# Patient Record
Sex: Female | Born: 1960 | Race: White | Hispanic: No | Marital: Married | Smoking: Former smoker
Health system: Southern US, Community
[De-identification: ages and names within clinical notes are randomized; demographics above are authoritative.]

## PROBLEM LIST (undated history)

## (undated) DIAGNOSIS — R5382 Chronic fatigue, unspecified: Secondary | ICD-10-CM

## (undated) DIAGNOSIS — N63 Unspecified lump in unspecified breast: Secondary | ICD-10-CM

## (undated) DIAGNOSIS — J40 Bronchitis, not specified as acute or chronic: Secondary | ICD-10-CM

## (undated) DIAGNOSIS — K635 Polyp of colon: Secondary | ICD-10-CM

## (undated) DIAGNOSIS — M1990 Unspecified osteoarthritis, unspecified site: Secondary | ICD-10-CM

## (undated) DIAGNOSIS — I341 Nonrheumatic mitral (valve) prolapse: Secondary | ICD-10-CM

## (undated) DIAGNOSIS — Z9989 Dependence on other enabling machines and devices: Secondary | ICD-10-CM

## (undated) DIAGNOSIS — G4733 Obstructive sleep apnea (adult) (pediatric): Secondary | ICD-10-CM

## (undated) DIAGNOSIS — Z923 Personal history of irradiation: Secondary | ICD-10-CM

## (undated) DIAGNOSIS — B269 Mumps without complication: Secondary | ICD-10-CM

## (undated) DIAGNOSIS — R011 Cardiac murmur, unspecified: Secondary | ICD-10-CM

## (undated) DIAGNOSIS — D649 Anemia, unspecified: Secondary | ICD-10-CM

## (undated) DIAGNOSIS — B059 Measles without complication: Secondary | ICD-10-CM

## (undated) DIAGNOSIS — I10 Essential (primary) hypertension: Secondary | ICD-10-CM

## (undated) DIAGNOSIS — IMO0001 Reserved for inherently not codable concepts without codable children: Secondary | ICD-10-CM

## (undated) DIAGNOSIS — K219 Gastro-esophageal reflux disease without esophagitis: Secondary | ICD-10-CM

## (undated) DIAGNOSIS — M797 Fibromyalgia: Secondary | ICD-10-CM

## (undated) DIAGNOSIS — G9332 Myalgic encephalomyelitis/chronic fatigue syndrome: Secondary | ICD-10-CM

## (undated) DIAGNOSIS — C50919 Malignant neoplasm of unspecified site of unspecified female breast: Secondary | ICD-10-CM

## (undated) DIAGNOSIS — C50419 Malignant neoplasm of upper-outer quadrant of unspecified female breast: Secondary | ICD-10-CM

## (undated) DIAGNOSIS — Z9221 Personal history of antineoplastic chemotherapy: Secondary | ICD-10-CM

## (undated) HISTORY — PX: UPPER GI ENDOSCOPY: SHX6162

## (undated) HISTORY — DX: Myalgic encephalomyelitis/chronic fatigue syndrome: G93.32

## (undated) HISTORY — DX: Essential (primary) hypertension: I10

## (undated) HISTORY — DX: Chronic fatigue, unspecified: R53.82

## (undated) HISTORY — DX: Malignant neoplasm of upper-outer quadrant of unspecified female breast: C50.419

## (undated) HISTORY — DX: Obstructive sleep apnea (adult) (pediatric): G47.33

## (undated) HISTORY — DX: Unspecified osteoarthritis, unspecified site: M19.90

## (undated) HISTORY — DX: Unspecified lump in unspecified breast: N63.0

## (undated) HISTORY — DX: Mumps without complication: B26.9

## (undated) HISTORY — DX: Cardiac murmur, unspecified: R01.1

## (undated) HISTORY — DX: Polyp of colon: K63.5

## (undated) HISTORY — DX: Fibromyalgia: M79.7

## (undated) HISTORY — PX: BACK SURGERY: SHX140

## (undated) HISTORY — DX: Dependence on other enabling machines and devices: Z99.89

## (undated) HISTORY — DX: Measles without complication: B05.9

---
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
## 1980-11-24 HISTORY — PX: TUBAL LIGATION: SHX77

## 1988-11-24 DIAGNOSIS — M1990 Unspecified osteoarthritis, unspecified site: Secondary | ICD-10-CM

## 1988-11-24 HISTORY — DX: Unspecified osteoarthritis, unspecified site: M19.90

## 1991-11-25 DIAGNOSIS — M797 Fibromyalgia: Secondary | ICD-10-CM

## 1991-11-25 HISTORY — DX: Fibromyalgia: M79.7

## 2007-04-30 ENCOUNTER — Ambulatory Visit: Payer: Self-pay | Admitting: Internal Medicine

## 2007-10-22 ENCOUNTER — Ambulatory Visit: Payer: Self-pay | Admitting: Internal Medicine

## 2007-11-25 HISTORY — PX: SHOULDER SURGERY: SHX246

## 2008-05-17 ENCOUNTER — Ambulatory Visit: Payer: Self-pay | Admitting: Internal Medicine

## 2008-06-12 ENCOUNTER — Ambulatory Visit: Payer: Self-pay | Admitting: Physician Assistant

## 2008-06-23 ENCOUNTER — Ambulatory Visit: Payer: Self-pay | Admitting: Unknown Physician Specialty

## 2008-12-05 ENCOUNTER — Ambulatory Visit: Payer: Self-pay | Admitting: Adult Health Nurse Practitioner

## 2010-05-23 ENCOUNTER — Ambulatory Visit: Payer: Self-pay | Admitting: Internal Medicine

## 2010-10-18 ENCOUNTER — Ambulatory Visit: Payer: Self-pay

## 2010-11-24 DIAGNOSIS — I10 Essential (primary) hypertension: Secondary | ICD-10-CM

## 2010-11-24 HISTORY — DX: Essential (primary) hypertension: I10

## 2011-10-02 ENCOUNTER — Ambulatory Visit: Payer: Self-pay | Admitting: Family Medicine

## 2011-10-15 ENCOUNTER — Ambulatory Visit: Payer: Self-pay | Admitting: Gastroenterology

## 2011-10-17 LAB — PATHOLOGY REPORT

## 2011-11-25 DIAGNOSIS — K635 Polyp of colon: Secondary | ICD-10-CM

## 2011-11-25 HISTORY — DX: Polyp of colon: K63.5

## 2011-11-25 HISTORY — PX: COLONOSCOPY: SHX174

## 2012-10-24 HISTORY — PX: BREAST BIOPSY: SHX20

## 2012-11-04 ENCOUNTER — Ambulatory Visit: Payer: Self-pay | Admitting: Family Medicine

## 2012-11-24 DIAGNOSIS — C50919 Malignant neoplasm of unspecified site of unspecified female breast: Secondary | ICD-10-CM

## 2012-11-24 HISTORY — PX: BREAST EXCISIONAL BIOPSY: SUR124

## 2012-11-24 HISTORY — DX: Malignant neoplasm of unspecified site of unspecified female breast: C50.919

## 2012-11-25 ENCOUNTER — Ambulatory Visit: Payer: Self-pay | Admitting: Oncology

## 2012-11-25 LAB — COMPREHENSIVE METABOLIC PANEL
Albumin: 3.8 g/dL (ref 3.4–5.0)
Alkaline Phosphatase: 144 U/L — ABNORMAL HIGH (ref 50–136)
BUN: 14 mg/dL (ref 7–18)
Bilirubin,Total: 0.3 mg/dL (ref 0.2–1.0)
Calcium, Total: 9.3 mg/dL (ref 8.5–10.1)
Chloride: 103 mmol/L (ref 98–107)
Co2: 27 mmol/L (ref 21–32)
Creatinine: 0.92 mg/dL (ref 0.60–1.30)
Potassium: 4.2 mmol/L (ref 3.5–5.1)
SGOT(AST): 25 U/L (ref 15–37)
SGPT (ALT): 30 U/L (ref 12–78)
Total Protein: 7.5 g/dL (ref 6.4–8.2)

## 2012-11-25 LAB — CBC CANCER CENTER
Eosinophil #: 0.2 x10 3/mm (ref 0.0–0.7)
HCT: 38.6 % (ref 35.0–47.0)
HGB: 13 g/dL (ref 12.0–16.0)
Lymphocyte #: 1.8 x10 3/mm (ref 1.0–3.6)
MCH: 29.8 pg (ref 26.0–34.0)
MCHC: 33.8 g/dL (ref 32.0–36.0)
Monocyte %: 7.2 %
Platelet: 221 x10 3/mm (ref 150–440)
RBC: 4.37 10*6/uL (ref 3.80–5.20)
RDW: 13.8 % (ref 11.5–14.5)
WBC: 6.5 x10 3/mm (ref 3.6–11.0)

## 2012-11-30 ENCOUNTER — Ambulatory Visit: Payer: Self-pay | Admitting: General Surgery

## 2012-12-06 ENCOUNTER — Ambulatory Visit: Payer: Self-pay | Admitting: General Surgery

## 2012-12-06 HISTORY — PX: BREAST LUMPECTOMY: SHX2

## 2012-12-16 LAB — CBC CANCER CENTER
Basophil #: 0 x10 3/mm (ref 0.0–0.1)
Basophil %: 0.6 %
Eosinophil #: 0.1 x10 3/mm (ref 0.0–0.7)
HCT: 36 % (ref 35.0–47.0)
MCHC: 34 g/dL (ref 32.0–36.0)
MCV: 88 fL (ref 80–100)
Monocyte #: 0.4 x10 3/mm (ref 0.2–0.9)
Neutrophil #: 4.1 x10 3/mm (ref 1.4–6.5)
Neutrophil %: 69.7 %
Platelet: 211 x10 3/mm (ref 150–440)
RDW: 13.7 % (ref 11.5–14.5)
WBC: 5.9 x10 3/mm (ref 3.6–11.0)

## 2012-12-16 LAB — COMPREHENSIVE METABOLIC PANEL
Anion Gap: 6 — ABNORMAL LOW (ref 7–16)
BUN: 16 mg/dL (ref 7–18)
Bilirubin,Total: 0.3 mg/dL (ref 0.2–1.0)
Calcium, Total: 8.6 mg/dL (ref 8.5–10.1)
Chloride: 105 mmol/L (ref 98–107)
Co2: 29 mmol/L (ref 21–32)
Creatinine: 0.93 mg/dL (ref 0.60–1.30)
EGFR (African American): >60
EGFR (Non-African Amer.): >60
Glucose: 117 mg/dL — ABNORMAL HIGH (ref 65–99)
Osmolality: 282 (ref 275–301)
Potassium: 4.2 mmol/L (ref 3.5–5.1)
SGOT(AST): 22 U/L (ref 15–37)
SGPT (ALT): 28 U/L (ref 12–78)
Sodium: 140 mmol/L (ref 136–145)
Total Protein: 7 g/dL (ref 6.4–8.2)

## 2012-12-23 LAB — CBC CANCER CENTER
Eosinophil #: 0.1 x10 3/mm (ref 0.0–0.7)
Lymphocyte #: 0.4 x10 3/mm — ABNORMAL LOW (ref 1.0–3.6)
Lymphocyte %: 68.9 %
MCV: 87 fL (ref 80–100)
Monocyte #: 0.1 x10 3/mm — ABNORMAL LOW (ref 0.2–0.9)
Monocyte %: 15.3 %
Neutrophil #: 0 x10 3/mm — ABNORMAL LOW (ref 1.4–6.5)
RBC: 3.95 10*6/uL (ref 3.80–5.20)

## 2012-12-25 ENCOUNTER — Ambulatory Visit: Payer: Self-pay | Admitting: Oncology

## 2012-12-30 LAB — CBC CANCER CENTER
Basophil #: 0 x10 3/mm (ref 0.0–0.1)
Eosinophil #: 0 x10 3/mm (ref 0.0–0.7)
HCT: 34.5 % — ABNORMAL LOW (ref 35.0–47.0)
HGB: 11.5 g/dL — ABNORMAL LOW (ref 12.0–16.0)
Lymphocyte #: 1.6 x10 3/mm (ref 1.0–3.6)
Lymphocyte %: 18.9 %
MCH: 29.2 pg (ref 26.0–34.0)
MCHC: 33.4 g/dL (ref 32.0–36.0)
MCV: 87 fL (ref 80–100)
Neutrophil #: 6.2 x10 3/mm (ref 1.4–6.5)
Neutrophil %: 71.8 %
RBC: 3.95 10*6/uL (ref 3.80–5.20)

## 2013-01-07 LAB — BASIC METABOLIC PANEL
Anion Gap: 11 (ref 7–16)
Calcium, Total: 8.6 mg/dL (ref 8.5–10.1)
Chloride: 101 mmol/L (ref 98–107)
Co2: 27 mmol/L (ref 21–32)
Creatinine: 0.97 mg/dL (ref 0.60–1.30)
EGFR (African American): >60
EGFR (Non-African Amer.): >60
Glucose: 130 mg/dL — ABNORMAL HIGH (ref 65–99)
Osmolality: 280 (ref 275–301)
Potassium: 3.9 mmol/L (ref 3.5–5.1)
Sodium: 139 mmol/L (ref 136–145)

## 2013-01-07 LAB — CBC CANCER CENTER
Basophil #: 0 x10 3/mm (ref 0.0–0.1)
Basophil %: 0.7 %
Lymphocyte #: 1 x10 3/mm (ref 1.0–3.6)
MCH: 29.8 pg (ref 26.0–34.0)
MCHC: 34 g/dL (ref 32.0–36.0)
Monocyte #: 0.7 x10 3/mm (ref 0.2–0.9)
Neutrophil %: 75.6 %
Platelet: 331 x10 3/mm (ref 150–440)
RBC: 3.75 10*6/uL — ABNORMAL LOW (ref 3.80–5.20)
RDW: 14.4 % (ref 11.5–14.5)
WBC: 7.3 x10 3/mm (ref 3.6–11.0)

## 2013-01-17 LAB — CBC CANCER CENTER
Basophil #: 0 x10 3/mm (ref 0.0–0.1)
Basophil %: 1.5 %
Eosinophil #: 0 x10 3/mm (ref 0.0–0.7)
HGB: 10.5 g/dL — ABNORMAL LOW (ref 12.0–16.0)
Lymphocyte %: 74.1 %
Monocyte #: 0 x10 3/mm — ABNORMAL LOW (ref 0.2–0.9)
Neutrophil #: 0 x10 3/mm — ABNORMAL LOW (ref 1.4–6.5)
Neutrophil %: 1.4 %
Platelet: 127 x10 3/mm — ABNORMAL LOW (ref 150–440)
RDW: 14.2 % (ref 11.5–14.5)
WBC: 0.4 x10 3/mm — CL (ref 3.6–11.0)

## 2013-01-21 LAB — CBC CANCER CENTER
Basophil #: 0 x10 3/mm (ref 0.0–0.1)
Eosinophil #: 0 x10 3/mm (ref 0.0–0.7)
HCT: 30.2 % — ABNORMAL LOW (ref 35.0–47.0)
HGB: 10.3 g/dL — ABNORMAL LOW (ref 12.0–16.0)
Lymphocyte #: 1.4 x10 3/mm (ref 1.0–3.6)
Lymphocyte %: 15.8 %
MCH: 29.6 pg (ref 26.0–34.0)
MCHC: 34.1 g/dL (ref 32.0–36.0)
MCV: 87 fL (ref 80–100)
Monocyte #: 0.8 x10 3/mm (ref 0.2–0.9)
Monocyte %: 9.2 %
Neutrophil %: 74.4 %
RBC: 3.47 10*6/uL — ABNORMAL LOW (ref 3.80–5.20)
RDW: 14 % (ref 11.5–14.5)

## 2013-01-22 ENCOUNTER — Ambulatory Visit: Payer: Self-pay | Admitting: Oncology

## 2013-01-27 LAB — CBC CANCER CENTER
Basophil #: 0 x10 3/mm (ref 0.0–0.1)
Basophil %: 0.7 %
Eosinophil %: 0.1 %
HGB: 10.5 g/dL — ABNORMAL LOW (ref 12.0–16.0)
Lymphocyte #: 1 x10 3/mm (ref 1.0–3.6)
Lymphocyte %: 20.7 %
MCH: 29.3 pg (ref 26.0–34.0)
MCV: 88 fL (ref 80–100)
Monocyte %: 12.5 %
Neutrophil #: 3.2 x10 3/mm (ref 1.4–6.5)
Neutrophil %: 66 %
Platelet: 326 x10 3/mm (ref 150–440)
RBC: 3.57 10*6/uL — ABNORMAL LOW (ref 3.80–5.20)
WBC: 4.8 x10 3/mm (ref 3.6–11.0)

## 2013-01-27 LAB — BASIC METABOLIC PANEL
Calcium, Total: 8.7 mg/dL (ref 8.5–10.1)
Chloride: 105 mmol/L (ref 98–107)
Co2: 25 mmol/L (ref 21–32)
Creatinine: 0.81 mg/dL (ref 0.60–1.30)
EGFR (Non-African Amer.): >60
Glucose: 125 mg/dL — ABNORMAL HIGH (ref 65–99)
Osmolality: 278 (ref 275–301)
Sodium: 139 mmol/L (ref 136–145)

## 2013-02-03 LAB — CBC CANCER CENTER
Basophil #: 0 x10 3/mm (ref 0.0–0.1)
Basophil %: 1.3 %
Eosinophil #: 0 x10 3/mm (ref 0.0–0.7)
Eosinophil %: 2.6 %
HCT: 28.2 % — ABNORMAL LOW (ref 35.0–47.0)
HGB: 9.8 g/dL — ABNORMAL LOW (ref 12.0–16.0)
Lymphocyte #: 0.3 x10 3/mm — ABNORMAL LOW (ref 1.0–3.6)
Lymphocyte %: 76.4 %
MCH: 30.2 pg (ref 26.0–34.0)
MCHC: 34.7 g/dL (ref 32.0–36.0)
MCV: 87 fL (ref 80–100)
Monocyte #: 0 x10 3/mm — ABNORMAL LOW (ref 0.2–0.9)
Monocyte %: 14.2 %
Neutrophil #: 0 x10 3/mm — ABNORMAL LOW (ref 1.4–6.5)
Neutrophil %: 5.5 %
Platelet: 105 x10 3/mm — ABNORMAL LOW (ref 150–440)
RBC: 3.24 10*6/uL — ABNORMAL LOW (ref 3.80–5.20)
RDW: 16 % — ABNORMAL HIGH (ref 11.5–14.5)
WBC: 0.3 x10 3/mm — CL (ref 3.6–11.0)

## 2013-02-10 LAB — CBC CANCER CENTER
Basophil #: 0 x10 3/mm (ref 0.0–0.1)
Basophil %: 0.4 %
Eosinophil #: 0 x10 3/mm (ref 0.0–0.7)
HGB: 9.7 g/dL — ABNORMAL LOW (ref 12.0–16.0)
Lymphocyte #: 1.3 x10 3/mm (ref 1.0–3.6)
Lymphocyte %: 11.7 %
MCHC: 34.3 g/dL (ref 32.0–36.0)
Monocyte #: 1.1 x10 3/mm — ABNORMAL HIGH (ref 0.2–0.9)
Neutrophil #: 8.3 x10 3/mm — ABNORMAL HIGH (ref 1.4–6.5)
Neutrophil %: 77.7 %
Platelet: 148 x10 3/mm — ABNORMAL LOW (ref 150–440)
RBC: 3.21 10*6/uL — ABNORMAL LOW (ref 3.80–5.20)
WBC: 10.7 x10 3/mm (ref 3.6–11.0)

## 2013-02-17 LAB — CBC CANCER CENTER
Basophil %: 1 %
Eosinophil %: 0.4 %
HCT: 28.8 % — ABNORMAL LOW (ref 35.0–47.0)
HGB: 10 g/dL — ABNORMAL LOW (ref 12.0–16.0)
Lymphocyte #: 0.7 x10 3/mm — ABNORMAL LOW (ref 1.0–3.6)
MCH: 30.8 pg (ref 26.0–34.0)
MCHC: 34.6 g/dL (ref 32.0–36.0)
MCV: 89 fL (ref 80–100)
Monocyte #: 0.6 x10 3/mm (ref 0.2–0.9)
Neutrophil %: 67.2 %
RBC: 3.23 10*6/uL — ABNORMAL LOW (ref 3.80–5.20)
WBC: 4.2 x10 3/mm (ref 3.6–11.0)

## 2013-02-17 LAB — BASIC METABOLIC PANEL
Anion Gap: 6 — ABNORMAL LOW (ref 7–16)
Co2: 29 mmol/L (ref 21–32)
Creatinine: 0.96 mg/dL (ref 0.60–1.30)
EGFR (African American): >60
Glucose: 96 mg/dL (ref 65–99)
Osmolality: 278 (ref 275–301)
Potassium: 4.2 mmol/L (ref 3.5–5.1)

## 2013-02-22 ENCOUNTER — Ambulatory Visit: Payer: Self-pay | Admitting: Oncology

## 2013-02-24 LAB — CBC CANCER CENTER
Eosinophil #: 0 x10 3/mm (ref 0.0–0.7)
Eosinophil %: 4.4 %
HCT: 25.6 % — ABNORMAL LOW (ref 35.0–47.0)
Lymphocyte %: 66 %
Monocyte #: 0 x10 3/mm — ABNORMAL LOW (ref 0.2–0.9)
Neutrophil #: 0 x10 3/mm — ABNORMAL LOW (ref 1.4–6.5)
Neutrophil %: 6.6 %
Platelet: 83 x10 3/mm — ABNORMAL LOW (ref 150–440)
RBC: 2.84 10*6/uL — ABNORMAL LOW (ref 3.80–5.20)
RDW: 18.6 % — ABNORMAL HIGH (ref 11.5–14.5)
WBC: 0.2 x10 3/mm — CL (ref 3.6–11.0)

## 2013-03-03 LAB — CBC CANCER CENTER
Basophil #: 0 x10 3/mm (ref 0.0–0.1)
Eosinophil %: 0.1 %
HCT: 24.2 % — ABNORMAL LOW (ref 35.0–47.0)
HGB: 8.1 g/dL — ABNORMAL LOW (ref 12.0–16.0)
Lymphocyte %: 9.3 %
MCH: 30.9 pg (ref 26.0–34.0)
MCHC: 33.6 g/dL (ref 32.0–36.0)
MCV: 92 fL (ref 80–100)
Monocyte #: 0.8 x10 3/mm (ref 0.2–0.9)
Neutrophil #: 7 x10 3/mm — ABNORMAL HIGH (ref 1.4–6.5)
Neutrophil %: 80.8 %
Platelet: 147 x10 3/mm — ABNORMAL LOW (ref 150–440)
WBC: 8.6 x10 3/mm (ref 3.6–11.0)

## 2013-03-10 LAB — CBC CANCER CENTER
Basophil #: 0 x10 3/mm (ref 0.0–0.1)
Eosinophil #: 0 x10 3/mm (ref 0.0–0.7)
Eosinophil %: 0.1 %
HGB: 8.8 g/dL — ABNORMAL LOW (ref 12.0–16.0)
Lymphocyte #: 0.5 x10 3/mm — ABNORMAL LOW (ref 1.0–3.6)
Lymphocyte %: 11.2 %
MCH: 31.2 pg (ref 26.0–34.0)
MCHC: 33.3 g/dL (ref 32.0–36.0)
Monocyte #: 0.6 x10 3/mm (ref 0.2–0.9)
Monocyte %: 13.3 %
Neutrophil #: 3.3 x10 3/mm (ref 1.4–6.5)
Neutrophil %: 74.7 %
Platelet: 279 x10 3/mm (ref 150–440)
RBC: 2.81 10*6/uL — ABNORMAL LOW (ref 3.80–5.20)
RDW: 21.4 % — ABNORMAL HIGH (ref 11.5–14.5)

## 2013-03-10 LAB — COMPREHENSIVE METABOLIC PANEL
Albumin: 3.3 g/dL — ABNORMAL LOW (ref 3.4–5.0)
Anion Gap: 9 (ref 7–16)
BUN: 8 mg/dL (ref 7–18)
Bilirubin,Total: 0.3 mg/dL (ref 0.2–1.0)
Chloride: 104 mmol/L (ref 98–107)
Co2: 28 mmol/L (ref 21–32)
Creatinine: 1.09 mg/dL (ref 0.60–1.30)
EGFR (African American): >60
Glucose: 163 mg/dL — ABNORMAL HIGH (ref 65–99)
Osmolality: 283 (ref 275–301)
Potassium: 3.6 mmol/L (ref 3.5–5.1)
Sodium: 141 mmol/L (ref 136–145)
Total Protein: 6.5 g/dL (ref 6.4–8.2)

## 2013-03-17 LAB — IRON AND TIBC
Iron Bind.Cap.(Total): 264 ug/dL (ref 250–450)
Iron Saturation: 25 %
Iron: 67 ug/dL (ref 50–170)
Unbound Iron-Bind.Cap.: 197 ug/dL

## 2013-03-17 LAB — COMPREHENSIVE METABOLIC PANEL
Albumin: 3.2 g/dL — ABNORMAL LOW (ref 3.4–5.0)
Anion Gap: 4 — ABNORMAL LOW (ref 7–16)
BUN: 13 mg/dL (ref 7–18)
Bilirubin,Total: 0.4 mg/dL (ref 0.2–1.0)
Chloride: 104 mmol/L (ref 98–107)
Co2: 28 mmol/L (ref 21–32)
Creatinine: 0.8 mg/dL (ref 0.60–1.30)
EGFR (Non-African Amer.): >60
Glucose: 90 mg/dL (ref 65–99)
Osmolality: 272 (ref 275–301)
Potassium: 3.9 mmol/L (ref 3.5–5.1)
SGPT (ALT): 57 U/L (ref 12–78)
Sodium: 136 mmol/L (ref 136–145)

## 2013-03-17 LAB — CBC CANCER CENTER
Basophil #: 0 x10 3/mm (ref 0.0–0.1)
Basophil %: 0.4 %
Eosinophil #: 0.1 x10 3/mm (ref 0.0–0.7)
Lymphocyte %: 13.1 %
MCHC: 34.4 g/dL (ref 32.0–36.0)
Monocyte #: 0.5 x10 3/mm (ref 0.2–0.9)
Monocyte %: 11 %
Neutrophil #: 3.3 x10 3/mm (ref 1.4–6.5)
Platelet: 235 x10 3/mm (ref 150–440)
WBC: 4.6 x10 3/mm (ref 3.6–11.0)

## 2013-03-17 LAB — FERRITIN: Ferritin (ARMC): 228 ng/mL (ref 8–388)

## 2013-03-24 ENCOUNTER — Ambulatory Visit: Payer: Self-pay | Admitting: Oncology

## 2013-03-24 LAB — CBC CANCER CENTER
Basophil #: 0 x10 3/mm (ref 0.0–0.1)
Eosinophil #: 0.3 x10 3/mm (ref 0.0–0.7)
MCH: 32.2 pg (ref 26.0–34.0)
MCHC: 33.6 g/dL (ref 32.0–36.0)
Monocyte #: 0.4 x10 3/mm (ref 0.2–0.9)
Monocyte %: 8.2 %
Neutrophil #: 3.1 x10 3/mm (ref 1.4–6.5)
Platelet: 212 x10 3/mm (ref 150–440)
RBC: 2.85 10*6/uL — ABNORMAL LOW (ref 3.80–5.20)
RDW: 19.5 % — ABNORMAL HIGH (ref 11.5–14.5)
WBC: 4.3 x10 3/mm (ref 3.6–11.0)

## 2013-03-31 LAB — CBC CANCER CENTER
Basophil %: 0.7 %
Eosinophil #: 0.2 x10 3/mm (ref 0.0–0.7)
Eosinophil %: 5.2 %
HGB: 9.3 g/dL — ABNORMAL LOW (ref 12.0–16.0)
Lymphocyte %: 14.1 %
Monocyte #: 0.4 x10 3/mm (ref 0.2–0.9)
Monocyte %: 9.1 %
Neutrophil #: 2.8 x10 3/mm (ref 1.4–6.5)
Platelet: 220 x10 3/mm (ref 150–440)
RBC: 2.83 10*6/uL — ABNORMAL LOW (ref 3.80–5.20)
RDW: 18.4 % — ABNORMAL HIGH (ref 11.5–14.5)

## 2013-04-07 LAB — CBC CANCER CENTER
Basophil %: 0.8 %
Eosinophil #: 0.1 x10 3/mm (ref 0.0–0.7)
Eosinophil %: 4.1 %
HGB: 9.1 g/dL — ABNORMAL LOW (ref 12.0–16.0)
Lymphocyte #: 0.6 x10 3/mm — ABNORMAL LOW (ref 1.0–3.6)
Lymphocyte %: 16.1 %
MCV: 96 fL (ref 80–100)
Monocyte #: 0.4 x10 3/mm (ref 0.2–0.9)
Monocyte %: 10.1 %
Neutrophil #: 2.4 x10 3/mm (ref 1.4–6.5)
RBC: 2.82 10*6/uL — ABNORMAL LOW (ref 3.80–5.20)
RDW: 17.4 % — ABNORMAL HIGH (ref 11.5–14.5)

## 2013-04-14 LAB — CBC CANCER CENTER
Basophil #: 0 x10 3/mm (ref 0.0–0.1)
Basophil %: 0.7 %
Eosinophil #: 0.1 x10 3/mm (ref 0.0–0.7)
HCT: 29.1 % — ABNORMAL LOW (ref 35.0–47.0)
HGB: 9.8 g/dL — ABNORMAL LOW (ref 12.0–16.0)
MCH: 32.2 pg (ref 26.0–34.0)
MCHC: 33.5 g/dL (ref 32.0–36.0)
Monocyte #: 0.3 x10 3/mm (ref 0.2–0.9)
Neutrophil #: 2.3 x10 3/mm (ref 1.4–6.5)
WBC: 3.3 x10 3/mm — ABNORMAL LOW (ref 3.6–11.0)

## 2013-04-21 LAB — CBC CANCER CENTER
Basophil #: 0 x10 3/mm (ref 0.0–0.1)
Basophil %: 0.5 %
Eosinophil #: 0.1 x10 3/mm (ref 0.0–0.7)
HCT: 29.7 % — ABNORMAL LOW (ref 35.0–47.0)
HGB: 10.1 g/dL — ABNORMAL LOW (ref 12.0–16.0)
Lymphocyte #: 0.6 x10 3/mm — ABNORMAL LOW (ref 1.0–3.6)
Lymphocyte %: 17.3 %
MCH: 32.7 pg (ref 26.0–34.0)
MCV: 96 fL (ref 80–100)
Monocyte #: 0.3 x10 3/mm (ref 0.2–0.9)
Platelet: 242 x10 3/mm (ref 150–440)
RBC: 3.1 10*6/uL — ABNORMAL LOW (ref 3.80–5.20)
WBC: 3.4 x10 3/mm — ABNORMAL LOW (ref 3.6–11.0)

## 2013-04-24 ENCOUNTER — Ambulatory Visit: Payer: Self-pay | Admitting: Oncology

## 2013-04-28 LAB — CBC CANCER CENTER
Basophil %: 0.6 %
Eosinophil #: 0.1 x10 3/mm (ref 0.0–0.7)
HCT: 28.7 % — ABNORMAL LOW (ref 35.0–47.0)
HGB: 9.9 g/dL — ABNORMAL LOW (ref 12.0–16.0)
Lymphocyte #: 0.6 x10 3/mm — ABNORMAL LOW (ref 1.0–3.6)
MCV: 95 fL (ref 80–100)
Monocyte #: 0.3 x10 3/mm (ref 0.2–0.9)
Monocyte %: 9.4 %
Neutrophil #: 2.3 x10 3/mm (ref 1.4–6.5)
Neutrophil %: 69.2 %
Platelet: 228 x10 3/mm (ref 150–440)
RBC: 3.01 10*6/uL — ABNORMAL LOW (ref 3.80–5.20)
RDW: 16.2 % — ABNORMAL HIGH (ref 11.5–14.5)
WBC: 3.4 x10 3/mm — ABNORMAL LOW (ref 3.6–11.0)

## 2013-04-28 LAB — COMPREHENSIVE METABOLIC PANEL
Albumin: 3.4 g/dL (ref 3.4–5.0)
BUN: 10 mg/dL (ref 7–18)
Bilirubin,Total: 0.3 mg/dL (ref 0.2–1.0)
Co2: 26 mmol/L (ref 21–32)
EGFR (African American): >60
EGFR (Non-African Amer.): >60
Glucose: 98 mg/dL (ref 65–99)
Potassium: 3.9 mmol/L (ref 3.5–5.1)
SGPT (ALT): 41 U/L (ref 12–78)
Sodium: 140 mmol/L (ref 136–145)

## 2013-05-05 LAB — CBC CANCER CENTER
HCT: 29.6 % — ABNORMAL LOW (ref 35.0–47.0)
HGB: 10.3 g/dL — ABNORMAL LOW (ref 12.0–16.0)
Lymphocyte #: 0.6 x10 3/mm — ABNORMAL LOW (ref 1.0–3.6)
MCH: 32.9 pg (ref 26.0–34.0)
MCV: 95 fL (ref 80–100)
Monocyte #: 0.4 x10 3/mm (ref 0.2–0.9)
Monocyte %: 9.7 %
Platelet: 240 x10 3/mm (ref 150–440)
RBC: 3.13 10*6/uL — ABNORMAL LOW (ref 3.80–5.20)

## 2013-05-12 LAB — COMPREHENSIVE METABOLIC PANEL
Albumin: 3.5 g/dL (ref 3.4–5.0)
Alkaline Phosphatase: 125 U/L (ref 50–136)
Anion Gap: 5 — ABNORMAL LOW (ref 7–16)
BUN: 11 mg/dL (ref 7–18)
Bilirubin,Total: 0.3 mg/dL (ref 0.2–1.0)
Calcium, Total: 8.9 mg/dL (ref 8.5–10.1)
Creatinine: 0.96 mg/dL (ref 0.60–1.30)
EGFR (African American): >60
Osmolality: 283 (ref 275–301)
Potassium: 3.6 mmol/L (ref 3.5–5.1)
SGOT(AST): 26 U/L (ref 15–37)
SGPT (ALT): 34 U/L (ref 12–78)
Sodium: 141 mmol/L (ref 136–145)
Total Protein: 6.7 g/dL (ref 6.4–8.2)

## 2013-05-12 LAB — CBC CANCER CENTER
Eosinophil #: 0.1 x10 3/mm (ref 0.0–0.7)
Eosinophil %: 2.6 %
HGB: 10.3 g/dL — ABNORMAL LOW (ref 12.0–16.0)
Lymphocyte %: 13.2 %
MCH: 32 pg (ref 26.0–34.0)
MCHC: 34.2 g/dL (ref 32.0–36.0)
Monocyte %: 7.4 %
Neutrophil %: 76.4 %
Platelet: 253 x10 3/mm (ref 150–440)
RBC: 3.23 10*6/uL — ABNORMAL LOW (ref 3.80–5.20)
WBC: 4.5 x10 3/mm (ref 3.6–11.0)

## 2013-05-19 LAB — CBC CANCER CENTER
Basophil #: 0 x10 3/mm (ref 0.0–0.1)
Eosinophil #: 0.1 x10 3/mm (ref 0.0–0.7)
HGB: 10.4 g/dL — ABNORMAL LOW (ref 12.0–16.0)
Lymphocyte #: 0.7 x10 3/mm — ABNORMAL LOW (ref 1.0–3.6)
MCH: 32.6 pg (ref 26.0–34.0)
MCV: 93 fL (ref 80–100)
Monocyte %: 9.2 %
Neutrophil %: 74.6 %
Platelet: 274 x10 3/mm (ref 150–440)
RDW: 16 % — ABNORMAL HIGH (ref 11.5–14.5)
WBC: 5 x10 3/mm (ref 3.6–11.0)

## 2013-05-24 ENCOUNTER — Ambulatory Visit: Payer: Self-pay | Admitting: Oncology

## 2013-05-24 ENCOUNTER — Ambulatory Visit: Payer: Self-pay | Admitting: General Surgery

## 2013-05-26 ENCOUNTER — Encounter: Payer: Self-pay | Admitting: General Surgery

## 2013-06-02 LAB — CBC CANCER CENTER
Basophil #: 0 x10 3/mm (ref 0.0–0.1)
Basophil %: 0.7 %
HCT: 31.2 % — ABNORMAL LOW (ref 35.0–47.0)
Lymphocyte %: 13.9 %
MCHC: 35.1 g/dL (ref 32.0–36.0)
Monocyte #: 0.6 x10 3/mm (ref 0.2–0.9)
Neutrophil %: 73.9 %
Platelet: 276 x10 3/mm (ref 150–440)
RDW: 15.7 % — ABNORMAL HIGH (ref 11.5–14.5)

## 2013-06-02 LAB — COMPREHENSIVE METABOLIC PANEL
Albumin: 3.3 g/dL — ABNORMAL LOW (ref 3.4–5.0)
Alkaline Phosphatase: 147 U/L — ABNORMAL HIGH (ref 50–136)
BUN: 13 mg/dL (ref 7–18)
Bilirubin,Total: 0.3 mg/dL (ref 0.2–1.0)
Calcium, Total: 8.8 mg/dL (ref 8.5–10.1)
Chloride: 104 mmol/L (ref 98–107)
Co2: 28 mmol/L (ref 21–32)
EGFR (Non-African Amer.): >60
Glucose: 109 mg/dL — ABNORMAL HIGH (ref 65–99)
Osmolality: 282 (ref 275–301)
Sodium: 141 mmol/L (ref 136–145)

## 2013-06-03 ENCOUNTER — Encounter: Payer: Self-pay | Admitting: *Deleted

## 2013-06-03 LAB — CANCER ANTIGEN 27.29: CA 27.29: 39.9 U/mL — ABNORMAL HIGH (ref 0.0–38.6)

## 2013-06-13 ENCOUNTER — Ambulatory Visit (INDEPENDENT_AMBULATORY_CARE_PROVIDER_SITE_OTHER): Payer: BC Managed Care – PPO | Admitting: General Surgery

## 2013-06-13 ENCOUNTER — Encounter: Payer: Self-pay | Admitting: General Surgery

## 2013-06-13 VITALS — BP 122/82 | HR 74 | Resp 16 | Ht 61.0 in | Wt 160.0 lb

## 2013-06-13 DIAGNOSIS — C50412 Malignant neoplasm of upper-outer quadrant of left female breast: Secondary | ICD-10-CM

## 2013-06-13 DIAGNOSIS — C50419 Malignant neoplasm of upper-outer quadrant of unspecified female breast: Secondary | ICD-10-CM

## 2013-06-13 NOTE — Progress Notes (Signed)
Patient ID: Deanna Baker, female   DOB: 03-21-1961, 52 y.o.   MRN: [id number]  Chief Complaint  Patient presents with  . Other    mammogram    HPI Deanna Baker is a 52 y.o. female who presents for a breast evaluation. The most recent mammogram was done on 05/26/13 cat 3. Patient does perform regular self breast checks and gets regular mammograms done.  No new problems with the breasts at this time. She has just completed chemo and is due to start radiation of the left breast.   HPI  Past Medical History  Diagnosis Date  . Fibromyalgia 1993  . Arthritis 1990  . Hypertension 2012  . Heart murmur   . Measles 1960  . Lump or mass in breast   . Mumps 1970  . Chronic fatigue syndrome   . Colon polyp 2013  . 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 Upper gi endoscopy      Dr. Tami Lin  . Shoulder surgery Left 2009  . Breast lumpectomy Left 12/06/12     lumpectomy with SN biopsy and power port placement  . Colonoscopy  2013    Dr. Tami Lin   . Tubal ligation  1982  . Tonsillectomy  1965    Family History  Problem Relation Age of Onset  . Breast cancer Mother     Social History History  Substance Use Topics  . Smoking status: Former Smoker -- 1.00 packs/day for 20 years    Types: Cigarettes  . Smokeless tobacco: Never Used  . Alcohol Use: Yes    No Known Allergies  Current Outpatient Prescriptions  Medication Sig Dispense Refill  . ALPRAZolam (XANAX) 0.25 MG tablet Take 0.25 mg by mouth at bedtime as needed for sleep.      Marland Kitchen amLODipine (NORVASC) 10 MG tablet Take 10 mg by mouth daily.      Marland Kitchen amphetamine-dextroamphetamine (ADDERALL) 30 MG tablet Take 30 mg by mouth daily.      . ARIPiprazole (ABILIFY) 2 MG tablet Take 2 mg by mouth daily.      Tery Sanfilippo Calcium (STOOL SOFTENER PO) Take 1 tablet by mouth as needed.      Marland Kitchen escitalopram (LEXAPRO) 10  MG tablet Take 10 mg by mouth daily.      Marland Kitchen esomeprazole (NEXIUM) 40 MG capsule Take 40 mg by mouth daily before breakfast.      . gabapentin (NEURONTIN) 400 MG capsule Take 400 mg by mouth 3 (three) times daily.      . Iron-Vitamin C (IRON 100/C PO) Take 1 tablet by mouth daily.      . meloxicam (MOBIC) 15 MG tablet Take 15 mg by mouth daily.      . traMADol (ULTRAM) 50 MG tablet Take 50 mg by mouth every 6 (six) hours as needed for pain.       No current facility-administered medications for this visit.    Review of Systems Review of Systems  Constitutional: Negative.   Respiratory: Negative.   Cardiovascular: Negative.     Blood pressure 122/82, pulse 74, resp. rate 16, height 5\' 1"  (1.549 m), weight 160 lb (72.576 kg).  Physical Exam Physical Exam  Constitutional: She is oriented to person, place, and time. She appears well-developed and well-nourished.  Eyes: Conjunctivae are normal. No scleral icterus.  Neck: Trachea normal. No thyromegaly present.  Cardiovascular: Normal rate,  regular rhythm, normal heart sounds and normal pulses.   No murmur heard. Pulmonary/Chest: Effort normal and breath sounds normal. Right breast exhibits no inverted nipple, no mass, no nipple discharge, no skin change and no tenderness. Left breast exhibits no inverted nipple, no mass, no nipple discharge, no skin change and no tenderness.  Abdominal: Soft. Normal appearance and bowel sounds are normal. There is no hepatosplenomegaly. There is no tenderness. No hernia.  Lymphadenopathy:    She has no cervical adenopathy.    She has no axillary adenopathy.  Neurological: She is alert and oriented to person, place, and time.  Skin: Skin is warm and dry.    Data Reviewed Mammogram reviewed post surgical changes.  Assessment   stable exam. Left breast triple neg cancer completing treatment    Plan    6 month follow up. Bilateral diagnostic mammogram.        SANKAR,SEEPLAPUTHUR G 06/14/2013,  7:39 PM

## 2013-06-13 NOTE — Patient Instructions (Addendum)
Patient to return in 6 months with bilateral diagnostic mammogram @ [facility].

## 2013-06-14 ENCOUNTER — Encounter: Payer: Self-pay | Admitting: General Surgery

## 2013-06-24 ENCOUNTER — Ambulatory Visit: Payer: Self-pay | Admitting: Oncology

## 2013-07-13 LAB — CBC CANCER CENTER
Basophil #: 0 x10 3/mm (ref 0.0–0.1)
Basophil %: 0.5 %
HCT: 37 % (ref 35.0–47.0)
Lymphocyte #: 1.2 x10 3/mm (ref 1.0–3.6)
Lymphocyte %: 19.8 %
MCH: 30.8 pg (ref 26.0–34.0)
MCHC: 34.1 g/dL (ref 32.0–36.0)
MCV: 90 fL (ref 80–100)
Neutrophil %: 70 %
Platelet: 256 x10 3/mm (ref 150–440)
RDW: 14.2 % (ref 11.5–14.5)
WBC: 6.3 x10 3/mm (ref 3.6–11.0)

## 2013-07-20 LAB — CBC CANCER CENTER
Basophil %: 0.5 %
Eosinophil #: 0.1 x10 3/mm (ref 0.0–0.7)
Eosinophil %: 1.9 %
HCT: 34.3 % — ABNORMAL LOW (ref 35.0–47.0)
HGB: 12 g/dL (ref 12.0–16.0)
MCH: 31.6 pg (ref 26.0–34.0)
Monocyte #: 0.3 x10 3/mm (ref 0.2–0.9)
Neutrophil #: 3.6 x10 3/mm (ref 1.4–6.5)
Neutrophil %: 74.2 %
Platelet: 231 x10 3/mm (ref 150–440)

## 2013-07-25 ENCOUNTER — Ambulatory Visit: Payer: Self-pay | Admitting: Oncology

## 2013-07-27 LAB — CBC CANCER CENTER
Basophil #: 0 x10 3/mm (ref 0.0–0.1)
Eosinophil #: 0.1 x10 3/mm (ref 0.0–0.7)
Eosinophil %: 1.9 %
HCT: 34.9 % — ABNORMAL LOW (ref 35.0–47.0)
Lymphocyte %: 16 %
MCH: 30.6 pg (ref 26.0–34.0)
MCHC: 33.9 g/dL (ref 32.0–36.0)
MCV: 91 fL (ref 80–100)
Monocyte #: 0.4 x10 3/mm (ref 0.2–0.9)
Monocyte %: 8.1 %
Neutrophil #: 4 x10 3/mm (ref 1.4–6.5)
Neutrophil %: 73.5 %
WBC: 5.5 x10 3/mm (ref 3.6–11.0)

## 2013-08-03 LAB — CBC CANCER CENTER
Basophil #: 0 x10 3/mm (ref 0.0–0.1)
Eosinophil #: 0.1 x10 3/mm (ref 0.0–0.7)
HCT: 33.5 % — ABNORMAL LOW (ref 35.0–47.0)
HGB: 11.6 g/dL — ABNORMAL LOW (ref 12.0–16.0)
Lymphocyte #: 0.7 x10 3/mm — ABNORMAL LOW (ref 1.0–3.6)
MCH: 31 pg (ref 26.0–34.0)
MCHC: 34.7 g/dL (ref 32.0–36.0)
MCV: 89 fL (ref 80–100)
Monocyte #: 0.4 x10 3/mm (ref 0.2–0.9)
Monocyte %: 8.9 %
Neutrophil #: 3.7 x10 3/mm (ref 1.4–6.5)
Neutrophil %: 73.9 %
Platelet: 214 x10 3/mm (ref 150–440)
WBC: 5 x10 3/mm (ref 3.6–11.0)

## 2013-08-04 LAB — CBC CANCER CENTER
Basophil #: 0 x10 3/mm (ref 0.0–0.1)
HCT: 35.2 % (ref 35.0–47.0)
HGB: 11.9 g/dL — ABNORMAL LOW (ref 12.0–16.0)
Lymphocyte %: 15.3 %
MCH: 30.5 pg (ref 26.0–34.0)
MCV: 90 fL (ref 80–100)
Monocyte #: 0.3 x10 3/mm (ref 0.2–0.9)
Neutrophil #: 2.8 x10 3/mm (ref 1.4–6.5)
RBC: 3.9 10*6/uL (ref 3.80–5.20)
RDW: 13.9 % (ref 11.5–14.5)

## 2013-08-04 LAB — COMPREHENSIVE METABOLIC PANEL
Albumin: 3.4 g/dL (ref 3.4–5.0)
BUN: 10 mg/dL (ref 7–18)
Bilirubin,Total: 0.4 mg/dL (ref 0.2–1.0)
Calcium, Total: 9.1 mg/dL (ref 8.5–10.1)
Creatinine: 0.98 mg/dL (ref 0.60–1.30)
EGFR (Non-African Amer.): >60
SGOT(AST): 24 U/L (ref 15–37)
Sodium: 142 mmol/L (ref 136–145)

## 2013-08-05 LAB — CANCER ANTIGEN 27.29: CA 27.29: 25.4 U/mL (ref 0.0–38.6)

## 2013-08-10 LAB — CBC CANCER CENTER
Basophil #: 0 x10 3/mm (ref 0.0–0.1)
Basophil %: 0.3 %
Eosinophil #: 0.1 x10 3/mm (ref 0.0–0.7)
Eosinophil %: 2.5 %
HCT: 37 % (ref 35.0–47.0)
HGB: 12.4 g/dL (ref 12.0–16.0)
Lymphocyte #: 0.8 x10 3/mm — ABNORMAL LOW (ref 1.0–3.6)
Lymphocyte %: 19.1 %
MCH: 30.3 pg (ref 26.0–34.0)
MCHC: 33.4 g/dL (ref 32.0–36.0)
Monocyte #: 0.4 x10 3/mm (ref 0.2–0.9)
Monocyte %: 8.8 %
Neutrophil #: 3.1 x10 3/mm (ref 1.4–6.5)
Platelet: 222 x10 3/mm (ref 150–440)
RBC: 4.09 10*6/uL (ref 3.80–5.20)
RDW: 13.6 % (ref 11.5–14.5)
WBC: 4.5 x10 3/mm (ref 3.6–11.0)

## 2013-08-17 LAB — CBC CANCER CENTER
Eosinophil #: 0.1 x10 3/mm (ref 0.0–0.7)
Eosinophil %: 1.6 %
HCT: 37.5 % (ref 35.0–47.0)
HGB: 12.7 g/dL (ref 12.0–16.0)
Lymphocyte #: 0.7 x10 3/mm — ABNORMAL LOW (ref 1.0–3.6)
Monocyte #: 0.5 x10 3/mm (ref 0.2–0.9)
Monocyte %: 8.3 %
Neutrophil #: 4.6 x10 3/mm (ref 1.4–6.5)
Neutrophil %: 78 %
Platelet: 228 x10 3/mm (ref 150–440)
RBC: 4.15 10*6/uL (ref 3.80–5.20)
WBC: 5.9 x10 3/mm (ref 3.6–11.0)

## 2013-08-24 ENCOUNTER — Ambulatory Visit: Payer: Self-pay | Admitting: Oncology

## 2013-08-25 LAB — CBC CANCER CENTER
Basophil #: 0 x10 3/mm (ref 0.0–0.1)
Basophil %: 0.4 %
Eosinophil #: 0.1 x10 3/mm (ref 0.0–0.7)
Eosinophil %: 2.6 %
HCT: 33.1 % — ABNORMAL LOW (ref 35.0–47.0)
Lymphocyte #: 0.7 x10 3/mm — ABNORMAL LOW (ref 1.0–3.6)
MCV: 90 fL (ref 80–100)
Monocyte #: 0.4 x10 3/mm (ref 0.2–0.9)
Monocyte %: 9.4 %
Neutrophil #: 3 x10 3/mm (ref 1.4–6.5)
Neutrophil %: 71.8 %
Platelet: 196 x10 3/mm (ref 150–440)
RDW: 14.1 % (ref 11.5–14.5)
WBC: 4.2 x10 3/mm (ref 3.6–11.0)

## 2013-09-24 ENCOUNTER — Ambulatory Visit: Payer: Self-pay | Admitting: Oncology

## 2013-09-27 ENCOUNTER — Ambulatory Visit: Payer: Self-pay | Admitting: Gastroenterology

## 2013-10-24 ENCOUNTER — Ambulatory Visit: Payer: Self-pay | Admitting: Oncology

## 2013-11-24 ENCOUNTER — Ambulatory Visit: Payer: Self-pay | Admitting: Oncology

## 2013-12-08 ENCOUNTER — Ambulatory Visit: Payer: Self-pay | Admitting: General Surgery

## 2013-12-09 ENCOUNTER — Encounter: Payer: Self-pay | Admitting: General Surgery

## 2013-12-22 ENCOUNTER — Ambulatory Visit (INDEPENDENT_AMBULATORY_CARE_PROVIDER_SITE_OTHER): Payer: BC Managed Care – PPO | Admitting: General Surgery

## 2013-12-22 VITALS — BP 124/68 | HR 76 | Resp 14 | Ht 61.0 in | Wt 165.0 lb

## 2013-12-22 DIAGNOSIS — C50919 Malignant neoplasm of unspecified site of unspecified female breast: Secondary | ICD-10-CM

## 2013-12-22 NOTE — Progress Notes (Signed)
Patient ID: Deanna Baker, female   DOB: 1960/12/31, 52 y.o.   MRN: [id number]  Chief Complaint  Patient presents with  . Follow-up    mammogram    HPI Deanna Baker is a 53 y.o. female who presents for a breast evaluation. The most recent mammogram was done on 12/08/13. Patient does perform regular self breast checks and gets regular mammograms done.    HPI  Past Medical History  Diagnosis Date  . Fibromyalgia 1993  . Arthritis 1990  . Hypertension 2012  . Heart murmur   . Measles 1960  . Lump or mass in breast   . Mumps 1970  . Chronic fatigue syndrome   . Colon polyp 2013  . 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 Upper gi endoscopy      Dr. Suzette Battiest  . Shoulder surgery Left 2009  . Breast lumpectomy Left 12/06/12     lumpectomy with SN biopsy and power port placement  . Colonoscopy  2013    Dr. Suzette Battiest   . Tubal ligation  1982  . Tonsillectomy  1965    Family History  Problem Relation Age of Onset  . Breast cancer Mother     Social History History  Substance Use Topics  . Smoking status: Former Smoker -- 1.00 packs/day for 20 years    Types: Cigarettes  . Smokeless tobacco: Never Used  . Alcohol Use: Yes    No Known Allergies  Current Outpatient Prescriptions  Medication Sig Dispense Refill  . ALPRAZolam (XANAX) 0.25 MG tablet Take 0.25 mg by mouth at bedtime as needed for sleep.      Marland Kitchen amLODipine (NORVASC) 10 MG tablet Take 10 mg by mouth daily.      Marland Kitchen amLODipine-benazepril (LOTREL) 5-20 MG per capsule Take 1 capsule by mouth daily.      Marland Kitchen amphetamine-dextroamphetamine (ADDERALL) 30 MG tablet Take 30 mg by mouth daily.      . ARIPiprazole (ABILIFY) 2 MG tablet Take 2 mg by mouth daily.      Mariane Baumgarten Calcium (STOOL SOFTENER PO) Take 1 tablet by mouth as needed.      Marland Kitchen escitalopram (LEXAPRO) 10 MG tablet Take 10 mg by mouth  daily.      Marland Kitchen esomeprazole (NEXIUM) 40 MG capsule Take 40 mg by mouth daily before breakfast.      . gabapentin (NEURONTIN) 400 MG capsule Take 400 mg by mouth 3 (three) times daily.      . Iron-Vitamin C (IRON 100/C PO) Take 1 tablet by mouth daily.      . meloxicam (MOBIC) 15 MG tablet Take 15 mg by mouth daily.      . traMADol (ULTRAM) 50 MG tablet Take 50 mg by mouth every 6 (six) hours as needed for pain.       No current facility-administered medications for this visit.    Review of Systems Review of Systems  Constitutional: Negative.   Respiratory: Negative.   Cardiovascular: Negative.     Blood pressure 124/68, pulse 76, resp. rate 14, height 5\' 1"  (1.549 m), weight 165 lb (74.844 kg).  Physical Exam Physical Exam  Constitutional: She is oriented to person, place, and time. She appears well-nourished.  Eyes: Conjunctivae are normal.  Neck: Neck supple. No mass and no thyromegaly present.  Cardiovascular: Normal rate, regular rhythm, normal heart sounds, intact distal pulses and normal  pulses.   Pulses:      Dorsalis pedis pulses are 2+ on the right side, and 2+ on the left side.       Posterior tibial pulses are 2+ on the right side, and 2+ on the left side.  No edema   Pulmonary/Chest: Breath sounds normal. Right breast exhibits no inverted nipple, no mass, no nipple discharge, no skin change and no tenderness. Left breast exhibits no inverted nipple, no mass, no nipple discharge, no skin change and no tenderness.  Abdominal: Soft. Normal appearance and bowel sounds are normal. There is no hepatosplenomegaly. There is no tenderness. No hernia.  Lymphadenopathy:    She has no cervical adenopathy.    She has no axillary adenopathy.  Neurological: She is alert and oriented to person, place, and time.  Skin: Skin is warm and dry.    Data Reviewed Mammogram reviewed   Assessment    Stable exam. Triple neg invasive breast CA. S/P lumpectomy, radiation and chemo. Plan     Patient to return in six months left breast  Diagnotic mammogram       SANKAR,SEEPLAPUTHUR G 12/26/2013, 8:24 AM

## 2013-12-22 NOTE — Patient Instructions (Signed)
Patient to return in six months left breast  Diagnotic mammogram

## 2013-12-25 ENCOUNTER — Ambulatory Visit: Payer: Self-pay | Admitting: Oncology

## 2013-12-26 ENCOUNTER — Encounter: Payer: Self-pay | Admitting: General Surgery

## 2014-01-09 ENCOUNTER — Encounter: Payer: Self-pay | Admitting: General Surgery

## 2014-02-01 ENCOUNTER — Ambulatory Visit: Payer: Self-pay | Admitting: Oncology

## 2014-02-14 LAB — CBC CANCER CENTER
BASOS ABS: 0 x10 3/mm (ref 0.0–0.1)
Basophil %: 0.5 %
Eosinophil #: 0.2 x10 3/mm (ref 0.0–0.7)
Eosinophil %: 4.3 %
HCT: 36.9 % (ref 35.0–47.0)
HGB: 12.1 g/dL (ref 12.0–16.0)
Lymphocyte #: 0.8 x10 3/mm — ABNORMAL LOW (ref 1.0–3.6)
Lymphocyte %: 19 %
MCH: 29.7 pg (ref 26.0–34.0)
MCHC: 32.7 g/dL (ref 32.0–36.0)
MCV: 91 fL (ref 80–100)
MONOS PCT: 9.9 %
Monocyte #: 0.4 x10 3/mm (ref 0.2–0.9)
NEUTROS PCT: 66.3 %
Neutrophil #: 2.9 x10 3/mm (ref 1.4–6.5)
Platelet: 185 x10 3/mm (ref 150–440)
RBC: 4.07 10*6/uL (ref 3.80–5.20)
RDW: 14.1 % (ref 11.5–14.5)
WBC: 4.4 x10 3/mm (ref 3.6–11.0)

## 2014-02-14 LAB — COMPREHENSIVE METABOLIC PANEL
Albumin: 3.5 g/dL (ref 3.4–5.0)
Alkaline Phosphatase: 168 U/L — ABNORMAL HIGH
Anion Gap: 7 (ref 7–16)
BUN: 12 mg/dL (ref 7–18)
Bilirubin,Total: 0.6 mg/dL (ref 0.2–1.0)
CHLORIDE: 105 mmol/L (ref 98–107)
Calcium, Total: 9.1 mg/dL (ref 8.5–10.1)
Co2: 30 mmol/L (ref 21–32)
Creatinine: 0.94 mg/dL (ref 0.60–1.30)
EGFR (African American): >60
GLUCOSE: 117 mg/dL — AB (ref 65–99)
Osmolality: 284 (ref 275–301)
Potassium: 3.8 mmol/L (ref 3.5–5.1)
SGOT(AST): 24 U/L (ref 15–37)
SGPT (ALT): 27 U/L (ref 12–78)
SODIUM: 142 mmol/L (ref 136–145)
Total Protein: 7.3 g/dL (ref 6.4–8.2)

## 2014-02-15 LAB — CANCER ANTIGEN 27.29: CA 27.29: 26.1 U/mL (ref 0.0–38.6)

## 2014-02-22 ENCOUNTER — Ambulatory Visit: Payer: Self-pay | Admitting: Oncology

## 2014-02-28 ENCOUNTER — Ambulatory Visit (INDEPENDENT_AMBULATORY_CARE_PROVIDER_SITE_OTHER): Payer: BC Managed Care – PPO | Admitting: General Surgery

## 2014-02-28 ENCOUNTER — Encounter: Payer: Self-pay | Admitting: General Surgery

## 2014-02-28 VITALS — BP 110/74 | HR 88 | Resp 12 | Ht 61.0 in | Wt 166.0 lb

## 2014-02-28 DIAGNOSIS — C50419 Malignant neoplasm of upper-outer quadrant of unspecified female breast: Secondary | ICD-10-CM

## 2014-02-28 NOTE — Progress Notes (Signed)
Here today for port removal.  Procedure completed today. Pt with triple neg left breast CA, completed surgery,chemo and radiation   Port location : right upper chest.  Anesthesia: 1% xylocaine mixed with 0.5% marcaine.  Prep: Chloro Prep  Prior incision opened, capsue around port opened. Port freed, anchoring stitches removed. Port removed in it's entirety.  Closed with 3-0 vicryl subcutaneous, and 4-0 vicryl subcuticular. Steri strips, telfa and tegaderm placed.  Procedure well tolerated.

## 2014-02-28 NOTE — Patient Instructions (Signed)
Keep area clean and dry. You may take the dressing off in 2 days. Leave the steri strips in place they will fall off on their own in 5 days. Call with any questions.

## 2014-03-24 ENCOUNTER — Ambulatory Visit: Payer: Self-pay | Admitting: Oncology

## 2014-06-22 ENCOUNTER — Encounter: Payer: Self-pay | Admitting: General Surgery

## 2014-07-03 ENCOUNTER — Encounter: Payer: Self-pay | Admitting: General Surgery

## 2014-07-03 ENCOUNTER — Ambulatory Visit (INDEPENDENT_AMBULATORY_CARE_PROVIDER_SITE_OTHER): Payer: BC Managed Care – PPO | Admitting: General Surgery

## 2014-07-03 VITALS — BP 130/70 | HR 76 | Resp 12 | Ht 66.0 in | Wt 160.0 lb

## 2014-07-03 DIAGNOSIS — C50412 Malignant neoplasm of upper-outer quadrant of left female breast: Secondary | ICD-10-CM

## 2014-07-03 DIAGNOSIS — C50419 Malignant neoplasm of upper-outer quadrant of unspecified female breast: Secondary | ICD-10-CM

## 2014-07-03 NOTE — Patient Instructions (Addendum)
Patient will be asked to return to the office in six months with  a bilateral diagnoticmammogram. Continue self breast exams. Call office for any new breast issues or concerns.

## 2014-07-03 NOTE — Progress Notes (Signed)
Patient ID: Deanna Baker, female   DOB: 1961-02-25, 53 y.o.   MRN: [id number]  Chief Complaint  Patient presents with  . Follow-up    mammogram    HPI Deanna Baker is a 53 y.o. female who presents for  Breast cancer follow up . The most recent mammogram was done on 06/22/14 at [facility].Patient does perform regular self breast checks and gets regular mammograms done.  Only complains of some soreness in left breast. Otherwise doing well.  HPI  Past Medical History  Diagnosis Date  . Fibromyalgia 1993  . Arthritis 1990  . Hypertension 2012  . Heart murmur   . Measles 1960  . Lump or mass in breast   . Mumps 1970  . Chronic fatigue syndrome   . Colon polyp 2013  . 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 Upper gi endoscopy      Dr. Suzette Battiest  . Shoulder surgery Left 2009  . Colonoscopy  2013    Dr. Suzette Battiest   . Tubal ligation  1982  . Tonsillectomy  1965  . Breast lumpectomy Left 12/06/12     lumpectomy with SN biopsy and power port placement...    Family History  Problem Relation Age of Onset  . Breast cancer Mother     Social History History  Substance Use Topics  . Smoking status: Former Smoker -- 1.00 packs/day for 20 years    Types: Cigarettes  . Smokeless tobacco: Never Used  . Alcohol Use: Yes    No Known Allergies  Current Outpatient Prescriptions  Medication Sig Dispense Refill  . ALPRAZolam (XANAX) 0.25 MG tablet Take 0.25 mg by mouth at bedtime as needed for sleep.      Marland Kitchen amLODipine (NORVASC) 10 MG tablet Take 10 mg by mouth daily.      Marland Kitchen amLODipine-benazepril (LOTREL) 5-20 MG per capsule Take 1 capsule by mouth daily.      Marland Kitchen amphetamine-dextroamphetamine (ADDERALL) 30 MG tablet Take 30 mg by mouth daily.      . ARIPiprazole (ABILIFY) 2 MG tablet Take 2 mg by mouth daily.      Marland Kitchen atorvastatin (LIPITOR) 20 MG tablet Take 20 mg by  mouth daily at 6 PM.       . Docusate Calcium (STOOL SOFTENER PO) Take 1 tablet by mouth as needed.      . gabapentin (NEURONTIN) 400 MG capsule Take 400 mg by mouth 3 (three) times daily.      . Iron-Vitamin C (IRON 100/C PO) Take 1 tablet by mouth daily.      . meloxicam (MOBIC) 15 MG tablet Take 15 mg by mouth daily.      Marland Kitchen omeprazole (PRILOSEC) 40 MG capsule       . PARoxetine (PAXIL) 40 MG tablet Take 40 mg by mouth every morning.       . traMADol (ULTRAM) 50 MG tablet Take 50 mg by mouth every 6 (six) hours as needed for pain.       No current facility-administered medications for this visit.    Review of Systems Review of Systems  Constitutional: Negative.   Respiratory: Negative.   Cardiovascular: Negative.     Blood pressure 130/70, pulse 76, resp. rate 12, height $RemoveBe'5\' 6"'YkaLWUTBZ$  (1.676 m), weight 160 lb (72.576 kg).  Physical Exam Physical Exam  Constitutional: She is oriented to person, place, and time.  She appears well-developed and well-nourished.  Eyes: Conjunctivae are normal. No scleral icterus.  Neck: No mass and no thyromegaly present.  Cardiovascular: Normal rate, regular rhythm and normal heart sounds.   Pulmonary/Chest: Effort normal and breath sounds normal. Right breast exhibits no inverted nipple, no mass, no nipple discharge, no skin change and no tenderness. Left breast exhibits no inverted nipple, no mass, no nipple discharge, no skin change and no tenderness.  Lymphadenopathy:    She has no cervical adenopathy.    She has no axillary adenopathy.  Neurological: She is alert and oriented to person, place, and time.  Skin: Skin is warm and dry.    Data Reviewed Mammogram left reviewed- stable pst limpecromy changes. Assessment    Stable exam . Patient is 18 months left breast lumpectomy/SN, chemo and radiation for triple neg cancer.   Genetic test was neg for BRCA.   Port was removed 4 mos ago. Plan    Patient will be asked to return to the office in six  months with  a bilateral diagnoticmammogram.     PCP: Terance Hart 07/03/2014, 3:59 PM

## 2014-08-21 ENCOUNTER — Ambulatory Visit: Payer: Self-pay | Admitting: Oncology

## 2014-08-22 LAB — COMPREHENSIVE METABOLIC PANEL
ALBUMIN: 3.6 g/dL (ref 3.4–5.0)
ANION GAP: 8 (ref 7–16)
Alkaline Phosphatase: 147 U/L — ABNORMAL HIGH
BUN: 12 mg/dL (ref 7–18)
Bilirubin,Total: 0.3 mg/dL (ref 0.2–1.0)
CALCIUM: 9 mg/dL (ref 8.5–10.1)
CHLORIDE: 104 mmol/L (ref 98–107)
CREATININE: 1.13 mg/dL (ref 0.60–1.30)
Co2: 27 mmol/L (ref 21–32)
EGFR (African American): >60
EGFR (Non-African Amer.): 54 — ABNORMAL LOW
Glucose: 94 mg/dL (ref 65–99)
Osmolality: 277 (ref 275–301)
POTASSIUM: 4 mmol/L (ref 3.5–5.1)
SGOT(AST): 18 U/L (ref 15–37)
SGPT (ALT): 29 U/L
Sodium: 139 mmol/L (ref 136–145)
TOTAL PROTEIN: 6.6 g/dL (ref 6.4–8.2)

## 2014-08-22 LAB — CBC CANCER CENTER
BASOS PCT: 0.5 %
Basophil #: 0 x10 3/mm (ref 0.0–0.1)
Eosinophil #: 0.1 x10 3/mm (ref 0.0–0.7)
Eosinophil %: 1.6 %
HCT: 36.9 % (ref 35.0–47.0)
HGB: 11.9 g/dL — AB (ref 12.0–16.0)
LYMPHS ABS: 1.3 x10 3/mm (ref 1.0–3.6)
LYMPHS PCT: 23 %
MCH: 28.8 pg (ref 26.0–34.0)
MCHC: 32.4 g/dL (ref 32.0–36.0)
MCV: 89 fL (ref 80–100)
MONO ABS: 0.4 x10 3/mm (ref 0.2–0.9)
Monocyte %: 7.1 %
NEUTROS PCT: 67.8 %
Neutrophil #: 3.7 x10 3/mm (ref 1.4–6.5)
Platelet: 220 x10 3/mm (ref 150–440)
RBC: 4.15 10*6/uL (ref 3.80–5.20)
RDW: 14.6 % — AB (ref 11.5–14.5)
WBC: 5.5 x10 3/mm (ref 3.6–11.0)

## 2014-08-25 LAB — CANCER ANTIGEN 27.29: CA 27.29: 23.8 U/mL (ref 0.0–38.6)

## 2014-09-06 ENCOUNTER — Ambulatory Visit: Payer: Self-pay | Admitting: Oncology

## 2014-09-24 ENCOUNTER — Ambulatory Visit: Payer: Self-pay | Admitting: Oncology

## 2014-09-25 ENCOUNTER — Encounter: Payer: Self-pay | Admitting: General Surgery

## 2014-10-17 ENCOUNTER — Ambulatory Visit: Payer: Self-pay | Admitting: Family Medicine

## 2014-12-14 ENCOUNTER — Ambulatory Visit (INDEPENDENT_AMBULATORY_CARE_PROVIDER_SITE_OTHER): Payer: BLUE CROSS/BLUE SHIELD | Admitting: General Surgery

## 2014-12-14 ENCOUNTER — Encounter: Payer: Self-pay | Admitting: General Surgery

## 2014-12-14 VITALS — BP 132/74 | HR 76 | Resp 14 | Ht 61.0 in | Wt 156.0 lb

## 2014-12-14 DIAGNOSIS — C50412 Malignant neoplasm of upper-outer quadrant of left female breast: Secondary | ICD-10-CM

## 2014-12-14 NOTE — Progress Notes (Signed)
Patient ID: Deanna Baker, female   DOB: 26-Aug-1961, 54 y.o.   MRN: [id number]  Chief Complaint  Patient presents with  . Follow-up    mammogram    HPI Deanna Baker is a 54 y.o. female who presents for a breast cancer follow up. The most recent mammogram was done on 12/08/14. Patient does perform regular self breast checks and gets regular mammograms done. No complaints at this time.      HPI  Past Medical History  Diagnosis Date  . Fibromyalgia 1993  . Arthritis 1990  . Hypertension 2012  . Heart murmur   . Measles 1960  . Lump or mass in breast   . Mumps 1970  . Chronic fatigue syndrome   . Colon polyp 2013  . 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 Upper gi endoscopy      Dr. Suzette Battiest  . Shoulder surgery Left 2009  . Colonoscopy  2013    Dr. Suzette Battiest   . Tubal ligation  1982  . Tonsillectomy  1965  . Breast lumpectomy Left 12/06/12     lumpectomy with SN biopsy and power port placement...    Family History  Problem Relation Age of Onset  . Breast cancer Mother     Social History History  Substance Use Topics  . Smoking status: Former Smoker -- 1.00 packs/day for 20 years    Types: Cigarettes  . Smokeless tobacco: Never Used  . Alcohol Use: 0.0 oz/week    0 Not specified per week    No Known Allergies  Current Outpatient Prescriptions  Medication Sig Dispense Refill  . ALPRAZolam (XANAX) 0.25 MG tablet Take 0.25 mg by mouth at bedtime as needed for sleep.    Marland Kitchen amLODipine-benazepril (LOTREL) 5-20 MG per capsule Take 1 capsule by mouth daily.    Marland Kitchen amphetamine-dextroamphetamine (ADDERALL) 30 MG tablet Take 30 mg by mouth daily.    Marland Kitchen buPROPion (WELLBUTRIN XL) 150 MG 24 hr tablet Take 1 tablet by mouth daily.  1  . gabapentin (NEURONTIN) 400 MG capsule Take 400 mg by mouth 3 (three) times daily.    . meloxicam (MOBIC) 15 MG tablet  Take 15 mg by mouth as needed.     Marland Kitchen omeprazole (PRILOSEC) 40 MG capsule Take 40 mg by mouth daily.     Marland Kitchen PARoxetine (PAXIL) 40 MG tablet Take 40 mg by mouth every morning.     . traMADol (ULTRAM) 50 MG tablet Take 50 mg by mouth every 6 (six) hours as needed for pain.     No current facility-administered medications for this visit.    Review of Systems Review of Systems  Constitutional: Negative.   Respiratory: Negative.   Cardiovascular: Negative.     Blood pressure 132/74, pulse 76, resp. rate 14, height 5\' 1"  (1.549 m), weight 156 lb (70.761 kg).  Physical Exam Physical Exam  Constitutional: She is oriented to person, place, and time. She appears well-developed and well-nourished.  Eyes: Conjunctivae are normal. No scleral icterus.  Neck: Neck supple. No thyromegaly present.  Cardiovascular: Normal rate, regular rhythm and normal heart sounds.   No murmur heard. Pulmonary/Chest: Effort normal and breath sounds normal. Right breast exhibits no inverted nipple, no mass, no nipple discharge, no skin change and no tenderness. Left breast exhibits no inverted nipple, no mass, no nipple discharge, no skin change and no tenderness.  Lymphadenopathy:    She has no cervical adenopathy.    She has no axillary adenopathy.  Neurological: She is alert and oriented to person, place, and time.  Skin: Skin is warm and dry.    Data Reviewed Mammogram reviewed and stable.   Assessment    Stable exam. 82yrs post left breast lumpectomy, radiation and chemotherapy.     Plan    Follow up in 1 yr with bilateral diagnostic mammogram.        Junie Panning G 12/15/2014, 3:34 PM

## 2014-12-14 NOTE — Patient Instructions (Signed)
Patient to return in 1 year for follow up.Continue self breast exams. Call office for any new breast issues or concerns.  

## 2014-12-15 ENCOUNTER — Encounter: Payer: Self-pay | Admitting: General Surgery

## 2015-02-12 ENCOUNTER — Encounter: Payer: Self-pay | Admitting: General Surgery

## 2015-02-12 ENCOUNTER — Ambulatory Visit: Payer: Self-pay | Admitting: Family Medicine

## 2015-02-20 ENCOUNTER — Ambulatory Visit
Admit: 2015-02-20 | Disposition: A | Discharge status: Home / Self Care | Payer: Self-pay | Attending: Oncology | Admitting: Oncology

## 2015-02-20 LAB — COMPREHENSIVE METABOLIC PANEL
ALK PHOS: 119 U/L
ALT: 14 U/L
Albumin: 4 g/dL
Anion Gap: 3 — ABNORMAL LOW (ref 7–16)
BUN: 14 mg/dL
Bilirubin,Total: 0.5 mg/dL
CHLORIDE: 107 mmol/L
CO2: 26 mmol/L
Calcium, Total: 8.7 mg/dL — ABNORMAL LOW
Creatinine: 1.05 mg/dL — ABNORMAL HIGH
EGFR (African American): >60
EGFR (Non-African Amer.): >60
Glucose: 113 mg/dL — ABNORMAL HIGH
Potassium: 3.9 mmol/L
SGOT(AST): 18 U/L
Sodium: 136 mmol/L
TOTAL PROTEIN: 7 g/dL

## 2015-02-20 LAB — CBC CANCER CENTER
Basophil #: 0 x10 3/mm (ref 0.0–0.1)
Basophil %: 0.5 %
EOS ABS: 0.1 x10 3/mm (ref 0.0–0.7)
EOS PCT: 2.3 %
HCT: 34.3 % — ABNORMAL LOW (ref 35.0–47.0)
HGB: 11.3 g/dL — ABNORMAL LOW (ref 12.0–16.0)
LYMPHS ABS: 1.1 x10 3/mm (ref 1.0–3.6)
Lymphocyte %: 22.9 %
MCH: 28.9 pg (ref 26.0–34.0)
MCHC: 33.1 g/dL (ref 32.0–36.0)
MCV: 88 fL (ref 80–100)
MONO ABS: 0.3 x10 3/mm (ref 0.2–0.9)
Monocyte %: 6.3 %
NEUTROS PCT: 68 %
Neutrophil #: 3.2 x10 3/mm (ref 1.4–6.5)
PLATELETS: 209 x10 3/mm (ref 150–440)
RBC: 3.93 10*6/uL (ref 3.80–5.20)
RDW: 15.1 % — ABNORMAL HIGH (ref 11.5–14.5)
WBC: 4.8 x10 3/mm (ref 3.6–11.0)

## 2015-02-23 ENCOUNTER — Ambulatory Visit
Admit: 2015-02-23 | Disposition: A | Discharge status: Home / Self Care | Payer: Self-pay | Attending: Oncology | Admitting: Oncology

## 2015-03-16 NOTE — Op Note (Signed)
PATIENT NAME:  Deanna Baker, Deanna Baker MR#:  [id number] DATE OF BIRTH:  Aug 06, 1961  DATE OF PROCEDURE:  12/06/2012  PREOPERATIVE DIAGNOSIS: Carcinoma of the left breast.   POSTOPERATIVE DIAGNOSIS: Carcinoma of the left breast.   PROCEDURE PERFORMED:  1. Insertion of venous access port in right subclavian vein. 2. Left breast lumpectomy. 3. Sentinel node biopsy from left axilla.  4. Ultrasound and fluoroscopic guidance during the procedures.   SURGEON: Mckinley Jewel, M.D.   ANESTHESIA: General.   COMPLICATIONS: None.   ESTIMATED BLOOD LOSS: Approximately 50 mL.   DRAINS: None.   DESCRIPTION OF PROCEDURE: The patient was put to sleep in the supine position, on the operating room table. The venous port was placed first. The right upper chest and neck area were prepped and draped out as a sterile field with the patient in slight Trendelenburg position. An ultrasound probe with sterile cover was brought up to the field. The subclavian vein was identified beneath the lateral end of the clavicle and after instillation of a small amount of 0.5% Marcaine in the skin a skin incision was made to allow needle to be introduced into the subclavian vein under guidance. Free withdrawal of blood was noted. Following this a guidewire followed by introducer and dilator were placed. With fluoroscopic guidance, a subsequent catheter was positioned going into the superior vena cava. The skin marking was at 20 cm. A subcutaneous port was created over the second costal cartilage. A skin incision was made after instillation of more of the 0.5% Marcaine. A total of 10 mL was utilized in this part of the procedure. A subcutaneous pocket was then made with the cautery. The catheter was then tunneled through to the port site, cut to approximate length and then affixed to the prefilled port. Three sutures of 2-0 Prolene were used to anchor the port to the underlying fascia. This was placed in the pocket. The catheter was smoothed  out. Fluoroscopy again confirmed good positioning of the catheter tip in the distal superior vena cava. The port was flushed through with heparinized saline. Subcutaneous tissue was closed with 3-0 Vicryl and the skin with subcuticular 4-0 Vicryl covered with Dermabond.   Following this gloves and gowns were changed. The patient had the left breast and axilla evaluated. The patient had very poor signal activity from the sentinel node gamma finder. In view of this, 4 mL of 50% methylene blue was instilled in the subareolar space. The breast and axilla were then prepped and draped out as a sterile field.  Very minimal signal activity was located in the mid axilla medially and overlying this a skin incision was made, after instillation of some Marcaine. Incision was through the layers down to the axillary fat pad and bleeding was controlled with cautery. Although there was no blue lymph node identified, there were 2 palpable nodes less than a centimeter each, mostly fat replaced. The one that was somewhat inferior in location appeared to contain a small amount of signal activity, the other one did not, and there was absolutely no signal activity anywhere else in the axilla. These two nodes were then removed and sent off as sentinel node 1 and 2 with subsequent frozen section showing no evidence of macrometastases.   The lumpectomy was performed. Ultrasound was used to localize the mass which was located in the upper outer quadrant This was barely palpable, but more easily visible on ultrasound. The skin was marked overlying this mass to allow for an elliptical portion of  skin to be excised out, in a curvilinear fashion, and the incision was mapped out. Local anesthetic of 0.5% Marcaine was instilled. A skin incision was made overlying the mass. The skin also included a small skin tag attached to it. The subcutaneous tissue was entered into and the skin and subcutaneous tissue was elevated on both the medial and  lateral aspect and the breast tissue from the skin down to the pectoralis fascia overlying the outer edge of the pectoral muscle was excised out in full with cautery for control of bleeding. A firm palpable mass was noted within this. It appeared most of the margins were more than adequate, except the fascia was in the posterior margin of this lesion. In view of this, some additional tissue in the caudal aspect was excised out and sent off as new deep margin. The excised specimen was tagged for margins and sent to pathology for inking. Both wounds were irrigated and then closed. The subcutaneous tissue and the deeper tissues were closed with 2-0 Vicryl stitches and the skin approximated with subcuticular 4-0 Vicryl, covered with Dermabond. The procedure was well tolerated. There were no immediate problems       encountered. The patient was subsequently returned to the recovery room in stable condition. Chest x-ray was obtained in PAC-U which showed good positioning of the catheter, no evidence of pneumothorax.  ____________________________ S.Robinette Haines, MD sgs:sb D: 12/06/2012 12:14:31 ET T: 12/06/2012 12:24:06 ET JOB#: [id number]  cc: S.G. Jamal Collin, MD, <Dictator> [facility] Robinette Haines MD ELECTRONICALLY SIGNED 12/16/2012 7:33

## 2015-03-16 NOTE — Consult Note (Signed)
Reason for Visit: This 54 year old Female patient presents to the clinic for initial evaluation of  breast cancer .   Referred by Dr. Oliva Bustard.  Diagnosis:  Chief Complaint/Diagnosis   54 year old female with pathologic stage I (T1 C. 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 She underwent initial biopsy positive for invasive mammary carcinoma. Went on to have a wide local excision and sentinel node biopsy showing a 1.7 cm invasive mammary carcinoma overall grade 3. Tumor was triple negative. Initial margins were close at less than 1 mm she had reexcision with clear margins. 2 sentinel lymph nodes contained no evidence of metastatic disease. She was seen by medical oncology and has gone on to have Cytoxan Adriamycin plus Taxol. She tolerated her chemotherapy and surgery well. She does have some fiber [place] and has some significant fatigue. No real significant peripheral neuropathy is noted. She is now referred to radiation oncology for consideration of treatment. She is having no breast tenderness at this point in time.  Past Hx:    Breast Cancer:   Past, Family and Social History:  Past Medical History positive   Cardiovascular hypertension   Neurological/Psychiatric anxiety; depression   Past Medical History Comments fiber [place]   Family History positive   Family History Comments strong family history of breast cancer mother with breast cancer age 29, father with brain cancer and history of adult onset  diabetes   Social History noncontributory   Additional Past Medical and Surgical History seen by herself today   Allergies:   NKDA: None  Home Meds:  Home Medications: Medication Instructions Status  ferrous fumarate 325 mg oral tablet 1 tab(s) orally once a day Active  Zofran 4 mg oral tablet 1 tab(s) orally every 4 hours Active  Nexium 40 mg oral delayed release capsule 1 cap(s) orally once a day Active  magic mouth wash    2 times a day Active  gabapentin 400mg  1 tab(s) orally 2 times a day Active  Adderall 30 mg oral tablet 1 tab(s) orally 2 times a day (breakfast and lunch) Active  Abilify 5 mg oral tablet 1 tab(s) orally once a day at hs Active  alprazolam 0.5 mg oral tablet 1 tab oral daily as needed Active  amlodipine-benazepril 5 mg-20 mg oral capsule 1 cap(s) orally once a day at hs Active  meloxicam 15 mg oral tablet 1 tab(s) orally once a day, As Needed Active  tramadol 50 mg oral tablet 1 tab(s) orally every 6 hours as needed for pain Active  lexapro 10mg  0.5 tab(s) orally once a day Active   Review of Systems:  General negative   Performance Status (ECOG) 0   Skin negative   Breast see HPI   Ophthalmologic negative   ENMT negative   Respiratory and Thorax negative   Cardiovascular negative   Gastrointestinal negative   Genitourinary negative   Musculoskeletal negative   Neurological negative   Psychiatric negative   Hematology/Lymphatics negative   Endocrine negative   Allergic/Immunologic negative   Nursing Notes:  Nursing Vital  Signs and Chemo Nursing Nursing Notes: *CC Vital Signs Flowsheet:   24-Jul-14 08:19  Temp Temperature 97.8  Pulse Pulse 91  Respirations Respirations 20  SBP SBP 124  DBP DBP 79  Pain Scale (0-10)  0  Current Weight (kg) (kg) 72.8  Height (cm) centimeters 155  BSA (m2) 1.7   Physical Exam:  General/Skin/HEENT:  General normal   Skin normal   Eyes normal   ENMT normal   Head and Neck normal    Additional PE well-developed well-nourished female in NAD. Lungs are clear to A&P cardiac examination shows regular rate and rhythm. She status post wide local excision of the left breast which is well-healed. No dominant mass or nodularity is noted in either breast into position examined. No axillary or supraclavicular adenopathy is appreciated. Abdomen is benign.   Breasts/Resp/CV/GI/GU:  Respiratory and Thorax normal   Cardiovascular normal   Gastrointestinal normal   Genitourinary normal   MS/Neuro/Psych/Lymph:  Musculoskeletal normal   Neurological normal   Lymphatics normal   Relevent Results:   Relevant Scans and Labs mammograms are reviewed   Assessment and Plan: Impression:   pathologic stage I triple-negative invasive mammary carcinoma of the left breast status post wide local excision and sentinel node biopsy plus adjuvant chemotherapy Plan:   at this time I have recommended whole breast radiation therapy. Would plan on delivering 5000 cGy over 5 weeks plus a 2000 cGy scar boost based on initial close margin. Risks and benefits of treatment including skin reaction, fatigue, inclusive some superficial lung, all were explained in detail to the patient. She seems to comprehend my treatment plan well. I have set her up for CT simulation next week. She will not be a candidate for aromatase inhibitor based on her triple negative nature of her disease.  I would like to take this opportunity to thank you for allowing me to continue to participate in this patient's care.  CC Referral:  cc: Dr. Ancil Boozer   Electronic Signatures: Baruch Gouty, Roda Shutters (MD)  (Signed 24-Jul-14 11:46)  Authored: HPI, Diagnosis, Past Hx, PFSH, Allergies, Home Meds, ROS, Nursing Notes, Physical Exam, Relevent Results, Encounter Assessment and Plan, CC Referring Physician   Last Updated: 24-Jul-14 11:46 by Armstead Peaks (MD)

## 2015-05-01 ENCOUNTER — Encounter: Payer: Self-pay | Admitting: Family Medicine

## 2015-05-01 ENCOUNTER — Ambulatory Visit (INDEPENDENT_AMBULATORY_CARE_PROVIDER_SITE_OTHER): Payer: BLUE CROSS/BLUE SHIELD | Admitting: Family Medicine

## 2015-05-01 VITALS — BP 126/62 | HR 97 | Temp 98.4°F | Resp 16 | Ht 61.0 in | Wt 165.7 lb

## 2015-05-01 DIAGNOSIS — L602 Onychogryphosis: Secondary | ICD-10-CM

## 2015-05-01 DIAGNOSIS — C50919 Malignant neoplasm of unspecified site of unspecified female breast: Secondary | ICD-10-CM | POA: Insufficient documentation

## 2015-05-01 DIAGNOSIS — I10 Essential (primary) hypertension: Secondary | ICD-10-CM

## 2015-05-01 DIAGNOSIS — F418 Other specified anxiety disorders: Secondary | ICD-10-CM

## 2015-05-01 DIAGNOSIS — G43009 Migraine without aura, not intractable, without status migrainosus: Secondary | ICD-10-CM | POA: Insufficient documentation

## 2015-05-01 DIAGNOSIS — R739 Hyperglycemia, unspecified: Secondary | ICD-10-CM | POA: Diagnosis not present

## 2015-05-01 DIAGNOSIS — G4731 Primary central sleep apnea: Secondary | ICD-10-CM

## 2015-05-01 DIAGNOSIS — Z9889 Other specified postprocedural states: Secondary | ICD-10-CM

## 2015-05-01 DIAGNOSIS — E8881 Metabolic syndrome: Secondary | ICD-10-CM | POA: Insufficient documentation

## 2015-05-01 DIAGNOSIS — M797 Fibromyalgia: Secondary | ICD-10-CM | POA: Diagnosis not present

## 2015-05-01 DIAGNOSIS — L304 Erythema intertrigo: Secondary | ICD-10-CM | POA: Insufficient documentation

## 2015-05-01 DIAGNOSIS — Z23 Encounter for immunization: Secondary | ICD-10-CM | POA: Diagnosis not present

## 2015-05-01 DIAGNOSIS — D8989 Other specified disorders involving the immune mechanism, not elsewhere classified: Secondary | ICD-10-CM | POA: Insufficient documentation

## 2015-05-01 DIAGNOSIS — K219 Gastro-esophageal reflux disease without esophagitis: Secondary | ICD-10-CM | POA: Diagnosis not present

## 2015-05-01 DIAGNOSIS — F419 Anxiety disorder, unspecified: Secondary | ICD-10-CM

## 2015-05-01 DIAGNOSIS — G9332 Myalgic encephalomyelitis/chronic fatigue syndrome: Secondary | ICD-10-CM | POA: Insufficient documentation

## 2015-05-01 DIAGNOSIS — R5382 Chronic fatigue, unspecified: Secondary | ICD-10-CM

## 2015-05-01 DIAGNOSIS — R351 Nocturia: Secondary | ICD-10-CM | POA: Insufficient documentation

## 2015-05-01 DIAGNOSIS — Z860100 Personal history of colon polyps, unspecified: Secondary | ICD-10-CM | POA: Insufficient documentation

## 2015-05-01 DIAGNOSIS — D509 Iron deficiency anemia, unspecified: Secondary | ICD-10-CM | POA: Diagnosis not present

## 2015-05-01 DIAGNOSIS — D500 Iron deficiency anemia secondary to blood loss (chronic): Secondary | ICD-10-CM | POA: Insufficient documentation

## 2015-05-01 DIAGNOSIS — F329 Major depressive disorder, single episode, unspecified: Secondary | ICD-10-CM

## 2015-05-01 DIAGNOSIS — M5441 Lumbago with sciatica, right side: Secondary | ICD-10-CM

## 2015-05-01 DIAGNOSIS — F32A Depression, unspecified: Secondary | ICD-10-CM

## 2015-05-01 DIAGNOSIS — E785 Hyperlipidemia, unspecified: Secondary | ICD-10-CM | POA: Insufficient documentation

## 2015-05-01 DIAGNOSIS — Q845 Enlarged and hypertrophic nails: Secondary | ICD-10-CM

## 2015-05-01 DIAGNOSIS — F339 Major depressive disorder, recurrent, unspecified: Secondary | ICD-10-CM | POA: Insufficient documentation

## 2015-05-01 MED ORDER — GABAPENTIN 400 MG PO CAPS: 400.0000 mg | ORAL_CAPSULE | Freq: Three times a day (TID) | ORAL | Status: DC

## 2015-05-01 MED ORDER — OMEPRAZOLE 40 MG PO CPDR: 40.0000 mg | DELAYED_RELEASE_CAPSULE | Freq: Every day | ORAL | Status: DC

## 2015-05-01 MED ORDER — RANITIDINE HCL 150 MG PO TABS: 150.0000 mg | ORAL_TABLET | Freq: Every day | ORAL | Status: DC

## 2015-05-01 MED ORDER — ONDANSETRON HCL 4 MG PO TABS
4.0000 mg | ORAL_TABLET | Freq: Three times a day (TID) | ORAL | Status: DC | PRN
Start: 2015-05-01 — End: 2015-09-07

## 2015-05-01 NOTE — Progress Notes (Signed)
Name: Deanna Baker   MRN: [id number]    DOB: Feb 02, 1961   Date:05/01/2015       Progress Note  Subjective  Chief Complaint  Chief Complaint  Patient presents with  . Depression  . Gastrophageal Reflux  . Sleep Apnea  . Fatigue    Gastrophageal Reflux    GERD: taking Omeprazole and Ranitidine, as prescribed and it is controlled symptoms. She also raised the head of the bed, and it has helped. She denies side effects  Fibromyalgia: she has daily symptoms, but worse over the past couple of days.  She felt great on Sunday and went to church, after that grocery shopping, followed by cooking , and had to clean her kitchen up, after that she has been having a lot of pain, and had to take some hydrocodone that she takes very seldom. She needs Zofran to control the nausea caused by hydrocodone. Symptoms are usually stable with gabapentin and tramadol, prn meloxicam.  Pain is worse on her neck and back, but currently does not have any symptoms of radiculitis.   HTN: she is taking medication and denies side effects, BP has been at goal. No chest pain at this time  or SOB  Hyperlipidemia: she is not taking medication, we will recheck labs and calculate her ASCVD   Chronic fatigue Syndrome: she feels tired with any extra work, with rebound fatigue, also has lack of motivation   Depression/anxiety: She is taking Wellbutrin and paroxetine, she is off Adderal - because it was not helping with her energy level.  She is still grieving the loss of her mother also gets down because of her multiple medical problems  Patient Active Problem List   Diagnosis Date Noted  . Benign essential HTN 05/01/2015  . CFIDS (chronic fatigue and immune dysfunction syndrome) 05/01/2015  . Anxiety and depression 05/01/2015  . Dyslipidemia 05/01/2015  . Fibromyalgia syndrome 05/01/2015  . Gastro-esophageal reflux disease without esophagitis 05/01/2015  . Blood glucose elevated 05/01/2015  . Eczema intertrigo  05/01/2015  . Iron deficiency anemia 05/01/2015  . Chronic migraine without aura without status migrainosus, not intractable 05/01/2015  . Excessive urination at night 05/01/2015  . Dysmetabolic syndrome 12/15/2409  . History of colonoscopy with polypectomy 05/01/2015  . Central sleep apnea 05/01/2015  . History of back surgery 05/01/2015  . Malignant neoplasm of upper-outer quadrant of female breast, triple negative 06/13/2013    Past Surgical History  Procedure Laterality Date  . Upper gi endoscopy      Dr. Suzette Battiest  . Shoulder surgery Left 2009  . Colonoscopy  2013    Dr. Suzette Battiest   . Tubal ligation  1982  . Tonsillectomy  1965  . Breast lumpectomy Left 12/06/12     lumpectomy with SN biopsy and power port placement...    Family History  Problem Relation Age of Onset  . Breast cancer Mother     History   Social History  . Marital Status: Married    Spouse Name: N/A  . Number of Children: N/A  . Years of Education: N/A   Occupational History  . Not on file.   Social History Main Topics  . Smoking status: Former Smoker -- 1.00 packs/day for 20 years    Types: Cigarettes    Quit date: 11/24/1980  . Smokeless tobacco: Never Used  . Alcohol Use: 0.0 oz/week    0 Standard drinks or equivalent per week     Comment: occasional  . Drug Use: No  .  Sexual Activity: Yes   Other Topics Concern  . Not on file   Social History Narrative     Current outpatient prescriptions:  .  ALPRAZolam (XANAX) 0.5 MG tablet, Take 1 tablet by mouth as needed. For sleep, Disp: , Rfl: 0 .  amLODipine-benazepril (LOTREL) 5-20 MG per capsule, Take 1 capsule by mouth daily., Disp: , Rfl:  .  buPROPion (WELLBUTRIN XL) 150 MG 24 hr tablet, Take 1 tablet by mouth daily., Disp: , Rfl: 1 .  gabapentin (NEURONTIN) 400 MG capsule, Take 1 capsule (400 mg total) by mouth 3 (three) times daily., Disp: 90 capsule, Rfl: 5 .  HYDROcodone-acetaminophen (NORCO) 10-325 MG per tablet, Take 1 tablet by  mouth 3 (three) times daily as needed., Disp: , Rfl:  .  meloxicam (MOBIC) 15 MG tablet, Take 15 mg by mouth as needed. , Disp: , Rfl:  .  omeprazole (PRILOSEC) 40 MG capsule, Take 1 capsule (40 mg total) by mouth daily., Disp: 30 capsule, Rfl: 5 .  ondansetron (ZOFRAN) 4 MG tablet, Take 1 tablet (4 mg total) by mouth every 8 (eight) hours as needed for nausea or vomiting., Disp: 20 tablet, Rfl: 0 .  PARoxetine (PAXIL) 40 MG tablet, Take 40 mg by mouth every morning. , Disp: , Rfl:  .  ranitidine (ZANTAC) 150 MG tablet, Take 1 tablet (150 mg total) by mouth daily., Disp: 30 tablet, Rfl: 5 .  traMADol (ULTRAM) 50 MG tablet, Take 50 mg by mouth every 6 (six) hours as needed for pain., Disp: , Rfl:   Allergies  Allergen Reactions  . Pregabalin     weight gain and blurred vision  . Venlafaxine     foggy minded     ROS  Constitutional: Negative for fever or weight change.  Respiratory: Negative for cough and shortness of breath.   Cardiovascular: Negative for chest pain or palpitations.  Gastrointestinal: Negative for abdominal pain, no bowel changes.  Musculoskeletal: Negative for gait problem or joint swelling. Back pain, joint aches and muscle aches  Skin: Negative for rash.  Neurological: Negative for dizziness or headache intermittently .  No other specific complaints in a complete review of systems (except as listed in HPI above).  Objective  Filed Vitals:   05/01/15 1355  BP: 126/62  Pulse: 97  Temp: 98.4 F (36.9 C)  TempSrc: Oral  Resp: 16  Height: _0  (1.549 m)  Weight: 165 lb 11.2 oz (75.161 kg)  SpO2: 97%    Physical Exam   Constitutional: Patient appears well-developed and well-nourished. No distress.  HENT: Head: Normocephalic and atraumatic. Ears: B TMs ok, no erythema or effusion; Nose: Nose normal. Mouth/Throat: Oropharynx is clear and moist. No oropharyngeal exudate.  Eyes: Conjunctivae and EOM are normal. Pupils are equal, round, and reactive to light.  No scleral icterus.  Neck: Normal range of motion. Neck supple. No JVD present. No thyromegaly present.  Cardiovascular: Normal rate, regular rhythm and normal heart sounds.  No murmur heard. No BLE edema. Pulmonary/Chest: Effort normal and breath sounds normal. No respiratory distress. Abdominal: Soft. Bowel sounds are normal, no distension. There is no tenderness. no masses Musculoskeletal: Normal range of motion, no joint effusion, positive trigger points - 18 of them.  Neurological: he is alert and oriented to person, place, and time. No cranial nerve deficit. Coordination, balance, strength, speech and gait are normal.  Skin: Skin is warm and dry. No rash noted. No erythema. She has thick toe nails. Also has 3 scalps lesions -looks  like a scab, advised to stop touching the area Psychiatric: Patient has a normal mood and affect. behavior is normal. Judgment and thought content normal.  Recent Results (from the past 2160 hour(s))  [facility]     Status: Abnormal   Collection Time: 02/20/15  2:18 PM  Result Value Ref Range   WBC 4.8 3.6-11.0 x10 3/mm    RBC 3.93 3.80-5.20 x10 6/mm    HGB 11.3 (L) 12.0-16.0 g/dL   HCT 34.3 (L) 35.0-47.0 %   MCV 88 80-100 fL   MCH 28.9 26.0-34.0 pg   MCHC 33.1 32.0-36.0 g/dL   RDW 15.1 (H) 11.5-14.5 %   Platelet 209 150-440 x10 3/mm    Neutrophil % 68.0 %   Lymphocyte % 22.9 %   Monocyte % 6.3 %   Eosinophil % 2.3 %   Basophil % 0.5 %   Neutrophil # 3.2 1.4-6.5 x10 3/mm    Lymphocyte # 1.1 1.0-3.6 x10 3/mm    Monocyte # 0.3 0.2-0.9 x10 3/mm    Eosinophil # 0.1 0.0-0.7 x10 3/mm    Basophil # 0.0 0.0-0.1 x10 3/mm   Comprehensive metabolic panel     Status: Abnormal   Collection Time: 02/20/15  2:18 PM  Result Value Ref Range   Glucose, CSF DNP mg/dL    Comment: 65-99 NOTE: New Reference Range  01/30/15    BUN 14 mg/dL    Comment: 6-20 NOTE: New Reference Range  01/30/15    Creatinine 1.05 (H) mg/dL    Comment: 0.44-1.00 NOTE: New  Reference Range  01/30/15    Sodium, Urine Random DNP mmol/L    Comment: 135-145 NOTE: New Reference Range  01/30/15    Potassium, Urine Random DNP mmol/L    Comment: 3.5-5.1 NOTE: New Reference Range  01/30/15    Chloride, Urine Random DNP mmol/L    Comment: 101-111 NOTE: New Reference Range  01/30/15    Co2 26 mmol/L    Comment: 22-32 NOTE: New Reference Range  01/30/15    Calcium, Total 8.7 (L) mg/dL    Comment: 8.9-10.3 NOTE: New Reference Range  01/30/15    SGOT(AST) 18 U/L    Comment: 15-41 NOTE: New Reference Range  01/30/15    SGPT (ALT) 14 U/L    Comment: 14-54 NOTE: New Reference Range  01/30/15    Alkaline Phosphatase 119 U/L    Comment: 38-126 NOTE: New Reference Range  01/30/15    Albumin 4.0 g/dL    Comment: 3.5-5.0 NOTE: New reference range  01/30/15    Total Protein 7.0 g/dL    Comment: 6.5-8.1 NOTE: New Reference Range  01/30/15    Bilirubin,Total 0.5 mg/dL    Comment: 0.3-1.2 NOTE: New Reference Range  01/30/15    Anion Gap 3 (L) 7-16   EGFR (African American) >60    EGFR (Non-African Amer.) >60     Comment: eGFR values <71m/min/1.73 m2 may be an indication of chronic kidney disease (CKD). Calculated eGFR is useful in patients with stable renal function. The eGFR calculation will not be reliable in acutely ill patients when serum creatinine is changing rapidly. It is not useful in patients on dialysis. The eGFR calculation may not be applicable to patients at the low and high extremes of body sizes, pregnant women, and vegetarians.    Glucose 113 (H) mg/dL    Comment: 65-99 NOTE: New Reference Range  01/30/15    Sodium 136 mmol/L    Comment: 135-145 NOTE: New Reference Range  01/30/15  Potassium 3.9 mmol/L    Comment: 3.5-5.1 NOTE: New Reference Range  01/30/15    Chloride 107 mmol/L    Comment: 101-111 NOTE: New Reference Range  01/30/15    Depression screen PHQ 2/9 05/01/2015  Decreased Interest 1  Down,  Depressed, Hopeless 1  PHQ - 2 Score 2  Altered sleeping 0  Tired, decreased energy 2  Change in appetite 1  Feeling bad or failure about yourself  1  Trouble concentrating 3  Moving slowly or fidgety/restless 2  Suicidal thoughts 1  PHQ-9 Score 12  Difficult doing work/chores Very difficult   Fall Risk: Fall Risk  05/01/2015  Falls in the past year? No    Assessment & Plan  1. Benign essential HTN At goal, recheck lipid panel, off Atorvastatin, we will re-calculate her ASCVD - Lipid Profile - COMPLETE METABOLIC PANEL WITH GFR  2. Bilateral low back pain with right-sided sciatica  - ondansetron (ZOFRAN) 4 MG tablet; Take 1 tablet (4 mg total) by mouth every 8 (eight) hours as needed for nausea or vomiting.  Dispense: 20 tablet; Refill: 0  3. Gastro-esophageal reflux disease without esophagitis Doing well at this time - omeprazole (PRILOSEC) 40 MG capsule; Take 1 capsule (40 mg total) by mouth daily.  Dispense: 30 capsule; Refill: 5 - ranitidine (ZANTAC) 150 MG tablet; Take 1 tablet (150 mg total) by mouth daily.  Dispense: 30 tablet; Refill: 5  4. CFIDS (chronic fatigue and immune dysfunction syndrome) Off Adderal because it was rebound fatigue, discussed life style changes  5. Anxiety and depression Continue medication, she does not want to change medications at this time, even though she is not in remission  6. Iron deficiency anemia  - Hematocrit, iron, TIBC and ferritin level, not take iron supplementation at this time, colonoscopy 3 years ago, had polyps, if still low  refer back to GI  7. Blood glucose elevated Elevated fasting glucose check - HgB A1c  8. Fibromyalgia syndrome Needs to resume taking medication three times daily  - gabapentin (NEURONTIN) 400 MG capsule; Take 1 capsule (400 mg total) by mouth 3 (three) times daily.  Dispense: 90 capsule; Refill: 5  9. Central sleep apnea She was not able to afford titration study and can't drive all the way to  [facility] to have study done  10. History of back surgery Having back pain, but explained to her she does not need more studies at this time, needs to continue medication given for FMS  11. Need for pneumococcal vaccination  - Pneumococcal polysaccharide vaccine 23-valent greater than or equal to 2yo subcutaneous/IM

## 2015-05-01 NOTE — Patient Instructions (Signed)
Chronic Fatigue Syndrome Chronic fatigue syndrome (CFS) is a condition in which there is lasting, extreme tiredness (fatigue) that does not improve with rest. CFS affects women up to four times more often than men. If you have CFS, fatigue and other symptoms can make it hard for you to get through your day. There is no treatment or cure. You will need to work closely with your health care provider to come up with a treatment plan that works for you. CAUSES  No one knows what causes CFS. It may be triggered by a flu-like illness or by mono. Other triggers may include:  An abnormal immune system.  Low blood pressure.  Poor diet.  Physical or emotional stress. SIGNS AND SYMPTOMS The main symptom is fatigue that lasts all day, especially after physical or mental stress. Other common symptoms include:  An extreme loss of energy with no obvious cause.  Muscle or joint soreness.  Severe weakness.  Frequent headaches.  Fever.  Sore throat.  Swollen lymph glands.  Sleep is not refreshing.  Loss of concentration or memory. Less common symptoms may include:  Chills.  Night sweats.  Tingling or numbness.  Blurred vision.  Dizziness.  Sensitivity to noise or odors.  Mood swings.  Anxiety, panic attacks, and depression. Your symptoms may come and go, or you may have them all the time. DIAGNOSIS  There are no tests that can help health care providers diagnose CFS. It may take a long time for you to get a correct diagnosis. Your health care provider may need to do a number of tests to rule out other conditions that could be causing your symptoms. You may be diagnosed with CFS if:  You have fatigue that has lasted for at least six months.  Your fatigue is not relieved by rest.  Your fatigue is not caused by another condition.  Your fatigue is severe enough to interfere with work and daily activities.  You have at least four common symptoms of CFS. TREATMENT  There is no  cure for CFS at this time. The condition affects everyone differently. You will need to work with your health care provider to find the best treatment for your symptoms. Treatment may include:  Improving sleep with a regular bedtime routine.  Avoiding caffeine, alcohol, and tobacco.  Doing light exercise and stretching during the day.  Taking medicine to help you sleep.  Taking over-the-counter medicines to relieve joint or muscle pain.  Learning and practicing relaxation techniques.  Using memory aids or doing brain teasers to improve memory and concentration.  Seeing a mental health professional to evaluate and treat depression, if necessary.  Trying massage therapy, acupuncture, and movement exercises, like yoga or tai chi. HOME CARE INSTRUCTIONS Work closely with your health care provider to follow your treatment plan at home. You may need to make major lifestyle changes. If treatment does not seem to help, get a second opinion. You may get help from many health care providers, including doctors, mental health specialists, physical therapists, and rehabilitation therapists. Having the support of friends and loved ones is also important. SEEK MEDICAL CARE IF:  Your symptoms are not responding to treatment.  You are having strong feelings of anger, guilt, anxiety, or depression. Document Released: 12/18/2004 Document Revised: 03/27/2014 Document Reviewed: 09/30/2013 Exit[employer] Patient Information 2015 [employer]. This information is not intended to replace advice given to you by your health care provider. Make sure you discuss any questions you have with your health care provider.  

## 2015-05-03 LAB — LIPID PANEL
CHOL/HDL RATIO: 4.6 ratio — AB (ref 0.0–4.4)
Cholesterol, Total: 252 mg/dL — ABNORMAL HIGH (ref 100–199)
HDL: 55 mg/dL (ref >39)
LDL Calculated: 155 mg/dL — ABNORMAL HIGH (ref 0–99)
TRIGLYCERIDES: 209 mg/dL — AB (ref 0–149)
VLDL CHOLESTEROL CAL: 42 mg/dL — AB (ref 5–40)

## 2015-05-03 LAB — IRON AND TIBC
IRON SATURATION: 16 % (ref 15–55)
IRON: 49 ug/dL (ref 27–159)
Total Iron Binding Capacity: 314 ug/dL (ref 250–450)
UIBC: 265 ug/dL (ref 131–425)

## 2015-05-03 LAB — HEMOGLOBIN A1C
ESTIMATED AVERAGE GLUCOSE: 123 mg/dL
Hgb A1c MFr Bld: 5.9 % — ABNORMAL HIGH (ref 4.8–5.6)

## 2015-05-03 LAB — FERRITIN: Ferritin: 14 ng/mL — ABNORMAL LOW (ref 15–150)

## 2015-05-03 LAB — HEMATOCRIT: Hematocrit: 37 % (ref 34.0–46.6)

## 2015-05-07 ENCOUNTER — Encounter: Payer: Self-pay | Admitting: Family Medicine

## 2015-05-07 ENCOUNTER — Telehealth: Payer: Self-pay | Admitting: Family Medicine

## 2015-05-07 DIAGNOSIS — R072 Precordial pain: Secondary | ICD-10-CM

## 2015-05-07 NOTE — Telephone Encounter (Signed)
Yes, referral has been made along with appointment. Called patient and left a message for her to return my call regarding the appointment details.

## 2015-05-07 NOTE — Telephone Encounter (Signed)
Has this referral been done?

## 2015-05-07 NOTE — Telephone Encounter (Signed)
Please order a referral if you are wanting her to go to a Cardiologist.

## 2015-05-07 NOTE — Telephone Encounter (Signed)
CMP lab added

## 2015-05-07 NOTE — Telephone Encounter (Signed)
-----   Message from Steele Sizer, MD sent at 05/07/2015  8:01 AM EDT ----- All labs are back except for CMP, please call [facility] and see if they can add on.  ----- Message -----    From: SYSTEM    Sent: 05/06/2015  12:04 AM      To: Steele Sizer, MD

## 2015-05-08 LAB — CMP14+EGFR
ALBUMIN: 4.3 g/dL (ref 3.5–5.5)
ALT: 13 IU/L (ref 0–32)
AST: 19 IU/L (ref 0–40)
Albumin/Globulin Ratio: 1.9 (ref 1.1–2.5)
Alkaline Phosphatase: 131 IU/L — ABNORMAL HIGH (ref 39–117)
BUN/Creatinine Ratio: 13 (ref 9–23)
BUN: 12 mg/dL (ref 6–24)
Bilirubin Total: <0.2 mg/dL (ref 0.0–1.2)
CO2: 26 mmol/L (ref 18–29)
Calcium: 9.1 mg/dL (ref 8.7–10.2)
Chloride: 101 mmol/L (ref 97–108)
Creatinine, Ser: 0.9 mg/dL (ref 0.57–1.00)
GFR, EST AFRICAN AMERICAN: 84 mL/min/{1.73_m2} (ref >59)
GFR, EST NON AFRICAN AMERICAN: 73 mL/min/{1.73_m2} (ref >59)
Globulin, Total: 2.3 g/dL (ref 1.5–4.5)
Glucose: 116 mg/dL — ABNORMAL HIGH (ref 65–99)
POTASSIUM: 4.4 mmol/L (ref 3.5–5.2)
Sodium: 144 mmol/L (ref 134–144)
Total Protein: 6.6 g/dL (ref 6.0–8.5)

## 2015-05-08 LAB — SPECIMEN STATUS REPORT

## 2015-05-23 ENCOUNTER — Other Ambulatory Visit: Payer: Self-pay | Admitting: Family Medicine

## 2015-05-23 NOTE — Telephone Encounter (Signed)
Patient requesting refill. 

## 2015-05-29 ENCOUNTER — Ambulatory Visit
Admission: RE | Admit: 2015-05-29 | Discharge: 2015-05-29 | Disposition: A | Discharge status: Home / Self Care | Payer: BLUE CROSS/BLUE SHIELD | Source: Ambulatory Visit | Attending: Family Medicine | Admitting: Family Medicine

## 2015-05-29 ENCOUNTER — Telehealth: Payer: Self-pay | Admitting: Family Medicine

## 2015-05-29 ENCOUNTER — Encounter: Payer: Self-pay | Admitting: Family Medicine

## 2015-05-29 ENCOUNTER — Other Ambulatory Visit: Payer: Self-pay | Admitting: Family Medicine

## 2015-05-29 DIAGNOSIS — R0789 Other chest pain: Secondary | ICD-10-CM

## 2015-05-29 DIAGNOSIS — Z853 Personal history of malignant neoplasm of breast: Secondary | ICD-10-CM | POA: Insufficient documentation

## 2015-05-29 DIAGNOSIS — R079 Chest pain, unspecified: Secondary | ICD-10-CM | POA: Insufficient documentation

## 2015-05-29 NOTE — Telephone Encounter (Signed)
Sent order, she will need follow up this week sometimes, after X-ray

## 2015-05-29 NOTE — Telephone Encounter (Signed)
PT IS ASKING FOR A XRAY TO HER LEFT SIDE OF HER RIBS. IS HAVING DISCOMFORT IN THE RIB SIDE AND SHAYS THAT SHE HAS HAD NOT INJURY AND THE PAIN MEDICATION IS NOT WORKING. SAYS THAT THE LAST TIME THIS HAPPENED SHE HAD SNEEZED TO HARD. YOU DO NOT HAVE ANY OPENINGS THE REST OF THE WEEK THAT WE SEE.

## 2015-05-29 NOTE — Telephone Encounter (Signed)
Patient notified that her Chest X-ray was ordered and can go to [place] to have done Monday-Friday 8 a.m. To 5 p.m. And will call with results.

## 2015-05-30 ENCOUNTER — Telehealth: Payer: Self-pay | Admitting: Family Medicine

## 2015-05-30 NOTE — Telephone Encounter (Signed)
PT IS NEEDING XRAY RESULTS SHE HAS YESTERDAY.

## 2015-05-30 NOTE — Telephone Encounter (Signed)
Pt notified about chest x-ray which was all normal.  She is still having a lot of pain she will schedule an appointment

## 2015-05-31 ENCOUNTER — Encounter: Payer: Self-pay | Admitting: Family Medicine

## 2015-06-01 ENCOUNTER — Ambulatory Visit (INDEPENDENT_AMBULATORY_CARE_PROVIDER_SITE_OTHER): Payer: BLUE CROSS/BLUE SHIELD | Admitting: Family Medicine

## 2015-06-01 ENCOUNTER — Encounter: Payer: Self-pay | Admitting: Family Medicine

## 2015-06-01 VITALS — HR 85 | Temp 98.3°F | Resp 16 | Ht 61.0 in | Wt 165.0 lb

## 2015-06-01 DIAGNOSIS — R0789 Other chest pain: Secondary | ICD-10-CM

## 2015-06-01 DIAGNOSIS — R071 Chest pain on breathing: Secondary | ICD-10-CM | POA: Diagnosis not present

## 2015-06-01 MED ORDER — LIDOCAINE-EPINEPHRINE (PF) 1 %-1:200000 IJ SOLN
10.0000 mL | Freq: Once | INTRAMUSCULAR | Status: AC
  Administered 2015-06-01: 10 mL via INTRADERMAL

## 2015-06-01 MED ORDER — TRIAMCINOLONE ACETONIDE 40 MG/ML IJ SUSP
40.0000 mg | Freq: Once | INTRAMUSCULAR | Status: AC
  Administered 2015-06-01: 40 mg via INTRAMUSCULAR

## 2015-06-01 NOTE — Progress Notes (Signed)
Name: Deanna Baker   MRN: [id number]    DOB: 1961-08-17   Date:06/01/2015       Progress Note  Subjective  Chief Complaint  Chief Complaint  Patient presents with  . Flank Pain    left upper side onset-couple of weeks, worsening, constant, no known trauma.     HPI  Chest wall pain: she has a history of rib fractures on right side two years ago. Two weeks ago she noticed a pain on left anterior chest pain with deep inspiration and chest wall movement and palpation.  Pain is getting progressively worse. She called this week and x-ray was done and there was no new fractures seen and lungs looked normal. She has been taking pain medication with temporary relief.  She also has seen cardiologist and evaluation was neg for cardiac causes of chest pain.   Patient Active Problem List   Diagnosis Date Noted  . Benign essential HTN 05/01/2015  . CFIDS (chronic fatigue and immune dysfunction syndrome) 05/01/2015  . Anxiety and depression 05/01/2015  . Dyslipidemia 05/01/2015  . Fibromyalgia syndrome 05/01/2015  . Gastro-esophageal reflux disease without esophagitis 05/01/2015  . Blood glucose elevated 05/01/2015  . Eczema intertrigo 05/01/2015  . Iron deficiency anemia 05/01/2015  . Chronic migraine without aura without status migrainosus, not intractable 05/01/2015  . Excessive urination at night 05/01/2015  . Dysmetabolic syndrome 79/12/4095  . History of colonoscopy with polypectomy 05/01/2015  . Central sleep apnea 05/01/2015  . History of back surgery 05/01/2015  . Thickened nails 05/01/2015  . Malignant neoplasm of upper-outer quadrant of female breast, triple negative 06/13/2013    History  Substance Use Topics  . Smoking status: Former Smoker -- 1.00 packs/day for 20 years    Types: Cigarettes    Quit date: 11/24/1980  . Smokeless tobacco: Never Used  . Alcohol Use: 0.0 oz/week    0 Standard drinks or equivalent per week     Comment: rarely     Current outpatient  prescriptions:  .  ALPRAZolam (XANAX) 0.5 MG tablet, TAKE 1 TABLET BY MOUTH AS NEEDED FOR ANXIETY OR SLEEP, Disp: 30 tablet, Rfl: 1 .  amLODipine-benazepril (LOTREL) 5-20 MG per capsule, Take 1 capsule by mouth daily., Disp: , Rfl:  .  buPROPion (WELLBUTRIN XL) 150 MG 24 hr tablet, Take 1 tablet by mouth daily., Disp: , Rfl: 1 .  gabapentin (NEURONTIN) 400 MG capsule, TAKE ONE CAPSULE BY MOUTH 3 TIMES A DAY, Disp: 90 capsule, Rfl: 0 .  HYDROcodone-acetaminophen (NORCO) 10-325 MG per tablet, Take 1 tablet by mouth 3 (three) times daily as needed., Disp: , Rfl:  .  meloxicam (MOBIC) 15 MG tablet, Take 15 mg by mouth as needed. , Disp: , Rfl:  .  nitroGLYCERIN (NITROSTAT) 0.4 MG SL tablet, Place 1 tablet under the tongue as needed., Disp: , Rfl:  .  omeprazole (PRILOSEC) 40 MG capsule, Take 1 capsule (40 mg total) by mouth daily., Disp: 30 capsule, Rfl: 5 .  ondansetron (ZOFRAN) 4 MG tablet, Take 1 tablet (4 mg total) by mouth every 8 (eight) hours as needed for nausea or vomiting., Disp: 20 tablet, Rfl: 0 .  PARoxetine (PAXIL) 40 MG tablet, Take 40 mg by mouth every morning. , Disp: , Rfl:  .  ranitidine (ZANTAC) 150 MG tablet, Take 1 tablet (150 mg total) by mouth daily., Disp: 30 tablet, Rfl: 5 .  traMADol (ULTRAM) 50 MG tablet, Take 50 mg by mouth every 6 (six) hours as needed for pain.,  Disp: , Rfl:   Allergies  Allergen Reactions  . Pregabalin     weight gain and blurred vision  . Venlafaxine     foggy minded    ROS  Constitutional: Negative for fever or weight change.  Respiratory: Negative for cough and shortness of breath.   Cardiovascular: positive  for chest pain or palpitations.  Gastrointestinal: Negative for abdominal pain, no bowel changes.  Musculoskeletal: Negative for gait problem or joint swelling. She has FMS and chronic muscle pain  Skin: Negative for rash.  Neurological: Negative for dizziness or headache.  No other specific complaints in a complete review of systems  (except as listed in HPI above).  Objective  Filed Vitals:   06/01/15 1207  Pulse: 85  Temp: 98.3 F (36.8 C)  TempSrc: Oral  Resp: 16  Height: $Remove'5\' 1"'IatqMtk$  (1.549 m)  Weight: 165 lb (74.844 kg)  SpO2: 97%    Body mass index is 31.19 kg/(m^2).    Physical Exam  Constitutional: Patient appears well-developed and well-nourished. No distress.  Eyes:  No scleral icterus. PERL Neck: Normal range of motion. Neck supple. Cardiovascular: Normal rate, regular rhythm and normal heart sounds.  No murmur heard. No BLE edema. Pulmonary/Chest: Effort normal and breath sounds normal. No respiratory distress. Abdominal: Soft.  There is no tenderness. Psychiatric: Patient has a normal mood and affect. behavior is normal. Judgment and thought content normal. Muscular Skeletal: trigger points positive, pain during palpation of right anterior chest wall, anterior axillary line, over ribs  Recent Results (from the past 2160 hour(s))  HgB A1c     Status: Abnormal   Collection Time: 05/02/15  9:37 AM  Result Value Ref Range   Hgb A1c MFr Bld 5.9 (H) 4.8 - 5.6 %    Comment:          Pre-diabetes: 5.7 - 6.4          Diabetes: >6.4          Glycemic control for adults with diabetes: <7.0    Est. average glucose Bld gHb Est-mCnc 123 mg/dL  Hematocrit     Status: None   Collection Time: 05/02/15  9:37 AM  Result Value Ref Range   Hematocrit 37.0 34.0 - 46.6 %  Lipid Profile     Status: Abnormal   Collection Time: 05/02/15  9:37 AM  Result Value Ref Range   Cholesterol, Total 252 (H) 100 - 199 mg/dL   Triglycerides 209 (H) 0 - 149 mg/dL   HDL 55 >39 mg/dL    Comment: According to ATP-III Guidelines, HDL-C >59 mg/dL is considered a negative risk factor for CHD.    VLDL Cholesterol Cal 42 (H) 5 - 40 mg/dL   LDL Calculated 155 (H) 0 - 99 mg/dL   Chol/HDL Ratio 4.6 (H) 0.0 - 4.4 ratio units    Comment:                                   T. Chol/HDL Ratio                                              Men  Women  1/2 Avg.Risk  3.4    3.3                                   Avg.Risk  5.0    4.4                                2X Avg.Risk  9.6    7.1                                3X Avg.Risk 23.4   11.0   Iron and TIBC     Status: None   Collection Time: 05/02/15  9:37 AM  Result Value Ref Range   Total Iron Binding Capacity 314 250 - 450 ug/dL   UIBC 265 131 - 425 ug/dL   Iron 49 27 - 159 ug/dL   Iron Saturation 16 15 - 55 %  Ferritin     Status: Abnormal   Collection Time: 05/02/15  9:37 AM  Result Value Ref Range   Ferritin 14 (L) 15 - 150 ng/mL  CMP14+EGFR     Status: Abnormal   Collection Time: 05/02/15  9:37 AM  Result Value Ref Range   Glucose 116 (H) 65 - 99 mg/dL   BUN 12 6 - 24 mg/dL   Creatinine, Ser 0.90 0.57 - 1.00 mg/dL   GFR calc non Af Amer 73 >59 mL/min/1.73   GFR calc Af Amer 84 >59 mL/min/1.73   BUN/Creatinine Ratio 13 9 - 23   Sodium 144 134 - 144 mmol/L   Potassium 4.4 3.5 - 5.2 mmol/L   Chloride 101 97 - 108 mmol/L   CO2 26 18 - 29 mmol/L   Calcium 9.1 8.7 - 10.2 mg/dL   Total Protein 6.6 6.0 - 8.5 g/dL   Albumin 4.3 3.5 - 5.5 g/dL   Globulin, Total 2.3 1.5 - 4.5 g/dL   Albumin/Globulin Ratio 1.9 1.1 - 2.5   Bilirubin Total <0.2 0.0 - 1.2 mg/dL   Alkaline Phosphatase 131 (H) 39 - 117 IU/L   AST 19 0 - 40 IU/L   ALT 13 0 - 32 IU/L  Specimen status report     Status: None   Collection Time: 05/02/15  9:37 AM  Result Value Ref Range   specimen status report Comment     Comment: Tracking Missed Test Written Authorization Written Authorization Written Authorization Received. Authorization received from original order 05-07-2015 Logged by [place]  1. Anterior chest wall pain  - Inject trigger point, 1 or 2  Consent form signed Localized muscle group Injection with lidocaine 1% and Kenalog $RemoveBef'40mg'DQstnOeQMh$ /1 ml on muscle  Patient tolerated procedure well No side effects

## 2015-06-11 ENCOUNTER — Other Ambulatory Visit: Payer: Self-pay | Admitting: Family Medicine

## 2015-06-11 ENCOUNTER — Encounter: Payer: Self-pay | Admitting: Family Medicine

## 2015-06-11 DIAGNOSIS — R0789 Other chest pain: Secondary | ICD-10-CM

## 2015-06-16 ENCOUNTER — Other Ambulatory Visit: Payer: Self-pay | Admitting: Family Medicine

## 2015-06-19 ENCOUNTER — Other Ambulatory Visit: Payer: Self-pay | Admitting: Family Medicine

## 2015-06-19 NOTE — Telephone Encounter (Signed)
Patient requesting refill. 

## 2015-06-22 ENCOUNTER — Other Ambulatory Visit: Payer: Self-pay

## 2015-06-22 DIAGNOSIS — R0789 Other chest pain: Secondary | ICD-10-CM

## 2015-06-28 ENCOUNTER — Ambulatory Visit (INDEPENDENT_AMBULATORY_CARE_PROVIDER_SITE_OTHER): Payer: BLUE CROSS/BLUE SHIELD | Admitting: General Surgery

## 2015-06-28 ENCOUNTER — Encounter: Payer: Self-pay | Admitting: General Surgery

## 2015-06-28 VITALS — BP 110/80 | HR 88 | Resp 16 | Ht 61.0 in | Wt 162.0 lb

## 2015-06-28 DIAGNOSIS — R0789 Other chest pain: Secondary | ICD-10-CM

## 2015-06-28 DIAGNOSIS — C50412 Malignant neoplasm of upper-outer quadrant of left female breast: Secondary | ICD-10-CM

## 2015-06-28 NOTE — Progress Notes (Signed)
Patient ID: Deanna Baker, female   DOB: [birth year], 54 y.o.   MRN: [id number]  Chief Complaint  Patient presents with  . Follow-up    HPI Deanna Baker is a 54 y.o. female.  She is here today for evaluation of constant left rib/chest wall pain. She has had the pain since June. Denies any falls. States the pain was a sudden onset. Described as a "burning pain" worse with deep breath. She did have a chest xray done. She does get choked on foods and has had a cough. She states Dr. Ancil Boozer did an injection which didn't last long.   HPI  Past Medical History  Diagnosis Date  . Fibromyalgia 1993  . Arthritis 1990  . Hypertension 2012  . Heart murmur   . Measles 1960  . Lump or mass in breast   . Mumps 1970  . Chronic fatigue syndrome   . Colon polyp 2013  . 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 Upper gi endoscopy      Dr. Suzette Battiest  . Shoulder surgery Left 2009  . Colonoscopy  2013    Dr. Suzette Battiest   . Tubal ligation  1982  . Tonsillectomy  1965  . Breast lumpectomy Left 12/06/12     lumpectomy with SN biopsy and power port placement...    Family History  Problem Relation Age of Onset  . Breast cancer Mother     Social History History  Substance Use Topics  . Smoking status: Former Smoker -- 1.00 packs/day for 20 years    Types: Cigarettes    Quit date: 11/24/1980  . Smokeless tobacco: Never Used  . Alcohol Use: 0.0 oz/week    0 Standard drinks or equivalent per week     Comment: rarely    Allergies  Allergen Reactions  . Pregabalin     weight gain and blurred vision  . Venlafaxine     foggy minded    Current Outpatient Prescriptions  Medication Sig Dispense Refill  . ALPRAZolam (XANAX) 0.5 MG tablet TAKE 1 TABLET BY MOUTH AS NEEDED FOR ANXIETY OR SLEEP 30 tablet 1  . amLODipine-benazepril (LOTREL) 5-20 MG per capsule Take 1  capsule by mouth daily.    Marland Kitchen buPROPion (WELLBUTRIN XL) 150 MG 24 hr tablet Take 1 tablet by mouth daily.  1  . gabapentin (NEURONTIN) 400 MG capsule TAKE ONE CAPSULE BY MOUTH 3 TIMES A DAY 90 capsule 0  . HYDROcodone-acetaminophen (NORCO) 10-325 MG per tablet Take 1 tablet by mouth 3 (three) times daily as needed.    . meloxicam (MOBIC) 15 MG tablet Take 15 mg by mouth as needed.     . nitroGLYCERIN (NITROSTAT) 0.4 MG SL tablet Place 1 tablet under the tongue as needed.    Marland Kitchen omeprazole (PRILOSEC) 40 MG capsule Take 1 capsule (40 mg total) by mouth daily. 30 capsule 5  . ondansetron (ZOFRAN) 4 MG tablet Take 1 tablet (4 mg total) by mouth every 8 (eight) hours as needed for nausea or vomiting. 20 tablet 0  . PARoxetine (PAXIL) 40 MG tablet Take 40 mg by mouth every morning.     . ranitidine (ZANTAC) 150 MG tablet Take 1 tablet (150 mg total) by mouth daily. 30 tablet 5  . traMADol (ULTRAM) 50 MG tablet Take 50 mg by mouth every 6 (six) hours as needed for pain.  No current facility-administered medications for this visit.    Review of Systems Review of Systems  Respiratory: Positive for cough.     Blood pressure 110/80, pulse 88, resp. rate 16, height _0  (1.549 m), weight 162 lb (73.483 kg).  Physical Exam Physical Exam  Constitutional: She is oriented to person, place, and time. She appears well-developed and well-nourished.  HENT:  Mouth/Throat: Oropharynx is clear and moist.  Eyes: Conjunctivae are normal. No scleral icterus.  Neck: Neck supple.  Pulmonary/Chest: Effort normal and breath sounds normal. She exhibits tenderness.    Abdominal: Soft. Normal appearance and bowel sounds are normal. There is no hepatomegaly.  Lymphadenopathy:    She has no cervical adenopathy.  Neurological: She is alert and oriented to person, place, and time.  Skin: Skin is warm and dry.  Psychiatric: Her behavior is normal.    Data Reviewed CXR and labs reviewed. No findings of concern.       Assessment    Acute left chest wall pain, not relieved with current medications or by recent local injection by Dr. Ancil Boozer Two year post treatment for left triple negative breast CA    Plan    Schedule CT scan with contrast of abdomen and chest     Patient has been scheduled for a CT chest/abdomen with contrast at [facility] for Wednesday, 07-04-15 at 9 am (arrive 8:45 am). Prep: no solids 4 hours prior but patient may have clear liquids up until exam time, pick up prep kit, and take medication list. Patient verbalizes understanding.    PCP:  Terance Hart 06/28/2015, 10:55 AM

## 2015-06-28 NOTE — Patient Instructions (Addendum)
The patient is aware to call back for any questions or concerns.        Patient has been scheduled for a CT chest/abdomen with contrast at [facility] for Wednesday, 07-04-15 at 9 am (arrive 8:45 am). Prep: no solids 4 hours prior but patient may have clear liquids up until exam time, pick up prep kit, and take medication list. Patient verbalizes understanding.

## 2015-07-04 ENCOUNTER — Encounter: Payer: Self-pay | Admitting: Family Medicine

## 2015-07-04 ENCOUNTER — Ambulatory Visit
Admission: RE | Admit: 2015-07-04 | Discharge: 2015-07-04 | Disposition: A | Discharge status: Home / Self Care | Payer: BLUE CROSS/BLUE SHIELD | Source: Ambulatory Visit | Attending: General Surgery | Admitting: General Surgery

## 2015-07-04 DIAGNOSIS — T17308A Unspecified foreign body in larynx causing other injury, initial encounter: Secondary | ICD-10-CM | POA: Insufficient documentation

## 2015-07-04 DIAGNOSIS — R0789 Other chest pain: Secondary | ICD-10-CM | POA: Insufficient documentation

## 2015-07-04 MED ORDER — IOHEXOL 300 MG/ML  SOLN
100.0000 mL | Freq: Once | INTRAMUSCULAR | Status: AC | PRN
  Administered 2015-07-04: 100 mL via INTRAVENOUS

## 2015-07-04 NOTE — Progress Notes (Signed)
Patient has an appointment for Monday, 07-09-15 to discuss CT results.

## 2015-07-09 ENCOUNTER — Encounter: Payer: Self-pay | Admitting: General Surgery

## 2015-07-09 ENCOUNTER — Ambulatory Visit (INDEPENDENT_AMBULATORY_CARE_PROVIDER_SITE_OTHER): Payer: BLUE CROSS/BLUE SHIELD | Admitting: General Surgery

## 2015-07-09 VITALS — BP 124/70 | HR 82 | Resp 16 | Ht 61.0 in | Wt 167.0 lb

## 2015-07-09 DIAGNOSIS — R0789 Other chest pain: Secondary | ICD-10-CM

## 2015-07-09 DIAGNOSIS — C50412 Malignant neoplasm of upper-outer quadrant of left female breast: Secondary | ICD-10-CM

## 2015-07-09 NOTE — Progress Notes (Signed)
This is a 54 year old female here today to discuss her chest ct done on 07/04/15. CT scan of chest and abdomen reviewed and showed no chest wall abnormality. Incidental finding of some mild thickening of distal esophagus.  Patient reports having endoscopy a few years ago and it showed esophagitis.  At present there is no apparent cause for her pain located in left anterolateral chest wall below the breast. Hesitant to try antiinflammatories in view of significant history of esophagitis. Have talked with Dr. Choksi(oncology0 and he will be seeing her soon. Follow up here as previously scheduled.

## 2015-07-09 NOTE — Patient Instructions (Signed)
Follow up appointment to be announced.  

## 2015-07-11 ENCOUNTER — Inpatient Hospital Stay: Payer: BLUE CROSS/BLUE SHIELD | Attending: Oncology | Admitting: Oncology

## 2015-07-11 VITALS — BP 153/82 | HR 81 | Temp 99.9°F | Wt 165.4 lb

## 2015-07-11 DIAGNOSIS — Z87891 Personal history of nicotine dependence: Secondary | ICD-10-CM | POA: Insufficient documentation

## 2015-07-11 DIAGNOSIS — I10 Essential (primary) hypertension: Secondary | ICD-10-CM

## 2015-07-11 DIAGNOSIS — Z9221 Personal history of antineoplastic chemotherapy: Secondary | ICD-10-CM | POA: Insufficient documentation

## 2015-07-11 DIAGNOSIS — Z171 Estrogen receptor negative status [ER-]: Secondary | ICD-10-CM | POA: Insufficient documentation

## 2015-07-11 DIAGNOSIS — C50412 Malignant neoplasm of upper-outer quadrant of left female breast: Secondary | ICD-10-CM

## 2015-07-11 DIAGNOSIS — G589 Mononeuropathy, unspecified: Secondary | ICD-10-CM

## 2015-07-11 DIAGNOSIS — R0781 Pleurodynia: Secondary | ICD-10-CM | POA: Insufficient documentation

## 2015-07-11 DIAGNOSIS — M1990 Unspecified osteoarthritis, unspecified site: Secondary | ICD-10-CM | POA: Diagnosis not present

## 2015-07-11 DIAGNOSIS — Z803 Family history of malignant neoplasm of breast: Secondary | ICD-10-CM | POA: Diagnosis not present

## 2015-07-11 DIAGNOSIS — M797 Fibromyalgia: Secondary | ICD-10-CM | POA: Diagnosis not present

## 2015-07-11 DIAGNOSIS — R5382 Chronic fatigue, unspecified: Secondary | ICD-10-CM | POA: Diagnosis not present

## 2015-07-11 DIAGNOSIS — Z853 Personal history of malignant neoplasm of breast: Secondary | ICD-10-CM | POA: Diagnosis not present

## 2015-07-11 DIAGNOSIS — Z79899 Other long term (current) drug therapy: Secondary | ICD-10-CM

## 2015-07-11 DIAGNOSIS — Z923 Personal history of irradiation: Secondary | ICD-10-CM | POA: Insufficient documentation

## 2015-07-11 MED ORDER — LIDOCAINE 5 % EX PTCH: MEDICATED_PATCH | CUTANEOUS | Status: DC

## 2015-07-11 NOTE — Progress Notes (Signed)
Patient does have living will. Former smoker.  Patient here today for pain in left rib cage area.  Worse when lying on her back, taking a deep breath,   moving or turning.  Has seen Dr. Ancil Boozer and Dr. Jamal Collin for problem.  Dr. Jamal Collin ordered CT.  Referred back to Dr. Oliva Bustard.  Patient requesting refill for Zofran and Norco.

## 2015-07-13 ENCOUNTER — Telehealth: Payer: Self-pay | Admitting: *Deleted

## 2015-07-13 DIAGNOSIS — C50919 Malignant neoplasm of unspecified site of unspecified female breast: Secondary | ICD-10-CM

## 2015-07-13 MED ORDER — CYCLOBENZAPRINE HCL 5 MG PO TABS: 5.0000 mg | ORAL_TABLET | Freq: Three times a day (TID) | ORAL | Status: DC | PRN

## 2015-07-13 NOTE — Telephone Encounter (Signed)
Insurance company denied coverage for lidoderm patches. New Rx for flexeril escribed to pharmacy per Magda Paganini, NP. Left voicemail with patient regarding change in medication.

## 2015-07-18 ENCOUNTER — Ambulatory Visit (HOSPITAL_COMMUNITY): Payer: BLUE CROSS/BLUE SHIELD

## 2015-07-18 ENCOUNTER — Other Ambulatory Visit: Payer: Self-pay | Admitting: Family Medicine

## 2015-07-18 ENCOUNTER — Encounter (HOSPITAL_COMMUNITY): Payer: BLUE CROSS/BLUE SHIELD

## 2015-07-18 ENCOUNTER — Ambulatory Visit
Admission: RE | Admit: 2015-07-18 | Discharge: 2015-07-18 | Disposition: A | Discharge status: Home / Self Care | Payer: BLUE CROSS/BLUE SHIELD | Source: Ambulatory Visit | Attending: Oncology | Admitting: Oncology

## 2015-07-18 ENCOUNTER — Ambulatory Visit: Payer: BLUE CROSS/BLUE SHIELD

## 2015-07-18 DIAGNOSIS — C50412 Malignant neoplasm of upper-outer quadrant of left female breast: Secondary | ICD-10-CM | POA: Diagnosis not present

## 2015-07-18 DIAGNOSIS — G589 Mononeuropathy, unspecified: Secondary | ICD-10-CM | POA: Diagnosis not present

## 2015-07-18 MED ORDER — TECHNETIUM TC 99M MEDRONATE IV KIT
25.0000 | PACK | Freq: Once | INTRAVENOUS | Status: AC | PRN
  Administered 2015-07-18: 23.82 via INTRAVENOUS

## 2015-07-19 ENCOUNTER — Telehealth: Payer: Self-pay | Admitting: Family Medicine

## 2015-07-19 NOTE — Telephone Encounter (Signed)
Pt would like a call back about one of her medications and the pains that she is having.

## 2015-07-22 ENCOUNTER — Encounter: Payer: Self-pay | Admitting: Oncology

## 2015-07-22 NOTE — Progress Notes (Signed)
Deanna Baker @ [facility] Telephone:([phone]) [phone]  Fax:([phone]) Welcome: 05-28-1961  MR#: [id number]  ZRA#:[id number]  Patient Care Team: Steele Sizer, MD as PCP - General (Family Medicine) Seeplaputhur Robinette Haines, MD (General Surgery) Steele Sizer, MD as Attending Physician (Family Medicine)  CHIEF COMPLAINT:  Chief Complaint  Patient presents with  . Follow-up   Oncology history   1. Carcinoma of breast (left breast biopsy) November 15, 2012. upper and outer quadrant 2. Patient underwent lumpectomyand sentinel lymph node examination.  pT1C  pN0 sen M0 stage IC, triple negative  disease.   3. Started on Cytoxan and Adriamycin from December 17, 2011.   received 4 cycles of Cytoxan and Adriamycin, February 17, 2013. 4. Patient started weekly Taxol chemotherapy from March 10, 2013. 5. finished with Taxol on June 02, 2013 6local radiation therapy 7.BRCA mutation has been reported   it be negative.  (Was done in January of 2014)    Malignant neoplasm of upper-outer quadrant of female breast, triple negative   06/13/2013 Initial Diagnosis Malignant neoplasm of upper-outer quadrant of female breast, triple negative    No flowsheet data found.  INTERVAL HISTORY:  54 year old lady with a history of left rib cage pain.  Patient has a stage 1C node negative, triple negative disease.  Patient started having left rib cage pain several weeks ago.  Cannot lie down any comfortable position.  Patient had been evaluated by primary care physician and Dr. Dorena Cookey Ward order CT scan.  No evidence of abnormality was found.  Pain is constant dull aching.  The patient is quite concerned about metastatic disease. REVIEW OF SYSTEMS:   GENERAL:  Feels good.  Active.  No fevers, sweats or weight loss. PERFORMANCE STATUS (ECOG):  01 HEENT:  No visual changes, runny nose, sore throat, mouth sores or tenderness. Lungs: No shortness of breath or cough.  No hemoptysis. Cardiac:  No chest pain,  palpitations, orthopnea, or PND. GI:  No nausea, vomiting, diarrhea, constipation, melena or hematochezia. GU:  No urgency, frequency, dysuria, or hematuria. Musculoskeletal: Musculoskeletal pain in the left rib cage area as described above Extremities:  No pain or swelling. Skin:  No rashes or skin changes. Neuro:  No headache, numbness or weakness, balance or coordination issues. Endocrine:  No diabetes, thyroid issues, hot flashes or night sweats. Psych:  No mood changes, depression or anxiety. Pain:  No focal pain. Review of systems:  All other systems reviewed and found to be negative. As per HPI. Otherwise, a complete review of systems is negatve.  PAST MEDICAL HISTORY: Past Medical History  Diagnosis Date  . Fibromyalgia 1993  . Arthritis 1990  . Hypertension 2012  . Heart murmur   . Measles 1960  . Lump or mass in breast   . Mumps 1970  . Chronic fatigue syndrome   . Colon polyp 2013  . Malignant neoplasm of upper-outer quadrant of female breast     Invasive mammary carcinoma, triple negative    PAST SURGICAL HISTORY: Past Surgical History  Procedure Laterality Date  . Upper gi endoscopy      Dr. Suzette Battiest  . Shoulder surgery Left 2009  . Colonoscopy  2013    Dr. Suzette Battiest   . Tubal ligation  1982  . Tonsillectomy  1965  . Breast lumpectomy Left 12/06/12     lumpectomy with SN biopsy and power port placement...    FAMILY HISTORY Family History  Problem Relation Age of Onset  . Breast  cancer Mother     ADVANCED DIRECTIVES:  No flowsheet data found.  HEALTH MAINTENANCE: Social History  Substance Use Topics  . Smoking status: Former Smoker -- 1.00 packs/day for 20 years    Types: Cigarettes    Quit date: 11/24/1980  . Smokeless tobacco: Never Used  . Alcohol Use: 0.0 oz/week    0 Standard drinks or equivalent per week     Comment: rarely      Allergies  Allergen Reactions  . Pregabalin     weight gain and blurred vision  . Venlafaxine      foggy minded    Current Outpatient Prescriptions  Medication Sig Dispense Refill  . ALPRAZolam (XANAX) 0.5 MG tablet TAKE 1 TABLET BY MOUTH AS NEEDED FOR ANXIETY OR SLEEP 30 tablet 1  . amLODipine-benazepril (LOTREL) 5-20 MG per capsule Take 1 capsule by mouth daily.    Marland Kitchen buPROPion (WELLBUTRIN XL) 150 MG 24 hr tablet Take 1 tablet by mouth daily.  1  . HYDROcodone-acetaminophen (NORCO) 10-325 MG per tablet Take 1 tablet by mouth 3 (three) times daily as needed.    . meloxicam (MOBIC) 15 MG tablet Take 15 mg by mouth as needed.     . nitroGLYCERIN (NITROSTAT) 0.4 MG SL tablet Place 1 tablet under the tongue as needed.    Marland Kitchen omeprazole (PRILOSEC) 40 MG capsule Take 1 capsule (40 mg total) by mouth daily. 30 capsule 5  . ondansetron (ZOFRAN) 4 MG tablet Take 1 tablet (4 mg total) by mouth every 8 (eight) hours as needed for nausea or vomiting. 20 tablet 0  . PARoxetine (PAXIL) 40 MG tablet Take 40 mg by mouth every morning.     . ranitidine (ZANTAC) 150 MG tablet Take 1 tablet (150 mg total) by mouth daily. 30 tablet 5  . traMADol (ULTRAM) 50 MG tablet Take 50 mg by mouth every 6 (six) hours as needed for pain.    . cyclobenzaprine (FLEXERIL) 5 MG tablet Take 1 tablet (5 mg total) by mouth 3 (three) times daily as needed for muscle spasms. 30 tablet 0  . gabapentin (NEURONTIN) 400 MG capsule TAKE ONE CAPSULE BY MOUTH 3 TIMES A DAY 90 capsule 1  . lidocaine (LIDODERM) 5 % Apply patch for 12 hours then take off for 12 hours 30 patch 0   No current facility-administered medications for this visit.    OBJECTIVE:  Filed Vitals:   07/11/15 1434  BP: 153/82  Pulse: 81  Temp: 99.9 F (37.7 C)     Body mass index is 31.26 kg/(m^2).    ECOG FS:1 - Symptomatic but completely ambulatory  PHYSICAL EXAM: Goal status: Performance status is good.  Patient has not lost significant weight HEENT: No evidence of stomatitis. Sclera and conjunctivae :: No jaundice.   pale looking. Lungs: Air  entry equal  on both sides.  No rhonchi.  No rales.  Cardiac: Heart sounds are normal.  No pericardial rub.  No murmur. Lymphatic system: Cervical, axillary, inguinal, lymph nodes not palpable GI: Abdomen is soft.  No ascites.  Liver spleen not palpable.  No tenderness.  Bowel sounds are within normal limit Lower extremity: No edema Neurological system: Higher functions, cranial nerves intact no evidence of peripheral neuropathy. Skin: No rash.  No ecchymosis.. Examination of both breast within normal limit Examination of musculoskeletal system no abnormality Tenderness in the left rib cage area   LAB RESULTS:  CBC Latest Ref Rng 05/02/2015 02/20/2015  WBC 3.6-11.0 x10 3/mm  - 4.8  Hemoglobin 12.0-16.0 g/dL - 11.3(L)  Hematocrit 34.0 - 46.6 % 37.0 34.3(L)  Platelets 150-440 x10 3/mm  - 209    No visits with results within 5 Day(s) from this visit. Latest known visit with results is:  Office Visit on 05/01/2015  Component Date Value Ref Range Status  . Hgb A1c MFr Bld 05/02/2015 5.9* 4.8 - 5.6 % Final   Comment:          Pre-diabetes: 5.7 - 6.4          Diabetes: >6.4          Glycemic control for adults with diabetes: <7.0   . Est. average glucose Bld gHb Est-m* 05/02/2015 123   Final  . Hematocrit 05/02/2015 37.0  34.0 - 46.6 % Final  . Cholesterol, Total 05/02/2015 252* 100 - 199 mg/dL Final  . Triglycerides 05/02/2015 209* 0 - 149 mg/dL Final  . HDL 05/02/2015 55  >39 mg/dL Final   Comment: According to ATP-III Guidelines, HDL-C >59 mg/dL is considered a negative risk factor for CHD.   Marland Kitchen VLDL Cholesterol Cal 05/02/2015 42* 5 - 40 mg/dL Final  . LDL Calculated 05/02/2015 155* 0 - 99 mg/dL Final  . Chol/HDL Ratio 05/02/2015 4.6* 0.0 - 4.4 ratio units Final   Comment:                                   T. Chol/HDL Ratio                                             Men  Women                               1/2 Avg.Risk  3.4    3.3                                   Avg.Risk  5.0    4.4                                 2X Avg.Risk  9.6    7.1                                3X Avg.Risk 23.4   11.0   . Total Iron Binding Capacity 05/02/2015 314  250 - 450 ug/dL Final  . UIBC 05/02/2015 265  131 - 425 ug/dL Final  . Iron 05/02/2015 49  27 - 159 ug/dL Final  . Iron Saturation 05/02/2015 16  15 - 55 % Final  . Ferritin 05/02/2015 14* 15 - 150 ng/mL Final  . Glucose 05/02/2015 116* 65 - 99 mg/dL Final  . BUN 05/02/2015 12  6 - 24 mg/dL Final  . Creatinine, Ser 05/02/2015 0.90  0.57 - 1.00 mg/dL Final  . GFR calc non Af Amer 05/02/2015 73  >59 mL/min/1.73 Final  . GFR calc Af Amer 05/02/2015 84  >59 mL/min/1.73 Final  . BUN/Creatinine Ratio 05/02/2015 13  9 - 23 Final  . Sodium 05/02/2015 144  134 - 144 mmol/L Final  .  Potassium 05/02/2015 4.4  3.5 - 5.2 mmol/L Final  . Chloride 05/02/2015 101  97 - 108 mmol/L Final  . CO2 05/02/2015 26  18 - 29 mmol/L Final  . Calcium 05/02/2015 9.1  8.7 - 10.2 mg/dL Final  . Total Protein 05/02/2015 6.6  6.0 - 8.5 g/dL Final  . Albumin 05/02/2015 4.3  3.5 - 5.5 g/dL Final  . Globulin, Total 05/02/2015 2.3  1.5 - 4.5 g/dL Final  . Albumin/Globulin Ratio 05/02/2015 1.9  1.1 - 2.5 Final  . Bilirubin Total 05/02/2015 <0.2  0.0 - 1.2 mg/dL Final  . Alkaline Phosphatase 05/02/2015 131* 39 - 117 IU/L Final  . AST 05/02/2015 19  0 - 40 IU/L Final  . ALT 05/02/2015 13  0 - 32 IU/L Final  . specimen status report 05/02/2015 Comment   Final   Comment: Tracking Missed Test Written Authorization Written Authorization Written Authorization Received. Authorization received from original order 05-07-2015 Logged by Brain Hilts        STUDIES: Ct Chest W Contrast  07/04/2015   CLINICAL DATA:  Initial encounter for left lateral chest and left abdominal flank pain for 1 month. History of left-sided breast cancer.  EXAM: CT CHEST, ABDOMEN, AND PELVIS WITH CONTRAST  TECHNIQUE: Multidetector CT imaging of the chest, abdomen and pelvis was performed  following the standard protocol during bolus administration of intravenous contrast.  CONTRAST:  165m OMNIPAQUE IOHEXOL 300 MG/ML  SOLN  COMPARISON:  None.  FINDINGS: CT CHEST FINDINGS  Mediastinum/Nodes: There is no axillary lymphadenopathy. No mediastinal lymphadenopathy. There is no axillary lymphadenopathy. Coronary artery calcification is noted. The heart size is normal. No pericardial effusion.  Mild circumferential wall thickening is noted in the distal esophagus.  Lungs/Pleura: No focal airspace consolidation. No pulmonary edema or right pleural effusion. Tiny left pleural effusion is evident. No evidence for pulmonary nodule or mass lesion.  Musculoskeletal: Old right-sided rib fractures noted. Bone windows reveal no worrisome lytic or sclerotic osseous lesions.  CT ABDOMEN AND PELVIS FINDINGS  Hepatobiliary: No focal abnormality within the liver parenchyma. There is no evidence for gallstones, gallbladder wall thickening, or pericholecystic fluid. No intrahepatic or extrahepatic biliary dilation.  Pancreas: No focal mass lesion. No dilatation of the main duct. No intraparenchymal cyst. No peripancreatic edema.  Spleen: No splenomegaly. No focal mass lesion.  Adrenals/Urinary Tract: No adrenal nodule or mass. Kidneys are unremarkable.  Stomach/Bowel: Stomach is nondistended. There is some posterior gastric wall thickening which may be related to under distention. No evidence of outlet obstruction. Duodenum is normally positioned as is the ligament of Treitz. No evidence for small bowel obstruction. Prominent stool volume noted in the visualized portions of the colon. The terminal ileum is normal.  Vascular/Lymphatic: There is abdominal aortic atherosclerosis without aneurysm. No abdominal lymphadenopathy  Other: No intraperitoneal free fluid.  Musculoskeletal: Bone windows reveal no worrisome lytic or sclerotic osseous lesions.  IMPRESSION: 1. Mild circumferential wall thickening in the distal esophagus  with potential wall thickening in the posterior stomach. Changes in the distal esophagus may be infectious or inflammatory, but consider upper GI series or upper endoscopy to exclude neoplasm. Wall thickening in the proximal stomach is likely related to underdistention and could also be evaluated on followup assessment.   Electronically Signed   By: EMisty StanleyM.D.   On: 07/04/2015 09:57   Ct Abdomen W Contrast  07/04/2015   CLINICAL DATA:  Initial encounter for left lateral chest and left abdominal flank pain for 1 month. History of  left-sided breast cancer.  EXAM: CT CHEST, ABDOMEN, AND PELVIS WITH CONTRAST  TECHNIQUE: Multidetector CT imaging of the chest, abdomen and pelvis was performed following the standard protocol during bolus administration of intravenous contrast.  CONTRAST:  162m OMNIPAQUE IOHEXOL 300 MG/ML  SOLN  COMPARISON:  None.  FINDINGS: CT CHEST FINDINGS  Mediastinum/Nodes: There is no axillary lymphadenopathy. No mediastinal lymphadenopathy. There is no axillary lymphadenopathy. Coronary artery calcification is noted. The heart size is normal. No pericardial effusion.  Mild circumferential wall thickening is noted in the distal esophagus.  Lungs/Pleura: No focal airspace consolidation. No pulmonary edema or right pleural effusion. Tiny left pleural effusion is evident. No evidence for pulmonary nodule or mass lesion.  Musculoskeletal: Old right-sided rib fractures noted. Bone windows reveal no worrisome lytic or sclerotic osseous lesions.  CT ABDOMEN AND PELVIS FINDINGS  Hepatobiliary: No focal abnormality within the liver parenchyma. There is no evidence for gallstones, gallbladder wall thickening, or pericholecystic fluid. No intrahepatic or extrahepatic biliary dilation.  Pancreas: No focal mass lesion. No dilatation of the main duct. No intraparenchymal cyst. No peripancreatic edema.  Spleen: No splenomegaly. No focal mass lesion.  Adrenals/Urinary Tract: No adrenal nodule or mass.  Kidneys are unremarkable.  Stomach/Bowel: Stomach is nondistended. There is some posterior gastric wall thickening which may be related to under distention. No evidence of outlet obstruction. Duodenum is normally positioned as is the ligament of Treitz. No evidence for small bowel obstruction. Prominent stool volume noted in the visualized portions of the colon. The terminal ileum is normal.  Vascular/Lymphatic: There is abdominal aortic atherosclerosis without aneurysm. No abdominal lymphadenopathy  Other: No intraperitoneal free fluid.  Musculoskeletal: Bone windows reveal no worrisome lytic or sclerotic osseous lesions.  IMPRESSION: 1. Mild circumferential wall thickening in the distal esophagus with potential wall thickening in the posterior stomach. Changes in the distal esophagus may be infectious or inflammatory, but consider upper GI series or upper endoscopy to exclude neoplasm. Wall thickening in the proximal stomach is likely related to underdistention and could also be evaluated on followup assessment.   Electronically Signed   By: EMisty StanleyM.D.   On: 07/04/2015 09:57   Nm Bone Scan Whole Body  07/18/2015   CLINICAL DATA:  Breast cancer.  EXAM: NUCLEAR MEDICINE WHOLE BODY BONE SCAN  TECHNIQUE: Whole body anterior and posterior images were obtained approximately 3 hours after intravenous injection of radiopharmaceutical.  RADIOPHARMACEUTICALS:  23.8 mCi Technetium-982mDP IV  COMPARISON:  CT 07/04/2015 .  FINDINGS: Bilateral renal function is present. Multiple areas of increased activity noted over lower left anterior ribs and the right anterior third rib. These changes most likely related to prior fractures. Bilateral rib series suggested for further evaluation . No other focal abnormality identified.  IMPRESSION: 1. Multiple left mid and lower anterior rib and right anterior third rib foci of increased activity, most likely related to prior fractures. Bilateral rib series suggest confirmation.  2. No other significant abnormality identified.   Electronically Signed   By: ThMarcello MooresRegister   On: 07/18/2015 12:51    ASSESSMENT: Lower left rib cage pain.  Etiology is not clear.  Bone scan has been ordered.  CT scan of the chest has been reviewed independently no abnormality was found  MEDICAL DECISION MAKING:  T scan has been reviewed independently  A bone scan has been ordered.   Depending on the bone scan result possibility of PET scan versus just management of pain and reevaluate patient in 3 months would be considered  Clinical examination there is no evidence of recurrent or progressive disease  Patient expressed understanding and was in agreement with this plan. She also understands that She can call clinic at any time with any questions, concerns, or complaints.    No matching staging information was found for the patient.  Forest Gleason, MD   07/22/2015 9:07 PM

## 2015-07-25 ENCOUNTER — Inpatient Hospital Stay (HOSPITAL_BASED_OUTPATIENT_CLINIC_OR_DEPARTMENT_OTHER): Payer: BLUE CROSS/BLUE SHIELD | Admitting: Oncology

## 2015-07-25 VITALS — BP 100/81 | HR 80 | Wt 165.5 lb

## 2015-07-25 DIAGNOSIS — I10 Essential (primary) hypertension: Secondary | ICD-10-CM

## 2015-07-25 DIAGNOSIS — Z923 Personal history of irradiation: Secondary | ICD-10-CM

## 2015-07-25 DIAGNOSIS — Z79899 Other long term (current) drug therapy: Secondary | ICD-10-CM

## 2015-07-25 DIAGNOSIS — R5382 Chronic fatigue, unspecified: Secondary | ICD-10-CM

## 2015-07-25 DIAGNOSIS — Z9221 Personal history of antineoplastic chemotherapy: Secondary | ICD-10-CM

## 2015-07-25 DIAGNOSIS — M1990 Unspecified osteoarthritis, unspecified site: Secondary | ICD-10-CM

## 2015-07-25 DIAGNOSIS — Z853 Personal history of malignant neoplasm of breast: Secondary | ICD-10-CM

## 2015-07-25 DIAGNOSIS — R0781 Pleurodynia: Secondary | ICD-10-CM

## 2015-07-25 DIAGNOSIS — Z803 Family history of malignant neoplasm of breast: Secondary | ICD-10-CM

## 2015-07-25 DIAGNOSIS — Z171 Estrogen receptor negative status [ER-]: Secondary | ICD-10-CM

## 2015-07-25 DIAGNOSIS — Z87891 Personal history of nicotine dependence: Secondary | ICD-10-CM

## 2015-07-25 DIAGNOSIS — M797 Fibromyalgia: Secondary | ICD-10-CM

## 2015-07-25 DIAGNOSIS — C50412 Malignant neoplasm of upper-outer quadrant of left female breast: Secondary | ICD-10-CM

## 2015-07-25 NOTE — Progress Notes (Signed)
Patient does have living will.  Former smoker. Patient complaining of left rib cage pain that is increased with taking a deep breath, sneezing or coughing.  Patient has had X-ay, CT and bone scan.  Hx of breast cancer on same side.

## 2015-07-29 ENCOUNTER — Encounter: Payer: Self-pay | Admitting: Oncology

## 2015-07-29 NOTE — Progress Notes (Signed)
[place] @ [facility] Telephone:([phone]) [phone]  Fax:(336) [place]: 02-Jan-1961  MR#: [id number]  NOB#:[id number]  Patient Care Team: Steele Sizer, MD as PCP - General (Family Medicine) Seeplaputhur Robinette Haines, MD (General Surgery) Steele Sizer, MD as Attending Physician (Family Medicine)  CHIEF COMPLAINT:  Chief Complaint  Patient presents with  . Follow-up   Oncology history   1. Carcinoma of breast (left breast biopsy) November 15, 2012. upper and outer quadrant 2. Patient underwent lumpectomyand sentinel lymph node examination.  pT1C  pN0 sen M0 stage IC, triple negative  disease.   3. Started on Cytoxan and Adriamycin from December 17, 2011.   received 4 cycles of Cytoxan and Adriamycin, February 17, 2013. 4. Patient started weekly Taxol chemotherapy from March 10, 2013. 5. finished with Taxol on June 02, 2013 6local radiation therapy 7.BRCA mutation has been reported   it be negative.  (Was done in January of 2014)    Malignant neoplasm of upper-outer quadrant of female breast, triple negative   06/13/2013 Initial Diagnosis Malignant neoplasm of upper-outer quadrant of female breast, triple negative    No flowsheet data found.  INTERVAL HISTORY:  54 year old lady with a history of left rib cage pain.  Patient has a stage 1C node negative, triple negative disease.  Patient started having left rib cage pain several weeks ago.  Cannot lie down any comfortable position.  Patient had been evaluated by primary care physician and Dr. Dorena Cookey Ward order CT scan.  No evidence of abnormality was found.  Pain is constant dull aching.  The patient is quite concerned about metastatic disease. July 25, 2015 Patient is here for ongoing evaluation and treatment consideration.  Patient's pain is gradually getting better now pain is mainly related to movement.  Patient had a bone scan and here to the reviewed the results and further planning of treatment REVIEW OF  SYSTEMS:   GENERAL:  Feels good.  Active.  No fevers, sweats or weight loss. PERFORMANCE STATUS (ECOG):  01 HEENT:  No visual changes, runny nose, sore throat, mouth sores or tenderness. Lungs: No shortness of breath or cough.  No hemoptysis. Cardiac:  No chest pain, palpitations, orthopnea, or PND. GI:  No nausea, vomiting, diarrhea, constipation, melena or hematochezia. GU:  No urgency, frequency, dysuria, or hematuria. Musculoskeletal: Musculoskeletal pain in the left rib cage area as described above Extremities:  No pain or swelling. Skin:  No rashes or skin changes. Neuro:  No headache, numbness or weakness, balance or coordination issues. Endocrine:  No diabetes, thyroid issues, hot flashes or night sweats. Psych:  No mood changes, depression or anxiety. Pain:  No focal pain. Review of systems:  All other systems reviewed and found to be negative. As per HPI. Otherwise, a complete review of systems is negatve.  PAST MEDICAL HISTORY: Past Medical History  Diagnosis Date  . Fibromyalgia 1993  . Arthritis 1990  . Hypertension 2012  . Heart murmur   . Measles 1960  . Lump or mass in breast   . Mumps 1970  . Chronic fatigue syndrome   . Colon polyp 2013  . Malignant neoplasm of upper-outer quadrant of female breast     Invasive mammary carcinoma, triple negative    PAST SURGICAL HISTORY: Past Surgical History  Procedure Laterality Date  . Upper gi endoscopy      Dr. Suzette Battiest  . Shoulder surgery Left 2009  . Colonoscopy  2013    Dr. Suzette Battiest   .  Tubal ligation  1982  . Tonsillectomy  1965  . Breast lumpectomy Left 12/06/12     lumpectomy with SN biopsy and power port placement...    FAMILY HISTORY Family History  Problem Relation Age of Onset  . Breast cancer Mother     ADVANCED DIRECTIVES:  No flowsheet data found.  HEALTH MAINTENANCE: Social History  Substance Use Topics  . Smoking status: Former Smoker -- 1.00 packs/day for 20 years    Types:  Cigarettes    Quit date: 11/24/1980  . Smokeless tobacco: Never Used  . Alcohol Use: 0.0 oz/week    0 Standard drinks or equivalent per week     Comment: rarely      Allergies  Allergen Reactions  . Pregabalin     weight gain and blurred vision  . Venlafaxine     foggy minded    Current Outpatient Prescriptions  Medication Sig Dispense Refill  . ALPRAZolam (XANAX) 0.5 MG tablet TAKE 1 TABLET BY MOUTH AS NEEDED FOR ANXIETY OR SLEEP 30 tablet 1  . amLODipine-benazepril (LOTREL) 5-20 MG per capsule Take 1 capsule by mouth daily.    Marland Kitchen buPROPion (WELLBUTRIN XL) 150 MG 24 hr tablet Take 1 tablet by mouth daily.  1  . gabapentin (NEURONTIN) 400 MG capsule TAKE ONE CAPSULE BY MOUTH 3 TIMES A DAY 90 capsule 1  . HYDROcodone-acetaminophen (NORCO) 10-325 MG per tablet Take 1 tablet by mouth 3 (three) times daily as needed.    . meloxicam (MOBIC) 15 MG tablet Take 15 mg by mouth as needed.     . nitroGLYCERIN (NITROSTAT) 0.4 MG SL tablet Place 1 tablet under the tongue as needed.    Marland Kitchen omeprazole (PRILOSEC) 40 MG capsule Take 1 capsule (40 mg total) by mouth daily. 30 capsule 5  . PARoxetine (PAXIL) 40 MG tablet Take 40 mg by mouth every morning.     . ranitidine (ZANTAC) 150 MG tablet Take 1 tablet (150 mg total) by mouth daily. 30 tablet 5  . traMADol (ULTRAM) 50 MG tablet Take 50 mg by mouth every 6 (six) hours as needed for pain.    . cyclobenzaprine (FLEXERIL) 5 MG tablet Take 1 tablet (5 mg total) by mouth 3 (three) times daily as needed for muscle spasms. (Patient not taking: Reported on 07/25/2015) 30 tablet 0  . lidocaine (LIDODERM) 5 % Apply patch for 12 hours then take off for 12 hours (Patient not taking: Reported on 07/25/2015) 30 patch 0  . ondansetron (ZOFRAN) 4 MG tablet Take 1 tablet (4 mg total) by mouth every 8 (eight) hours as needed for nausea or vomiting. (Patient not taking: Reported on 07/25/2015) 20 tablet 0   No current facility-administered medications for this visit.     OBJECTIVE:  Filed Vitals:   07/25/15 1544  BP: 100/81  Pulse: 80     Body mass index is 31.29 kg/(m^2).    ECOG FS:1 - Symptomatic but completely ambulatory  PHYSICAL EXAM: Goal status: Performance status is good.  Patient has not lost significant weight HEENT: No evidence of stomatitis. Sclera and conjunctivae :: No jaundice.   pale looking. Lungs: Air  entry equal on both sides.  No rhonchi.  No rales.  Cardiac: Heart sounds are normal.  No pericardial rub.  No murmur. Lymphatic system: Cervical, axillary, inguinal, lymph nodes not palpable GI: Abdomen is soft.  No ascites.  Liver spleen not palpable.  No tenderness.  Bowel sounds are within normal limit Lower extremity: No edema Neurological system:  Higher functions, cranial nerves intact no evidence of peripheral neuropathy. Skin: No rash.  No ecchymosis.. Examination of both breast within normal limit Examination of musculoskeletal system no abnormality Tenderness in the left rib cage area   LAB RESULTS:  CBC Latest Ref Rng 05/02/2015 02/20/2015  WBC 3.6-11.0 x10 3/mm  - 4.8  Hemoglobin 12.0-16.0 g/dL - 11.3(L)  Hematocrit 34.0 - 46.6 % 37.0 34.3(L)  Platelets 150-440 x10 3/mm  - 209    No visits with results within 5 Day(s) from this visit. Latest known visit with results is:  Office Visit on 05/01/2015  Component Date Value Ref Range Status  . Hgb A1c MFr Bld 05/02/2015 5.9* 4.8 - 5.6 % Final   Comment:          Pre-diabetes: 5.7 - 6.4          Diabetes: >6.4          Glycemic control for adults with diabetes: <7.0   . Est. average glucose Bld gHb Est-m* 05/02/2015 123   Final  . Hematocrit 05/02/2015 37.0  34.0 - 46.6 % Final  . Cholesterol, Total 05/02/2015 252* 100 - 199 mg/dL Final  . Triglycerides 05/02/2015 209* 0 - 149 mg/dL Final  . HDL 05/02/2015 55  >39 mg/dL Final   Comment: According to ATP-III Guidelines, HDL-C >59 mg/dL is considered a negative risk factor for CHD.   Marland Kitchen VLDL Cholesterol Cal  05/02/2015 42* 5 - 40 mg/dL Final  . LDL Calculated 05/02/2015 155* 0 - 99 mg/dL Final  . Chol/HDL Ratio 05/02/2015 4.6* 0.0 - 4.4 ratio units Final   Comment:                                   T. Chol/HDL Ratio                                             Men  Women                               1/2 Avg.Risk  3.4    3.3                                   Avg.Risk  5.0    4.4                                2X Avg.Risk  9.6    7.1                                3X Avg.Risk 23.4   11.0   . Total Iron Binding Capacity 05/02/2015 314  250 - 450 ug/dL Final  . UIBC 05/02/2015 265  131 - 425 ug/dL Final  . Iron 05/02/2015 49  27 - 159 ug/dL Final  . Iron Saturation 05/02/2015 16  15 - 55 % Final  . Ferritin 05/02/2015 14* 15 - 150 ng/mL Final  . Glucose 05/02/2015 116* 65 - 99 mg/dL Final  . BUN 05/02/2015 12  6 - 24 mg/dL Final  . Creatinine, Ser  05/02/2015 0.90  0.57 - 1.00 mg/dL Final  . GFR calc non Af Amer 05/02/2015 73  >59 mL/min/1.73 Final  . GFR calc Af Amer 05/02/2015 84  >59 mL/min/1.73 Final  . BUN/Creatinine Ratio 05/02/2015 13  9 - 23 Final  . Sodium 05/02/2015 144  134 - 144 mmol/L Final  . Potassium 05/02/2015 4.4  3.5 - 5.2 mmol/L Final  . Chloride 05/02/2015 101  97 - 108 mmol/L Final  . CO2 05/02/2015 26  18 - 29 mmol/L Final  . Calcium 05/02/2015 9.1  8.7 - 10.2 mg/dL Final  . Total Protein 05/02/2015 6.6  6.0 - 8.5 g/dL Final  . Albumin 05/02/2015 4.3  3.5 - 5.5 g/dL Final  . Globulin, Total 05/02/2015 2.3  1.5 - 4.5 g/dL Final  . Albumin/Globulin Ratio 05/02/2015 1.9  1.1 - 2.5 Final  . Bilirubin Total 05/02/2015 <0.2  0.0 - 1.2 mg/dL Final  . Alkaline Phosphatase 05/02/2015 131* 39 - 117 IU/L Final  . AST 05/02/2015 19  0 - 40 IU/L Final  . ALT 05/02/2015 13  0 - 32 IU/L Final  . specimen status report 05/02/2015 Comment   Final   Comment: Tracking Missed Test Written Authorization Written Authorization Written Authorization Received. Authorization received from  original order 05-07-2015 Logged by Brain Hilts        STUDIES: Ct Chest W Contrast  07/04/2015   CLINICAL DATA:  Initial encounter for left lateral chest and left abdominal flank pain for 1 month. History of left-sided breast cancer.  EXAM: CT CHEST, ABDOMEN, AND PELVIS WITH CONTRAST  TECHNIQUE: Multidetector CT imaging of the chest, abdomen and pelvis was performed following the standard protocol during bolus administration of intravenous contrast.  CONTRAST:  157mL OMNIPAQUE IOHEXOL 300 MG/ML  SOLN  COMPARISON:  None.  FINDINGS: CT CHEST FINDINGS  Mediastinum/Nodes: There is no axillary lymphadenopathy. No mediastinal lymphadenopathy. There is no axillary lymphadenopathy. Coronary artery calcification is noted. The heart size is normal. No pericardial effusion.  Mild circumferential wall thickening is noted in the distal esophagus.  Lungs/Pleura: No focal airspace consolidation. No pulmonary edema or right pleural effusion. Tiny left pleural effusion is evident. No evidence for pulmonary nodule or mass lesion.  Musculoskeletal: Old right-sided rib fractures noted. Bone windows reveal no worrisome lytic or sclerotic osseous lesions.  CT ABDOMEN AND PELVIS FINDINGS  Hepatobiliary: No focal abnormality within the liver parenchyma. There is no evidence for gallstones, gallbladder wall thickening, or pericholecystic fluid. No intrahepatic or extrahepatic biliary dilation.  Pancreas: No focal mass lesion. No dilatation of the main duct. No intraparenchymal cyst. No peripancreatic edema.  Spleen: No splenomegaly. No focal mass lesion.  Adrenals/Urinary Tract: No adrenal nodule or mass. Kidneys are unremarkable.  Stomach/Bowel: Stomach is nondistended. There is some posterior gastric wall thickening which may be related to under distention. No evidence of outlet obstruction. Duodenum is normally positioned as is the ligament of Treitz. No evidence for small bowel obstruction. Prominent stool volume noted in  the visualized portions of the colon. The terminal ileum is normal.  Vascular/Lymphatic: There is abdominal aortic atherosclerosis without aneurysm. No abdominal lymphadenopathy  Other: No intraperitoneal free fluid.  Musculoskeletal: Bone windows reveal no worrisome lytic or sclerotic osseous lesions.  IMPRESSION: 1. Mild circumferential wall thickening in the distal esophagus with potential wall thickening in the posterior stomach. Changes in the distal esophagus may be infectious or inflammatory, but consider upper GI series or upper endoscopy to exclude neoplasm. Wall thickening in the proximal stomach is  likely related to underdistention and could also be evaluated on followup assessment.   Electronically Signed   By: Kennith Center M.D.   On: 07/04/2015 09:57   Ct Abdomen W Contrast  07/04/2015   CLINICAL DATA:  Initial encounter for left lateral chest and left abdominal flank pain for 1 month. History of left-sided breast cancer.  EXAM: CT CHEST, ABDOMEN, AND PELVIS WITH CONTRAST  TECHNIQUE: Multidetector CT imaging of the chest, abdomen and pelvis was performed following the standard protocol during bolus administration of intravenous contrast.  CONTRAST:  OMNIPAQUE IOHEXOL 300 MG/ML  SOLN  COMPARISON:  None.  FINDINGS: CT CHEST FINDINGS  Mediastinum/Nodes: There is no axillary lymphadenopathy. No mediastinal lymphadenopathy. There is no axillary lymphadenopathy. Coronary artery calcification is noted. The heart size is normal. No pericardial effusion.  Mild circumferential wall thickening is noted in the distal esophagus.  Lungs/Pleura: No focal airspace consolidation. No pulmonary edema or right pleural effusion. Tiny left pleural effusion is evident. No evidence for pulmonary nodule or mass lesion.  Musculoskeletal: Old right-sided rib fractures noted. Bone windows reveal no worrisome lytic or sclerotic osseous lesions.  CT ABDOMEN AND PELVIS FINDINGS  Hepatobiliary: No focal abnormality within  the liver parenchyma. There is no evidence for gallstones, gallbladder wall thickening, or pericholecystic fluid. No intrahepatic or extrahepatic biliary dilation.  Pancreas: No focal mass lesion. No dilatation of the main duct. No intraparenchymal cyst. No peripancreatic edema.  Spleen: No splenomegaly. No focal mass lesion.  Adrenals/Urinary Tract: No adrenal nodule or mass. Kidneys are unremarkable.  Stomach/Bowel: Stomach is nondistended. There is some posterior gastric wall thickening which may be related to under distention. No evidence of outlet obstruction. Duodenum is normally positioned as is the ligament of Treitz. No evidence for small bowel obstruction. Prominent stool volume noted in the visualized portions of the colon. The terminal ileum is normal.  Vascular/Lymphatic: There is abdominal aortic atherosclerosis without aneurysm. No abdominal lymphadenopathy  Other: No intraperitoneal free fluid.  Musculoskeletal: Bone windows reveal no worrisome lytic or sclerotic osseous lesions.  IMPRESSION: 1. Mild circumferential wall thickening in the distal esophagus with potential wall thickening in the posterior stomach. Changes in the distal esophagus may be infectious or inflammatory, but consider upper GI series or upper endoscopy to exclude neoplasm. Wall thickening in the proximal stomach is likely related to underdistention and could also be evaluated on followup assessment.   Electronically Signed   By: Kennith Center M.D.   On: 07/04/2015 09:57   Nm Bone Scan Whole Body  07/18/2015   CLINICAL DATA:  Breast cancer.  EXAM: NUCLEAR MEDICINE WHOLE BODY BONE SCAN  TECHNIQUE: Whole body anterior and posterior images were obtained approximately 3 hours after intravenous injection of radiopharmaceutical.  RADIOPHARMACEUTICALS:  23.8 mCi Technetium-35m MDP IV  COMPARISON:  CT 07/04/2015 .  FINDINGS: Bilateral renal function is present. Multiple areas of increased activity noted over lower left anterior ribs  and the right anterior third rib. These changes most likely related to prior fractures. Bilateral rib series suggested for further evaluation . No other focal abnormality identified.  IMPRESSION: 1. Multiple left mid and lower anterior rib and right anterior third rib foci of increased activity, most likely related to prior fractures. Bilateral rib series suggest confirmation. 2. No other significant abnormality identified.   Electronically Signed   By: Maisie Fus  Register   On: 07/18/2015 12:51    ASSESSMENT: Lower left rib cage pain.  Etiology is not clear.  Bone scan has been ordered.  CT scan of the chest has been reviewed independently no abnormality was found Bone scan has been reviewed shows multiple symmetrical area of increased uptake in the left ribs and right rib suggesting fracture  MEDICAL DECISION MAKING:  T scan has been reviewed independently  Had prolonged discussion with patient that the pain does not get better then we have to get a PET scan done.  Bone scan the pieces suggestive of traumatic fracture however considering patient's history of triple negative disease metastases and needs to be considered.  Pain is getting better.  Reevaluation in 4 weeks. Clinical examination there is no evidence of recurrent or progressive disease  Patient expressed understanding and was in agreement with this plan. She also understands that She can call clinic at any time with any questions, concerns, or complaints.    No matching staging information was found for the patient.  Forest Gleason, MD   07/29/2015 8:19 AM

## 2015-08-01 ENCOUNTER — Ambulatory Visit: Payer: BLUE CROSS/BLUE SHIELD | Admitting: Family Medicine

## 2015-08-03 ENCOUNTER — Other Ambulatory Visit: Payer: Self-pay | Admitting: Family Medicine

## 2015-08-03 ENCOUNTER — Ambulatory Visit (INDEPENDENT_AMBULATORY_CARE_PROVIDER_SITE_OTHER): Payer: BLUE CROSS/BLUE SHIELD | Admitting: Family Medicine

## 2015-08-03 ENCOUNTER — Encounter: Payer: Self-pay | Admitting: Family Medicine

## 2015-08-03 VITALS — BP 122/78 | HR 83 | Temp 97.6°F | Resp 14 | Ht 61.0 in | Wt 166.0 lb

## 2015-08-03 DIAGNOSIS — F32A Depression, unspecified: Secondary | ICD-10-CM

## 2015-08-03 DIAGNOSIS — I10 Essential (primary) hypertension: Secondary | ICD-10-CM

## 2015-08-03 DIAGNOSIS — R5382 Chronic fatigue, unspecified: Secondary | ICD-10-CM | POA: Diagnosis not present

## 2015-08-03 DIAGNOSIS — Z23 Encounter for immunization: Secondary | ICD-10-CM

## 2015-08-03 DIAGNOSIS — M797 Fibromyalgia: Secondary | ICD-10-CM

## 2015-08-03 DIAGNOSIS — G9332 Myalgic encephalomyelitis/chronic fatigue syndrome: Secondary | ICD-10-CM

## 2015-08-03 DIAGNOSIS — F418 Other specified anxiety disorders: Secondary | ICD-10-CM | POA: Diagnosis not present

## 2015-08-03 DIAGNOSIS — K2289 Other specified disease of esophagus: Secondary | ICD-10-CM

## 2015-08-03 DIAGNOSIS — S2239XS Fracture of one rib, unspecified side, sequela: Secondary | ICD-10-CM

## 2015-08-03 DIAGNOSIS — G8929 Other chronic pain: Secondary | ICD-10-CM

## 2015-08-03 DIAGNOSIS — K228 Other specified diseases of esophagus: Secondary | ICD-10-CM | POA: Diagnosis not present

## 2015-08-03 DIAGNOSIS — R0789 Other chest pain: Secondary | ICD-10-CM | POA: Diagnosis not present

## 2015-08-03 DIAGNOSIS — F419 Anxiety disorder, unspecified: Secondary | ICD-10-CM

## 2015-08-03 DIAGNOSIS — F329 Major depressive disorder, single episode, unspecified: Secondary | ICD-10-CM

## 2015-08-03 DIAGNOSIS — D8989 Other specified disorders involving the immune mechanism, not elsewhere classified: Secondary | ICD-10-CM

## 2015-08-03 DIAGNOSIS — S2239XA Fracture of one rib, unspecified side, initial encounter for closed fracture: Secondary | ICD-10-CM | POA: Insufficient documentation

## 2015-08-03 MED ORDER — BUPROPION HCL ER (XL) 150 MG PO TB24: 150.0000 mg | ORAL_TABLET | Freq: Every day | ORAL | Status: DC

## 2015-08-03 MED ORDER — TRAMADOL HCL 50 MG PO TABS: 50.0000 mg | ORAL_TABLET | Freq: Four times a day (QID) | ORAL | Status: DC | PRN

## 2015-08-03 MED ORDER — PAROXETINE HCL 40 MG PO TABS: 40.0000 mg | ORAL_TABLET | ORAL | Status: DC

## 2015-08-03 MED ORDER — ALPRAZOLAM 0.5 MG PO TABS: 0.5000 mg | ORAL_TABLET | Freq: Two times a day (BID) | ORAL | Status: DC | PRN

## 2015-08-03 MED ORDER — GABAPENTIN 400 MG PO CAPS: 400.0000 mg | ORAL_CAPSULE | Freq: Three times a day (TID) | ORAL | Status: DC

## 2015-08-03 NOTE — Addendum Note (Signed)
Addended by: Docia Furl on: 08/03/2015 02:32 PM   Modules accepted: Orders

## 2015-08-03 NOTE — Progress Notes (Signed)
Name: Deanna Baker   MRN: [id number]    DOB: 01/04/1961   Date:08/03/2015       Progress Note  Subjective  Chief Complaint  Chief Complaint  Patient presents with  . Medication Refill    follow-up  . Hypertension  . Depression  . Anxiety  . Gastrophageal Reflux  . Flank Pain    normal xray, we sent to Dr. Jamal Collin he did CT which was normal and sent her to [facility] and he did bone scan which showed small fractures in ribs and stated if she was still hurting  at appointment in [place]. he would do MRI    HPI  HTN: taking medication and denies side effects, bp is at goal  FMS: she continues to have daily pain, taking pain medications : duloxetine, gabapentin, and antidepressant. She has daily mental fogginess, trigger point pains. She is compliant with medication  CFIDS: still feels tired all the time, takes 3 naps daily and is able to sleep all night. Post-activity fatigue is severe. Going on for year  GERD, choking and also esophageal thickenes on CT chest/abdomen. Seen by Dr. Durwin Reges in the past and we will send her back to see if he will do another EGD or upper GI series.  Chronic chest wall pain, history of fracture of ribs: seen by Dr. Jamal Collin, and Dr. Jeb Levering, had CT scan and bone scan.  She had a bone density in the past that was normal. Explained that she may still be osteoporotic, but explained that she will need to see Rheumatologist to see if she is a candidate for therapy - too many risk factors. She states she can't afford seeing any other doctors at this time.  Depression/Anxiety: still feels tired and no motivation, taking medication as prescribed. Occasionally has down days, but medication is working.   Patient Active Problem List   Diagnosis Date Noted  . Thickening of esophagus 08/03/2015  . Chronic chest wall pain 08/03/2015  . Closed rib fracture 08/03/2015  . Choking 07/04/2015  . Benign essential HTN 05/01/2015  . CFIDS (chronic fatigue and immune dysfunction  syndrome) 05/01/2015  . Anxiety and depression 05/01/2015  . Dyslipidemia 05/01/2015  . Fibromyalgia syndrome 05/01/2015  . Gastro-esophageal reflux disease without esophagitis 05/01/2015  . Blood glucose elevated 05/01/2015  . Eczema intertrigo 05/01/2015  . Chronic migraine without aura without status migrainosus, not intractable 05/01/2015  . Excessive urination at night 05/01/2015  . Dysmetabolic syndrome 79/12/4095  . History of colonoscopy with polypectomy 05/01/2015  . Central sleep apnea 05/01/2015  . History of back surgery 05/01/2015  . Thickened nails 05/01/2015  . Malignant neoplasm of upper-outer quadrant of female breast, triple negative 06/13/2013    Past Surgical History  Procedure Laterality Date  . Upper gi endoscopy      Dr. Suzette Battiest  . Shoulder surgery Left 2009  . Colonoscopy  2013    Dr. Suzette Battiest   . Tubal ligation  1982  . Tonsillectomy  1965  . Breast lumpectomy Left 12/06/12     lumpectomy with SN biopsy and power port placement...    Family History  Problem Relation Age of Onset  . Breast cancer Mother     Social History   Social History  . Marital Status: Married    Spouse Name: N/A  . Number of Children: N/A  . Years of Education: N/A   Occupational History  . Not on file.   Social History Main Topics  . Smoking status: Former Smoker --  1.00 packs/day for 20 years    Types: Cigarettes    Quit date: 11/24/1980  . Smokeless tobacco: Never Used  . Alcohol Use: 0.0 oz/week    0 Standard drinks or equivalent per week     Comment: rarely  . Drug Use: No  . Sexual Activity:    Partners: Male   Other Topics Concern  . Not on file   Social History Narrative     Current outpatient prescriptions:  .  ALPRAZolam (XANAX) 0.5 MG tablet, TAKE 1 TABLET BY MOUTH AS NEEDED FOR ANXIETY OR SLEEP, Disp: 30 tablet, Rfl: 1 .  amLODipine-benazepril (LOTREL) 5-20 MG per capsule, Take 1 capsule by mouth daily., Disp: , Rfl:  .  buPROPion  (WELLBUTRIN XL) 150 MG 24 hr tablet, Take 1 tablet (150 mg total) by mouth daily., Disp: 30 tablet, Rfl: 5 .  cyclobenzaprine (FLEXERIL) 5 MG tablet, Take 1 tablet (5 mg total) by mouth 3 (three) times daily as needed for muscle spasms., Disp: 30 tablet, Rfl: 0 .  gabapentin (NEURONTIN) 400 MG capsule, Take 1 capsule (400 mg total) by mouth 3 (three) times daily., Disp: 90 capsule, Rfl: 5 .  HYDROcodone-acetaminophen (NORCO) 10-325 MG per tablet, Take 1 tablet by mouth 3 (three) times daily as needed., Disp: , Rfl:  .  meloxicam (MOBIC) 15 MG tablet, Take 15 mg by mouth as needed. , Disp: , Rfl:  .  nitroGLYCERIN (NITROSTAT) 0.4 MG SL tablet, Place 1 tablet under the tongue as needed., Disp: , Rfl:  .  omeprazole (PRILOSEC) 40 MG capsule, Take 1 capsule (40 mg total) by mouth daily., Disp: 30 capsule, Rfl: 5 .  ondansetron (ZOFRAN) 4 MG tablet, Take 1 tablet (4 mg total) by mouth every 8 (eight) hours as needed for nausea or vomiting., Disp: 20 tablet, Rfl: 0 .  PARoxetine (PAXIL) 40 MG tablet, Take 1 tablet (40 mg total) by mouth every morning., Disp: 30 tablet, Rfl: 5 .  ranitidine (ZANTAC) 150 MG tablet, Take 1 tablet (150 mg total) by mouth daily., Disp: 30 tablet, Rfl: 5 .  traMADol (ULTRAM) 50 MG tablet, Take 1 tablet (50 mg total) by mouth every 6 (six) hours as needed., Disp: 60 tablet, Rfl: 2  Allergies  Allergen Reactions  . Pregabalin     weight gain and blurred vision  . Venlafaxine     foggy minded     ROS  Constitutional: Negative for fever or weight change.  Respiratory: Negative for cough and shortness of breath.   Cardiovascular: Negative for chest pain or palpitations.  Gastrointestinal: Negative for abdominal pain, no bowel changes.  Musculoskeletal: Negative for gait problem or joint swelling.  Skin: Negative for rash.  Neurological: Negative for dizziness or headache.  No other specific complaints in a complete review of systems (except as listed in HPI  above).  Objective  Filed Vitals:   08/03/15 1351  BP: 122/78  Pulse: 83  Temp: 97.6 F (36.4 C)  TempSrc: Oral  Resp: 14  Height: 5\' 1"  (1.549 m)  Weight: 166 lb (75.297 kg)  SpO2: 97%    Body mass index is 31.38 kg/(m^2).  Physical Exam  Constitutional: Patient appears well-developed and well-nourished. ObeseNo distress.  HEENT: head atraumatic, normocephalic, pupils equal and reactive to light,  neck supple, throat within normal limits Cardiovascular: Normal rate, regular rhythm and normal heart sounds.  No murmur heard. No BLE edema. Chest wall tenderness on both sides/over rib cages Pulmonary/Chest: Effort normal and breath sounds normal. No  respiratory distress. Abdominal: Soft.  There is no tenderness. Psychiatric: Patient has a normal mood and affect. behavior is normal. Judgment and thought content normal. Muscular Skeletal: trigger point positives   PHQ2/9: Depression screen PHQ 2/9 05/01/2015  Decreased Interest 1  Down, Depressed, Hopeless 1  PHQ - 2 Score 2  Altered sleeping 0  Tired, decreased energy 2  Change in appetite 1  Feeling bad or failure about yourself  1  Trouble concentrating 3  Moving slowly or fidgety/restless 2  Suicidal thoughts 1  PHQ-9 Score 12  Difficult doing work/chores Very difficult   She does not want to change medication or see a psychiatrist at this time  Fall Risk: Fall Risk  05/01/2015  Falls in the past year? No     Assessment & Plan  1. Fibromyalgia syndrome Continue medication  - Ambulatory referral to Gastroenterology - traMADol (ULTRAM) 50 MG tablet; Take 1 tablet (50 mg total) by mouth every 6 (six) hours as needed.  Dispense: 60 tablet; Refill: 2 - gabapentin (NEURONTIN) 400 MG capsule; Take 1 capsule (400 mg total) by mouth 3 (three) times daily.  Dispense: 90 capsule; Refill: 5  2. Thickening of esophagus Refer to Dr. Durwin Reges   3. Chronic chest wall pain Continue follow up with Dr. Jeb Levering  4. Closed rib  fracture, unspecified laterality, sequela Discussed possible osteoporosis but will need to see Rheumatologist before starting therapy because of her risk factors  5. CFIDS (chronic fatigue and immune dysfunction syndrome) stable  6. Anxiety and depression Does not want to see psychiatrist or change medication  - PARoxetine (PAXIL) 40 MG tablet; Take 1 tablet (40 mg total) by mouth every morning.  Dispense: 30 tablet; Refill: 5 - buPROPion (WELLBUTRIN XL) 150 MG 24 hr tablet; Take 1 tablet (150 mg total) by mouth daily.  Dispense: 30 tablet; Refill: 5  7. Benign essential HTN Well controlled

## 2015-08-14 ENCOUNTER — Telehealth: Payer: Self-pay | Admitting: *Deleted

## 2015-08-14 DIAGNOSIS — R948 Abnormal results of function studies of other organs and systems: Secondary | ICD-10-CM

## 2015-08-14 DIAGNOSIS — C50919 Malignant neoplasm of unspecified site of unspecified female breast: Secondary | ICD-10-CM

## 2015-08-14 DIAGNOSIS — R0781 Pleurodynia: Secondary | ICD-10-CM

## 2015-08-14 NOTE — Telephone Encounter (Signed)
Order for PET entered and message sent to schedulers. Patient informed that se will be called with appt date and time.

## 2015-08-14 NOTE — Telephone Encounter (Signed)
Reports that pain has gotten worse since last being seen on 8/31. Asking if she has to wait until the 29 th to be reevaluated. Asking if the PET can go ahead and be ordered now instead of waiting

## 2015-08-15 ENCOUNTER — Other Ambulatory Visit: Payer: Self-pay | Admitting: Family Medicine

## 2015-08-15 NOTE — Telephone Encounter (Signed)
Patient requesting refill. 

## 2015-08-16 ENCOUNTER — Other Ambulatory Visit: Payer: Self-pay | Admitting: Family Medicine

## 2015-08-16 ENCOUNTER — Ambulatory Visit
Admission: RE | Admit: 2015-08-16 | Discharge: 2015-08-16 | Disposition: A | Discharge status: Home / Self Care | Payer: BLUE CROSS/BLUE SHIELD | Source: Ambulatory Visit | Attending: Oncology | Admitting: Oncology

## 2015-08-16 DIAGNOSIS — R0781 Pleurodynia: Secondary | ICD-10-CM | POA: Insufficient documentation

## 2015-08-16 DIAGNOSIS — C50919 Malignant neoplasm of unspecified site of unspecified female breast: Secondary | ICD-10-CM | POA: Diagnosis not present

## 2015-08-16 DIAGNOSIS — R948 Abnormal results of function studies of other organs and systems: Secondary | ICD-10-CM | POA: Insufficient documentation

## 2015-08-16 DIAGNOSIS — F329 Major depressive disorder, single episode, unspecified: Secondary | ICD-10-CM

## 2015-08-16 DIAGNOSIS — F419 Anxiety disorder, unspecified: Principal | ICD-10-CM

## 2015-08-16 DIAGNOSIS — F32A Depression, unspecified: Secondary | ICD-10-CM

## 2015-08-16 LAB — GLUCOSE, CAPILLARY: Glucose-Capillary: 124 mg/dL — ABNORMAL HIGH (ref 65–99)

## 2015-08-16 MED ORDER — FLUDEOXYGLUCOSE F - 18 (FDG) INJECTION
12.0000 | Freq: Once | INTRAVENOUS | Status: DC | PRN
  Administered 2015-08-16: 12.49 via INTRAVENOUS
  Filled 2015-08-16: qty 12

## 2015-08-16 NOTE — Telephone Encounter (Signed)
Patient is telling pharmacy she is taking the 300 mg since March 2016 and would like to stay on this dose.

## 2015-08-16 NOTE — Telephone Encounter (Signed)
Patient requesting refill. 

## 2015-08-23 ENCOUNTER — Inpatient Hospital Stay: Payer: BLUE CROSS/BLUE SHIELD | Admitting: Oncology

## 2015-08-23 ENCOUNTER — Inpatient Hospital Stay: Payer: BLUE CROSS/BLUE SHIELD

## 2015-09-05 ENCOUNTER — Encounter: Payer: Self-pay | Admitting: Gastroenterology

## 2015-09-06 ENCOUNTER — Encounter: Payer: Self-pay | Admitting: Radiology

## 2015-09-06 ENCOUNTER — Inpatient Hospital Stay
Admission: EM | Admit: 2015-09-06 | Discharge: 2015-09-07 | DRG: 918 | Disposition: A | Discharge status: Home / Self Care | Payer: BLUE CROSS/BLUE SHIELD | Attending: Internal Medicine | Admitting: Internal Medicine

## 2015-09-06 ENCOUNTER — Emergency Department: Payer: BLUE CROSS/BLUE SHIELD

## 2015-09-06 ENCOUNTER — Telehealth: Payer: Self-pay | Admitting: Family Medicine

## 2015-09-06 DIAGNOSIS — K529 Noninfective gastroenteritis and colitis, unspecified: Secondary | ICD-10-CM | POA: Diagnosis present

## 2015-09-06 DIAGNOSIS — G8929 Other chronic pain: Secondary | ICD-10-CM | POA: Diagnosis present

## 2015-09-06 DIAGNOSIS — K219 Gastro-esophageal reflux disease without esophagitis: Secondary | ICD-10-CM | POA: Diagnosis present

## 2015-09-06 DIAGNOSIS — K922 Gastrointestinal hemorrhage, unspecified: Secondary | ICD-10-CM

## 2015-09-06 DIAGNOSIS — F329 Major depressive disorder, single episode, unspecified: Secondary | ICD-10-CM | POA: Diagnosis present

## 2015-09-06 DIAGNOSIS — G4731 Primary central sleep apnea: Secondary | ICD-10-CM | POA: Diagnosis present

## 2015-09-06 DIAGNOSIS — R5382 Chronic fatigue, unspecified: Secondary | ICD-10-CM | POA: Diagnosis present

## 2015-09-06 DIAGNOSIS — M797 Fibromyalgia: Secondary | ICD-10-CM | POA: Diagnosis present

## 2015-09-06 DIAGNOSIS — M5441 Lumbago with sciatica, right side: Secondary | ICD-10-CM

## 2015-09-06 DIAGNOSIS — F419 Anxiety disorder, unspecified: Secondary | ICD-10-CM | POA: Diagnosis present

## 2015-09-06 DIAGNOSIS — E785 Hyperlipidemia, unspecified: Secondary | ICD-10-CM | POA: Diagnosis present

## 2015-09-06 DIAGNOSIS — I10 Essential (primary) hypertension: Secondary | ICD-10-CM | POA: Diagnosis present

## 2015-09-06 DIAGNOSIS — T6191XA Toxic effect of unspecified seafood, accidental (unintentional), initial encounter: Principal | ICD-10-CM | POA: Diagnosis present

## 2015-09-06 DIAGNOSIS — Y929 Unspecified place or not applicable: Secondary | ICD-10-CM | POA: Diagnosis not present

## 2015-09-06 DIAGNOSIS — C50419 Malignant neoplasm of upper-outer quadrant of unspecified female breast: Secondary | ICD-10-CM | POA: Diagnosis present

## 2015-09-06 DIAGNOSIS — F17210 Nicotine dependence, cigarettes, uncomplicated: Secondary | ICD-10-CM | POA: Diagnosis present

## 2015-09-06 DIAGNOSIS — Z86010 Personal history of colonic polyps: Secondary | ICD-10-CM | POA: Diagnosis not present

## 2015-09-06 DIAGNOSIS — M1990 Unspecified osteoarthritis, unspecified site: Secondary | ICD-10-CM | POA: Diagnosis present

## 2015-09-06 LAB — COMPREHENSIVE METABOLIC PANEL
ALK PHOS: 124 U/L (ref 38–126)
ALT: 18 U/L (ref 14–54)
AST: 24 U/L (ref 15–41)
Albumin: 3.5 g/dL (ref 3.5–5.0)
Anion gap: 6 (ref 5–15)
BILIRUBIN TOTAL: 0.7 mg/dL (ref 0.3–1.2)
BUN: 15 mg/dL (ref 6–20)
CALCIUM: 8.9 mg/dL (ref 8.9–10.3)
CO2: 28 mmol/L (ref 22–32)
CREATININE: 0.96 mg/dL (ref 0.44–1.00)
Chloride: 103 mmol/L (ref 101–111)
Glucose, Bld: 117 mg/dL — ABNORMAL HIGH (ref 65–99)
Potassium: 3.7 mmol/L (ref 3.5–5.1)
Sodium: 137 mmol/L (ref 135–145)
Total Protein: 6.3 g/dL — ABNORMAL LOW (ref 6.5–8.1)

## 2015-09-06 LAB — CBC
HCT: 35.1 % (ref 35.0–47.0)
Hemoglobin: 11.5 g/dL — ABNORMAL LOW (ref 12.0–16.0)
MCH: 28.9 pg (ref 26.0–34.0)
MCHC: 32.8 g/dL (ref 32.0–36.0)
MCV: 88.1 fL (ref 80.0–100.0)
PLATELETS: 212 10*3/uL (ref 150–440)
RBC: 3.99 MIL/uL (ref 3.80–5.20)
RDW: 15.2 % — AB (ref 11.5–14.5)
WBC: 10.4 10*3/uL (ref 3.6–11.0)

## 2015-09-06 LAB — TYPE AND SCREEN
ABO/RH(D): A POS
Antibody Screen: NEGATIVE

## 2015-09-06 LAB — ABO/RH: ABO/RH(D): A POS

## 2015-09-06 MED ORDER — CIPROFLOXACIN IN D5W 400 MG/200ML IV SOLN
400.0000 mg | Freq: Two times a day (BID) | INTRAVENOUS | Status: DC
  Filled 2015-09-06: qty 200

## 2015-09-06 MED ORDER — AMLODIPINE BESY-BENAZEPRIL HCL 5-20 MG PO CAPS: 1.0000 | ORAL_CAPSULE | Freq: Every day | ORAL | Status: DC

## 2015-09-06 MED ORDER — METRONIDAZOLE IN NACL 5-0.79 MG/ML-% IV SOLN
500.0000 mg | Freq: Three times a day (TID) | INTRAVENOUS | Status: DC
  Administered 2015-09-07: 500 mg via INTRAVENOUS
  Filled 2015-09-06 (×2): qty 100

## 2015-09-06 MED ORDER — ONDANSETRON HCL 4 MG/2ML IJ SOLN
4.0000 mg | Freq: Four times a day (QID) | INTRAMUSCULAR | Status: DC | PRN
  Administered 2015-09-06: 4 mg via INTRAVENOUS
  Filled 2015-09-06: qty 2

## 2015-09-06 MED ORDER — IOHEXOL 240 MG/ML SOLN
25.0000 mL | Freq: Once | INTRAMUSCULAR | Status: AC | PRN
  Administered 2015-09-06: 25 mL via ORAL
  Filled 2015-09-06: qty 25

## 2015-09-06 MED ORDER — CYCLOBENZAPRINE HCL 10 MG PO TABS: 5.0000 mg | ORAL_TABLET | Freq: Three times a day (TID) | ORAL | Status: DC | PRN

## 2015-09-06 MED ORDER — GABAPENTIN 400 MG PO CAPS
400.0000 mg | ORAL_CAPSULE | Freq: Three times a day (TID) | ORAL | Status: DC
  Administered 2015-09-06 – 2015-09-07 (×2): 400 mg via ORAL
  Filled 2015-09-06 (×2): qty 1

## 2015-09-06 MED ORDER — AMLODIPINE BESYLATE 5 MG PO TABS
5.0000 mg | ORAL_TABLET | Freq: Every day | ORAL | Status: DC
  Administered 2015-09-06 – 2015-09-07 (×2): 5 mg via ORAL
  Filled 2015-09-06 (×2): qty 1

## 2015-09-06 MED ORDER — ACETAMINOPHEN 650 MG RE SUPP: 650.0000 mg | Freq: Four times a day (QID) | RECTAL | Status: DC | PRN

## 2015-09-06 MED ORDER — ALPRAZOLAM 0.5 MG PO TABS
0.5000 mg | ORAL_TABLET | Freq: Two times a day (BID) | ORAL | Status: DC | PRN
  Administered 2015-09-06: 0.5 mg via ORAL
  Filled 2015-09-06: qty 1

## 2015-09-06 MED ORDER — HYDROMORPHONE HCL 1 MG/ML IJ SOLN
1.0000 mg | Freq: Once | INTRAMUSCULAR | Status: AC
  Administered 2015-09-06: 1 mg via INTRAVENOUS
  Filled 2015-09-06: qty 1

## 2015-09-06 MED ORDER — IOHEXOL 300 MG/ML  SOLN
100.0000 mL | Freq: Once | INTRAMUSCULAR | Status: AC | PRN
  Administered 2015-09-06: 100 mL via INTRAVENOUS

## 2015-09-06 MED ORDER — ONDANSETRON HCL 4 MG PO TABS: 4.0000 mg | ORAL_TABLET | Freq: Four times a day (QID) | ORAL | Status: DC | PRN

## 2015-09-06 MED ORDER — CIPROFLOXACIN IN D5W 400 MG/200ML IV SOLN
400.0000 mg | Freq: Once | INTRAVENOUS | Status: DC
  Filled 2015-09-06 (×2): qty 200

## 2015-09-06 MED ORDER — ALUM & MAG HYDROXIDE-SIMETH 200-200-20 MG/5ML PO SUSP: 30.0000 mL | Freq: Four times a day (QID) | ORAL | Status: DC | PRN

## 2015-09-06 MED ORDER — ONDANSETRON HCL 4 MG PO TABS: 4.0000 mg | ORAL_TABLET | Freq: Three times a day (TID) | ORAL | Status: DC | PRN

## 2015-09-06 MED ORDER — HYDROCODONE-ACETAMINOPHEN 5-325 MG PO TABS
1.0000 | ORAL_TABLET | ORAL | Status: DC | PRN
  Administered 2015-09-06 – 2015-09-07 (×2): 2 via ORAL
  Filled 2015-09-06 (×2): qty 2

## 2015-09-06 MED ORDER — PANTOPRAZOLE SODIUM 40 MG PO TBEC
40.0000 mg | DELAYED_RELEASE_TABLET | Freq: Every day | ORAL | Status: DC
  Administered 2015-09-07: 40 mg via ORAL
  Filled 2015-09-06: qty 1

## 2015-09-06 MED ORDER — METRONIDAZOLE IN NACL 5-0.79 MG/ML-% IV SOLN
500.0000 mg | Freq: Once | INTRAVENOUS | Status: AC
  Administered 2015-09-06: 500 mg via INTRAVENOUS
  Filled 2015-09-06 (×2): qty 100

## 2015-09-06 MED ORDER — SODIUM CHLORIDE 0.9 % IV SOLN
INTRAVENOUS | Status: DC
  Administered 2015-09-07: 02:00:00 via INTRAVENOUS

## 2015-09-06 MED ORDER — BUPROPION HCL ER (XL) 150 MG PO TB24: 300.0000 mg | ORAL_TABLET | Freq: Every day | ORAL | Status: DC

## 2015-09-06 MED ORDER — PAROXETINE HCL 10 MG PO TABS
40.0000 mg | ORAL_TABLET | ORAL | Status: DC
  Administered 2015-09-07: 40 mg via ORAL
  Filled 2015-09-06: qty 4

## 2015-09-06 MED ORDER — NICOTINE 21 MG/24HR TD PT24
21.0000 mg | MEDICATED_PATCH | Freq: Every day | TRANSDERMAL | Status: DC
  Filled 2015-09-06: qty 1

## 2015-09-06 MED ORDER — ONDANSETRON HCL 4 MG/2ML IJ SOLN
4.0000 mg | Freq: Once | INTRAMUSCULAR | Status: AC
  Administered 2015-09-06: 4 mg via INTRAVENOUS

## 2015-09-06 MED ORDER — ACETAMINOPHEN 325 MG PO TABS
650.0000 mg | ORAL_TABLET | Freq: Four times a day (QID) | ORAL | Status: DC | PRN
  Administered 2015-09-07: 650 mg via ORAL
  Filled 2015-09-06: qty 2

## 2015-09-06 MED ORDER — SODIUM CHLORIDE 0.9 % IV BOLUS (SEPSIS)
1000.0000 mL | Freq: Once | INTRAVENOUS | Status: AC
  Administered 2015-09-06: 1000 mL via INTRAVENOUS

## 2015-09-06 MED ORDER — ONDANSETRON HCL 4 MG/2ML IJ SOLN
INTRAMUSCULAR | Status: AC
  Administered 2015-09-06: 4 mg via INTRAVENOUS
  Filled 2015-09-06: qty 2

## 2015-09-06 MED ORDER — BENAZEPRIL HCL 20 MG PO TABS
20.0000 mg | ORAL_TABLET | Freq: Every day | ORAL | Status: DC
  Administered 2015-09-06 – 2015-09-07 (×2): 20 mg via ORAL
  Filled 2015-09-06 (×2): qty 1

## 2015-09-06 MED ORDER — SENNOSIDES-DOCUSATE SODIUM 8.6-50 MG PO TABS: 1.0000 | ORAL_TABLET | Freq: Every evening | ORAL | Status: DC | PRN

## 2015-09-06 MED ORDER — BUPROPION HCL ER (XL) 150 MG PO TB24
300.0000 mg | ORAL_TABLET | Freq: Every day | ORAL | Status: DC
  Administered 2015-09-06 – 2015-09-07 (×2): 300 mg via ORAL
  Filled 2015-09-06 (×2): qty 2

## 2015-09-06 NOTE — Telephone Encounter (Signed)
Since I will not be here this pm, she should go to Urgent Care for further evaluation.

## 2015-09-06 NOTE — Telephone Encounter (Signed)
PT SAID THAT LAST NIGHT SHE HAD FLOUNDER AND SHRIMP . ABOUT AN HR LATER HER STOMACH STARTING HURTING AND STARTING THROWING UP. SHE IS HAVING COLD SWEATS AND NOW SHE IS HAVING DIARRHEA AND WENT TO THE BATHROOM AND IT WAS BLOOD ONLY. HER LIPS ALSO SWOLLEN LAST NIGHT A COUPLE HRS AFTER SHE ATE.

## 2015-09-06 NOTE — Telephone Encounter (Signed)
Pt informed

## 2015-09-06 NOTE — ED Notes (Signed)
PT here for rectal bleeding. Reports bright red blood during bowel movement. Has had 6 episodes today.

## 2015-09-06 NOTE — ED Provider Notes (Signed)
[facility] Emergency Department Provider Note  ____________________________________________  Time seen: Approximately 4:33 PM  I have reviewed the triage vital signs and the nursing notes.   HISTORY  Chief Complaint Rectal Bleeding    HPI Deanna Baker is a 54 y.o. female , not anticoagulated, presenting with rectal bleeding, lower abdominal pain. Patient reports that yesterday she had multiple episodes of nonbloody vomiting and loose, watery stools. This morning she developed lower abdominal cramping and has had multiple episodes of bright red blood per rectum with several clots. She denies any fever, known hemorrhoids, chills, or other sick contacts.   Past Medical History  Diagnosis Date  . Fibromyalgia 1993  . Arthritis 1990  . Hypertension 2012  . Heart murmur   . Measles 1960  . Lump or mass in breast   . Mumps 1970  . Chronic fatigue syndrome   . Colon polyp 2013  . Malignant neoplasm of upper-outer quadrant of female breast ([place])     Invasive mammary carcinoma, triple negative    Patient Active Problem List   Diagnosis Date Noted  . Thickening of esophagus 08/03/2015  . Chronic chest wall pain 08/03/2015  . Closed rib fracture 08/03/2015  . Choking 07/04/2015  . Benign essential HTN 05/01/2015  . CFIDS (chronic fatigue and immune dysfunction syndrome) 05/01/2015  . Anxiety and depression 05/01/2015  . Dyslipidemia 05/01/2015  . Fibromyalgia syndrome 05/01/2015  . Gastro-esophageal reflux disease without esophagitis 05/01/2015  . Blood glucose elevated 05/01/2015  . Eczema intertrigo 05/01/2015  . Chronic migraine without aura without status migrainosus, not intractable 05/01/2015  . Excessive urination at night 05/01/2015  . Dysmetabolic syndrome 09/98/3382  . History of colonoscopy with polypectomy 05/01/2015  . Central sleep apnea 05/01/2015  . History of back surgery 05/01/2015  . Thickened nails 05/01/2015  . Malignant  neoplasm of upper-outer quadrant of female breast, triple negative 06/13/2013    Past Surgical History  Procedure Laterality Date  . Upper gi endoscopy      Dr. Suzette Battiest  . Shoulder surgery Left 2009  . Colonoscopy  2013    Dr. Suzette Battiest   . Tubal ligation  1982  . Tonsillectomy  1965  . Breast lumpectomy Left 12/06/12     lumpectomy with SN biopsy and power port placement...    Current Outpatient Rx  Name  Route  Sig  Dispense  Refill  . ALPRAZolam (XANAX) 0.5 MG tablet   Oral   Take 1 tablet (0.5 mg total) by mouth 2 (two) times daily as needed for anxiety.   30 tablet   1     Not to exceed 4 additional fills before 07/28/2015   . amLODipine-benazepril (LOTREL) 5-20 MG per capsule   Oral   Take 1 capsule by mouth daily.         Marland Kitchen buPROPion (WELLBUTRIN XL) 300 MG 24 hr tablet      TAKE 1 TABLET EVERY DAY   30 tablet   6     RX WAS SENT IN FOR 150MG . PT NEEDS RX FOR 300MG  .   Marland Kitchen buPROPion (WELLBUTRIN XL) 300 MG 24 hr tablet   Oral   Take 300 mg by mouth daily.         . cyclobenzaprine (FLEXERIL) 5 MG tablet   Oral   Take 1 tablet (5 mg total) by mouth 3 (three) times daily as needed for muscle spasms.   30 tablet   0   . gabapentin (NEURONTIN) 400 MG capsule  Oral   Take 1 capsule (400 mg total) by mouth 3 (three) times daily.   90 capsule   5   . HYDROcodone-acetaminophen (NORCO) 10-325 MG per tablet   Oral   Take 1 tablet by mouth 3 (three) times daily as needed.         . meloxicam (MOBIC) 15 MG tablet   Oral   Take 15 mg by mouth as needed.          . nitroGLYCERIN (NITROSTAT) 0.4 MG SL tablet   Sublingual   Place 1 tablet under the tongue as needed.         Marland Kitchen omeprazole (PRILOSEC) 40 MG capsule   Oral   Take 1 capsule (40 mg total) by mouth daily.   30 capsule   5   . ondansetron (ZOFRAN) 4 MG tablet   Oral   Take 1 tablet (4 mg total) by mouth every 8 (eight) hours as needed for nausea or vomiting.   20 tablet   0   .  PARoxetine (PAXIL) 40 MG tablet   Oral   Take 1 tablet (40 mg total) by mouth every morning.   30 tablet   5   . ranitidine (ZANTAC) 150 MG tablet   Oral   Take 1 tablet (150 mg total) by mouth daily.   30 tablet   5   . traMADol (ULTRAM) 50 MG tablet   Oral   Take 1 tablet (50 mg total) by mouth every 6 (six) hours as needed.   60 tablet   2     Allergies Pregabalin; Shellfish allergy; and Venlafaxine  Family History  Problem Relation Age of Onset  . Breast cancer Mother     Social History Social History  Substance Use Topics  . Smoking status: Former Smoker -- 1.00 packs/day for 20 years    Types: Cigarettes    Quit date: 11/24/1980  . Smokeless tobacco: Never Used  . Alcohol Use: 0.0 oz/week    0 Standard drinks or equivalent per week     Comment: rarely    Review of Systems Constitutional: No fever/chills. No lightheadedness or syncope. Eyes: No visual changes. ENT: No sore throat. Cardiovascular: Denies chest pain, palpitations. Respiratory: Denies shortness of breath.  No cough. Gastrointestinal: Positive abdominal pain.  No nausea, no vomiting.  Positive diarrhea and bright red blood per rectum..  No constipation. Genitourinary: Negative for dysuria. Musculoskeletal: Negative for back pain. Skin: Negative for rash. Neurological: Negative for headaches, focal weakness or numbness.  10-point ROS otherwise negative.  ____________________________________________   PHYSICAL EXAM:  VITAL SIGNS: ED Triage Vitals  Enc Vitals Group     BP 09/06/15 1429 137/62 mmHg     Pulse Rate 09/06/15 1429 98     Resp 09/06/15 1429 17     Temp 09/06/15 1429 98.7 F (37.1 C)     Temp Source 09/06/15 1429 Oral     SpO2 09/06/15 1429 100 %     Weight 09/06/15 1429 167 lb (75.751 kg)     Height 09/06/15 1429 5\' 1"  (1.549 m)     Head Cir --      Peak Flow --      Pain Score --      Pain Loc --      Pain Edu? --      Excl. in [place]? --     Constitutional: Alert  and oriented. Well appearing and in no acute distress. Answers questions appropriately. Eyes: Conjunctivae are  normal.  EOMI. Head: Atraumatic. Nose: No congestion/rhinnorhea. Mouth/Throat: Mucous membranes are moist.  Neck: No stridor.  Supple.   Cardiovascular: Normal rate, regular rhythm. No murmurs, rubs or gallops.  Respiratory: Normal respiratory effort.  No retractions. Lungs CTAB.  No wheezes, rales or ronchi. Gastrointestinal: Soft and nondistended. Tender to palpation in the lower abdomen without focality. No rebound or guarding.. No peritoneal signs. Genitourinary: Patient has nonthrombosed nonbleeding hemorrhoid at 6:00. No palpable internal hemorrhoids bright red blood with mucus on rectal exam that is guaiac positive. Musculoskeletal: No LE edema.  Neurologic:  Normal speech and language. No gross focal neurologic deficits are appreciated.  Skin:  Skin is warm, dry and intact. No rash noted. Psychiatric: Mood and affect are normal. Speech and behavior are normal.  Normal judgement.  ____________________________________________   LABS (all labs ordered are listed, but only abnormal results are displayed)  Labs Reviewed  COMPREHENSIVE METABOLIC PANEL - Abnormal; Notable for the following:    Glucose, Bld 117 (*)    Total Protein 6.3 (*)    All other components within normal limits  CBC - Abnormal; Notable for the following:    Hemoglobin 11.5 (*)    RDW 15.2 (*)    All other components within normal limits  POC OCCULT BLOOD, ED  TYPE AND SCREEN  ABO/RH   ____________________________________________  EKG  Not indicated ____________________________________________  RADIOLOGY  Ct Abdomen Pelvis W Contrast  09/06/2015  CLINICAL DATA:  54 year old female with abdominal and pelvic pain, nausea vomiting and rectal bleeding for 1 day. History of breast cancer. EXAM: CT ABDOMEN AND PELVIS WITH CONTRAST TECHNIQUE: Multidetector CT imaging of the abdomen and pelvis was  performed using the standard protocol following bolus administration of intravenous contrast. CONTRAST:  118mL OMNIPAQUE IOHEXOL 300 MG/ML  SOLN COMPARISON:  08/16/2015 PET-CT and prior exams FINDINGS: Lower chest:  Minimal left basilar scarring identified. Hepatobiliary: The liver and gallbladder are unremarkable. There is no evidence biliary dilatation. Pancreas: Unremarkable Spleen: Unremarkable Adrenals/Urinary Tract: The kidneys, adrenal glands and bladder are unremarkable. Stomach/Bowel: Diffuse circumferential wall thickening of the descending and sigmoid colon noted with adjacent inflammation compatible with colitis. There is no evidence of bowel obstruction. The appendix is normal. Vascular/Lymphatic: No enlarged lymph nodes. The vascular structures are unremarkable without evidence of aneurysm. The visualized celiac artery, SMA, IMA and SMV are patent. Reproductive: The uterus and adnexal regions are within normal limits. Other: A trace amount of pelvic fluid is noted. There is no evidence of abscess or pneumoperitoneum. Musculoskeletal: No acute or suspicious abnormalities identified. Remote left-sided rib fractures are identified. IMPRESSION: Descending and sigmoid colitis -likely inflammatory or infectious. No evidence of pneumoperitoneum, abscess or bowel obstruction. Electronically Signed   By: Margarette Canada M.D.   On: 09/06/2015 17:40    ____________________________________________   PROCEDURES  Procedure(s) performed: None  Critical Care performed: No ____________________________________________   INITIAL IMPRESSION / ASSESSMENT AND PLAN / ED COURSE  Pertinent labs & imaging results that were available during my care of the patient were reviewed by me and considered in my medical decision making (see chart for details).  54 y.o. female with 24 hours of nausea vomiting and diarrhea, now with bright red blood per rectum. It is possible that the patient has a viral or bacterial GI  illness that is causing bleeding. She may also have diverticulitis. Malignancy is also possible. At this time, the patient has stable vital signs, reassuring nontoxic exam, and stable hematocrit. I will plan a CT scan of  the abdomen and anticipated admission for GI bleed.  ----------------------------------------- 6:13 PM on 09/06/2015 -----------------------------------------  The patient continues to remain stable at this time.  Her CT scan does show some colitis which is likely the cause of her GI bleeding. We will continue to monitor her bleeding, and treat her with antibiotics for the colitis. Plan admission.  ____________________________________________  FINAL CLINICAL IMPRESSION(S) / ED DIAGNOSES  Final diagnoses:  Colitis, acute  Gastrointestinal hemorrhage, unspecified gastritis, unspecified gastrointestinal hemorrhage type      NEW MEDICATIONS STARTED DURING THIS VISIT:  New Prescriptions   No medications on file     Eula Listen, MD 09/06/15 1813

## 2015-09-06 NOTE — H&P (Signed)
[facility] [place] NAME: Deanna Baker    MR#:  [id number]  DATE OF BIRTH:  [birth year]  DATE OF ADMISSION:  09/06/2015  PRIMARY CARE PHYSICIAN: Loistine Chance, MD   REQUESTING/REFERRING PHYSICIAN: Dr. Mariea Clonts  CHIEF COMPLAINT:  Rectal bleeding HISTORY OF PRESENT ILLNESS:  Deanna Baker  is a 54 y.o. female with a known history of fibromyalgia and essential hypertension who presents with F lower abdominal pain along with rectal bleeding. Patient reports that yesterday she went to eat fish and shrimp at a [employer]. 90 minutes after completing her meal she developed profuse vomiting. She is also complaining of loose and watery stools. This morning she developed lower abdominal cramping and had multiple episodes of bright red blood per rectum with several clots. Patient denies fever or chills. Patient reports she has had a colonoscopy in the past week did not show evidence of inflammatory bowel disease. She has had polyps removed which she reports were not cancerous. She has a follow-up appointment with Dr. Allen Norris for an endoscopy in 2 weeks. She is started on Cipro and Flagyl in the emergency department. CT scan showed sigmoid and descending colitis  PAST MEDICAL HISTORY:   Past Medical History  Diagnosis Date  . Fibromyalgia 1993  . Arthritis 1990  . Hypertension 2012  . Heart murmur   . Measles 1960  . Lump or mass in breast   . Mumps 1970  . Chronic fatigue syndrome   . Colon polyp 2013  . Malignant neoplasm of upper-outer quadrant of female breast ([place])     Invasive mammary carcinoma, triple negative    PAST SURGICAL HISTORY:   Past Surgical History  Procedure Laterality Date  . Upper gi endoscopy      Dr. Suzette Battiest  . Shoulder surgery Left 2009  . Colonoscopy  2013    Dr. Suzette Battiest   . Tubal ligation  1982  . Tonsillectomy  1965  . Breast lumpectomy Left 12/06/12     lumpectomy with SN biopsy and power port  placement...    SOCIAL HISTORY:   Social History  Substance Use Topics  . Smoking status: Former Smoker -- 1.00 packs/day for 20 years    Types: Cigarettes    Quit date: 11/24/1980  . Smokeless tobacco: Never Used  . Alcohol Use: 0.0 oz/week    0 Standard drinks or equivalent per week     Comment: rarely    FAMILY HISTORY:   Family History  Problem Relation Age of Onset  . Breast cancer Mother     DRUG ALLERGIES:   Allergies  Allergen Reactions  . Pregabalin     weight gain and blurred vision  . Shellfish Allergy Other (See Comments)    Reports possible episode of lip swelling after having shrimp.  . Venlafaxine     foggy minded     REVIEW OF SYSTEMS:  CONSTITUTIONAL: No fever, fatigue or weakness.  EYES: No blurred or double vision.  EARS, NOSE, AND THROAT: No tinnitus or ear pain.  RESPIRATORY: No cough, shortness of breath, wheezing or hemoptysis.  CARDIOVASCULAR: No chest pain, orthopnea, edema.  GASTROINTESTINAL: Positive left lower quadrant abdominal pain with bloody stool and nausea and vomiting.Marland Kitchen  GENITOURINARY: No dysuria, hematuria.  ENDOCRINE: No polyuria, nocturia,  HEMATOLOGY: No anemia, easy bruising or bleeding SKIN: No rash or lesion. MUSCULOSKELETAL: Positive fibromyalgia NEUROLOGIC: No tingling, numbness, weakness.  PSYCHIATRY: Positive for anxiety and depression   MEDICATIONS AT HOME:  Prior to Admission medications   Medication Sig Start Date End Date Taking? Authorizing Provider  ALPRAZolam Duanne Moron) 0.5 MG tablet Take 1 tablet (0.5 mg total) by mouth 2 (two) times daily as needed for anxiety. 08/03/15   Steele Sizer, MD  amLODipine-benazepril (LOTREL) 5-20 MG per capsule Take 1 capsule by mouth daily. 12/18/13   Historical Provider, MD  buPROPion (WELLBUTRIN XL) 300 MG 24 hr tablet TAKE 1 TABLET EVERY DAY 08/16/15   Steele Sizer, MD  buPROPion (WELLBUTRIN XL) 300 MG 24 hr tablet Take 300 mg by mouth daily.    Historical Provider, MD   cyclobenzaprine (FLEXERIL) 5 MG tablet Take 1 tablet (5 mg total) by mouth 3 (three) times daily as needed for muscle spasms. 07/13/15   Evlyn Kanner, NP  gabapentin (NEURONTIN) 400 MG capsule Take 1 capsule (400 mg total) by mouth 3 (three) times daily. 08/03/15   Steele Sizer, MD  HYDROcodone-acetaminophen (NORCO) 10-325 MG per tablet Take 1 tablet by mouth 3 (three) times daily as needed.    Historical Provider, MD  meloxicam (MOBIC) 15 MG tablet Take 15 mg by mouth as needed.     Historical Provider, MD  nitroGLYCERIN (NITROSTAT) 0.4 MG SL tablet Place 1 tablet under the tongue as needed. 05/11/15 05/10/16  Historical Provider, MD  omeprazole (PRILOSEC) 40 MG capsule Take 1 capsule (40 mg total) by mouth daily. 05/01/15   Steele Sizer, MD  ondansetron (ZOFRAN) 4 MG tablet Take 1 tablet (4 mg total) by mouth every 8 (eight) hours as needed for nausea or vomiting. 05/01/15   Steele Sizer, MD  PARoxetine (PAXIL) 40 MG tablet Take 1 tablet (40 mg total) by mouth every morning. 08/03/15   Steele Sizer, MD  ranitidine (ZANTAC) 150 MG tablet Take 1 tablet (150 mg total) by mouth daily. 05/01/15   Steele Sizer, MD  traMADol (ULTRAM) 50 MG tablet Take 1 tablet (50 mg total) by mouth every 6 (six) hours as needed. 08/03/15   Steele Sizer, MD      VITAL SIGNS:  Blood pressure 132/68, pulse 78, temperature 98.7 F (37.1 C), temperature source Oral, resp. rate 20, height 5\' 1"  (1.549 m), weight 75.751 kg (167 lb), SpO2 98 %.  PHYSICAL EXAMINATION:  GENERAL:  54 y.o.-year-old patient lying in the bed with no acute distress.  EYES: Pupils equal, round, reactive to light and accommodation. No scleral icterus. Extraocular muscles intact.  HEENT: Head atraumatic, normocephalic. Oropharynx and nasopharynx clear.  NECK:  Supple, no jugular venous distention. No thyroid enlargement, no tenderness.  LUNGS: Normal breath sounds bilaterally, no wheezing, rales,rhonchi or crepitation. No use of accessory muscles  of respiration.  CARDIOVASCULAR: S1, S2 normal. No murmurs, rubs, or gallops.  ABDOMEN: Soft, nontender, nondistended. Bowel sounds present. No organomegaly or mass.  EXTREMITIES: No pedal edema, cyanosis, or clubbing.  NEUROLOGIC: Cranial nerves II through XII are grossly intact. No focal deficits. PSYCHIATRIC: The patient is alert and oriented x 3.  SKIN: No obvious rash, lesion, or ulcer.   LABORATORY PANEL:   CBC  Recent Labs Lab 09/06/15 1435  WBC 10.4  HGB 11.5*  HCT 35.1  PLT 212   ------------------------------------------------------------------------------------------------------------------  Chemistries   Recent Labs Lab 09/06/15 1435  NA 137  K 3.7  CL 103  CO2 28  GLUCOSE 117*  BUN 15  CREATININE 0.96  CALCIUM 8.9  AST 24  ALT 18  ALKPHOS 124  BILITOT 0.7   ------------------------------------------------------------------------------------------------------------------  Cardiac Enzymes No results for input(s): TROPONINI in the  last 168 hours. ------------------------------------------------------------------------------------------------------------------  RADIOLOGY:  Ct Abdomen Pelvis W Contrast  09/06/2015  CLINICAL DATA:  54 year old female with abdominal and pelvic pain, nausea vomiting and rectal bleeding for 1 day. History of breast cancer. EXAM: CT ABDOMEN AND PELVIS WITH CONTRAST TECHNIQUE: Multidetector CT imaging of the abdomen and pelvis was performed using the standard protocol following bolus administration of intravenous contrast. CONTRAST:  133mL OMNIPAQUE IOHEXOL 300 MG/ML  SOLN COMPARISON:  08/16/2015 PET-CT and prior exams FINDINGS: Lower chest:  Minimal left basilar scarring identified. Hepatobiliary: The liver and gallbladder are unremarkable. There is no evidence biliary dilatation. Pancreas: Unremarkable Spleen: Unremarkable Adrenals/Urinary Tract: The kidneys, adrenal glands and bladder are unremarkable. Stomach/Bowel: Diffuse  circumferential wall thickening of the descending and sigmoid colon noted with adjacent inflammation compatible with colitis. There is no evidence of bowel obstruction. The appendix is normal. Vascular/Lymphatic: No enlarged lymph nodes. The vascular structures are unremarkable without evidence of aneurysm. The visualized celiac artery, SMA, IMA and SMV are patent. Reproductive: The uterus and adnexal regions are within normal limits. Other: A trace amount of pelvic fluid is noted. There is no evidence of abscess or pneumoperitoneum. Musculoskeletal: No acute or suspicious abnormalities identified. Remote left-sided rib fractures are identified. IMPRESSION: Descending and sigmoid colitis -likely inflammatory or infectious. No evidence of pneumoperitoneum, abscess or bowel obstruction. Electronically Signed   By: Margarette Canada M.D.   On: 09/06/2015 17:40    EKG:     IMPRESSION AND PLAN:  54 year old female with a history of essential hypertension and fibromyalgia who presents with rectal bleeding and found to have descending and sigmoid colitis on CT scan.  1. Descending and sigmoid colitis: This is likely infectious in nature. I am presuming this could be secondary to food poisoning. Her symptoms started approximately 90 minutes after she ate shrimp and fish. I will continue ciprofloxacin and Flagyl. I have ordered stool culture. At this time patient does not need a GI consultation, however if she has profuse rectal bleeding or pain then GI consultation should be placed. She does have a follow-up appointment with Dr.Wohl in 2 weeks.  2. Rectal bleeding: This is likely secondary to colitis. She has had colonoscopies in the past which did not show evidence of inflammatory bowel disease as per the patient. I will continue to follow hemoglobin. 3. Fibromyalgia: Continue outpatient medications including gabapentin.  4. Essential hypertension: Continue Lotrel  5. Depression/anxiety: Continue Wellbutrin,  Paxil and Xanax  6.  tobacco dependence: Patient is encouraged to stop smoking. Patient was counseled for 3 minutes. Nicotine patch has been ordered.  All the records are reviewed and case discussed with ED provider. Management plans discussed with the patient and she is in agreement.  CODE STATUS: Full  TOTAL TIME TAKING CARE OF THIS PATIENT: 45 minutes.    Abigail Teall M.D on 09/06/2015 at 6:47 PM  Between 7am to 6pm - Pager - [phone] After 6pm go to [email or web address] - password [email or web address] [facility]  Office  [phone]  CC: Primary care physician; Loistine Chance, MD

## 2015-09-06 NOTE — ED Notes (Signed)
MD at bedside. 

## 2015-09-07 ENCOUNTER — Telehealth: Payer: Self-pay | Admitting: Gastroenterology

## 2015-09-07 LAB — CBC
HCT: 33.3 % — ABNORMAL LOW (ref 35.0–47.0)
Hemoglobin: 11.1 g/dL — ABNORMAL LOW (ref 12.0–16.0)
MCH: 29.9 pg (ref 26.0–34.0)
MCHC: 33.4 g/dL (ref 32.0–36.0)
MCV: 89.2 fL (ref 80.0–100.0)
PLATELETS: 168 10*3/uL (ref 150–440)
RBC: 3.73 MIL/uL — ABNORMAL LOW (ref 3.80–5.20)
RDW: 15.1 % — AB (ref 11.5–14.5)
WBC: 10.1 10*3/uL (ref 3.6–11.0)

## 2015-09-07 LAB — BASIC METABOLIC PANEL
Anion gap: 5 (ref 5–15)
BUN: 8 mg/dL (ref 6–20)
CALCIUM: 8.6 mg/dL — AB (ref 8.9–10.3)
CO2: 27 mmol/L (ref 22–32)
CREATININE: 0.68 mg/dL (ref 0.44–1.00)
Chloride: 109 mmol/L (ref 101–111)
GFR calc non Af Amer: >60 mL/min (ref >60)
Glucose, Bld: 125 mg/dL — ABNORMAL HIGH (ref 65–99)
Potassium: 3.5 mmol/L (ref 3.5–5.1)
SODIUM: 141 mmol/L (ref 135–145)

## 2015-09-07 LAB — HEMOGLOBIN: HEMOGLOBIN: 10.2 g/dL — AB (ref 12.0–16.0)

## 2015-09-07 MED ORDER — METRONIDAZOLE 500 MG PO TABS: 500.0000 mg | ORAL_TABLET | Freq: Three times a day (TID) | ORAL | Status: DC

## 2015-09-07 MED ORDER — CIPROFLOXACIN HCL 500 MG PO TABS
500.0000 mg | ORAL_TABLET | Freq: Two times a day (BID) | ORAL | Status: DC
  Administered 2015-09-07: 500 mg via ORAL
  Filled 2015-09-07: qty 1

## 2015-09-07 MED ORDER — METRONIDAZOLE 500 MG PO TABS
500.0000 mg | ORAL_TABLET | Freq: Three times a day (TID) | ORAL | Status: DC
  Administered 2015-09-07 (×2): 500 mg via ORAL
  Filled 2015-09-07 (×2): qty 1

## 2015-09-07 MED ORDER — CIPROFLOXACIN HCL 500 MG PO TABS: 500.0000 mg | ORAL_TABLET | Freq: Two times a day (BID) | ORAL | Status: DC

## 2015-09-07 MED ORDER — ONDANSETRON HCL 4 MG PO TABS: 4.0000 mg | ORAL_TABLET | Freq: Three times a day (TID) | ORAL | Status: DC | PRN

## 2015-09-07 NOTE — Progress Notes (Signed)
Pt d/c home; d/c instructions reviewed w/ pt; pt understanding was verbalized; IV removed catheter in tact, gauze dressing applied; all pt questions answered; pt left unit via wheelchair accompanied by staff 

## 2015-09-07 NOTE — Discharge Summary (Signed)
[facility] [place] NAME: Deanna Baker    MR#:  [id number]  DATE OF BIRTH:  03-26-1961  DATE OF ADMISSION:  09/06/2015 ADMITTING PHYSICIAN: Bettey Costa, MD  DATE OF DISCHARGE: 09/07/2015  PRIMARY CARE PHYSICIAN: Loistine Chance, MD    ADMISSION DIAGNOSIS:  Colitis, acute [K52.9] Gastrointestinal hemorrhage, unspecified gastritis, unspecified gastrointestinal hemorrhage type [K92.2]  DISCHARGE DIAGNOSIS:  Active Problems:   Colitis   SECONDARY DIAGNOSIS:   Past Medical History  Diagnosis Date  . Fibromyalgia 1993  . Arthritis 1990  . Hypertension 2012  . Heart murmur   . Measles 1960  . Lump or mass in breast   . Mumps 1970  . Chronic fatigue syndrome   . Colon polyp 2013  . Malignant neoplasm of upper-outer quadrant of female breast ([place])     Invasive mammary carcinoma, triple negative    HOSPITAL COURSE:   54y/o F with PMH of fibromyalgia, HTN, arthritis chronic fatigue syndrome presents to the hospital after an episode of food poisoning, with nausea vomiting diarrhea. CT of the abdomen showing colitis  #1 acute colitis-triggered by seafood ingestion. Likely food poisoning too. -Symptomatically improving. No fever or white count. -Received IV fluids. Nausea and diarrhea improved. -Advance diet, change antibiotics to oral. On Cipro and Flagyl. -If able to tolerate lunch today, We'll discharge her today   #2 fibromyalgia-continue home medications  #3 tobacco use disorder-nicotine patch  #4 rectal bleeding-likely associated with her colitis. Recent colonoscopy with no significant bowel disease. Hemoglobin is stable no further active bleeding. -Continue outpatient follow-up.  #5 depression and anxiety-Paxil, Xanax, Wellbutrin-continue all those  Possible discharge today  DISCHARGE CONDITIONS:   Stable  CONSULTS OBTAINED:    None  DRUG ALLERGIES:   Allergies  Allergen Reactions  . Pregabalin      weight gain and blurred vision  . Shellfish Allergy Other (See Comments)    Reports possible episode of lip swelling after having shrimp.  . Venlafaxine     foggy minded    DISCHARGE MEDICATIONS:   Current Discharge Medication List    START taking these medications   Details  ciprofloxacin (CIPRO) 500 MG tablet Take 1 tablet (500 mg total) by mouth 2 (two) times daily. Qty: 14 tablet, Refills: 0    metroNIDAZOLE (FLAGYL) 500 MG tablet Take 1 tablet (500 mg total) by mouth every 8 (eight) hours. Qty: 21 tablet, Refills: 0      CONTINUE these medications which have CHANGED   Details  ondansetron (ZOFRAN) 4 MG tablet Take 1 tablet (4 mg total) by mouth every 8 (eight) hours as needed for nausea or vomiting. Qty: 20 tablet, Refills: 0   Associated Diagnoses: Bilateral low back pain with right-sided sciatica      CONTINUE these medications which have NOT CHANGED   Details  ALPRAZolam (XANAX) 0.5 MG tablet Take 1 tablet (0.5 mg total) by mouth 2 (two) times daily as needed for anxiety. Qty: 30 tablet, Refills: 1    amLODipine-benazepril (LOTREL) 5-20 MG per capsule Take 1 capsule by mouth daily.    buPROPion (WELLBUTRIN XL) 300 MG 24 hr tablet TAKE 1 TABLET EVERY DAY Qty: 30 tablet, Refills: 6    gabapentin (NEURONTIN) 400 MG capsule Take 1 capsule (400 mg total) by mouth 3 (three) times daily. Qty: 90 capsule, Refills: 5   Associated Diagnoses: Fibromyalgia syndrome    HYDROcodone-acetaminophen (NORCO) 10-325 MG per tablet Take 1 tablet by mouth 3 (three) times daily  as needed.    meloxicam (MOBIC) 15 MG tablet Take 15 mg by mouth as needed.     nitroGLYCERIN (NITROSTAT) 0.4 MG SL tablet Place 1 tablet under the tongue as needed.    omeprazole (PRILOSEC) 40 MG capsule Take 1 capsule (40 mg total) by mouth daily. Qty: 30 capsule, Refills: 5   Associated Diagnoses: Gastro-esophageal reflux disease without esophagitis    PARoxetine (PAXIL) 40 MG tablet Take 1 tablet (40 mg  total) by mouth every morning. Qty: 30 tablet, Refills: 5   Associated Diagnoses: Anxiety and depression    ranitidine (ZANTAC) 150 MG tablet Take 1 tablet (150 mg total) by mouth daily. Qty: 30 tablet, Refills: 5   Associated Diagnoses: Gastro-esophageal reflux disease without esophagitis    traMADol (ULTRAM) 50 MG tablet Take 1 tablet (50 mg total) by mouth every 6 (six) hours as needed. Qty: 60 tablet, Refills: 2   Associated Diagnoses: Fibromyalgia syndrome      STOP taking these medications     cyclobenzaprine (FLEXERIL) 5 MG tablet          DISCHARGE INSTRUCTIONS:   1. PCP f/u in 2 weeks 2. GI f/u in 3 weeks  If you experience worsening of your admission symptoms, develop shortness of breath, life threatening emergency, suicidal or homicidal thoughts you must seek medical attention immediately by calling 911 or calling your MD immediately  if symptoms less severe.  You Must read complete instructions/literature along with all the possible adverse reactions/side effects for all the Medicines you take and that have been prescribed to you. Take any new Medicines after you have completely understood and accept all the possible adverse reactions/side effects.   Please note  You were cared for by a hospitalist during your hospital stay. If you have any questions about your discharge medications or the care you received while you were in the hospital after you are discharged, you can call the unit and asked to speak with the hospitalist on call if the hospitalist that took care of you is not available. Once you are discharged, your primary care physician will handle any further medical issues. Please note that NO REFILLS for any discharge medications will be authorized once you are discharged, as it is imperative that you return to your primary care physician (or establish a relationship with a primary care physician if you do not have one) for your aftercare needs so that they can  reassess your need for medications and monitor your lab values.    Today   CHIEF COMPLAINT:   Chief Complaint  Patient presents with  . Rectal Bleeding    VITAL SIGNS:  Blood pressure 129/62, pulse 90, temperature 97.5 F (36.4 C), temperature source Oral, resp. rate 16, height 5\' 1"  (1.549 m), weight 75.433 kg (166 lb 4.8 oz), SpO2 97 %.  I/O:   Intake/Output Summary (Last 24 hours) at 09/07/15 0954 Last data filed at 09/07/15 0900  Gross per 24 hour  Intake    822 ml  Output   1850 ml  Net  -1028 ml    PHYSICAL EXAMINATION:   Physical Exam  GENERAL:  54 y.o.-year-old patient lying in the bed with no acute distress.  EYES: Pupils equal, round, reactive to light and accommodation. No scleral icterus. Extraocular muscles intact.  HEENT: Head atraumatic, normocephalic. Oropharynx and nasopharynx clear.  NECK:  Supple, no jugular venous distention. No thyroid enlargement, no tenderness.  LUNGS: Normal breath sounds bilaterally, no wheezing, rales,rhonchi or crepitation. No use  of accessory muscles of respiration.  CARDIOVASCULAR: S1, S2 normal. No murmurs, rubs, or gallops.  ABDOMEN: Soft, non-distended, minimal tenderness in left lower quadrant. No guarding or rigidity. Bowel sounds present. No organomegaly or mass.  EXTREMITIES: No pedal edema, cyanosis, or clubbing.  NEUROLOGIC: Cranial nerves II through XII are intact. Muscle strength 5/5 in all extremities. Sensation intact. Gait not checked.  PSYCHIATRIC: The patient is sleepy, but when aroused she is alert and oriented x 3.  SKIN: No obvious rash, lesion, or ulcer.   DATA REVIEW:   CBC  Recent Labs Lab 09/07/15 0500  WBC 10.1  HGB 11.1*  HCT 33.3*  PLT 168    Chemistries   Recent Labs Lab 09/06/15 1435 09/07/15 0500  NA 137 141  K 3.7 3.5  CL 103 109  CO2 28 27  GLUCOSE 117* 125*  BUN 15 8  CREATININE 0.96 0.68  CALCIUM 8.9 8.6*  AST 24  --   ALT 18  --   ALKPHOS 124  --   BILITOT 0.7  --      Cardiac Enzymes No results for input(s): TROPONINI in the last 168 hours.  Microbiology Results  No results found for this or any previous visit.  RADIOLOGY:  Ct Abdomen Pelvis W Contrast  09/06/2015  CLINICAL DATA:  54 year old female with abdominal and pelvic pain, nausea vomiting and rectal bleeding for 1 day. History of breast cancer. EXAM: CT ABDOMEN AND PELVIS WITH CONTRAST TECHNIQUE: Multidetector CT imaging of the abdomen and pelvis was performed using the standard protocol following bolus administration of intravenous contrast. CONTRAST:  146mL OMNIPAQUE IOHEXOL 300 MG/ML  SOLN COMPARISON:  08/16/2015 PET-CT and prior exams FINDINGS: Lower chest:  Minimal left basilar scarring identified. Hepatobiliary: The liver and gallbladder are unremarkable. There is no evidence biliary dilatation. Pancreas: Unremarkable Spleen: Unremarkable Adrenals/Urinary Tract: The kidneys, adrenal glands and bladder are unremarkable. Stomach/Bowel: Diffuse circumferential wall thickening of the descending and sigmoid colon noted with adjacent inflammation compatible with colitis. There is no evidence of bowel obstruction. The appendix is normal. Vascular/Lymphatic: No enlarged lymph nodes. The vascular structures are unremarkable without evidence of aneurysm. The visualized celiac artery, SMA, IMA and SMV are patent. Reproductive: The uterus and adnexal regions are within normal limits. Other: A trace amount of pelvic fluid is noted. There is no evidence of abscess or pneumoperitoneum. Musculoskeletal: No acute or suspicious abnormalities identified. Remote left-sided rib fractures are identified. IMPRESSION: Descending and sigmoid colitis -likely inflammatory or infectious. No evidence of pneumoperitoneum, abscess or bowel obstruction. Electronically Signed   By: Margarette Canada M.D.   On: 09/06/2015 17:40    EKG:   Orders placed or performed in visit on 11/30/12  . EKG 12-Lead      Management plans  discussed with the patient, family and they are in agreement.  CODE STATUS:     Code Status Orders        Start     Ordered   09/06/15 2032  Full code   Continuous     09/06/15 2032    Advance Directive Documentation        Most Recent Value   Type of Advance Directive  Healthcare Power of Attorney   Pre-existing out of facility DNR order (yellow form or pink MOST form)     "MOST" Form in Place?        TOTAL TIME TAKING CARE OF THIS PATIENT: 37 minutes.    Gladstone Lighter M.D on 09/07/2015 at 9:54 AM  Between  7am to 6pm - Pager - [phone]  After 6pm go to [email or web address] - password [email or web address] [facility]  Office  [phone]  CC: Primary care physician; Loistine Chance, MD

## 2015-09-07 NOTE — Telephone Encounter (Signed)
Call [facility] at [place] [phone] regarding patient Deanna Baker. She needs to be scheduled for a 3 week follow up with Dr. Allen Norris for colitis. Thank you!

## 2015-09-07 NOTE — Discharge Instructions (Signed)
Soft diet for the next couple of days Avoid sea food for the next week  Follow all MD discharge instructions. Take all medications as prescribed. Keep all follow up appointments. If your symptoms return, call your doctor. If you experience any new symptoms that are of concern to you or that are bothersome to you, call your doctor. For all questions and/or concerns, call your doctor.   If you have a medical emergency, call 911   Colitis Colitis is inflammation of the colon. Colitis may last a short time (acute) or it may last a long time (chronic). CAUSES This condition may be caused by:  Viruses.  Bacteria.  Reactions to medicine.  Certain autoimmune diseases, such as Crohn disease or ulcerative colitis. SYMPTOMS Symptoms of this condition include:  Diarrhea.  Passing bloody or tarry stool.  Pain.  Fever.  Vomiting.  Tiredness (fatigue).  Weight loss.  Bloating.  Sudden increase in abdominal pain.  Having fewer bowel movements than usual. DIAGNOSIS This condition is diagnosed with a stool test or a blood test. You may also have other tests, including X-rays, a CT scan, or a colonoscopy. TREATMENT Treatment may include:  Resting the bowel. This involves not eating or drinking for a period of time.  Fluids that are given through an IV tube.  Medicine for pain and diarrhea.  Antibiotic medicines.  Cortisone medicines.  Surgery. HOME CARE INSTRUCTIONS Eating and Drinking  Follow instructions from your health care provider about eating or drinking restrictions.  Drink enough fluid to keep your urine clear or pale yellow.  Work with a dietitian to determine which foods cause your condition to flare up.  Avoid foods that cause flare-ups.  Eat a well-balanced diet. Medicines  Take over-the-counter and prescription medicines only as told by your health care provider.  If you were prescribed an antibiotic medicine, take it as told by your health care  provider. Do not stop taking the antibiotic even if you start to feel better. General Instructions  Keep all follow-up visits as told by your health care provider. This is important. SEEK MEDICAL CARE IF:  Your symptoms do not go away.  You develop new symptoms. SEEK IMMEDIATE MEDICAL CARE IF:  You have a fever that does not go away with treatment.  You develop chills.  You have extreme weakness, fainting, or dehydration.  You have repeated vomiting.  You develop severe pain in your abdomen.  You pass bloody or tarry stool.   This information is not intended to replace advice given to you by your health care provider. Make sure you discuss any questions you have with your health care provider.   Document Released: 12/18/2004 Document Revised: 08/01/2015 Document Reviewed: 03/05/2015 Elsevier Interactive Patient Education [employer].

## 2015-09-10 ENCOUNTER — Inpatient Hospital Stay: Payer: BLUE CROSS/BLUE SHIELD

## 2015-09-10 ENCOUNTER — Inpatient Hospital Stay: Payer: BLUE CROSS/BLUE SHIELD | Admitting: Oncology

## 2015-09-10 NOTE — Telephone Encounter (Signed)
appt has been made 09/25/2015

## 2015-09-12 ENCOUNTER — Other Ambulatory Visit: Payer: Self-pay | Admitting: Family Medicine

## 2015-09-14 ENCOUNTER — Ambulatory Visit: Payer: BLUE CROSS/BLUE SHIELD | Admitting: Radiation Oncology

## 2015-09-17 ENCOUNTER — Encounter: Payer: Self-pay | Admitting: Family Medicine

## 2015-09-18 ENCOUNTER — Encounter: Payer: Self-pay | Admitting: Oncology

## 2015-09-18 ENCOUNTER — Inpatient Hospital Stay (HOSPITAL_BASED_OUTPATIENT_CLINIC_OR_DEPARTMENT_OTHER): Payer: BLUE CROSS/BLUE SHIELD | Admitting: Oncology

## 2015-09-18 ENCOUNTER — Inpatient Hospital Stay: Payer: BLUE CROSS/BLUE SHIELD | Attending: Oncology

## 2015-09-18 ENCOUNTER — Ambulatory Visit (INDEPENDENT_AMBULATORY_CARE_PROVIDER_SITE_OTHER): Payer: BLUE CROSS/BLUE SHIELD | Admitting: Family Medicine

## 2015-09-18 VITALS — BP 117/81 | HR 80 | Temp 95.9°F | Wt 167.5 lb

## 2015-09-18 DIAGNOSIS — Z923 Personal history of irradiation: Secondary | ICD-10-CM

## 2015-09-18 DIAGNOSIS — Z86010 Personal history of colonic polyps: Secondary | ICD-10-CM

## 2015-09-18 DIAGNOSIS — Z853 Personal history of malignant neoplasm of breast: Secondary | ICD-10-CM

## 2015-09-18 DIAGNOSIS — Z171 Estrogen receptor negative status [ER-]: Secondary | ICD-10-CM | POA: Insufficient documentation

## 2015-09-18 DIAGNOSIS — Z87891 Personal history of nicotine dependence: Secondary | ICD-10-CM

## 2015-09-18 DIAGNOSIS — R102 Pelvic and perineal pain: Secondary | ICD-10-CM | POA: Diagnosis not present

## 2015-09-18 DIAGNOSIS — Z9221 Personal history of antineoplastic chemotherapy: Secondary | ICD-10-CM

## 2015-09-18 DIAGNOSIS — R0781 Pleurodynia: Secondary | ICD-10-CM

## 2015-09-18 DIAGNOSIS — I10 Essential (primary) hypertension: Secondary | ICD-10-CM

## 2015-09-18 DIAGNOSIS — Z79899 Other long term (current) drug therapy: Secondary | ICD-10-CM | POA: Diagnosis not present

## 2015-09-18 DIAGNOSIS — R109 Unspecified abdominal pain: Secondary | ICD-10-CM

## 2015-09-18 DIAGNOSIS — C50412 Malignant neoplasm of upper-outer quadrant of left female breast: Secondary | ICD-10-CM

## 2015-09-18 DIAGNOSIS — M797 Fibromyalgia: Secondary | ICD-10-CM | POA: Diagnosis not present

## 2015-09-18 LAB — COMPREHENSIVE METABOLIC PANEL
ALBUMIN: 3.8 g/dL (ref 3.5–5.0)
ALK PHOS: 102 U/L (ref 38–126)
ALT: 19 U/L (ref 14–54)
AST: 34 U/L (ref 15–41)
Anion gap: 5 (ref 5–15)
BUN: 13 mg/dL (ref 6–20)
CALCIUM: 8.5 mg/dL — AB (ref 8.9–10.3)
CHLORIDE: 104 mmol/L (ref 101–111)
CO2: 25 mmol/L (ref 22–32)
CREATININE: 0.93 mg/dL (ref 0.44–1.00)
GFR calc non Af Amer: >60 mL/min (ref >60)
GLUCOSE: 115 mg/dL — AB (ref 65–99)
Potassium: 3.9 mmol/L (ref 3.5–5.1)
SODIUM: 134 mmol/L — AB (ref 135–145)
Total Bilirubin: 0.4 mg/dL (ref 0.3–1.2)
Total Protein: 6.8 g/dL (ref 6.5–8.1)

## 2015-09-18 LAB — CBC WITH DIFFERENTIAL/PLATELET
BASOS ABS: 0 10*3/uL (ref 0–0.1)
BASOS PCT: 0 %
EOS ABS: 0.1 10*3/uL (ref 0–0.7)
EOS PCT: 2 %
HCT: 36.1 % (ref 35.0–47.0)
HEMOGLOBIN: 11.7 g/dL — AB (ref 12.0–16.0)
LYMPHS ABS: 0.9 10*3/uL — AB (ref 1.0–3.6)
Lymphocytes Relative: 13 %
MCH: 28.1 pg (ref 26.0–34.0)
MCHC: 32.5 g/dL (ref 32.0–36.0)
MCV: 86.6 fL (ref 80.0–100.0)
Monocytes Absolute: 0.3 10*3/uL (ref 0.2–0.9)
Monocytes Relative: 5 %
NEUTROS PCT: 80 %
Neutro Abs: 5.3 10*3/uL (ref 1.4–6.5)
PLATELETS: 319 10*3/uL (ref 150–440)
RBC: 4.16 MIL/uL (ref 3.80–5.20)
RDW: 14.9 % — ABNORMAL HIGH (ref 11.5–14.5)
WBC: 6.7 10*3/uL (ref 3.6–11.0)

## 2015-09-18 NOTE — Progress Notes (Signed)
Patient recently in hospital for bacterial infection from shrimp she ate out.

## 2015-09-20 ENCOUNTER — Ambulatory Visit (INDEPENDENT_AMBULATORY_CARE_PROVIDER_SITE_OTHER): Payer: BLUE CROSS/BLUE SHIELD | Admitting: Family Medicine

## 2015-09-20 ENCOUNTER — Encounter: Payer: Self-pay | Admitting: Family Medicine

## 2015-09-20 VITALS — BP 114/72 | HR 95 | Temp 98.7°F | Resp 16 | Ht 61.0 in | Wt 166.3 lb

## 2015-09-20 DIAGNOSIS — Z09 Encounter for follow-up examination after completed treatment for conditions other than malignant neoplasm: Secondary | ICD-10-CM | POA: Diagnosis not present

## 2015-09-20 DIAGNOSIS — K529 Noninfective gastroenteritis and colitis, unspecified: Secondary | ICD-10-CM | POA: Diagnosis not present

## 2015-09-20 DIAGNOSIS — D508 Other iron deficiency anemias: Secondary | ICD-10-CM | POA: Diagnosis not present

## 2015-09-20 NOTE — Progress Notes (Signed)
Name: Deanna Baker   MRN: [id number]    DOB: [birth year]   Date:09/20/2015       Progress Note  Subjective  Chief Complaint  Chief Complaint  Patient presents with  . Hospitalization Follow-up    2 week F/U  . Acute Colitis    Patient ate some bad seafood-shrimp (that was infected with bacteria) and was having abdominal pain and bloody diarrhea, Improved since finished the antibiotic    HPI  Acute Colitis: she went out to eat sea-food at [facility] ( shrimp ) two weeks ago and developed acute onset ( within 90 minutes )  of nausea/vomiting and bloody diarrhea. She stayed at home overnight, but woke up with abdominal cramping and still had bloody stools, and went to [facility] for further evaluation. Diagnosed with acute colitis / food poisoning and discharge with Cipro and Flagyl . She had anemia and hypocalcemia while in the hospital. She has been feeling well over the past week.    Patient Active Problem List   Diagnosis Date Noted  . Thickening of esophagus 08/03/2015  . Closed rib fracture 08/03/2015  . Choking 07/04/2015  . Benign essential HTN 05/01/2015  . CFIDS (chronic fatigue and immune dysfunction syndrome) 05/01/2015  . Anxiety and depression 05/01/2015  . Dyslipidemia 05/01/2015  . Fibromyalgia syndrome 05/01/2015  . Gastro-esophageal reflux disease without esophagitis 05/01/2015  . Blood glucose elevated 05/01/2015  . Eczema intertrigo 05/01/2015  . Chronic migraine without aura without status migrainosus, not intractable 05/01/2015  . Excessive urination at night 05/01/2015  . Dysmetabolic syndrome 38/18/2993  . History of colonoscopy with polypectomy 05/01/2015  . Central sleep apnea 05/01/2015  . History of back surgery 05/01/2015  . Thickened nails 05/01/2015  . Malignant neoplasm of upper-outer quadrant of female breast, triple negative 06/13/2013    Past Surgical History  Procedure Laterality Date  . Upper gi endoscopy      Dr. Suzette Battiest  . Shoulder surgery  Left 2009  . Colonoscopy  2013    Dr. Suzette Battiest   . Tubal ligation  1982  . Tonsillectomy  1965  . Breast lumpectomy Left 12/06/12     lumpectomy with SN biopsy and power port placement...    Family History  Problem Relation Age of Onset  . Breast cancer Mother     Social History   Social History  . Marital Status: Married    Spouse Name: N/A  . Number of Children: N/A  . Years of Education: N/A   Occupational History  . Not on file.   Social History Main Topics  . Smoking status: Former Smoker -- 1.00 packs/day for 20 years    Types: Cigarettes    Quit date: 11/24/1980  . Smokeless tobacco: Never Used  . Alcohol Use: 0.0 oz/week    0 Standard drinks or equivalent per week     Comment: rarely  . Drug Use: No  . Sexual Activity:    Partners: Male   Other Topics Concern  . Not on file   Social History Narrative     Current outpatient prescriptions:  .  ALPRAZolam (XANAX) 0.5 MG tablet, Take 1 tablet (0.5 mg total) by mouth 2 (two) times daily as needed for anxiety., Disp: 30 tablet, Rfl: 1 .  amLODipine-benazepril (LOTREL) 5-20 MG capsule, TAKE ONE CAPSULE BY MOUTH EVERY EVENING FOR BLOOD PRESSURE, Disp: 30 capsule, Rfl: 6 .  buPROPion (WELLBUTRIN XL) 300 MG 24 hr tablet, TAKE 1 TABLET EVERY DAY, Disp: 30 tablet, Rfl: 6 .  gabapentin (NEURONTIN) 400 MG capsule, Take 1 capsule (400 mg total) by mouth 3 (three) times daily., Disp: 90 capsule, Rfl: 5 .  HYDROcodone-acetaminophen (NORCO) 10-325 MG per tablet, Take 1 tablet by mouth 3 (three) times daily as needed., Disp: , Rfl:  .  meloxicam (MOBIC) 15 MG tablet, Take 15 mg by mouth as needed. , Disp: , Rfl:  .  nitroGLYCERIN (NITROSTAT) 0.4 MG SL tablet, Place 1 tablet under the tongue as needed., Disp: , Rfl:  .  omeprazole (PRILOSEC) 40 MG capsule, Take 1 capsule (40 mg total) by mouth daily., Disp: 30 capsule, Rfl: 5 .  ondansetron (ZOFRAN) 4 MG tablet, Take 1 tablet (4 mg total) by mouth every 8 (eight) hours as  needed for nausea or vomiting., Disp: 20 tablet, Rfl: 0 .  PARoxetine (PAXIL) 40 MG tablet, Take 1 tablet (40 mg total) by mouth every morning., Disp: 30 tablet, Rfl: 5 .  ranitidine (ZANTAC) 150 MG tablet, Take 1 tablet (150 mg total) by mouth daily., Disp: 30 tablet, Rfl: 5 .  traMADol (ULTRAM) 50 MG tablet, Take 1 tablet (50 mg total) by mouth every 6 (six) hours as needed., Disp: 60 tablet, Rfl: 2  Allergies  Allergen Reactions  . Pregabalin     weight gain and blurred vision  . Shellfish Allergy Other (See Comments)    Reports possible episode of lip swelling after having shrimp.  . Venlafaxine     foggy minded     ROS  Ten systems reviewed and is negative except as mentioned in HPI   Objective  Filed Vitals:   09/20/15 1532  BP: 114/72  Pulse: 95  Temp: 98.7 F (37.1 C)  TempSrc: Oral  Resp: 16  Height: 5' 1" (1.549 m)  Weight: 166 lb 4.8 oz (75.433 kg)  SpO2: 96%    Body mass index is 31.44 kg/(m^2).  Physical Exam  Constitutional: Patient appears well-developed and well-nourished. Obese No distress.  HEENT: head atraumatic, normocephalic, pupils equal and reactive to light, neck supple, throat within normal limits Cardiovascular: Normal rate, regular rhythm and normal heart sounds.  No murmur heard. No BLE edema. Pulmonary/Chest: Effort normal and breath sounds normal. No respiratory distress. Abdominal: Soft.  There is no tenderness. Psychiatric: Patient has a normal mood and affect. behavior is normal. Judgment and thought content normal. Looks sleepy   Recent Results (from the past 2160 hour(s))  Glucose, capillary     Status: Abnormal   Collection Time: 08/16/15  7:25 AM  Result Value Ref Range   Glucose-Capillary 124 (H) 65 - 99 mg/dL  Comprehensive metabolic panel     Status: Abnormal   Collection Time: 09/06/15  2:35 PM  Result Value Ref Range   Sodium 137 135 - 145 mmol/L   Potassium 3.7 3.5 - 5.1 mmol/L   Chloride 103 101 - 111 mmol/L   CO2 28  22 - 32 mmol/L   Glucose, Bld 117 (H) 65 - 99 mg/dL   BUN 15 6 - 20 mg/dL   Creatinine, Ser 0.96 0.44 - 1.00 mg/dL   Calcium 8.9 8.9 - 10.3 mg/dL   Total Protein 6.3 (L) 6.5 - 8.1 g/dL   Albumin 3.5 3.5 - 5.0 g/dL   AST 24 15 - 41 U/L   ALT 18 14 - 54 U/L   Alkaline Phosphatase 124 38 - 126 U/L   Total Bilirubin 0.7 0.3 - 1.2 mg/dL   GFR calc non Af Amer >60 >60 mL/min   GFR calc Af  Amer >60 >60 mL/min    Comment: (NOTE) The eGFR has been calculated using the CKD EPI equation. This calculation has not been validated in all clinical situations. eGFR's persistently <60 mL/min signify possible Chronic Kidney Disease.    Anion gap 6 5 - 15  CBC     Status: Abnormal   Collection Time: 09/06/15  2:35 PM  Result Value Ref Range   WBC 10.4 3.6 - 11.0 K/uL   RBC 3.99 3.80 - 5.20 MIL/uL   Hemoglobin 11.5 (L) 12.0 - 16.0 g/dL   HCT 35.1 35.0 - 47.0 %   MCV 88.1 80.0 - 100.0 fL   MCH 28.9 26.0 - 34.0 pg   MCHC 32.8 32.0 - 36.0 g/dL   RDW 15.2 (H) 11.5 - 14.5 %   Platelets 212 150 - 440 K/uL  Type and screen [place]     Status: None   Collection Time: 09/06/15  2:35 PM  Result Value Ref Range   ABO/RH(D) A POS    Antibody Screen NEG    Sample Expiration 09/09/2015   ABO/Rh     Status: None   Collection Time: 09/06/15  2:36 PM  Result Value Ref Range   ABO/RH(D) A POS   Hemoglobin     Status: Abnormal   Collection Time: 09/07/15 12:02 AM  Result Value Ref Range   Hemoglobin 10.2 (L) 12.0 - 16.0 g/dL  CBC     Status: Abnormal   Collection Time: 09/07/15  5:00 AM  Result Value Ref Range   WBC 10.1 3.6 - 11.0 K/uL   RBC 3.73 (L) 3.80 - 5.20 MIL/uL   Hemoglobin 11.1 (L) 12.0 - 16.0 g/dL   HCT 33.3 (L) 35.0 - 47.0 %   MCV 89.2 80.0 - 100.0 fL   MCH 29.9 26.0 - 34.0 pg   MCHC 33.4 32.0 - 36.0 g/dL   RDW 15.1 (H) 11.5 - 14.5 %   Platelets 168 150 - 440 K/uL  Basic metabolic panel     Status: Abnormal   Collection Time: 09/07/15  5:00 AM  Result Value  Ref Range   Sodium 141 135 - 145 mmol/L   Potassium 3.5 3.5 - 5.1 mmol/L   Chloride 109 101 - 111 mmol/L   CO2 27 22 - 32 mmol/L   Glucose, Bld 125 (H) 65 - 99 mg/dL   BUN 8 6 - 20 mg/dL   Creatinine, Ser 0.68 0.44 - 1.00 mg/dL   Calcium 8.6 (L) 8.9 - 10.3 mg/dL   GFR calc non Af Amer >60 >60 mL/min   GFR calc Af Amer >60 >60 mL/min    Comment: (NOTE) The eGFR has been calculated using the CKD EPI equation. This calculation has not been validated in all clinical situations. eGFR's persistently <60 mL/min signify possible Chronic Kidney Disease.    Anion gap 5 5 - 15  CBC with Differential     Status: Abnormal   Collection Time: 09/18/15 11:33 AM  Result Value Ref Range   WBC 6.7 3.6 - 11.0 K/uL   RBC 4.16 3.80 - 5.20 MIL/uL   Hemoglobin 11.7 (L) 12.0 - 16.0 g/dL   HCT 36.1 35.0 - 47.0 %   MCV 86.6 80.0 - 100.0 fL   MCH 28.1 26.0 - 34.0 pg   MCHC 32.5 32.0 - 36.0 g/dL   RDW 14.9 (H) 11.5 - 14.5 %   Platelets 319 150 - 440 K/uL   Neutrophils Relative % 80 %   Neutro  Abs 5.3 1.4 - 6.5 K/uL   Lymphocytes Relative 13 %   Lymphs Abs 0.9 (L) 1.0 - 3.6 K/uL   Monocytes Relative 5 %   Monocytes Absolute 0.3 0.2 - 0.9 K/uL   Eosinophils Relative 2 %   Eosinophils Absolute 0.1 0 - 0.7 K/uL   Basophils Relative 0 %   Basophils Absolute 0.0 0 - 0.1 K/uL  Comprehensive metabolic panel     Status: Abnormal   Collection Time: 09/18/15 11:33 AM  Result Value Ref Range   Sodium 134 (L) 135 - 145 mmol/L   Potassium 3.9 3.5 - 5.1 mmol/L   Chloride 104 101 - 111 mmol/L   CO2 25 22 - 32 mmol/L   Glucose, Bld 115 (H) 65 - 99 mg/dL   BUN 13 6 - 20 mg/dL   Creatinine, Ser 0.93 0.44 - 1.00 mg/dL   Calcium 8.5 (L) 8.9 - 10.3 mg/dL   Total Protein 6.8 6.5 - 8.1 g/dL   Albumin 3.8 3.5 - 5.0 g/dL   AST 34 15 - 41 U/L   ALT 19 14 - 54 U/L   Alkaline Phosphatase 102 38 - 126 U/L   Total Bilirubin 0.4 0.3 - 1.2 mg/dL   GFR calc non Af Amer >60 >60 mL/min   GFR calc Af Amer >60 >60 mL/min     Comment: (NOTE) The eGFR has been calculated using the CKD EPI equation. This calculation has not been validated in all clinical situations. eGFR's persistently <60 mL/min signify possible Chronic Kidney Disease.    Anion gap 5 5 - 15     PHQ2/9: Depression screen [facility] 2/9 09/20/2015 05/01/2015  Decreased Interest 0 1  Down, Depressed, Hopeless 0 1  PHQ - 2 Score 0 2  Altered sleeping - 0  Tired, decreased energy - 2  Change in appetite - 1  Feeling bad or failure about yourself  - 1  Trouble concentrating - 3  Moving slowly or fidgety/restless - 2  Suicidal thoughts - 1  PHQ-9 Score - 12  Difficult doing work/chores - Very difficult   Fall Risk: Fall Risk  09/20/2015 05/01/2015  Falls in the past year? Yes No  Number falls in past yr: 1 -  Injury with Fall? No -      Assessment & Plan  1. Colitis  Doing well now, finished antibiotics, has follow up with Dr. Durwin Reges in one week  2. Hospital discharge follow-up  Feeling fatigued, had labs done by Dr. Jeb Levering this week, and hgb has improved, she went there for regular follow up ( breast cancer )  3. Other iron deficiency anemias  We will recheck next visit  4. Hypocalcemia  We will check ionized calcium, since calcium has been low and patient is very tired - Calcium

## 2015-09-21 LAB — CALCIUM, IONIZED: Calcium, Ion: 5.1 mg/dL (ref 4.5–5.6)

## 2015-09-22 ENCOUNTER — Encounter: Payer: Self-pay | Admitting: Oncology

## 2015-09-22 NOTE — Progress Notes (Signed)
Deanna Baker @ [facility] [phone]  Fax:([phone]) [place]: 1961-04-22  MR#: [id number]  YIR#:[id number]  Patient Care Team: Steele Sizer, MD as PCP - General (Family Medicine) Seeplaputhur Robinette Haines, MD (General Surgery) Steele Sizer, MD as Attending Physician (Family Medicine)  CHIEF COMPLAINT:  Chief Complaint  Patient presents with  . OTHER   Oncology history   1. Carcinoma of breast (left breast biopsy) November 15, 2012. upper and outer quadrant 2. Patient underwent lumpectomyand sentinel lymph node examination.  pT1C  pN0 sen M0 stage IC, triple negative  disease.   3. Started on Cytoxan and Adriamycin from December 17, 2011.   received 4 cycles of Cytoxan and Adriamycin, February 17, 2013. 4. Patient started weekly Taxol chemotherapy from March 10, 2013. 5. finished with Taxol on June 02, 2013 6local radiation therapy 7.BRCA mutation has been reported   it be negative.  (Was done in January of 2014)    Malignant neoplasm of upper-outer quadrant of female breast, triple negative   06/13/2013 Initial Diagnosis Malignant neoplasm of upper-outer quadrant of female breast, triple negative    Oncology Flowsheet 09/06/2015 09/06/2015  ALPRAZolam (XANAX) PO 0.5 mg    ondansetron (ZOFRAN) IV 4 mg 4 mg    INTERVAL HISTORY:  54 year old lady with a history of left rib cage pain.  Patient has a stage 1C node negative, triple negative disease.  Patient started having left rib cage pain several weeks ago.  Cannot lie down any comfortable position.  Patient had been evaluated by primary care physician and Dr. Jamal Collin has  ordeed  CT scan.  No evidence of abnormality was found.  Pain is constant dull aching.  The patient is quite concerned about metastatic disease.  Patient had a significant bone pain which has been evaluated with CT scan followed by bone scan and a PET scan. Over.  Of 3 months bony pain has improved Bone scan and a PET scan is now being  reviewed independently.  Patient is here for further follow-up and to discuss the results of the PET scan REVIEW OF SYSTEMS:   GENERAL:  Feels good.  Active.  No fevers, sweats or weight loss. PERFORMANCE STATUS (ECOG): 0 HEENT:  No visual changes, runny nose, sore throat, mouth sores or tenderness. Lungs: No shortness of breath or cough.  No hemoptysis. Cardiac:  No chest pain, palpitations, orthopnea, or PND. GI:  No nausea, vomiting, diarrhea, constipation, melena or hematochezia. GU:  No urgency, frequency, dysuria, or hematuria. Musculoskeletal: Significant improvement in bony pains Extremities:  No pain or swelling. Skin:  No rashes or skin changes. Neuro:  No headache, numbness or weakness, balance or coordination issues. Endocrine:  No diabetes, thyroid issues, hot flashes or night sweats. Psych:  No mood changes, depression or anxiety. Pain:  No focal pain. Review of systems:  All other systems reviewed and found to be negative. PAST MEDICAL HISTORY: Past Medical History  Diagnosis Date  . Fibromyalgia 1993  . Arthritis 1990  . Hypertension 2012  . Heart murmur   . Measles 1960  . Lump or mass in breast   . Mumps 1970  . Chronic fatigue syndrome   . Colon polyp 2013  . Malignant neoplasm of upper-outer quadrant of female breast ([place])     Invasive mammary carcinoma, triple negative    PAST SURGICAL HISTORY: Past Surgical History  Procedure Laterality Date  . Upper gi endoscopy      Dr. Suzette Battiest  . Shoulder  surgery Left 2009  . Colonoscopy  2013    Dr. Suzette Battiest   . Tubal ligation  1982  . Tonsillectomy  1965  . Breast lumpectomy Left 12/06/12     lumpectomy with SN biopsy and power port placement...    FAMILY HISTORY Family History  Problem Relation Age of Onset  . Breast cancer Mother     ADVANCED DIRECTIVES:  No flowsheet data found.  HEALTH MAINTENANCE: Social History  Substance Use Topics  . Smoking status: Former Smoker -- 1.00 packs/day for 20  years    Types: Cigarettes    Quit date: 11/24/1980  . Smokeless tobacco: Never Used  . Alcohol Use: 0.0 oz/week    0 Standard drinks or equivalent per week     Comment: rarely      Allergies  Allergen Reactions  . Pregabalin     weight gain and blurred vision  . Shellfish Allergy Other (See Comments)    Reports possible episode of lip swelling after having shrimp.  . Venlafaxine     foggy minded    Current Outpatient Prescriptions  Medication Sig Dispense Refill  . ALPRAZolam (XANAX) 0.5 MG tablet Take 1 tablet (0.5 mg total) by mouth 2 (two) times daily as needed for anxiety. 30 tablet 1  . amLODipine-benazepril (LOTREL) 5-20 MG capsule TAKE ONE CAPSULE BY MOUTH EVERY EVENING FOR BLOOD PRESSURE 30 capsule 6  . buPROPion (WELLBUTRIN XL) 300 MG 24 hr tablet TAKE 1 TABLET EVERY DAY 30 tablet 6  . gabapentin (NEURONTIN) 400 MG capsule Take 1 capsule (400 mg total) by mouth 3 (three) times daily. 90 capsule 5  . HYDROcodone-acetaminophen (NORCO) 10-325 MG per tablet Take 1 tablet by mouth 3 (three) times daily as needed.    . meloxicam (MOBIC) 15 MG tablet Take 15 mg by mouth as needed.     . nitroGLYCERIN (NITROSTAT) 0.4 MG SL tablet Place 1 tablet under the tongue as needed.    Marland Kitchen omeprazole (PRILOSEC) 40 MG capsule Take 1 capsule (40 mg total) by mouth daily. 30 capsule 5  . ondansetron (ZOFRAN) 4 MG tablet Take 1 tablet (4 mg total) by mouth every 8 (eight) hours as needed for nausea or vomiting. 20 tablet 0  . PARoxetine (PAXIL) 40 MG tablet Take 1 tablet (40 mg total) by mouth every morning. 30 tablet 5  . ranitidine (ZANTAC) 150 MG tablet Take 1 tablet (150 mg total) by mouth daily. 30 tablet 5  . traMADol (ULTRAM) 50 MG tablet Take 1 tablet (50 mg total) by mouth every 6 (six) hours as needed. 60 tablet 2   No current facility-administered medications for this visit.    OBJECTIVE:  Filed Vitals:   09/18/15 1248  BP: 117/81  Pulse: 80  Temp: 95.9 F (35.5 C)     Body  mass index is 31.67 kg/(m^2).    ECOG FS:1 - Symptomatic but completely ambulatory  PHYSICAL EXAM: Goal status: Performance status is good.  Patient has not lost significant weight HEENT: No evidence of stomatitis. Sclera and conjunctivae :: No jaundice.   pale looking. Lungs: Air  entry equal on both sides.  No rhonchi.  No rales.  Cardiac: Heart sounds are normal.  No pericardial rub.  No murmur. Lymphatic system: Cervical, axillary, inguinal, lymph nodes not palpable GI: Abdomen is soft.  No ascites.  Liver spleen not palpable.  No tenderness.  Bowel sounds are within normal limit Lower extremity: No edema Neurological system: Higher functions, cranial nerves intact no evidence  of peripheral neuropathy. Skin: No rash.  No ecchymosis.. Examination of both breast within normal limit Examination of musculoskeletal system no abnormality Tenderness in the left rib cage area   LAB RESULTS:  CBC Latest Ref Rng 09/18/2015 09/07/2015  WBC 3.6 - 11.0 K/uL 6.7 10.1  Hemoglobin 12.0 - 16.0 g/dL 11.7(L) 11.1(L)  Hematocrit 35.0 - 47.0 % 36.1 33.3(L)  Platelets 150 - 440 K/uL 319 168    Appointment on 09/18/2015  Component Date Value Ref Range Status  . WBC 09/18/2015 6.7  3.6 - 11.0 K/uL Final  . RBC 09/18/2015 4.16  3.80 - 5.20 MIL/uL Final  . Hemoglobin 09/18/2015 11.7* 12.0 - 16.0 g/dL Final  . HCT 09/18/2015 36.1  35.0 - 47.0 % Final  . MCV 09/18/2015 86.6  80.0 - 100.0 fL Final  . MCH 09/18/2015 28.1  26.0 - 34.0 pg Final  . MCHC 09/18/2015 32.5  32.0 - 36.0 g/dL Final  . RDW 09/18/2015 14.9* 11.5 - 14.5 % Final  . Platelets 09/18/2015 319  150 - 440 K/uL Final  . Neutrophils Relative % 09/18/2015 80   Final  . Neutro Abs 09/18/2015 5.3  1.4 - 6.5 K/uL Final  . Lymphocytes Relative 09/18/2015 13   Final  . Lymphs Abs 09/18/2015 0.9* 1.0 - 3.6 K/uL Final  . Monocytes Relative 09/18/2015 5   Final  . Monocytes Absolute 09/18/2015 0.3  0.2 - 0.9 K/uL Final  . Eosinophils Relative  09/18/2015 2   Final  . Eosinophils Absolute 09/18/2015 0.1  0 - 0.7 K/uL Final  . Basophils Relative 09/18/2015 0   Final  . Basophils Absolute 09/18/2015 0.0  0 - 0.1 K/uL Final  . Sodium 09/18/2015 134* 135 - 145 mmol/L Final  . Potassium 09/18/2015 3.9  3.5 - 5.1 mmol/L Final  . Chloride 09/18/2015 104  101 - 111 mmol/L Final  . CO2 09/18/2015 25  22 - 32 mmol/L Final  . Glucose, Bld 09/18/2015 115* 65 - 99 mg/dL Final  . BUN 09/18/2015 13  6 - 20 mg/dL Final  . Creatinine, Ser 09/18/2015 0.93  0.44 - 1.00 mg/dL Final  . Calcium 09/18/2015 8.5* 8.9 - 10.3 mg/dL Final  . Total Protein 09/18/2015 6.8  6.5 - 8.1 g/dL Final  . Albumin 09/18/2015 3.8  3.5 - 5.0 g/dL Final  . AST 09/18/2015 34  15 - 41 U/L Final  . ALT 09/18/2015 19  14 - 54 U/L Final  . Alkaline Phosphatase 09/18/2015 102  38 - 126 U/L Final  . Total Bilirubin 09/18/2015 0.4  0.3 - 1.2 mg/dL Final  . GFR calc non Af Amer 09/18/2015 >60  >60 mL/min Final  . GFR calc Af Amer 09/18/2015 >60  >60 mL/min Final   Comment: (NOTE) The eGFR has been calculated using the CKD EPI equation. This calculation has not been validated in all clinical situations. eGFR's persistently <60 mL/min signify possible Chronic Kidney Disease.   . Anion gap 09/18/2015 5  5 - 15 Final       STUDIES: Ct Abdomen Pelvis W Contrast  09/06/2015  CLINICAL DATA:  54 year old female with abdominal and pelvic pain, nausea vomiting and rectal bleeding for 1 day. History of breast cancer. EXAM: CT ABDOMEN AND PELVIS WITH CONTRAST TECHNIQUE: Multidetector CT imaging of the abdomen and pelvis was performed using the standard protocol following bolus administration of intravenous contrast. CONTRAST:  166m OMNIPAQUE IOHEXOL 300 MG/ML  SOLN COMPARISON:  08/16/2015 PET-CT and prior exams FINDINGS: Lower chest:  Minimal  left basilar scarring identified. Hepatobiliary: The liver and gallbladder are unremarkable. There is no evidence biliary dilatation. Pancreas:  Unremarkable Spleen: Unremarkable Adrenals/Urinary Tract: The kidneys, adrenal glands and bladder are unremarkable. Stomach/Bowel: Diffuse circumferential wall thickening of the descending and sigmoid colon noted with adjacent inflammation compatible with colitis. There is no evidence of bowel obstruction. The appendix is normal. Vascular/Lymphatic: No enlarged lymph nodes. The vascular structures are unremarkable without evidence of aneurysm. The visualized celiac artery, SMA, IMA and SMV are patent. Reproductive: The uterus and adnexal regions are within normal limits. Other: A trace amount of pelvic fluid is noted. There is no evidence of abscess or pneumoperitoneum. Musculoskeletal: No acute or suspicious abnormalities identified. Remote left-sided rib fractures are identified. IMPRESSION: Descending and sigmoid colitis -likely inflammatory or infectious. No evidence of pneumoperitoneum, abscess or bowel obstruction. Electronically Signed   By: Margarette Canada M.D.   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  Abdominal pain.  CT scan of abdomen was done on 13th of October which has been reviewed independently and shows possibility of descending and sigmoid colon likely inflammatory or infectious changes Will discuss with Dr. Jamal Collin for possibility of colonoscopy if it is not done in last few years.  Reviewing records Patient last colonoscopy was in 2013 MEDICAL DECISION MAKING:   Stop the changes in the bone scan and PET scans for traumatic in nature which is now gradually healing Continue follow-up without any intervention  No matching staging information was found for the patient.  Deanna Gleason, MD   09/22/2015 4:28 PM

## 2015-09-25 ENCOUNTER — Ambulatory Visit (INDEPENDENT_AMBULATORY_CARE_PROVIDER_SITE_OTHER): Payer: BLUE CROSS/BLUE SHIELD | Admitting: Gastroenterology

## 2015-09-25 ENCOUNTER — Encounter: Payer: Self-pay | Admitting: Gastroenterology

## 2015-09-25 VITALS — BP 106/65 | HR 90 | Temp 97.8°F | Ht 61.0 in | Wt 165.8 lb

## 2015-09-25 DIAGNOSIS — R938 Abnormal findings on diagnostic imaging of other specified body structures: Secondary | ICD-10-CM | POA: Diagnosis not present

## 2015-09-25 DIAGNOSIS — R1310 Dysphagia, unspecified: Secondary | ICD-10-CM

## 2015-09-25 DIAGNOSIS — R9389 Abnormal findings on diagnostic imaging of other specified body structures: Secondary | ICD-10-CM

## 2015-09-25 NOTE — Progress Notes (Signed)
Primary Care Physician: Loistine Chance, MD  Primary Gastroenterologist:  Dr. Lucilla Lame  Chief Complaint  Patient presents with  . Follow up hospital Colitis  . Dysphagia    HPI: Deanna Baker is a 54 y.o. female here because of dysphagia with GERD.  The patient reports that she was seen by a surgeon for some chest pain and was sent for a CAT scan.  On the CAT scan the patient was found to have  thickening of the esophagus.  The patient was supposed to follow up with me shortly after that but presented to the emergency room because of bloody diarrhea and swelling in her mouth and lips.The patient reports that the bloody diarrhea and abdominal pain has completely subsided  Current Outpatient Prescriptions  Medication Sig Dispense Refill  . ALPRAZolam (XANAX) 0.5 MG tablet Take 1 tablet (0.5 mg total) by mouth 2 (two) times daily as needed for anxiety. 30 tablet 1  . amLODipine-benazepril (LOTREL) 5-20 MG capsule TAKE ONE CAPSULE BY MOUTH EVERY EVENING FOR BLOOD PRESSURE 30 capsule 6  . buPROPion (WELLBUTRIN XL) 300 MG 24 hr tablet TAKE 1 TABLET EVERY DAY 30 tablet 6  . gabapentin (NEURONTIN) 400 MG capsule Take 1 capsule (400 mg total) by mouth 3 (three) times daily. 90 capsule 5  . HYDROcodone-acetaminophen (NORCO) 10-325 MG per tablet Take 1 tablet by mouth 3 (three) times daily as needed.    . meloxicam (MOBIC) 15 MG tablet Take 15 mg by mouth as needed.     . nitroGLYCERIN (NITROSTAT) 0.4 MG SL tablet Place 1 tablet under the tongue as needed.    Marland Kitchen omeprazole (PRILOSEC) 40 MG capsule Take 1 capsule (40 mg total) by mouth daily. 30 capsule 5  . PARoxetine (PAXIL) 40 MG tablet Take 1 tablet (40 mg total) by mouth every morning. 30 tablet 5  . ranitidine (ZANTAC) 150 MG tablet Take 1 tablet (150 mg total) by mouth daily. 30 tablet 5  . traMADol (ULTRAM) 50 MG tablet Take 1 tablet (50 mg total) by mouth every 6 (six) hours as needed. 60 tablet 2  . ondansetron (ZOFRAN) 4 MG tablet Take  1 tablet (4 mg total) by mouth every 8 (eight) hours as needed for nausea or vomiting. (Patient not taking: Reported on 09/25/2015) 20 tablet 0   No current facility-administered medications for this visit.    Allergies as of 09/25/2015 - Review Complete 09/25/2015  Allergen Reaction Noted  . Pregabalin  05/01/2015  . Shellfish allergy Other (See Comments) 09/06/2015  . Venlafaxine  05/01/2015    ROS:  General: Negative for anorexia, weight loss, fever, chills, fatigue, weakness. ENT: Negative for hoarseness, difficulty swallowing , nasal congestion. CV: Negative for chest pain, angina, palpitations, dyspnea on exertion, peripheral edema.  Respiratory: Negative for dyspnea at rest, dyspnea on exertion, cough, sputum, wheezing.  GI: See history of present illness. GU:  Negative for dysuria, hematuria, urinary incontinence, urinary frequency, nocturnal urination.  Endo: Negative for unusual weight change.    Physical Examination:   BP 106/65 mmHg  Pulse 90  Temp(Src) 97.8 F (36.6 C) (Oral)  Ht 5\' 1"  (1.549 m)  Wt 165 lb 12.8 oz (75.206 kg)  BMI 31.34 kg/m2  General: Well-nourished, well-developed in no acute distress.  Eyes: No icterus. Conjunctivae pink. Mouth: Oropharyngeal mucosa moist and pink , no lesions erythema or exudate. Lungs: Clear to auscultation bilaterally. Non-labored. Heart: Regular rate and rhythm, no murmurs rubs or gallops.  Abdomen: Bowel sounds are normal,  nontender, nondistended, no hepatosplenomegaly or masses, no abdominal bruits or hernia , no rebound or guarding.   Extremities: No lower extremity edema. No clubbing or deformities. Neuro: Alert and oriented x 3.  Grossly intact. Skin: Warm and dry, no jaundice.   Psych: Alert and cooperative, normal mood and affect.  Labs:    Imaging Studies: Ct Abdomen Pelvis W Contrast  09/06/2015  CLINICAL DATA:  54 year old female with abdominal and pelvic pain, nausea vomiting and rectal bleeding for 1 day.  History of breast cancer. EXAM: CT ABDOMEN AND PELVIS WITH CONTRAST TECHNIQUE: Multidetector CT imaging of the abdomen and pelvis was performed using the standard protocol following bolus administration of intravenous contrast. CONTRAST:  121mL OMNIPAQUE IOHEXOL 300 MG/ML  SOLN COMPARISON:  08/16/2015 PET-CT and prior exams FINDINGS: Lower chest:  Minimal left basilar scarring identified. Hepatobiliary: The liver and gallbladder are unremarkable. There is no evidence biliary dilatation. Pancreas: Unremarkable Spleen: Unremarkable Adrenals/Urinary Tract: The kidneys, adrenal glands and bladder are unremarkable. Stomach/Bowel: Diffuse circumferential wall thickening of the descending and sigmoid colon noted with adjacent inflammation compatible with colitis. There is no evidence of bowel obstruction. The appendix is normal. Vascular/Lymphatic: No enlarged lymph nodes. The vascular structures are unremarkable without evidence of aneurysm. The visualized celiac artery, SMA, IMA and SMV are patent. Reproductive: The uterus and adnexal regions are within normal limits. Other: A trace amount of pelvic fluid is noted. There is no evidence of abscess or pneumoperitoneum. Musculoskeletal: No acute or suspicious abnormalities identified. Remote left-sided rib fractures are identified. IMPRESSION: Descending and sigmoid colitis -likely inflammatory or infectious. No evidence of pneumoperitoneum, abscess or bowel obstruction. Electronically Signed   By: Margarette Canada M.D.   On: 09/06/2015 17:40    Assessment and Plan:   Deanna Baker is a 54 y.o. y/o female who had an abnormal CT scan showing thickening of the esophagus. The patient will be set up for an upper endoscopy. Her lower G.I. symptoms have completely resolved without any further bleeding or diarrhea.   Note: This dictation was prepared with Dragon dictation along with smaller phrase technology. Any transcriptional errors that result from this process are  unintentional.

## 2015-09-26 ENCOUNTER — Other Ambulatory Visit: Payer: Self-pay

## 2015-10-02 ENCOUNTER — Encounter: Payer: Self-pay | Admitting: Family Medicine

## 2015-10-02 ENCOUNTER — Other Ambulatory Visit: Payer: Self-pay | Admitting: Family Medicine

## 2015-10-02 DIAGNOSIS — M5441 Lumbago with sciatica, right side: Secondary | ICD-10-CM

## 2015-10-02 DIAGNOSIS — Z91018 Allergy to other foods: Secondary | ICD-10-CM

## 2015-10-02 MED ORDER — ONDANSETRON HCL 4 MG PO TABS: 4.0000 mg | ORAL_TABLET | Freq: Three times a day (TID) | ORAL | Status: DC | PRN

## 2015-10-04 ENCOUNTER — Ambulatory Visit: Payer: BLUE CROSS/BLUE SHIELD | Admitting: Gastroenterology

## 2015-10-05 ENCOUNTER — Encounter: Payer: Self-pay | Admitting: Radiation Oncology

## 2015-10-05 ENCOUNTER — Ambulatory Visit
Admission: RE | Admit: 2015-10-05 | Discharge: 2015-10-05 | Disposition: A | Discharge status: Home / Self Care | Payer: BLUE CROSS/BLUE SHIELD | Source: Ambulatory Visit | Attending: Radiation Oncology | Admitting: Radiation Oncology

## 2015-10-05 VITALS — BP 103/62 | HR 77 | Temp 97.3°F | Resp 18 | Ht 61.0 in | Wt 165.5 lb

## 2015-10-05 DIAGNOSIS — Z853 Personal history of malignant neoplasm of breast: Secondary | ICD-10-CM

## 2015-10-05 NOTE — Progress Notes (Signed)
Radiation Oncology Follow up Note  Name: Deanna Baker   Date:   10/05/2015 MRN:  [id number] DOB: May 02, 1961    This 54 y.o. female presents to the clinic today for follow-up for triple negative breast cancer status post chemotherapy and whole breast radiation stage IC.  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 She completed chemotherapy with Cytoxan and Adriamycin and Taxol followed by whole breast radiation.Marland Kitchen She's been having some bone pain she just generally has chronic pain syndrome repeat PET CT scan and bone scan have been performed showing no evidence to suggest distant disease. She's also developed an allergy to shellfish as well as peanut butter. She seen allergist for that. She specifically denies breast tenderness cough or bone pain. Her follow-up mammograms have been BI-RADS 2  COMPLICATIONS OF TREATMENT: none  FOLLOW UP COMPLIANCE: keeps appointments   PHYSICAL EXAM:  BP 103/62 mmHg  Pulse 77  Temp(Src) 97.3 F (36.3 C) (Tympanic)  Resp 18  Ht 5\' 1"  (1.549 m)  Wt 165 lb 7.3 oz (75.05 kg)  BMI 31.28 kg/m2 Lungs are clear to A&P cardiac examination essentially unremarkable with regular rate and rhythm. No dominant mass or nodularity is noted in either breast in 2 positions examined. Incision is well-healed. No axillary or supraclavicular adenopathy is appreciated. Cosmetic result is excellent. Well-developed well-nourished patient in NAD. HEENT reveals PERLA, EOMI, discs not visualized.  Oral cavity is clear. No oral mucosal lesions are identified. Neck is clear without evidence of cervical or supraclavicular adenopathy. Lungs are clear to A&P. Cardiac examination is essentially unremarkable with regular rate and rhythm without murmur rub or thrill. Abdomen is benign with no organomegaly or masses  noted. Motor sensory and DTR levels are equal and symmetric in the upper and lower extremities. Cranial nerves II through XII are grossly intact. Proprioception is intact. No peripheral adenopathy or edema is identified. No motor or sensory levels are noted. Crude visual fields are within normal range.  RADIOLOGY RESULTS: Recent mammogram is reviewed compatible above-stated findings  PLAN: At the present time she continues to do well with no evidence of disease. I am please were overall progress. I've asked to see her back in 1 year for follow-up. She is not on aromatase inhibitor therapy based on the triple negative nature of her disease. She also will see an allergist about recent shellfish and peanut butter allergies. Patient knows to call with any concerns.  I would like to take this opportunity for allowing me to participate in the care of your patient.Armstead Peaks., MD

## 2015-10-23 ENCOUNTER — Ambulatory Visit: Payer: BLUE CROSS/BLUE SHIELD | Admitting: Anesthesiology

## 2015-10-23 ENCOUNTER — Ambulatory Visit
Admission: RE | Admit: 2015-10-23 | Discharge: 2015-10-23 | Disposition: A | Discharge status: Home / Self Care | Payer: BLUE CROSS/BLUE SHIELD | Source: Ambulatory Visit | Attending: Gastroenterology | Admitting: Gastroenterology

## 2015-10-23 ENCOUNTER — Encounter
Admission: RE | Disposition: A | Discharge status: Home / Self Care | Payer: Self-pay | Source: Ambulatory Visit | Attending: Gastroenterology

## 2015-10-23 DIAGNOSIS — Z853 Personal history of malignant neoplasm of breast: Secondary | ICD-10-CM | POA: Insufficient documentation

## 2015-10-23 DIAGNOSIS — Z79899 Other long term (current) drug therapy: Secondary | ICD-10-CM | POA: Diagnosis not present

## 2015-10-23 DIAGNOSIS — R1310 Dysphagia, unspecified: Secondary | ICD-10-CM | POA: Diagnosis not present

## 2015-10-23 DIAGNOSIS — M1990 Unspecified osteoarthritis, unspecified site: Secondary | ICD-10-CM | POA: Diagnosis not present

## 2015-10-23 DIAGNOSIS — K222 Esophageal obstruction: Secondary | ICD-10-CM | POA: Diagnosis not present

## 2015-10-23 DIAGNOSIS — R5382 Chronic fatigue, unspecified: Secondary | ICD-10-CM | POA: Insufficient documentation

## 2015-10-23 DIAGNOSIS — Z87891 Personal history of nicotine dependence: Secondary | ICD-10-CM | POA: Insufficient documentation

## 2015-10-23 DIAGNOSIS — Z791 Long term (current) use of non-steroidal anti-inflammatories (NSAID): Secondary | ICD-10-CM | POA: Insufficient documentation

## 2015-10-23 DIAGNOSIS — Z79891 Long term (current) use of opiate analgesic: Secondary | ICD-10-CM | POA: Diagnosis not present

## 2015-10-23 DIAGNOSIS — Z86010 Personal history of colonic polyps: Secondary | ICD-10-CM | POA: Diagnosis not present

## 2015-10-23 DIAGNOSIS — Z9851 Tubal ligation status: Secondary | ICD-10-CM | POA: Insufficient documentation

## 2015-10-23 DIAGNOSIS — K2100 Gastro-esophageal reflux disease with esophagitis, without bleeding: Secondary | ICD-10-CM | POA: Insufficient documentation

## 2015-10-23 DIAGNOSIS — I10 Essential (primary) hypertension: Secondary | ICD-10-CM | POA: Insufficient documentation

## 2015-10-23 DIAGNOSIS — R198 Other specified symptoms and signs involving the digestive system and abdomen: Secondary | ICD-10-CM | POA: Insufficient documentation

## 2015-10-23 DIAGNOSIS — R011 Cardiac murmur, unspecified: Secondary | ICD-10-CM | POA: Diagnosis not present

## 2015-10-23 DIAGNOSIS — R933 Abnormal findings on diagnostic imaging of other parts of digestive tract: Secondary | ICD-10-CM | POA: Diagnosis present

## 2015-10-23 DIAGNOSIS — Z8619 Personal history of other infectious and parasitic diseases: Secondary | ICD-10-CM | POA: Insufficient documentation

## 2015-10-23 DIAGNOSIS — M797 Fibromyalgia: Secondary | ICD-10-CM | POA: Insufficient documentation

## 2015-10-23 DIAGNOSIS — Z9889 Other specified postprocedural states: Secondary | ICD-10-CM | POA: Diagnosis not present

## 2015-10-23 HISTORY — PX: ESOPHAGOGASTRODUODENOSCOPY (EGD) WITH PROPOFOL: SHX5813

## 2015-10-23 SURGERY — ESOPHAGOGASTRODUODENOSCOPY (EGD) WITH PROPOFOL
Anesthesia: General

## 2015-10-23 MED ORDER — SODIUM CHLORIDE 0.9 % IV SOLN
INTRAVENOUS | Status: DC
  Administered 2015-10-23: 1000 mL via INTRAVENOUS

## 2015-10-23 MED ORDER — LIDOCAINE HCL (CARDIAC) 20 MG/ML IV SOLN
INTRAVENOUS | Status: DC | PRN
  Administered 2015-10-23: 80 mg via INTRAVENOUS

## 2015-10-23 MED ORDER — MIDAZOLAM HCL 2 MG/2ML IJ SOLN
INTRAMUSCULAR | Status: DC | PRN
  Administered 2015-10-23: 2 mg via INTRAVENOUS

## 2015-10-23 MED ORDER — PROPOFOL 500 MG/50ML IV EMUL
INTRAVENOUS | Status: DC | PRN
Start: 2015-10-23 — End: 2015-10-23
  Administered 2015-10-23: 160 ug/kg/min via INTRAVENOUS

## 2015-10-23 NOTE — Transfer of Care (Signed)
Immediate Anesthesia Transfer of Care Note  Patient: Deanna Baker  Procedure(s) Performed: Procedure(s): ESOPHAGOGASTRODUODENOSCOPY (EGD) WITH PROPOFOL (N/A)  Patient Location: PACU and Endoscopy Unit  Anesthesia Type:General  Level of Consciousness: awake, alert  and oriented  Airway & Oxygen Therapy: Patient Spontanous Breathing and Patient connected to nasal cannula oxygen  Post-op Assessment: Report given to RN and Post -op Vital signs reviewed and stable  Post vital signs: Reviewed and stable  Last Vitals: 10:14- 88 hr 16 resp 122/78 97%  Filed Vitals:   10/23/15 0917  BP: 115/73  Pulse: 93  Temp: 36.1 C  Resp: 20    Complications: No apparent anesthesia complications

## 2015-10-23 NOTE — Anesthesia Preprocedure Evaluation (Signed)
Anesthesia Evaluation  Patient identified by MRN, date of birth, ID band Patient awake    Reviewed: Allergy & Precautions, H&P , NPO status , Patient's Chart, lab work & pertinent test results, reviewed documented beta blocker date and time   Airway Mallampati: II  TM Distance: >3 FB Neck ROM: full    Dental no notable dental hx.    Pulmonary neg pulmonary ROS, former smoker,    Pulmonary exam normal breath sounds clear to auscultation       Cardiovascular Exercise Tolerance: Good hypertension, negative cardio ROS   Rhythm:regular Rate:Normal     Neuro/Psych  Headaches, PSYCHIATRIC DISORDERS  Neuromuscular disease negative neurological ROS  negative psych ROS   GI/Hepatic negative GI ROS, Neg liver ROS, GERD  ,  Endo/Other  negative endocrine ROS  Renal/GU negative Renal ROS  negative genitourinary   Musculoskeletal   Abdominal   Peds  Hematology negative hematology ROS (+)   Anesthesia Other Findings   Reproductive/Obstetrics negative OB ROS                             Anesthesia Physical Anesthesia Plan  ASA: III  Anesthesia Plan: General   Post-op Pain Management:    Induction:   Airway Management Planned:   Additional Equipment:   Intra-op Plan:   Post-operative Plan:   Informed Consent: I have reviewed the patients History and Physical, chart, labs and discussed the procedure including the risks, benefits and alternatives for the proposed anesthesia with the patient or authorized representative who has indicated his/her understanding and acceptance.   Dental Advisory Given  Plan Discussed with: CRNA  Anesthesia Plan Comments:         Anesthesia Quick Evaluation

## 2015-10-23 NOTE — Anesthesia Postprocedure Evaluation (Signed)
Anesthesia Post Note  Patient: Deanna Baker  Procedure(s) Performed: Procedure(s) (LRB): ESOPHAGOGASTRODUODENOSCOPY (EGD) WITH PROPOFOL (N/A)  Patient location during evaluation: PACU Anesthesia Type: General Level of consciousness: awake and alert Pain management: pain level controlled Vital Signs Assessment: post-procedure vital signs reviewed and stable Respiratory status: spontaneous breathing, nonlabored ventilation, respiratory function stable and patient connected to nasal cannula oxygen Cardiovascular status: blood pressure returned to baseline and stable Postop Assessment: no signs of nausea or vomiting Anesthetic complications: no    Last Vitals:  Filed Vitals:   10/23/15 0917 10/23/15 1013  BP: 115/73 122/78  Pulse: 93 92  Temp: 36.1 C 36.3 C  Resp: 20 16    Last Pain: There were no vitals filed for this visit.               Molli Barrows

## 2015-10-23 NOTE — H&P (Signed)
[facility]  [address] [place], [place] [address] Phone: [phone] Fax : [phone]  Primary Care Physician:  Loistine Chance, MD Primary Gastroenterologist:  Dr. Allen Norris  Pre-Procedure History & Physical: HPI:  Deanna Baker is a 54 y.o. female is here for an endoscopy.   Past Medical History  Diagnosis Date  . Fibromyalgia 1993  . Arthritis 1990  . Hypertension 2012  . Heart murmur   . Measles 1960  . Lump or mass in breast   . Mumps 1970  . Chronic fatigue syndrome   . Colon polyp 2013  . Malignant neoplasm of upper-outer quadrant of female breast ([place])     Invasive mammary carcinoma, triple negative    Past Surgical History  Procedure Laterality Date  . Upper gi endoscopy      Dr. Suzette Battiest  . Shoulder surgery Left 2009  . Colonoscopy  2013    Dr. Suzette Battiest   . Tubal ligation  1982  . Tonsillectomy  1965  . Breast lumpectomy Left 12/06/12     lumpectomy with SN biopsy and power port placement...  . Back surgery      Prior to Admission medications   Medication Sig Start Date End Date Taking? Authorizing Provider  ALPRAZolam Duanne Moron) 0.5 MG tablet Take 1 tablet (0.5 mg total) by mouth 2 (two) times daily as needed for anxiety. 08/03/15   Steele Sizer, MD  amLODipine-benazepril (LOTREL) 5-20 MG capsule TAKE ONE CAPSULE BY MOUTH EVERY EVENING FOR BLOOD PRESSURE 09/12/15   Steele Sizer, MD  buPROPion (WELLBUTRIN XL) 300 MG 24 hr tablet TAKE 1 TABLET EVERY DAY 08/16/15   Steele Sizer, MD  gabapentin (NEURONTIN) 400 MG capsule Take 1 capsule (400 mg total) by mouth 3 (three) times daily. 08/03/15   Steele Sizer, MD  HYDROcodone-acetaminophen (NORCO) 10-325 MG per tablet Take 1 tablet by mouth 3 (three) times daily as needed.    Historical Provider, MD  meloxicam (MOBIC) 15 MG tablet Take 15 mg by mouth as needed.     Historical Provider, MD  nitroGLYCERIN (NITROSTAT) 0.4 MG SL tablet Place 1 tablet under the tongue as needed. 05/11/15 05/10/16   Historical Provider, MD  omeprazole (PRILOSEC) 40 MG capsule Take 1 capsule (40 mg total) by mouth daily. 05/01/15   Steele Sizer, MD  ondansetron (ZOFRAN) 4 MG tablet Take 1 tablet (4 mg total) by mouth every 8 (eight) hours as needed for nausea or vomiting. 10/02/15   Steele Sizer, MD  PARoxetine (PAXIL) 40 MG tablet Take 1 tablet (40 mg total) by mouth every morning. 08/03/15   Steele Sizer, MD  ranitidine (ZANTAC) 150 MG tablet Take 1 tablet (150 mg total) by mouth daily. 05/01/15   Steele Sizer, MD  traMADol (ULTRAM) 50 MG tablet Take 1 tablet (50 mg total) by mouth every 6 (six) hours as needed. 08/03/15   Steele Sizer, MD    Allergies as of 09/26/2015 - Review Complete 09/25/2015  Allergen Reaction Noted  . Pregabalin  05/01/2015  . Shellfish allergy Other (See Comments) 09/06/2015  . Venlafaxine  05/01/2015    Family History  Problem Relation Age of Onset  . Breast cancer Mother     Social History   Social History  . Marital Status: Married    Spouse Name: N/A  . Number of Children: N/A  . Years of Education: N/A   Occupational History  . Not on file.   Social History Main Topics  . Smoking status: Former Smoker -- 1.00  packs/day for 20 years    Types: Cigarettes    Quit date: 11/24/1980  . Smokeless tobacco: Never Used  . Alcohol Use: 0.0 oz/week    0 Standard drinks or equivalent per week     Comment: rarely  . Drug Use: No  . Sexual Activity:    Partners: Male   Other Topics Concern  . Not on file   Social History Narrative    Review of Systems: See HPI, otherwise negative ROS  Physical Exam: BP 115/73 mmHg  Pulse 93  Temp(Src) 96.9 F (36.1 C) (Tympanic)  Resp 20  SpO2 99% General:   Alert,  pleasant and cooperative in NAD Head:  Normocephalic and atraumatic. Neck:  Supple; no masses or thyromegaly. Lungs:  Clear throughout to auscultation.    Heart:  Regular rate and rhythm. Abdomen:  Soft, nontender and nondistended. Normal bowel sounds,  without guarding, and without rebound.   Neurologic:  Alert and  oriented x4;  grossly normal neurologically.  Impression/Plan: Deanna Baker is here for an endoscopy to be performed for Abnormal CT and dysphagia.  Risks, benefits, limitations, and alternatives regarding  endoscopy have been reviewed with the patient.  Questions have been answered.  All parties agreeable.   Ollen Bowl, MD  10/23/2015, 10:11 AM

## 2015-10-23 NOTE — Op Note (Addendum)
[facility] Patient Name: Deanna Baker Procedure Date: 10/23/2015 9:57 AM MRN: [id number] Account #: [id number] Date of Birth: 1960-12-09 Admit Type: Outpatient Age: 54 Room: [facility] ENDO ROOM 4 Gender: Female Note Status: Supervisor Override Procedure:            Upper GI endoscopy Indications:          Dysphagia, Abnormal CT of the GI tract Providers:            Lucilla Lame, MD Referring MD:         Bethena Roys. Sowles, MD (Referring MD) Medicines:            Propofol per Anesthesia Complications:        No immediate complications. Procedure:            Pre-Anesthesia Assessment:                       - Prior to the procedure, a History and Physical was                        performed, and patient medications and allergies were                        reviewed. The patient's tolerance of previous                        anesthesia was also reviewed. The risks and benefits of                        the procedure and the sedation options and risks were                        discussed with the patient. All questions were                        answered, and informed consent was obtained. Prior                        Anticoagulants: The patient has taken no previous                        anticoagulant or antiplatelet agents. ASA Grade                        Assessment: II - A patient with mild systemic disease.                        After reviewing the risks and benefits, the patient was                        deemed in satisfactory condition to undergo the                        procedure.                       After obtaining informed consent, the endoscope was                        passed under direct vision. Throughout the procedure,  the patient's blood pressure, pulse, and oxygen                        saturations were monitored continuously. The Olympus                        GIF-160 endoscope (S#. [id number]) was introduced  through                        the mouth, and advanced to the second part of duodenum.                        The upper GI endoscopy was accomplished without                        difficulty. The patient tolerated the procedure well. Findings:      One moderate benign-appearing, intrinsic stenosis was found at the       gastroesophageal junction. And was traversed. A TTS dilator was passed       through the scope. Dilation with a 15-16.5-18 mm balloon (to a maximum       balloon size of 18 mm) dilator was performed. The dilation site was       examined following endoscope reinsertion and showed complete resolution       of luminal narrowing.      LA Grade B (one or more mucosal breaks greater than 5 mm, not extending       between the tops of two mucosal folds) esophagitis with no bleeding was       found in the lower third of the esophagus.      The stomach was normal.      The examined duodenum was normal. Impression:           - Benign-appearing esophageal stenosis. Dilated.                       - LA Grade B reflux esophagitis.                       - Normal stomach.                       - Normal examined duodenum.                       - No specimens collected. Recommendation:       - Repeat the upper endoscopy PRN for retreatment.                       - Use a proton pump inhibitor PO daily. Procedure Code(s):    --- Professional ---                       [phone], Esophagogastroduodenoscopy, flexible, transoral;                        with transendoscopic balloon dilation of esophagus                        (less than 30 mm diameter) Diagnosis Code(s):    --- Professional ---  K21.0, Gastro-esophageal reflux disease with esophagitis                       R13.10, Dysphagia, unspecified                       K22.2, Esophageal obstruction                       R93.3, Abnormal findings on diagnostic imaging of other                        parts of digestive  tract CPT copyright 2016 [employer]. All rights reserved. The codes documented in this report are preliminary and upon coder review may  be revised to meet current compliance requirements. Lucilla Lame, MD 10/23/2015 10:10:34 AM This report has been signed electronically. Number of Addenda: 0 Note Initiated On: 10/23/2015 9:57 AM      [facility]

## 2015-10-24 ENCOUNTER — Encounter: Payer: Self-pay | Admitting: Gastroenterology

## 2015-10-25 ENCOUNTER — Other Ambulatory Visit: Payer: Self-pay | Admitting: Family Medicine

## 2015-10-25 ENCOUNTER — Encounter: Payer: Self-pay | Admitting: Family Medicine

## 2015-10-25 DIAGNOSIS — G4731 Primary central sleep apnea: Secondary | ICD-10-CM

## 2015-11-02 ENCOUNTER — Ambulatory Visit (INDEPENDENT_AMBULATORY_CARE_PROVIDER_SITE_OTHER): Payer: BLUE CROSS/BLUE SHIELD | Admitting: Family Medicine

## 2015-11-02 ENCOUNTER — Encounter: Payer: Self-pay | Admitting: Family Medicine

## 2015-11-02 VITALS — BP 112/62 | HR 102 | Temp 97.9°F | Resp 16 | Ht 61.0 in | Wt 164.8 lb

## 2015-11-02 DIAGNOSIS — D649 Anemia, unspecified: Secondary | ICD-10-CM | POA: Diagnosis not present

## 2015-11-02 DIAGNOSIS — R5382 Chronic fatigue, unspecified: Secondary | ICD-10-CM | POA: Diagnosis not present

## 2015-11-02 DIAGNOSIS — K2100 Gastro-esophageal reflux disease with esophagitis, without bleeding: Secondary | ICD-10-CM

## 2015-11-02 DIAGNOSIS — I10 Essential (primary) hypertension: Secondary | ICD-10-CM

## 2015-11-02 DIAGNOSIS — R933 Abnormal findings on diagnostic imaging of other parts of digestive tract: Secondary | ICD-10-CM | POA: Diagnosis not present

## 2015-11-02 DIAGNOSIS — F339 Major depressive disorder, recurrent, unspecified: Secondary | ICD-10-CM | POA: Diagnosis not present

## 2015-11-02 DIAGNOSIS — M797 Fibromyalgia: Secondary | ICD-10-CM | POA: Diagnosis not present

## 2015-11-02 DIAGNOSIS — G9332 Myalgic encephalomyelitis/chronic fatigue syndrome: Secondary | ICD-10-CM

## 2015-11-02 DIAGNOSIS — R198 Other specified symptoms and signs involving the digestive system and abdomen: Secondary | ICD-10-CM

## 2015-11-02 DIAGNOSIS — G4730 Sleep apnea, unspecified: Secondary | ICD-10-CM

## 2015-11-02 DIAGNOSIS — K219 Gastro-esophageal reflux disease without esophagitis: Secondary | ICD-10-CM | POA: Diagnosis not present

## 2015-11-02 DIAGNOSIS — D8989 Other specified disorders involving the immune mechanism, not elsewhere classified: Secondary | ICD-10-CM

## 2015-11-02 MED ORDER — HYDROCODONE-ACETAMINOPHEN 10-325 MG PO TABS: 1.0000 | ORAL_TABLET | Freq: Three times a day (TID) | ORAL | Status: DC | PRN

## 2015-11-02 MED ORDER — TRAMADOL HCL 50 MG PO TABS: 50.0000 mg | ORAL_TABLET | Freq: Four times a day (QID) | ORAL | Status: DC | PRN

## 2015-11-02 MED ORDER — OMEPRAZOLE 40 MG PO CPDR: 40.0000 mg | DELAYED_RELEASE_CAPSULE | Freq: Two times a day (BID) | ORAL | Status: DC

## 2015-11-02 NOTE — Progress Notes (Signed)
Name: Deanna Baker   MRN: [id number]    DOB: March 08, 1961   Date:11/02/2015       Progress Note  Subjective  Chief Complaint  Chief Complaint  Patient presents with  . Medication Refill    follow-up  . Fibromyalgia  . Pain  . Depression    unchanged  . Hypertension    HPI    HTN: taking medication and denies side effects, bp is at goal, she has chest pain very seldom.  Seen by cardiologist and normal work up. Occasionally SOB with activity.   FMS: she continues to have daily pain, taking pain medications : duloxetine, gabapentin, and antidepressant. She has daily mental fogginess, trigger point pains. She is compliant with medication, taking pain medication and Neurontin. Hydrocodone only when in severe pain.   CFIDS: still feels tired all the time, taking 2 naps daily and is able to sleep all night. Post-activity fatigue is severe. Off stimulants, but does not want to go back on medication   Reflux esophagitis:  choking and also esophageal thickenes on CT chest/abdomen. Seen by Dr. Durwin Reges she had an EGD, and was diagnosed with reflux esophagitis and advised to increase PPI to twice daily. She has noticed an improvement of her symptoms since she increased dose of PPI. She still has regurgitation    Major Depression:  still feels tired - she also has chronic fatigue syndrome taking medication as prescribed. Occasionally has down days usually related to FMS and CFIDS.   Possible Food allergy: went to [facility] after eating shrimp and diagnosed with colitis, and also a few weeks later after eating peanut butter. She had a negative work up so far. No recent episodes.     Patient Active Problem List   Diagnosis Date Noted  . Reflux esophagitis   . Problems with swallowing and mastication   . Stricture and stenosis of esophagus   . Abnormal findings-gastrointestinal tract   . Thickening of esophagus 08/03/2015  . Choking 07/04/2015  . Benign essential HTN 05/01/2015  . CFIDS (chronic  fatigue and immune dysfunction syndrome) 05/01/2015  . Major depression, recurrent, chronic ([place]) 05/01/2015  . Dyslipidemia 05/01/2015  . Fibromyalgia syndrome 05/01/2015  . Blood glucose elevated 05/01/2015  . Eczema intertrigo 05/01/2015  . Chronic migraine without aura without status migrainosus, not intractable 05/01/2015  . Excessive urination at night 05/01/2015  . Dysmetabolic syndrome 56/31/4970  . History of colonoscopy with polypectomy 05/01/2015  . Central sleep apnea 05/01/2015  . History of back surgery 05/01/2015  . Thickened nails 05/01/2015  . Malignant neoplasm of upper-outer quadrant of female breast, triple negative 06/13/2013    Past Surgical History  Procedure Laterality Date  . Upper gi endoscopy      Dr. Suzette Battiest  . Shoulder surgery Left 2009  . Colonoscopy  2013    Dr. Suzette Battiest   . Tubal ligation  1982  . Tonsillectomy  1965  . Breast lumpectomy Left 12/06/12     lumpectomy with SN biopsy and power port placement...  . Back surgery    . Esophagogastroduodenoscopy (egd) with propofol N/A 10/23/2015    Procedure: ESOPHAGOGASTRODUODENOSCOPY (EGD) WITH PROPOFOL;  Surgeon: Lucilla Lame, MD;  Location: [facility] ENDOSCOPY;  Service: Endoscopy;  Laterality: N/A;    Family History  Problem Relation Age of Onset  . Breast cancer Mother     Social History   Social History  . Marital Status: Married    Spouse Name: N/A  . Number of Children: N/A  . Years  of Education: N/A   Occupational History  . Not on file.   Social History Main Topics  . Smoking status: Former Smoker -- 1.00 packs/day for 20 years    Types: Cigarettes    Quit date: 11/24/1980  . Smokeless tobacco: Never Used  . Alcohol Use: 0.0 oz/week    0 Standard drinks or equivalent per week     Comment: rarely  . Drug Use: No  . Sexual Activity:    Partners: Male   Other Topics Concern  . Not on file   Social History Narrative     Current outpatient prescriptions:  .  ALPRAZolam  (XANAX) 0.5 MG tablet, Take 1 tablet (0.5 mg total) by mouth 2 (two) times daily as needed for anxiety., Disp: 30 tablet, Rfl: 1 .  amLODipine-benazepril (LOTREL) 5-20 MG capsule, TAKE ONE CAPSULE BY MOUTH EVERY EVENING FOR BLOOD PRESSURE, Disp: 30 capsule, Rfl: 6 .  buPROPion (WELLBUTRIN XL) 300 MG 24 hr tablet, TAKE 1 TABLET EVERY DAY, Disp: 30 tablet, Rfl: 6 .  EPIPEN 2-PAK 0.3 MG/0.3ML SOAJ injection, USE AS DIRECTED AS NEEDED MUST LAST 30 DAYS, Disp: , Rfl: 1 .  gabapentin (NEURONTIN) 400 MG capsule, Take 1 capsule (400 mg total) by mouth 3 (three) times daily., Disp: 90 capsule, Rfl: 5 .  HYDROcodone-acetaminophen (NORCO) 10-325 MG tablet, Take 1 tablet by mouth 3 (three) times daily as needed., Disp: 30 tablet, Rfl: 0 .  nitroGLYCERIN (NITROSTAT) 0.4 MG SL tablet, Place 1 tablet under the tongue as needed., Disp: , Rfl:  .  omeprazole (PRILOSEC) 40 MG capsule, Take 1 capsule (40 mg total) by mouth 2 (two) times daily., Disp: 60 capsule, Rfl: 2 .  ondansetron (ZOFRAN) 4 MG tablet, Take 1 tablet (4 mg total) by mouth every 8 (eight) hours as needed for nausea or vomiting., Disp: 20 tablet, Rfl: 0 .  PARoxetine (PAXIL) 40 MG tablet, Take 1 tablet (40 mg total) by mouth every morning., Disp: 30 tablet, Rfl: 5 .  traMADol (ULTRAM) 50 MG tablet, Take 1 tablet (50 mg total) by mouth every 6 (six) hours as needed., Disp: 60 tablet, Rfl: 2  Allergies  Allergen Reactions  . Pregabalin     weight gain and blurred vision  . Shellfish Allergy Other (See Comments)    Reports possible episode of lip swelling after having shrimp.  . Venlafaxine     foggy minded     ROS  Constitutional: Negative for fever or weight change.  Respiratory: Positive  for cough - when she has regurgitation  , occasional shortness of breath.   Cardiovascular: Intermittent chest pain no palpitations.  Gastrointestinal: Negative for abdominal pain, no bowel changes.  Musculoskeletal: Negative for gait problem or joint  swelling.  Skin: Negative for rash.  Neurological: Negative for dizziness or headache.  No other specific complaints in a complete review of systems (except as listed in HPI above).  Objective  Filed Vitals:   11/02/15 1222  BP: 112/62  Pulse: 102  Temp: 97.9 F (36.6 C)  TempSrc: Oral  Resp: 16  Height: _0  (1.549 m)  Weight: 164 lb 12.8 oz (74.753 kg)  SpO2: 96%    Body mass index is 31.15 kg/(m^2).  Physical Exam  Constitutional: Patient appears well-developed and well-nourished. Obese  No distress.  HEENT: head atraumatic, normocephalic, pupils equal and reactive to light, neck supple, throat within normal limits Cardiovascular: Normal rate, regular rhythm and normal heart sounds.  No murmur heard. No BLE edema. Pulmonary/Chest: Effort  normal and breath sounds normal. No respiratory distress. Abdominal: Soft.  There is no tenderness. Psychiatric: Patient has a normal mood and affect. behavior is normal. Judgment and thought content normal. Muscular Skeletal: positive trigger points throughout.   Recent Results (from the past 2160 hour(s))  Glucose, capillary     Status: Abnormal   Collection Time: 08/16/15  7:25 AM  Result Value Ref Range   Glucose-Capillary 124 (H) 65 - 99 mg/dL  Comprehensive metabolic panel     Status: Abnormal   Collection Time: 09/06/15  2:35 PM  Result Value Ref Range   Sodium 137 135 - 145 mmol/L   Potassium 3.7 3.5 - 5.1 mmol/L   Chloride 103 101 - 111 mmol/L   CO2 28 22 - 32 mmol/L   Glucose, Bld 117 (H) 65 - 99 mg/dL   BUN 15 6 - 20 mg/dL   Creatinine, Ser 0.96 0.44 - 1.00 mg/dL   Calcium 8.9 8.9 - 10.3 mg/dL   Total Protein 6.3 (L) 6.5 - 8.1 g/dL   Albumin 3.5 3.5 - 5.0 g/dL   AST 24 15 - 41 U/L   ALT 18 14 - 54 U/L   Alkaline Phosphatase 124 38 - 126 U/L   Total Bilirubin 0.7 0.3 - 1.2 mg/dL   GFR calc non Af Amer >60 >60 mL/min   GFR calc Af Amer >60 >60 mL/min    Comment: (NOTE) The eGFR has been calculated using the CKD  EPI equation. This calculation has not been validated in all clinical situations. eGFR's persistently <60 mL/min signify possible Chronic Kidney Disease.    Anion gap 6 5 - 15  CBC     Status: Abnormal   Collection Time: 09/06/15  2:35 PM  Result Value Ref Range   WBC 10.4 3.6 - 11.0 K/uL   RBC 3.99 3.80 - 5.20 MIL/uL   Hemoglobin 11.5 (L) 12.0 - 16.0 g/dL   HCT 35.1 35.0 - 47.0 %   MCV 88.1 80.0 - 100.0 fL   MCH 28.9 26.0 - 34.0 pg   MCHC 32.8 32.0 - 36.0 g/dL   RDW 15.2 (H) 11.5 - 14.5 %   Platelets 212 150 - 440 K/uL  Type and screen [facility]     Status: None   Collection Time: 09/06/15  2:35 PM  Result Value Ref Range   ABO/RH(D) A POS    Antibody Screen NEG    Sample Expiration 09/09/2015   ABO/Rh     Status: None   Collection Time: 09/06/15  2:36 PM  Result Value Ref Range   ABO/RH(D) A POS   Hemoglobin     Status: Abnormal   Collection Time: 09/07/15 12:02 AM  Result Value Ref Range   Hemoglobin 10.2 (L) 12.0 - 16.0 g/dL  CBC     Status: Abnormal   Collection Time: 09/07/15  5:00 AM  Result Value Ref Range   WBC 10.1 3.6 - 11.0 K/uL   RBC 3.73 (L) 3.80 - 5.20 MIL/uL   Hemoglobin 11.1 (L) 12.0 - 16.0 g/dL   HCT 33.3 (L) 35.0 - 47.0 %   MCV 89.2 80.0 - 100.0 fL   MCH 29.9 26.0 - 34.0 pg   MCHC 33.4 32.0 - 36.0 g/dL   RDW 15.1 (H) 11.5 - 14.5 %   Platelets 168 150 - 440 K/uL  Basic metabolic panel     Status: Abnormal   Collection Time: 09/07/15  5:00 AM  Result Value Ref Range  Sodium 141 135 - 145 mmol/L   Potassium 3.5 3.5 - 5.1 mmol/L   Chloride 109 101 - 111 mmol/L   CO2 27 22 - 32 mmol/L   Glucose, Bld 125 (H) 65 - 99 mg/dL   BUN 8 6 - 20 mg/dL   Creatinine, Ser 0.68 0.44 - 1.00 mg/dL   Calcium 8.6 (L) 8.9 - 10.3 mg/dL   GFR calc non Af Amer >60 >60 mL/min   GFR calc Af Amer >60 >60 mL/min    Comment: (NOTE) The eGFR has been calculated using the CKD EPI equation. This calculation has not been validated in all clinical  situations. eGFR's persistently <60 mL/min signify possible Chronic Kidney Disease.    Anion gap 5 5 - 15  CBC with Differential     Status: Abnormal   Collection Time: 09/18/15 11:33 AM  Result Value Ref Range   WBC 6.7 3.6 - 11.0 K/uL   RBC 4.16 3.80 - 5.20 MIL/uL   Hemoglobin 11.7 (L) 12.0 - 16.0 g/dL   HCT 36.1 35.0 - 47.0 %   MCV 86.6 80.0 - 100.0 fL   MCH 28.1 26.0 - 34.0 pg   MCHC 32.5 32.0 - 36.0 g/dL   RDW 14.9 (H) 11.5 - 14.5 %   Platelets 319 150 - 440 K/uL   Neutrophils Relative % 80 %   Neutro Abs 5.3 1.4 - 6.5 K/uL   Lymphocytes Relative 13 %   Lymphs Abs 0.9 (L) 1.0 - 3.6 K/uL   Monocytes Relative 5 %   Monocytes Absolute 0.3 0.2 - 0.9 K/uL   Eosinophils Relative 2 %   Eosinophils Absolute 0.1 0 - 0.7 K/uL   Basophils Relative 0 %   Basophils Absolute 0.0 0 - 0.1 K/uL  Comprehensive metabolic panel     Status: Abnormal   Collection Time: 09/18/15 11:33 AM  Result Value Ref Range   Sodium 134 (L) 135 - 145 mmol/L   Potassium 3.9 3.5 - 5.1 mmol/L   Chloride 104 101 - 111 mmol/L   CO2 25 22 - 32 mmol/L   Glucose, Bld 115 (H) 65 - 99 mg/dL   BUN 13 6 - 20 mg/dL   Creatinine, Ser 0.93 0.44 - 1.00 mg/dL   Calcium 8.5 (L) 8.9 - 10.3 mg/dL   Total Protein 6.8 6.5 - 8.1 g/dL   Albumin 3.8 3.5 - 5.0 g/dL   AST 34 15 - 41 U/L   ALT 19 14 - 54 U/L   Alkaline Phosphatase 102 38 - 126 U/L   Total Bilirubin 0.4 0.3 - 1.2 mg/dL   GFR calc non Af Amer >60 >60 mL/min   GFR calc Af Amer >60 >60 mL/min    Comment: (NOTE) The eGFR has been calculated using the CKD EPI equation. This calculation has not been validated in all clinical situations. eGFR's persistently <60 mL/min signify possible Chronic Kidney Disease.    Anion gap 5 5 - 15  Calcium, ionized     Status: None   Collection Time: 09/20/15  5:00 PM  Result Value Ref Range   Calcium, Ion 5.1 4.5 - 5.6 mg/dL     PHQ2/9: Depression screen [facility] 2/9 09/20/2015 05/01/2015  Decreased Interest 0 1  Down,  Depressed, Hopeless 0 1  PHQ - 2 Score 0 2  Altered sleeping - 0  Tired, decreased energy - 2  Change in appetite - 1  Feeling bad or failure about yourself  - 1  Trouble concentrating - 3  Moving slowly or fidgety/restless - 2  Suicidal thoughts - 1  PHQ-9 Score - 12  Difficult doing work/chores - Very difficult     Fall Risk: Fall Risk  09/20/2015 05/01/2015  Falls in the past year? Yes No  Number falls in past yr: 1 -  Injury with Fall? No -     Functional Status Survey: Is the patient deaf or have difficulty hearing?: No Does the patient have difficulty seeing, even when wearing glasses/contacts?: Yes (rading glasses) Does the patient have difficulty concentrating, remembering, or making decisions?: No Does the patient have difficulty walking or climbing stairs?: No Does the patient have difficulty dressing or bathing?: No Does the patient have difficulty doing errands alone such as visiting a doctor's office or shopping?: No    Assessment & Plan  1. Reflux esophagitis   on higher of Omeprazole twice daily and doing better - omeprazole (PRILOSEC) 40 MG capsule; Take 1 capsule (40 mg total) by mouth 2 (two) times daily.  Dispense: 60 capsule; Refill: 2  2. Benign essential HTN  At goal   3. Fibromyalgia syndrome  - traMADol (ULTRAM) 50 MG tablet; Take 1 tablet (50 mg total) by mouth every 6 (six) hours as needed.  Dispense: 60 tablet; Refill: 2 - HYDROcodone-acetaminophen (NORCO) 10-325 MG tablet; Take 1 tablet by mouth 3 (three) times daily as needed.  Dispense: 30 tablet; Refill: 0  4. Major depression, recurrent, chronic ([facility])  stable  5. Abnormal findings-gastrointestinal tract  Seen by GI, diagnosed with reflux esophagitis  6. CFIDS (chronic fatigue and immune dysfunction syndrome)  Continue current regiment  7. Sleep apnea  - Ambulatory referral to [facility] - she met her deductible and would like to have it done before the end of the  year  8. Anemia, unspecified  She wants to hold off on repeating labs, they were done during episode of colitis with rectal bleeding

## 2015-11-09 ENCOUNTER — Ambulatory Visit: Payer: BLUE CROSS/BLUE SHIELD | Attending: Neurology

## 2015-11-09 DIAGNOSIS — G4733 Obstructive sleep apnea (adult) (pediatric): Secondary | ICD-10-CM | POA: Diagnosis present

## 2015-11-09 DIAGNOSIS — G4761 Periodic limb movement disorder: Secondary | ICD-10-CM | POA: Insufficient documentation

## 2015-11-15 ENCOUNTER — Encounter: Payer: Self-pay | Admitting: Family Medicine

## 2015-11-27 DIAGNOSIS — G4733 Obstructive sleep apnea (adult) (pediatric): Secondary | ICD-10-CM

## 2015-11-27 HISTORY — DX: Obstructive sleep apnea (adult) (pediatric): G47.33

## 2015-12-14 ENCOUNTER — Encounter: Payer: Self-pay | Admitting: General Surgery

## 2015-12-17 ENCOUNTER — Other Ambulatory Visit: Payer: Self-pay | Admitting: Family Medicine

## 2015-12-18 ENCOUNTER — Other Ambulatory Visit: Payer: Self-pay | Admitting: Family Medicine

## 2015-12-18 DIAGNOSIS — M797 Fibromyalgia: Secondary | ICD-10-CM

## 2015-12-18 DIAGNOSIS — M5441 Lumbago with sciatica, right side: Secondary | ICD-10-CM

## 2015-12-18 MED ORDER — ONDANSETRON HCL 4 MG PO TABS: 4.0000 mg | ORAL_TABLET | Freq: Three times a day (TID) | ORAL | Status: DC | PRN

## 2015-12-18 MED ORDER — HYDROCODONE-ACETAMINOPHEN 10-325 MG PO TABS: 1.0000 | ORAL_TABLET | Freq: Three times a day (TID) | ORAL | Status: DC | PRN

## 2015-12-20 ENCOUNTER — Ambulatory Visit: Payer: BLUE CROSS/BLUE SHIELD | Admitting: General Surgery

## 2015-12-24 ENCOUNTER — Ambulatory Visit (INDEPENDENT_AMBULATORY_CARE_PROVIDER_SITE_OTHER): Payer: BLUE CROSS/BLUE SHIELD | Admitting: General Surgery

## 2015-12-24 ENCOUNTER — Encounter: Payer: Self-pay | Admitting: General Surgery

## 2015-12-24 VITALS — BP 140/80 | HR 76 | Resp 12 | Ht 61.0 in | Wt 175.0 lb

## 2015-12-24 DIAGNOSIS — C50412 Malignant neoplasm of upper-outer quadrant of left female breast: Secondary | ICD-10-CM

## 2015-12-24 NOTE — Patient Instructions (Signed)
Follow up in 1 year. Continue with monthly self breast exams.

## 2015-12-24 NOTE — Progress Notes (Signed)
Patient ID: Deanna Baker, female   DOB: Nov 28, 1960, 55 y.o.   MRN: [id number]  Chief Complaint  Patient presents with  . Follow-up    mammogram    HPI CHARNIQUA Baker is a 55 y.o. female who presents for a breast cancer follow-up. The most recent mammogram was done on 12/14/15.  Patient does perform regular self breast checks and gets regular mammograms done.  No new breast issues. She states she will follow up with Dr. Jeb Levering in the Fall 2017. Patient started wearing CPAP on 11/27/15 for sleep apnea. I have reviewed the history of present illness with the patient.  HPI  Past Medical History  Diagnosis Date  . Fibromyalgia 1993  . Arthritis 1990  . Hypertension 2012  . Heart murmur   . Measles   . Lump or mass in breast   . Mumps 1973  . Chronic fatigue syndrome   . Colon polyp 2013  . Malignant neoplasm of upper-outer quadrant of female breast ([place])     Invasive mammary carcinoma, triple negative  . Obstructive sleep apnea on CPAP 11/27/15    Past Surgical History  Procedure Laterality Date  . Upper gi endoscopy      Dr. Suzette Battiest  . Shoulder surgery Left 2009  . Colonoscopy  2013    Dr. Suzette Battiest   . Tubal ligation  1982  . Tonsillectomy  1965  . Breast lumpectomy Left 12/06/12     lumpectomy with SN biopsy and power port placement...  . Back surgery    . Esophagogastroduodenoscopy (egd) with propofol N/A 10/23/2015    Procedure: ESOPHAGOGASTRODUODENOSCOPY (EGD) WITH PROPOFOL;  Surgeon: Lucilla Lame, MD;  Location: [facility] ENDOSCOPY;  Service: Endoscopy;  Laterality: N/A;    Family History  Problem Relation Age of Onset  . Breast cancer Mother     Social History Social History  Substance Use Topics  . Smoking status: Former Smoker -- 1.00 packs/day for 20 years    Types: Cigarettes    Quit date: 11/24/1980  . Smokeless tobacco: Never Used  . Alcohol Use: 0.0 oz/week    0 Standard drinks or equivalent per week     Comment: rarely    Allergies  Allergen  Reactions  . Pregabalin     weight gain and blurred vision  . Shellfish Allergy Other (See Comments)    Reports possible episode of lip swelling after having shrimp.  . Venlafaxine     foggy minded    Current Outpatient Prescriptions  Medication Sig Dispense Refill  . ALPRAZolam (XANAX) 0.5 MG tablet Take 1 tablet (0.5 mg total) by mouth 2 (two) times daily as needed for anxiety. 30 tablet 1  . amLODipine-benazepril (LOTREL) 5-20 MG capsule TAKE ONE CAPSULE BY MOUTH EVERY EVENING FOR BLOOD PRESSURE 30 capsule 6  . buPROPion (WELLBUTRIN XL) 300 MG 24 hr tablet TAKE 1 TABLET EVERY DAY 30 tablet 6  . EPIPEN 2-PAK 0.3 MG/0.3ML SOAJ injection USE AS DIRECTED AS NEEDED MUST LAST 30 DAYS  1  . gabapentin (NEURONTIN) 400 MG capsule Take 1 capsule (400 mg total) by mouth 3 (three) times daily. 90 capsule 5  . HYDROcodone-acetaminophen (NORCO) 10-325 MG tablet Take 1 tablet by mouth 3 (three) times daily as needed. 30 tablet 0  . nitroGLYCERIN (NITROSTAT) 0.4 MG SL tablet Place 1 tablet under the tongue as needed.    Marland Kitchen omeprazole (PRILOSEC) 40 MG capsule Take 1 capsule (40 mg total) by mouth 2 (two) times daily. 60 capsule 2  .  ondansetron (ZOFRAN) 4 MG tablet Take 1 tablet (4 mg total) by mouth every 8 (eight) hours as needed for nausea or vomiting. 20 tablet 0  . PARoxetine (PAXIL) 40 MG tablet Take 1 tablet (40 mg total) by mouth every morning. 30 tablet 5  . traMADol (ULTRAM) 50 MG tablet Take 1 tablet (50 mg total) by mouth every 6 (six) hours as needed. 60 tablet 2   No current facility-administered medications for this visit.    Review of Systems Review of Systems  Constitutional: Negative.   Respiratory: Negative.   Cardiovascular: Negative.     Blood pressure 140/80, pulse 76, resp. rate 12, height 5\' 1"  (1.549 m), weight 175 lb (79.379 kg).  Physical Exam Physical Exam  Constitutional: She is oriented to person, place, and time. She appears well-developed and well-nourished.   Eyes: Conjunctivae are normal. No scleral icterus.  Neck: Neck supple. No thyromegaly present.  Cardiovascular: Normal rate, regular rhythm and normal heart sounds.   Pulmonary/Chest: Right breast exhibits no inverted nipple, no mass, no nipple discharge, no skin change and no tenderness. Left breast exhibits no inverted nipple, no mass, no nipple discharge, no skin change and no tenderness.  Left breast- thickening of skin from radiational changes, minimal peu d'orange, unchanged from prior visit.   Abdominal: Soft. Bowel sounds are normal. There is no hepatomegaly. There is no tenderness. No hernia.  Lymphadenopathy:    She has no cervical adenopathy.    She has no axillary adenopathy.  Neurological: She is alert and oriented to person, place, and time.  Skin: Skin is warm and dry.    Data Reviewed Old notes and labs reviewed. Mammogram reviewed and stable.   Assessment    Patient is 3 years post left breast lumpectomy with SN biopsy. Radiation and chemo for triple negative cancer. Stable exam. Patient had documentation of mild stenosis of GE junction and mild esophagitis per endoscopy by Dr.Wohl. Stenosis was dilated and she was doing better.    Plan    Patient to return in 1 year for follow up.  This information has been scribed by Verlene Mayer, CMA   PCP: Dr. Foye Deer 12/25/2015, 11:14 AM

## 2015-12-25 ENCOUNTER — Encounter: Payer: Self-pay | Admitting: General Surgery

## 2016-01-21 ENCOUNTER — Other Ambulatory Visit: Payer: Self-pay | Admitting: Family Medicine

## 2016-02-04 ENCOUNTER — Ambulatory Visit (INDEPENDENT_AMBULATORY_CARE_PROVIDER_SITE_OTHER): Payer: BLUE CROSS/BLUE SHIELD | Admitting: Family Medicine

## 2016-02-04 ENCOUNTER — Encounter: Payer: Self-pay | Admitting: Family Medicine

## 2016-02-04 VITALS — BP 124/70 | HR 81 | Temp 97.9°F | Resp 18 | Ht 61.0 in | Wt 172.3 lb

## 2016-02-04 DIAGNOSIS — I10 Essential (primary) hypertension: Secondary | ICD-10-CM

## 2016-02-04 DIAGNOSIS — F339 Major depressive disorder, recurrent, unspecified: Secondary | ICD-10-CM

## 2016-02-04 DIAGNOSIS — G4730 Sleep apnea, unspecified: Secondary | ICD-10-CM

## 2016-02-04 DIAGNOSIS — K2100 Gastro-esophageal reflux disease with esophagitis, without bleeding: Secondary | ICD-10-CM

## 2016-02-04 DIAGNOSIS — T783XXD Angioneurotic edema, subsequent encounter: Secondary | ICD-10-CM | POA: Diagnosis not present

## 2016-02-04 DIAGNOSIS — R296 Repeated falls: Secondary | ICD-10-CM

## 2016-02-04 DIAGNOSIS — M797 Fibromyalgia: Secondary | ICD-10-CM | POA: Diagnosis not present

## 2016-02-04 MED ORDER — OMEPRAZOLE 40 MG PO CPDR: 40.0000 mg | DELAYED_RELEASE_CAPSULE | Freq: Two times a day (BID) | ORAL | Status: DC

## 2016-02-04 MED ORDER — BUPROPION HCL ER (XL) 300 MG PO TB24: 300.0000 mg | ORAL_TABLET | Freq: Every day | ORAL | Status: DC

## 2016-02-04 MED ORDER — GABAPENTIN 400 MG PO CAPS: 400.0000 mg | ORAL_CAPSULE | Freq: Three times a day (TID) | ORAL | Status: DC

## 2016-02-04 MED ORDER — AMLODIPINE BESYLATE-VALSARTAN 5-320 MG PO TABS: 1.0000 | ORAL_TABLET | Freq: Every day | ORAL | Status: DC

## 2016-02-04 MED ORDER — PAROXETINE HCL 40 MG PO TABS: 40.0000 mg | ORAL_TABLET | ORAL | Status: DC

## 2016-02-04 NOTE — Progress Notes (Signed)
Name: Deanna Baker   MRN: [id number]    DOB: 1961/10/10   Date:02/04/2016       Progress Note  Subjective  Chief Complaint  Chief Complaint  Patient presents with  . Medication Refill    3 month F/U  . Fibromyalgia    Pain has been worst due to her back and neck hurting. Patient feels like it is getting more in her joints and hurting now in her bilateral wrist and ankles.   . Gastroesophageal Reflux    Helps her symptoms and wanted to talk to you about the bottle stating not to take it every day.   . Hypertension    Edema in bilateral feet for the first week in February.  . Anxiety  . Emesis    Had three episodes since last visit, 1 week October and November 2016  and February 2017, swelling in her lips after eating, edema in bilateral feets and chills.      HPI  HTN: taking Lotrel for bp is slightly up today, no longer having chest pain. Seen by cardiologist and normal work up. Occasionally SOB with activity.   FMS: she continues to have daily pain, taking pain medications :gabapentin, and antidepressant. She has daily mental fogginess, trigger point pains. She is compliant with medication, taking pain medication and Neurontin. Hydrocodone only when in severe pain.   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 Post-activity fatigue is severe. Off stimulants, but does not want to go back on medication   Reflux esophagitis: choking and also esophageal thickenes on CT chest/abdomen. Seen by Dr. Durwin Reges she had an EGD and esophageal dilation, and was diagnosed with reflux esophagitis and advised to increase PPI to twice daily. She has noticed an improvement of her symptoms since she increased dose of PPI. She still has regurgitation occasionally   Major Depression: still feels tired - she also has chronic fatigue syndrome taking medication as prescribed. Occasionally has down days usually related to FMS and CFIDS.   Recurrent episodes of  angioedema: three episodes of angioedema ( lips, feet ) with association with vomiting and diarrhea ( normal consistency ). She was evaluated by allergist and work up was negative - we will try stopping ace and see if symptoms resolves. Explained angioedema is a possible side effects of ace even years after starting the medication   Patient Active Problem List   Diagnosis Date Noted  . Reflux esophagitis   . Stricture and stenosis of esophagus   . Abnormal findings-gastrointestinal tract   . Thickening of esophagus 08/03/2015  . Choking 07/04/2015  . Benign essential HTN 05/01/2015  . CFIDS (chronic fatigue and immune dysfunction syndrome) 05/01/2015  . Major depression, recurrent, chronic ([place]) 05/01/2015  . Dyslipidemia 05/01/2015  . Fibromyalgia syndrome 05/01/2015  . Blood glucose elevated 05/01/2015  . Eczema intertrigo 05/01/2015  . Chronic migraine without aura without status migrainosus, not intractable 05/01/2015  . Excessive urination at night 05/01/2015  . Dysmetabolic syndrome [id number]  . History of colonoscopy with polypectomy 05/01/2015  . Central sleep apnea 05/01/2015  . History of back surgery 05/01/2015  . Thickened nails 05/01/2015  . Malignant neoplasm of upper-outer quadrant of female breast, triple negative 06/13/2013    Past Surgical History  Procedure Laterality Date  . Upper gi endoscopy      Dr. Suzette Battiest  . Shoulder surgery Left 2009  . Colonoscopy  2013    Dr. Suzette Battiest   .  Tubal ligation  1982  . Tonsillectomy  1965  . Breast lumpectomy Left 12/06/12     lumpectomy with SN biopsy and power port placement...  . Back surgery    . Esophagogastroduodenoscopy (egd) with propofol N/A 10/23/2015    Procedure: ESOPHAGOGASTRODUODENOSCOPY (EGD) WITH PROPOFOL;  Surgeon: Lucilla Lame, MD;  Location: [facility] ENDOSCOPY;  Service: Endoscopy;  Laterality: N/A;    Family History  Problem Relation Age of Onset  . Breast cancer Mother     Social History    Social History  . Marital Status: Married    Spouse Name: N/A  . Number of Children: N/A  . Years of Education: N/A   Occupational History  . Not on file.   Social History Main Topics  . Smoking status: Former Smoker -- 1.00 packs/day for 20 years    Types: Cigarettes    Quit date: 11/24/1980  . Smokeless tobacco: Never Used  . Alcohol Use: 0.0 oz/week    0 Standard drinks or equivalent per week     Comment: rarely  . Drug Use: No  . Sexual Activity:    Partners: Male   Other Topics Concern  . Not on file   Social History Narrative     Current outpatient prescriptions:  .  ALPRAZolam (XANAX) 0.5 MG tablet, Take 1 tablet (0.5 mg total) by mouth 2 (two) times daily as needed for anxiety., Disp: 30 tablet, Rfl: 1 .  buPROPion (WELLBUTRIN XL) 300 MG 24 hr tablet, Take 1 tablet (300 mg total) by mouth daily., Disp: 30 tablet, Rfl: 5 .  EPIPEN 2-PAK 0.3 MG/0.3ML SOAJ injection, USE AS DIRECTED AS NEEDED MUST LAST 30 DAYS, Disp: , Rfl: 1 .  gabapentin (NEURONTIN) 400 MG capsule, Take 1 capsule (400 mg total) by mouth 3 (three) times daily., Disp: 90 capsule, Rfl: 5 .  HYDROcodone-acetaminophen (NORCO) 10-325 MG tablet, Take 1 tablet by mouth 3 (three) times daily as needed., Disp: 30 tablet, Rfl: 0 .  nitroGLYCERIN (NITROSTAT) 0.4 MG SL tablet, Place 1 tablet under the tongue as needed., Disp: , Rfl:  .  omeprazole (PRILOSEC) 40 MG capsule, Take 1 capsule (40 mg total) by mouth 2 (two) times daily., Disp: 60 capsule, Rfl: 2 .  ondansetron (ZOFRAN) 4 MG tablet, TAKE 1 TABLET EVERY 8 HOURS AS NEEDED FOR NAUSEA AND VOMITING, Disp: 20 tablet, Rfl: 0 .  PARoxetine (PAXIL) 40 MG tablet, Take 1 tablet (40 mg total) by mouth every morning., Disp: 30 tablet, Rfl: 5 .  traMADol (ULTRAM) 50 MG tablet, Take 1 tablet (50 mg total) by mouth every 6 (six) hours as needed., Disp: 60 tablet, Rfl: 2 .  amLODipine-valsartan (EXFORGE) 5-320 MG tablet, Take 1 tablet by mouth daily., Disp: 30 tablet,  Rfl: 0  Allergies  Allergen Reactions  . Ace Inhibitors Swelling  . Pregabalin     weight gain and blurred vision  . Venlafaxine     foggy minded     ROS  Constitutional: Negative for fever or weight change.  Respiratory: Negative for cough and shortness of breath.   Cardiovascular: Negative for chest pain or palpitations.  Gastrointestinal: Negative for abdominal pain, no bowel changes.  Musculoskeletal: Negative for gait problem or joint swelling.  Skin: Negative for rash.  Neurological: Negative for dizziness or headache.  No other specific complaints in a complete review of systems (except as listed in HPI above).  Objective  Filed Vitals:   02/04/16 1332  BP: 146/88  Pulse: 81  Temp: 97.9 F (  36.6 C)  TempSrc: Oral  Resp: 18  Height: 5\' 1"  (1.549 m)  Weight: 172 lb 4.8 oz (78.155 kg)  SpO2: 96%    Body mass index is 32.57 kg/(m^2).  Physical Exam  Constitutional: Patient appears well-developed and well-nourished. Obese No distress.  HEENT: head atraumatic, normocephalic, pupils equal and reactive to light, neck supple, throat within normal limits Cardiovascular: Normal rate, regular rhythm and normal heart sounds. No murmur heard. No BLE edema. Pulmonary/Chest: Effort normal and breath sounds normal. No respiratory distress. Abdominal: Soft. There is no tenderness. Psychiatric: Patient has a normal mood and affect. behavior is normal. Judgment and thought content normal. Muscular Skeletal: positive trigger points throughout.    PHQ2/9: Depression screen [facility] 2/9 02/04/2016 09/20/2015 05/01/2015  Decreased Interest 0 0 1  Down, Depressed, Hopeless 0 0 1  PHQ - 2 Score 0 0 2  Altered sleeping - - 0  Tired, decreased energy - - 2  Change in appetite - - 1  Feeling bad or failure about yourself  - - 1  Trouble concentrating - - 3  Moving slowly or fidgety/restless - - 2  Suicidal thoughts - - 1  PHQ-9 Score - - 12  Difficult doing work/chores - - Very  difficult     Fall Risk: Fall Risk  02/04/2016 09/20/2015 05/01/2015  Falls in the past year? Yes Yes No  Number falls in past yr: 2 or more 1 -  Injury with Fall? No No -     Functional Status Survey: Is the patient deaf or have difficulty hearing?: No Does the patient have difficulty seeing, even when wearing glasses/contacts?: No Does the patient have difficulty concentrating, remembering, or making decisions?: No Does the patient have difficulty walking or climbing stairs?: No Does the patient have difficulty dressing or bathing?: No Does the patient have difficulty doing errands alone such as visiting a doctor's office or shopping?: No    Assessment & Plan  1. Benign essential HTN  We will stop Lotrel - because of angioedema, recheck bp in one month - amLODipine-valsartan (EXFORGE) 5-320 MG tablet; Take 1 tablet by mouth daily.  Dispense: 30 tablet; Refill: 0  2. Angioedema, subsequent encounter  Stop ace, seen by allergies, no food allergies  3. Fibromyalgia syndrome  - gabapentin (NEURONTIN) 400 MG capsule; Take 1 capsule (400 mg total) by mouth 3 (three) times daily.  Dispense: 90 capsule; Refill: 5  4. Major depression, recurrent, chronic ([facility])  - PARoxetine (PAXIL) 40 MG tablet; Take 1 tablet (40 mg total) by mouth every morning.  Dispense: 30 tablet; Refill: 5 - buPROPion (WELLBUTRIN XL) 300 MG 24 hr tablet; Take 1 tablet (300 mg total) by mouth daily.  Dispense: 30 tablet; Refill: 5  5. Sleep apnea  Continue CPAP use every night  6. Reflux esophagitis  - omeprazole (PRILOSEC) 40 MG capsule; Take 1 capsule (40 mg total) by mouth 2 (two) times daily.  Dispense: 60 capsule; Refill: 2   7. Recurrent falls  She does not want referral to PT at this time

## 2016-03-12 ENCOUNTER — Ambulatory Visit (INDEPENDENT_AMBULATORY_CARE_PROVIDER_SITE_OTHER): Payer: BLUE CROSS/BLUE SHIELD | Admitting: Family Medicine

## 2016-03-12 ENCOUNTER — Encounter: Payer: Self-pay | Admitting: Family Medicine

## 2016-03-12 VITALS — BP 122/78 | HR 86 | Temp 98.2°F | Resp 16 | Ht 61.0 in | Wt 175.1 lb

## 2016-03-12 DIAGNOSIS — M25561 Pain in right knee: Secondary | ICD-10-CM | POA: Insufficient documentation

## 2016-03-12 DIAGNOSIS — M659 Synovitis and tenosynovitis, unspecified: Secondary | ICD-10-CM | POA: Insufficient documentation

## 2016-03-12 DIAGNOSIS — T783XXD Angioneurotic edema, subsequent encounter: Secondary | ICD-10-CM | POA: Diagnosis not present

## 2016-03-12 DIAGNOSIS — M797 Fibromyalgia: Secondary | ICD-10-CM | POA: Diagnosis not present

## 2016-03-12 DIAGNOSIS — M6588 Other synovitis and tenosynovitis, other site: Secondary | ICD-10-CM | POA: Diagnosis not present

## 2016-03-12 DIAGNOSIS — M25562 Pain in left knee: Secondary | ICD-10-CM | POA: Diagnosis not present

## 2016-03-12 DIAGNOSIS — I10 Essential (primary) hypertension: Secondary | ICD-10-CM | POA: Diagnosis not present

## 2016-03-12 MED ORDER — ONDANSETRON HCL 4 MG PO TABS: ORAL_TABLET | ORAL | Status: DC

## 2016-03-12 MED ORDER — AMLODIPINE BESYLATE-VALSARTAN 5-320 MG PO TABS: 1.0000 | ORAL_TABLET | Freq: Every day | ORAL | Status: DC

## 2016-03-12 MED ORDER — HYDROCODONE-ACETAMINOPHEN 10-325 MG PO TABS: 1.0000 | ORAL_TABLET | Freq: Three times a day (TID) | ORAL | Status: DC | PRN

## 2016-03-12 MED ORDER — THUMB BRACE MISC: 1.0000 [IU] | Freq: Every day | Status: DC

## 2016-03-12 MED ORDER — STANDARD TENS DEVI: 1.0000 [IU] | Freq: Every day | Status: AC

## 2016-03-12 MED ORDER — ALPRAZOLAM 0.5 MG PO TABS: 0.5000 mg | ORAL_TABLET | Freq: Two times a day (BID) | ORAL | Status: DC | PRN

## 2016-03-12 MED ORDER — MELOXICAM 15 MG PO TABS: 15.0000 mg | ORAL_TABLET | Freq: Every day | ORAL | Status: DC

## 2016-03-12 NOTE — Addendum Note (Signed)
Addended by: Steele Sizer F on: 03/12/2016 12:19 PM   Modules accepted: Orders

## 2016-03-12 NOTE — Progress Notes (Signed)
Name: Deanna Baker   MRN: [id number]    DOB: [birth year]   Date:03/12/2016       Progress Note  Subjective  Chief Complaint  Chief Complaint  Patient presents with  . Hypertension    benign essential  . Angioedema  . thumb pain    left, patient thought it came from doing some yard work.  . Knee Pain    bilateral knee pain  . Medication Refill    HPI  HTN: we changed from Ace to ARB on her last visit to see if it was the cause of angioedema and abdominal problems. She states she has not had any angioedema since. BP is at goal, no side effects of medication. No chest pain or palpitation  Left Thumb pain: she spent a day cleaning her mother's yard about one month ago and developed left thumb pain since. No swelling or redness, she has been wearing a brace but it does not seem to be helping with symptoms - it is a carpal tunnel brace  Bilateral knee pain: her friend Fraser Din has moved in with her about one month ago, they have been looking for home and she has been walking more, she has noticed bilateral knee pain ( she states it just "hurts"), no swelling or redness. Pain is triggered by getting up from sitting position or using stairs.   FMS: she would like to have a tens unit to see if it will improve her daily pain.   Patient Active Problem List   Diagnosis Date Noted  . Tenosynovitis of thumb 03/12/2016  . Bilateral knee pain 03/12/2016  . Reflux esophagitis   . Stricture and stenosis of esophagus   . Abnormal findings-gastrointestinal tract   . Thickening of esophagus 08/03/2015  . Choking 07/04/2015  . Benign essential HTN 05/01/2015  . CFIDS (chronic fatigue and immune dysfunction syndrome) 05/01/2015  . Major depression, recurrent, chronic ([place]) 05/01/2015  . Dyslipidemia 05/01/2015  . Fibromyalgia syndrome 05/01/2015  . Blood glucose elevated 05/01/2015  . Eczema intertrigo 05/01/2015  . Chronic migraine without aura without status migrainosus, not intractable 05/01/2015   . Excessive urination at night 05/01/2015  . Dysmetabolic syndrome [id number]  . History of colonoscopy with polypectomy 05/01/2015  . Central sleep apnea 05/01/2015  . History of back surgery 05/01/2015  . Thickened nails 05/01/2015  . Malignant neoplasm of upper-outer quadrant of female breast, triple negative 06/13/2013    Past Surgical History  Procedure Laterality Date  . Upper gi endoscopy      Dr. Suzette Battiest  . Shoulder surgery Left 2009  . Colonoscopy  2013    Dr. Suzette Battiest   . Tubal ligation  1982  . Tonsillectomy  1965  . Breast lumpectomy Left 12/06/12     lumpectomy with SN biopsy and power port placement...  . Back surgery    . Esophagogastroduodenoscopy (egd) with propofol N/A 10/23/2015    Procedure: ESOPHAGOGASTRODUODENOSCOPY (EGD) WITH PROPOFOL;  Surgeon: Lucilla Lame, MD;  Location: [facility] ENDOSCOPY;  Service: Endoscopy;  Laterality: N/A;    Family History  Problem Relation Age of Onset  . Breast cancer Mother     Social History   Social History  . Marital Status: Married    Spouse Name: N/A  . Number of Children: N/A  . Years of Education: N/A   Occupational History  . Not on file.   Social History Main Topics  . Smoking status: Former Smoker -- 1.00 packs/day for 20 years    Types: Cigarettes  Quit date: 11/24/1980  . Smokeless tobacco: Never Used  . Alcohol Use: 0.0 oz/week    0 Standard drinks or equivalent per week     Comment: rarely  . Drug Use: No  . Sexual Activity:    Partners: Male   Other Topics Concern  . Not on file   Social History Narrative     Current outpatient prescriptions:  .  ALPRAZolam (XANAX) 0.5 MG tablet, Take 1 tablet (0.5 mg total) by mouth 2 (two) times daily as needed for anxiety., Disp: 30 tablet, Rfl: 1 .  amLODipine-valsartan (EXFORGE) 5-320 MG tablet, Take 1 tablet by mouth daily., Disp: 90 tablet, Rfl: 1 .  buPROPion (WELLBUTRIN XL) 300 MG 24 hr tablet, Take 1 tablet (300 mg total) by mouth daily.,  Disp: 30 tablet, Rfl: 5 .  docusate sodium (COLACE) 100 MG capsule, Take 100 mg by mouth 2 (two) times daily., Disp: , Rfl:  .  gabapentin (NEURONTIN) 400 MG capsule, Take 1 capsule (400 mg total) by mouth 3 (three) times daily., Disp: 90 capsule, Rfl: 5 .  HYDROcodone-acetaminophen (NORCO) 10-325 MG tablet, Take 1 tablet by mouth 3 (three) times daily as needed., Disp: 30 tablet, Rfl: 0 .  Multiple Vitamins-Minerals (CENTRUM SILVER ADULT 50+ PO), Take by mouth., Disp: , Rfl:  .  omeprazole (PRILOSEC) 40 MG capsule, Take 1 capsule (40 mg total) by mouth 2 (two) times daily., Disp: 60 capsule, Rfl: 2 .  ondansetron (ZOFRAN) 4 MG tablet, TAKE 1 TABLET EVERY 8 HOURS AS NEEDED FOR NAUSEA AND VOMITING, Disp: 20 tablet, Rfl: 0 .  PARoxetine (PAXIL) 40 MG tablet, Take 1 tablet (40 mg total) by mouth every morning., Disp: 30 tablet, Rfl: 5 .  traMADol (ULTRAM) 50 MG tablet, Take 1 tablet (50 mg total) by mouth every 6 (six) hours as needed., Disp: 60 tablet, Rfl: 2 .  EPIPEN 2-PAK 0.3 MG/0.3ML SOAJ injection, Reported on 03/12/2016, Disp: , Rfl: 1 .  meloxicam (MOBIC) 15 MG tablet, Take 1 tablet (15 mg total) by mouth daily., Disp: 30 tablet, Rfl: 0 .  nitroGLYCERIN (NITROSTAT) 0.4 MG SL tablet, Place 1 tablet under the tongue as needed. Reported on 03/12/2016, Disp: , Rfl:   Allergies  Allergen Reactions  . Ace Inhibitors Swelling  . Pregabalin     weight gain and blurred vision  . Venlafaxine     foggy minded     ROS  Ten systems reviewed and is negative except as mentioned in HPI  Objective  Filed Vitals:   03/12/16 1128  BP: 122/78  Pulse: 86  Temp: 98.2 F (36.8 C)  TempSrc: Oral  Resp: 16  Height: 5\' 1"  (1.549 m)  Weight: 175 lb 1.6 oz (79.425 kg)  SpO2: 96%    Body mass index is 33.1 kg/(m^2).  Physical Exam  Constitutional: Patient appears well-developed and well-nourished. Obese  No distress.  HEENT: head atraumatic, normocephalic, pupils equal and reactive to light,neck  supple, throat within normal limits Cardiovascular: Normal rate, regular rhythm and normal heart sounds.  No murmur heard. No BLE edema. Pulmonary/Chest: Effort normal and breath sounds normal. No respiratory distress. Abdominal: Soft.  There is no tenderness. Psychiatric: Patient has a normal mood and affect. behavior is normal. Judgment and thought content normal. Muscular Skeletal: pain of left thumb with flexion of thumb and palpation of radial wrist, no swelling or redness. Normal knee exam. Trigger points positive  PHQ2/9: Depression screen [facility] 2/9 03/12/2016 02/04/2016 09/20/2015 05/01/2015  Decreased Interest 0 0 0  1  Down, Depressed, Hopeless 0 0 0 1  PHQ - 2 Score 0 0 0 2  Altered sleeping - - - 0  Tired, decreased energy - - - 2  Change in appetite - - - 1  Feeling bad or failure about yourself  - - - 1  Trouble concentrating - - - 3  Moving slowly or fidgety/restless - - - 2  Suicidal thoughts - - - 1  PHQ-9 Score - - - 12  Difficult doing work/chores - - - Very difficult    Fall Risk: Fall Risk  03/12/2016 02/04/2016 09/20/2015 05/01/2015  Falls in the past year? No Yes Yes No  Number falls in past yr: - 2 or more 1 -  Injury with Fall? No No No -    Functional Status Survey: Is the patient deaf or have difficulty hearing?: No Does the patient have difficulty seeing, even when wearing glasses/contacts?: No Does the patient have difficulty concentrating, remembering, or making decisions?: No Does the patient have difficulty walking or climbing stairs?: No Does the patient have difficulty dressing or bathing?: No Does the patient have difficulty doing errands alone such as visiting a doctor's office or shopping?: No   Assessment & Plan  1. Benign essential HTN  At goal, continue medication  - amLODipine-valsartan (EXFORGE) 5-320 MG tablet; Take 1 tablet by mouth daily.  Dispense: 90 tablet; Refill: 1  2. Angioedema, subsequent encounter  resolved  3. Tenosynovitis  of thumb  - meloxicam (MOBIC) 15 MG tablet; Take 1 tablet (15 mg total) by mouth daily.  Dispense: 30 tablet; Refill: 0 -brace Call back for referral to Ortho if symptoms do not improve  4. Bilateral knee pain  - meloxicam (MOBIC) 15 MG tablet; Take 1 tablet (15 mg total) by mouth daily.  Dispense: 30 tablet; Refill: 0   5. Fibromyalgia syndrome  - Nerve Stimulator (STANDARD TENS) DEVI; 1 Units by Does not apply route daily.  Dispense: 1 Device; Refill: 0

## 2016-03-18 ENCOUNTER — Inpatient Hospital Stay: Payer: BLUE CROSS/BLUE SHIELD | Attending: Oncology

## 2016-03-18 ENCOUNTER — Encounter: Payer: Self-pay | Admitting: Oncology

## 2016-03-18 ENCOUNTER — Inpatient Hospital Stay (HOSPITAL_BASED_OUTPATIENT_CLINIC_OR_DEPARTMENT_OTHER): Payer: BLUE CROSS/BLUE SHIELD | Admitting: Oncology

## 2016-03-18 VITALS — BP 145/80 | HR 76 | Resp 18 | Wt 180.3 lb

## 2016-03-18 DIAGNOSIS — Z9221 Personal history of antineoplastic chemotherapy: Secondary | ICD-10-CM | POA: Diagnosis not present

## 2016-03-18 DIAGNOSIS — Z171 Estrogen receptor negative status [ER-]: Secondary | ICD-10-CM | POA: Insufficient documentation

## 2016-03-18 DIAGNOSIS — Z853 Personal history of malignant neoplasm of breast: Secondary | ICD-10-CM | POA: Diagnosis not present

## 2016-03-18 DIAGNOSIS — Z87891 Personal history of nicotine dependence: Secondary | ICD-10-CM | POA: Diagnosis not present

## 2016-03-18 DIAGNOSIS — M1990 Unspecified osteoarthritis, unspecified site: Secondary | ICD-10-CM | POA: Diagnosis not present

## 2016-03-18 DIAGNOSIS — Z79899 Other long term (current) drug therapy: Secondary | ICD-10-CM | POA: Insufficient documentation

## 2016-03-18 DIAGNOSIS — G4733 Obstructive sleep apnea (adult) (pediatric): Secondary | ICD-10-CM | POA: Insufficient documentation

## 2016-03-18 DIAGNOSIS — I10 Essential (primary) hypertension: Secondary | ICD-10-CM

## 2016-03-18 DIAGNOSIS — C50412 Malignant neoplasm of upper-outer quadrant of left female breast: Secondary | ICD-10-CM

## 2016-03-18 DIAGNOSIS — M797 Fibromyalgia: Secondary | ICD-10-CM

## 2016-03-18 DIAGNOSIS — D509 Iron deficiency anemia, unspecified: Secondary | ICD-10-CM | POA: Insufficient documentation

## 2016-03-18 LAB — CBC WITH DIFFERENTIAL/PLATELET
BASOS ABS: 0 10*3/uL (ref 0–0.1)
Basophils Relative: 1 %
EOS PCT: 3 %
Eosinophils Absolute: 0.1 10*3/uL (ref 0–0.7)
HEMATOCRIT: 30.4 % — AB (ref 35.0–47.0)
Hemoglobin: 10 g/dL — ABNORMAL LOW (ref 12.0–16.0)
LYMPHS ABS: 1 10*3/uL (ref 1.0–3.6)
LYMPHS PCT: 18 %
MCH: 25.9 pg — AB (ref 26.0–34.0)
MCHC: 32.8 g/dL (ref 32.0–36.0)
MCV: 78.8 fL — AB (ref 80.0–100.0)
MONO ABS: 0.4 10*3/uL (ref 0.2–0.9)
MONOS PCT: 7 %
NEUTROS ABS: 4 10*3/uL (ref 1.4–6.5)
Neutrophils Relative %: 71 %
PLATELETS: 199 10*3/uL (ref 150–440)
RBC: 3.85 MIL/uL (ref 3.80–5.20)
RDW: 19.3 % — AB (ref 11.5–14.5)
WBC: 5.5 10*3/uL (ref 3.6–11.0)

## 2016-03-18 LAB — COMPREHENSIVE METABOLIC PANEL
ALBUMIN: 4.1 g/dL (ref 3.5–5.0)
ALT: 18 U/L (ref 14–54)
AST: 25 U/L (ref 15–41)
Alkaline Phosphatase: 101 U/L (ref 38–126)
Anion gap: 3 — ABNORMAL LOW (ref 5–15)
BUN: 19 mg/dL (ref 6–20)
CHLORIDE: 106 mmol/L (ref 101–111)
CO2: 28 mmol/L (ref 22–32)
CREATININE: 1.03 mg/dL — AB (ref 0.44–1.00)
Calcium: 9.2 mg/dL (ref 8.9–10.3)
GFR calc Af Amer: >60 mL/min (ref >60)
GLUCOSE: 102 mg/dL — AB (ref 65–99)
POTASSIUM: 4.6 mmol/L (ref 3.5–5.1)
Sodium: 137 mmol/L (ref 135–145)
TOTAL PROTEIN: 6.7 g/dL (ref 6.5–8.1)
Total Bilirubin: 0.4 mg/dL (ref 0.3–1.2)

## 2016-03-18 MED ORDER — FERROUS GLUCONATE 324 (38 FE) MG PO TABS: 324.0000 mg | ORAL_TABLET | Freq: Every day | ORAL | Status: DC

## 2016-03-18 NOTE — Progress Notes (Signed)
[place] @ [facility] [phone]  Fax:([phone]) [place]: 1961/11/24  MR#: [id number]  XLK#:[id number]  Patient Care Team: Steele Sizer, MD as PCP - General (Family Medicine) Seeplaputhur Robinette Haines, MD (General Surgery) Steele Sizer, MD as Attending Physician (Family Medicine)  CHIEF COMPLAINT:  Chief Complaint  Patient presents with  . Breast Cancer   Oncology history   1. Carcinoma of breast (left breast biopsy) November 15, 2012. upper and outer quadrant 2. Patient underwent lumpectomyand sentinel lymph node examination.  pT1C  pN0 sen M0 stage IC, triple negative  disease.   3. Started on Cytoxan and Adriamycin from December 17, 2011.   received 4 cycles of Cytoxan and Adriamycin, February 17, 2013. 4. Patient started weekly Taxol chemotherapy from March 10, 2013. 5. finished with Taxol on June 02, 2013 6local radiation therapy 7.BRCA mutation has been reported   it be negative.  (Was done in January of 2014)      Malignant neoplasm of upper-outer quadrant of female breast, triple negative   06/13/2013 Initial Diagnosis Malignant neoplasm of upper-outer quadrant of female breast, triple negative    Oncology Flowsheet 09/06/2015 09/06/2015  ALPRAZolam (XANAX) PO 0.5 mg    ondansetron (ZOFRAN) IV 4 mg 4 mg    INTERVAL HISTORY:  55 year old lady with a history of left rib cage pain.  Patient has a stage 1C node negative, triple negative disease.  Patient started having left rib cage pain several weeks ago.  Cannot lie down any comfortable position.  Patient had been evaluated by primary care physician and Dr. Jamal Collin has  ordeed  CT scan.  No evidence of abnormality was found.  Patient does have fibromyalgia.  No other significant bone rinse.  Pain related to the left chest for area has improved Bone scan and a PET scan is now being reviewed independently.    Patient is getting mammograms done ordered by surgeon.  No other significant  complaints REVIEW OF SYSTEMS:   GENERAL:  Feels good.  Active.  No fevers, sweats or weight loss. PERFORMANCE STATUS (ECOG): 0 HEENT:  No visual changes, runny nose, sore throat, mouth sores or tenderness. Lungs: No shortness of breath or cough.  No hemoptysis. Cardiac:  No chest pain, palpitations, orthopnea, or PND. GI:  No nausea, vomiting, diarrhea, constipation, melena or hematochezia. GU:  No urgency, frequency, dysuria, or hematuria. Musculoskeletal: Significant improvement in bony pains Extremities:  No pain or swelling. Skin:  No rashes or skin changes. Neuro:  No headache, numbness or weakness, balance or coordination issues. Endocrine:  No diabetes, thyroid issues, hot flashes or night sweats. Psych:  No mood changes, depression or anxiety. Pain:  No focal pain. Review of systems:  All other systems reviewed and found to be negative. PAST MEDICAL HISTORY: Past Medical History  Diagnosis Date  . Fibromyalgia 1993  . Arthritis 1990  . Hypertension 2012  . Heart murmur   . Measles   . Lump or mass in breast   . Mumps 1973  . Chronic fatigue syndrome   . Colon polyp 2013  . Malignant neoplasm of upper-outer quadrant of female breast ([place])     Invasive mammary carcinoma, triple negative  . Obstructive sleep apnea on CPAP 11/27/15    PAST SURGICAL HISTORY: Past Surgical History  Procedure Laterality Date  . Upper gi endoscopy      Dr. Suzette Battiest  . Shoulder surgery Left 2009  . Colonoscopy  2013    Dr. Suzette Battiest   .  Tubal ligation  1982  . Tonsillectomy  1965  . Breast lumpectomy Left 12/06/12     lumpectomy with SN biopsy and power port placement...  . Back surgery    . Esophagogastroduodenoscopy (egd) with propofol N/A 10/23/2015    Procedure: ESOPHAGOGASTRODUODENOSCOPY (EGD) WITH PROPOFOL;  Surgeon: Lucilla Lame, MD;  Location: [facility] ENDOSCOPY;  Service: Endoscopy;  Laterality: N/A;    FAMILY HISTORY Family History  Problem Relation Age of Onset  . Breast  cancer Mother     ADVANCED DIRECTIVES:  No flowsheet data found.  HEALTH MAINTENANCE: Social History  Substance Use Topics  . Smoking status: Former Smoker -- 1.00 packs/day for 20 years    Types: Cigarettes    Quit date: 11/24/1980  . Smokeless tobacco: Never Used  . Alcohol Use: 0.0 oz/week    0 Standard drinks or equivalent per week     Comment: rarely      Allergies  Allergen Reactions  . Ace Inhibitors Swelling  . Pregabalin     weight gain and blurred vision  . Venlafaxine     foggy minded    Current Outpatient Prescriptions  Medication Sig Dispense Refill  . ALPRAZolam (XANAX) 0.5 MG tablet Take 1 tablet (0.5 mg total) by mouth 2 (two) times daily as needed for anxiety. 30 tablet 0  . amLODipine-valsartan (EXFORGE) 5-320 MG tablet Take 1 tablet by mouth daily. 90 tablet 1  . buPROPion (WELLBUTRIN XL) 300 MG 24 hr tablet Take 1 tablet (300 mg total) by mouth daily. 30 tablet 5  . docusate sodium (COLACE) 100 MG capsule Take 100 mg by mouth 2 (two) times daily.    . Elastic Bandages & Supports (THUMB BRACE) MISC 1 Units by Does not apply route daily. 1 each 0  . EPIPEN 2-PAK 0.3 MG/0.3ML SOAJ injection Reported on 03/12/2016  1  . gabapentin (NEURONTIN) 400 MG capsule Take 1 capsule (400 mg total) by mouth 3 (three) times daily. 90 capsule 5  . HYDROcodone-acetaminophen (NORCO) 10-325 MG tablet Take 1 tablet by mouth 3 (three) times daily as needed. 30 tablet 0  . meloxicam (MOBIC) 15 MG tablet Take 1 tablet (15 mg total) by mouth daily. 30 tablet 0  . Multiple Vitamins-Minerals (CENTRUM SILVER ADULT 50+ PO) Take by mouth.    . Nerve Stimulator (STANDARD TENS) DEVI 1 Units by Does not apply route daily. 1 Device 0  . nitroGLYCERIN (NITROSTAT) 0.4 MG SL tablet Place 1 tablet under the tongue as needed. Reported on 03/12/2016    . omeprazole (PRILOSEC) 40 MG capsule Take 1 capsule (40 mg total) by mouth 2 (two) times daily. 60 capsule 2  . ondansetron (ZOFRAN) 4 MG tablet  TAKE 1 TABLET EVERY 8 HOURS AS NEEDED FOR NAUSEA AND VOMITING 20 tablet 0  . PARoxetine (PAXIL) 40 MG tablet Take 1 tablet (40 mg total) by mouth every morning. 30 tablet 5  . traMADol (ULTRAM) 50 MG tablet Take 1 tablet (50 mg total) by mouth every 6 (six) hours as needed. 60 tablet 2   No current facility-administered medications for this visit.    OBJECTIVE:  Filed Vitals:   03/18/16 1050  BP: 145/80  Pulse: 76  Resp: 18     Body mass index is 34.09 kg/(m^2).    ECOG FS:0 - Asymptomatic  PHYSICAL EXAM: Goal status: Performance status is good.  Patient has not lost significant weight HEENT: No evidence of stomatitis. Sclera and conjunctivae :: No jaundice.   pale looking. Lungs: Air  entry equal on both sides.  No rhonchi.  No rales.  Cardiac: Heart sounds are normal.  No pericardial rub.  No murmur. Lymphatic system: Cervical, axillary, inguinal, lymph nodes not palpable GI: Abdomen is soft.  No ascites.  Liver spleen not palpable.  No tenderness.  Bowel sounds are within normal limit Lower extremity: No edema Neurological system: Higher functions, cranial nerves intact no evidence of peripheral neuropathy. Skin: No rash.  No ecchymosis.. Examination of both breast within normal limit Examination of musculoskeletal system no abnormality    LAB RESULTS:  CBC Latest Ref Rng 03/18/2016 09/18/2015  WBC 3.6 - 11.0 K/uL 5.5 6.7  Hemoglobin 12.0 - 16.0 g/dL 10.0(L) 11.7(L)  Hematocrit 35.0 - 47.0 % 30.4(L) 36.1  Platelets 150 - 440 K/uL 199 319    Appointment on 03/18/2016  Component Date Value Ref Range Status  . WBC 03/18/2016 5.5  3.6 - 11.0 K/uL Final  . RBC 03/18/2016 3.85  3.80 - 5.20 MIL/uL Final  . Hemoglobin 03/18/2016 10.0* 12.0 - 16.0 g/dL Final  . HCT 19/39/6657 30.4* 35.0 - 47.0 % Final  . MCV 03/18/2016 78.8* 80.0 - 100.0 fL Final  . MCH 03/18/2016 25.9* 26.0 - 34.0 pg Final  . MCHC 03/18/2016 32.8  32.0 - 36.0 g/dL Final  . RDW 81/40/2592 19.3* 11.5 - 14.5 %  Final  . Platelets 03/18/2016 199  150 - 440 K/uL Final  . Neutrophils Relative % 03/18/2016 71   Final  . Neutro Abs 03/18/2016 4.0  1.4 - 6.5 K/uL Final  . Lymphocytes Relative 03/18/2016 18   Final  . Lymphs Abs 03/18/2016 1.0  1.0 - 3.6 K/uL Final  . Monocytes Relative 03/18/2016 7   Final  . Monocytes Absolute 03/18/2016 0.4  0.2 - 0.9 K/uL Final  . Eosinophils Relative 03/18/2016 3   Final  . Eosinophils Absolute 03/18/2016 0.1  0 - 0.7 K/uL Final  . Basophils Relative 03/18/2016 1   Final  . Basophils Absolute 03/18/2016 0.0  0 - 0.1 K/uL Final  . Sodium 03/18/2016 137  135 - 145 mmol/L Final  . Potassium 03/18/2016 4.6  3.5 - 5.1 mmol/L Final  . Chloride 03/18/2016 106  101 - 111 mmol/L Final  . CO2 03/18/2016 28  22 - 32 mmol/L Final  . Glucose, Bld 03/18/2016 102* 65 - 99 mg/dL Final  . BUN 70/17/8433 19  6 - 20 mg/dL Final  . Creatinine, Ser 03/18/2016 1.03* 0.44 - 1.00 mg/dL Final  . Calcium 32/74/5793 9.2  8.9 - 10.3 mg/dL Final  . Total Protein 03/18/2016 6.7  6.5 - 8.1 g/dL Final  . Albumin 96/50/5257 4.1  3.5 - 5.0 g/dL Final  . AST 98/87/6781 25  15 - 41 U/L Final  . ALT 03/18/2016 18  14 - 54 U/L Final  . Alkaline Phosphatase 03/18/2016 101  38 - 126 U/L Final  . Total Bilirubin 03/18/2016 0.4  0.3 - 1.2 mg/dL Final  . GFR calc non Af Amer 03/18/2016 >60  >60 mL/min Final  . GFR calc Af Amer 03/18/2016 >60  >60 mL/min Final   Comment: (NOTE) The eGFR has been calculated using the CKD EPI equation. This calculation has not been validated in all clinical situations. eGFR's persistently <60 mL/min signify possible Chronic Kidney Disease.   . Anion gap 03/18/2016 3* 5 - 15 Final       STUDIES: No results found.  ASSESSMENT: Triple negative carcinoma breast On clinical examination there is no evidence of recurrent or  progressive disease.  All lab data is reviewed.  Mammogram was done in January of 2017 at outside institution and was followed by  Dr.Sankar 2.  History of fibromyalgia with bone pain with negative PET scan and bone scan.  pain  has improved.Marland Kitchen 3.anemia Appears to be iron deficiency. She had upper endoscopy done recently(November, 2016)no evidence of upper GI bleeding was found  Reviewing records Patient last colonoscopy was in 2013  We look into possibility of starting patient on iron therapy as well as reviewing colonoscopy report and assessing need for another colonoscopy. MEDICAL DECISION MAKING:   Stop the changes in the bone scan and PET scans for traumatic in nature which is now gradually healing Continue follow-up without any intervention  No matching staging information was found for the patient.  Forest Gleason, MD   03/18/2016 11:15 AM

## 2016-03-24 ENCOUNTER — Encounter: Payer: Self-pay | Admitting: Oncology

## 2016-03-24 DIAGNOSIS — J40 Bronchitis, not specified as acute or chronic: Secondary | ICD-10-CM

## 2016-03-24 HISTORY — DX: Bronchitis, not specified as acute or chronic: J40

## 2016-03-26 ENCOUNTER — Telehealth: Payer: Self-pay | Admitting: *Deleted

## 2016-03-26 NOTE — Telephone Encounter (Signed)
Patient contacted today and she states that she is already scheduled for an appointment with Dr. Allen Norris to discuss a colonoscopy.   Dr. Jamal Collin notified and will get in touch with Dr. Oliva Bustard regarding this.

## 2016-03-26 NOTE — Telephone Encounter (Signed)
-----   Message from Christene Lye, MD sent at 03/26/2016  9:28 AM EDT ----- Dr. Oliva Bustard requested I see her, may need colonoscopy.  Please schedule an appointment. Also get copy of her last colonoscopy report-Dr. Karel Jarvis in 2013

## 2016-04-03 ENCOUNTER — Encounter: Payer: Self-pay | Admitting: Family Medicine

## 2016-04-07 ENCOUNTER — Ambulatory Visit (INDEPENDENT_AMBULATORY_CARE_PROVIDER_SITE_OTHER): Payer: BLUE CROSS/BLUE SHIELD | Admitting: Family Medicine

## 2016-04-07 ENCOUNTER — Encounter: Payer: Self-pay | Admitting: Family Medicine

## 2016-04-07 VITALS — BP 138/82 | HR 83 | Temp 98.1°F | Resp 16 | Wt 175.3 lb

## 2016-04-07 DIAGNOSIS — J40 Bronchitis, not specified as acute or chronic: Secondary | ICD-10-CM

## 2016-04-07 DIAGNOSIS — R634 Abnormal weight loss: Secondary | ICD-10-CM | POA: Diagnosis not present

## 2016-04-07 MED ORDER — AZITHROMYCIN 250 MG PO TABS: ORAL_TABLET | ORAL | Status: DC

## 2016-04-07 MED ORDER — PREDNISONE 20 MG PO TABS: 20.0000 mg | ORAL_TABLET | Freq: Two times a day (BID) | ORAL | Status: DC

## 2016-04-07 NOTE — Progress Notes (Signed)
Name: Deanna Baker   MRN: [id number]    DOB: 1961/02/20   Date:04/07/2016       Progress Note  Subjective  Chief Complaint  Chief Complaint  Patient presents with  . Bronchitis    patient presents with dry hacky cough for greater than 2 weeks. patient has not improved while taking otc mucinex so she stopped then went to [place].   . Ear Fullness    HPI  URI/Bronchitis: she states symptoms started about 2 weeks. She went to the beach and noticed nasal congestion and rhinorrhea, followed by wheezing and a dry cough. She has noticed that she can't taste anything and has lost weight. She has also noticed some ear fullness. OTC medication does not seem to be helping.    Patient Active Problem List   Diagnosis Date Noted  . Tenosynovitis of thumb 03/12/2016  . Bilateral knee pain 03/12/2016  . Reflux esophagitis   . Stricture and stenosis of esophagus   . Abnormal findings-gastrointestinal tract   . Thickening of esophagus 08/03/2015  . Choking 07/04/2015  . Benign essential HTN 05/01/2015  . CFIDS (chronic fatigue and immune dysfunction syndrome) 05/01/2015  . Major depression, recurrent, chronic ([place]) 05/01/2015  . Dyslipidemia 05/01/2015  . Fibromyalgia syndrome 05/01/2015  . Blood glucose elevated 05/01/2015  . Eczema intertrigo 05/01/2015  . Chronic migraine without aura without status migrainosus, not intractable 05/01/2015  . Excessive urination at night 05/01/2015  . Dysmetabolic syndrome 09/09/5101  . History of colonoscopy with polypectomy 05/01/2015  . Central sleep apnea 05/01/2015  . History of back surgery 05/01/2015  . Thickened nails 05/01/2015  . Malignant neoplasm of upper-outer quadrant of female breast, triple negative 06/13/2013    Past Surgical History  Procedure Laterality Date  . Upper gi endoscopy      Dr. Suzette Battiest  . Shoulder surgery Left 2009  . Colonoscopy  2013    Dr. Suzette Battiest   . Tubal ligation  1982  . Tonsillectomy  1965  . Breast  lumpectomy Left 12/06/12     lumpectomy with SN biopsy and power port placement...  . Back surgery    . Esophagogastroduodenoscopy (egd) with propofol N/A 10/23/2015    Procedure: ESOPHAGOGASTRODUODENOSCOPY (EGD) WITH PROPOFOL;  Surgeon: Lucilla Lame, MD;  Location: [facility];  Service: Endoscopy;  Laterality: N/A;    Family History  Problem Relation Age of Onset  . Breast cancer Mother     Social History   Social History  . Marital Status: Married    Spouse Name: N/A  . Number of Children: N/A  . Years of Education: N/A   Occupational History  . Not on file.   Social History Main Topics  . Smoking status: Former Smoker -- 1.00 packs/day for 20 years    Types: Cigarettes    Quit date: 11/24/1980  . Smokeless tobacco: Never Used  . Alcohol Use: 0.0 oz/week    0 Standard drinks or equivalent per week     Comment: rarely  . Drug Use: No  . Sexual Activity:    Partners: Male   Other Topics Concern  . Not on file   Social History Narrative     Current outpatient prescriptions:  .  ALPRAZolam (XANAX) 0.5 MG tablet, Take 1 tablet (0.5 mg total) by mouth 2 (two) times daily as needed for anxiety., Disp: 30 tablet, Rfl: 0 .  amLODipine-valsartan (EXFORGE) 5-320 MG tablet, Take 1 tablet by mouth daily., Disp: 90 tablet, Rfl: 1 .  buPROPion (WELLBUTRIN XL)  300 MG 24 hr tablet, Take 1 tablet (300 mg total) by mouth daily., Disp: 30 tablet, Rfl: 5 .  docusate sodium (COLACE) 100 MG capsule, Take 100 mg by mouth 2 (two) times daily., Disp: , Rfl:  .  Elastic Bandages & Supports (THUMB BRACE) MISC, 1 Units by Does not apply route daily., Disp: 1 each, Rfl: 0 .  EPIPEN 2-PAK 0.3 MG/0.3ML SOAJ injection, Reported on 03/12/2016, Disp: , Rfl: 1 .  ferrous gluconate (FERGON) 324 MG tablet, Take 1 tablet (324 mg total) by mouth daily with breakfast., Disp: 30 tablet, Rfl: 3 .  gabapentin (NEURONTIN) 400 MG capsule, Take 1 capsule (400 mg total) by mouth 3 (three) times daily., Disp: 90  capsule, Rfl: 5 .  HYDROcodone-acetaminophen (NORCO) 10-325 MG tablet, Take 1 tablet by mouth 3 (three) times daily as needed., Disp: 30 tablet, Rfl: 0 .  meloxicam (MOBIC) 15 MG tablet, Take 1 tablet (15 mg total) by mouth daily., Disp: 30 tablet, Rfl: 0 .  Multiple Vitamins-Minerals (CENTRUM SILVER ADULT 50+ PO), Take by mouth., Disp: , Rfl:  .  Nerve Stimulator (STANDARD TENS) DEVI, 1 Units by Does not apply route daily., Disp: 1 Device, Rfl: 0 .  nitroGLYCERIN (NITROSTAT) 0.4 MG SL tablet, Place 1 tablet under the tongue as needed. Reported on 03/12/2016, Disp: , Rfl:  .  omeprazole (PRILOSEC) 40 MG capsule, Take 1 capsule (40 mg total) by mouth 2 (two) times daily., Disp: 60 capsule, Rfl: 2 .  ondansetron (ZOFRAN) 4 MG tablet, TAKE 1 TABLET EVERY 8 HOURS AS NEEDED FOR NAUSEA AND VOMITING, Disp: 20 tablet, Rfl: 0 .  PARoxetine (PAXIL) 40 MG tablet, Take 1 tablet (40 mg total) by mouth every morning., Disp: 30 tablet, Rfl: 5 .  traMADol (ULTRAM) 50 MG tablet, Take 1 tablet (50 mg total) by mouth every 6 (six) hours as needed., Disp: 60 tablet, Rfl: 2  Allergies  Allergen Reactions  . Ace Inhibitors Swelling  . Pregabalin     weight gain and blurred vision  . Venlafaxine     foggy minded     ROS  Ten systems reviewed and is negative except as mentioned in HPI and also always feels tired and has pain from FMS  Objective  Filed Vitals:   04/07/16 1414  BP: 138/82  Pulse: 83  Temp: 98.1 F (36.7 C)  TempSrc: Oral  Resp: 16  Weight: 175 lb 4.8 oz (79.516 kg)  SpO2: 99%    Body mass index is 33.14 kg/(m^2).  Physical Exam  Constitutional: Patient appears well-developed and well-nourished. Obese  No distress.  HEENT: head atraumatic, normocephalic, pupils equal and reactive to light, ears normal TM bilaterally, neck supple, throat within normal limits Cardiovascular: Normal rate, regular rhythm and normal heart sounds.  No murmur heard. No BLE edema. Pulmonary/Chest: Effort  normal , some rhonchi bilaterally. No respiratory distress. Abdominal: Soft.  There is no tenderness. Psychiatric: Patient has a normal mood and affect. behavior is normal. Judgment and thought content normal.  Recent Results (from the past 2160 hour(s))  CBC with Differential     Status: Abnormal   Collection Time: 03/18/16 10:00 AM  Result Value Ref Range   WBC 5.5 3.6 - 11.0 K/uL   RBC 3.85 3.80 - 5.20 MIL/uL   Hemoglobin 10.0 (L) 12.0 - 16.0 g/dL   HCT 30.4 (L) 35.0 - 47.0 %   MCV 78.8 (L) 80.0 - 100.0 fL   MCH 25.9 (L) 26.0 - 34.0 pg   MCHC  32.8 32.0 - 36.0 g/dL   RDW 19.3 (H) 11.5 - 14.5 %   Platelets 199 150 - 440 K/uL   Neutrophils Relative % 71 %   Neutro Abs 4.0 1.4 - 6.5 K/uL   Lymphocytes Relative 18 %   Lymphs Abs 1.0 1.0 - 3.6 K/uL   Monocytes Relative 7 %   Monocytes Absolute 0.4 0.2 - 0.9 K/uL   Eosinophils Relative 3 %   Eosinophils Absolute 0.1 0 - 0.7 K/uL   Basophils Relative 1 %   Basophils Absolute 0.0 0 - 0.1 K/uL  Comprehensive metabolic panel     Status: Abnormal   Collection Time: 03/18/16 10:00 AM  Result Value Ref Range   Sodium 137 135 - 145 mmol/L   Potassium 4.6 3.5 - 5.1 mmol/L   Chloride 106 101 - 111 mmol/L   CO2 28 22 - 32 mmol/L   Glucose, Bld 102 (H) 65 - 99 mg/dL   BUN 19 6 - 20 mg/dL   Creatinine, Ser 1.03 (H) 0.44 - 1.00 mg/dL   Calcium 9.2 8.9 - 10.3 mg/dL   Total Protein 6.7 6.5 - 8.1 g/dL   Albumin 4.1 3.5 - 5.0 g/dL   AST 25 15 - 41 U/L   ALT 18 14 - 54 U/L   Alkaline Phosphatase 101 38 - 126 U/L   Total Bilirubin 0.4 0.3 - 1.2 mg/dL   GFR calc non Af Amer >60 >60 mL/min   GFR calc Af Amer >60 >60 mL/min    Comment: (NOTE) The eGFR has been calculated using the CKD EPI equation. This calculation has not been validated in all clinical situations. eGFR's persistently <60 mL/min signify possible Chronic Kidney Disease.    Anion gap 3 (L) 5 - 15      PHQ2/9: Depression screen [facility] 2/9 04/07/2016 03/12/2016 02/04/2016  09/20/2015 05/01/2015  Decreased Interest 0 0 0 0 1  Down, Depressed, Hopeless 0 0 0 0 1  PHQ - 2 Score 0 0 0 0 2  Altered sleeping - - - - 0  Tired, decreased energy - - - - 2  Change in appetite - - - - 1  Feeling bad or failure about yourself  - - - - 1  Trouble concentrating - - - - 3  Moving slowly or fidgety/restless - - - - 2  Suicidal thoughts - - - - 1  PHQ-9 Score - - - - 12  Difficult doing work/chores - - - - Very difficult    Fall Risk: Fall Risk  04/07/2016 03/12/2016 02/04/2016 09/20/2015 05/01/2015  Falls in the past year? No No Yes Yes No  Number falls in past yr: - - 2 or more 1 -  Injury with Fall? - No No No -    Functional Status Survey: Is the patient deaf or have difficulty hearing?: No Does the patient have difficulty seeing, even when wearing glasses/contacts?: No Does the patient have difficulty concentrating, remembering, or making decisions?: No Does the patient have difficulty walking or climbing stairs?: No Does the patient have difficulty dressing or bathing?: No Does the patient have difficulty doing errands alone such as visiting a doctor's office or shopping?: No    Assessment & Plan  1. Bronchitis  Sick for 2 weeks, still has rhonchi we will try medications below, call back if no improvement - predniSONE (DELTASONE) 20 MG tablet; Take 1 tablet (20 mg total) by mouth 2 (two) times daily with a meal.  Dispense: 10 tablet; Refill:  0 - azithromycin (ZITHROMAX Z-PAK) 250 MG tablet; Take as directed  Dispense: 6 each; Refill: 0  2. Weight loss  Likely from URI, we will monitor

## 2016-04-10 ENCOUNTER — Other Ambulatory Visit: Payer: Self-pay | Admitting: Family Medicine

## 2016-04-10 ENCOUNTER — Other Ambulatory Visit: Payer: Self-pay

## 2016-04-10 DIAGNOSIS — M25562 Pain in left knee: Secondary | ICD-10-CM

## 2016-04-10 DIAGNOSIS — M25561 Pain in right knee: Secondary | ICD-10-CM

## 2016-04-10 DIAGNOSIS — M659 Synovitis and tenosynovitis, unspecified: Secondary | ICD-10-CM

## 2016-04-10 MED ORDER — MELOXICAM 15 MG PO TABS: 15.0000 mg | ORAL_TABLET | Freq: Every day | ORAL | Status: DC

## 2016-04-10 NOTE — Telephone Encounter (Signed)
Got a fax from [facility] requesting a refill of this patient's Mobic 15mg .  Refill request was sent to Dr. Steele Sizer for approval and submission.

## 2016-04-10 NOTE — Telephone Encounter (Signed)
Patient requesting refill. 

## 2016-04-17 ENCOUNTER — Encounter: Payer: Self-pay | Admitting: Gastroenterology

## 2016-04-17 ENCOUNTER — Other Ambulatory Visit: Payer: Self-pay

## 2016-04-17 ENCOUNTER — Ambulatory Visit (INDEPENDENT_AMBULATORY_CARE_PROVIDER_SITE_OTHER): Payer: BLUE CROSS/BLUE SHIELD | Admitting: Gastroenterology

## 2016-04-17 VITALS — BP 130/71 | HR 73 | Temp 98.2°F | Ht 61.0 in | Wt 179.0 lb

## 2016-04-17 DIAGNOSIS — R109 Unspecified abdominal pain: Secondary | ICD-10-CM | POA: Diagnosis not present

## 2016-04-17 MED ORDER — PEG 3350-KCL-NABCB-NACL-NASULF 236 G PO SOLR: 4000.0000 mL | Freq: Once | ORAL | Status: DC

## 2016-04-17 NOTE — Progress Notes (Signed)
Primary Care Physician: Loistine Chance, MD  Primary Gastroenterologist:  Dr. Lucilla Lame  Chief Complaint  Patient presents with  . Abdominal Pain    HPI: Deanna Baker is a 55 y.o. female here for abdominal pain on the left side. The patient reports that she has episodes of nausea vomiting abdominal pain intermittently a few times a year. The patient has had a extensive workup for possible allergy to the things she had eaten just prior to getting sick. The patient states her last episode lasted 7 days. When the symptoms resolve they resolve completely. The patient did have some left-sided abdominal pain and told her oncologist about a. The patient was told to follow-up with me. The patient states that since then the left side abdominal pain has completely resolved.  Current Outpatient Prescriptions  Medication Sig Dispense Refill  . ALPRAZolam (XANAX) 0.5 MG tablet Take 1 tablet (0.5 mg total) by mouth 2 (two) times daily as needed for anxiety. 30 tablet 0  . amLODipine-valsartan (EXFORGE) 5-320 MG tablet Take 1 tablet by mouth daily. 90 tablet 1  . buPROPion (WELLBUTRIN XL) 300 MG 24 hr tablet Take 1 tablet (300 mg total) by mouth daily. 30 tablet 5  . docusate sodium (COLACE) 100 MG capsule Take 100 mg by mouth 2 (two) times daily.    . Elastic Bandages & Supports (THUMB BRACE) MISC 1 Units by Does not apply route daily. 1 each 0  . EPIPEN 2-PAK 0.3 MG/0.3ML SOAJ injection Reported on 03/12/2016  1  . ferrous gluconate (FERGON) 324 MG tablet Take 1 tablet (324 mg total) by mouth daily with breakfast. 30 tablet 3  . gabapentin (NEURONTIN) 400 MG capsule Take 1 capsule (400 mg total) by mouth 3 (three) times daily. 90 capsule 5  . HYDROcodone-acetaminophen (NORCO) 10-325 MG tablet Take 1 tablet by mouth 3 (three) times daily as needed. 30 tablet 0  . meloxicam (MOBIC) 15 MG tablet Take 1 tablet (15 mg total) by mouth daily. 30 tablet 2  . Multiple Vitamins-Minerals (CENTRUM SILVER ADULT  50+ PO) Take by mouth.    . Nerve Stimulator (STANDARD TENS) DEVI 1 Units by Does not apply route daily. 1 Device 0  . nitroGLYCERIN (NITROSTAT) 0.4 MG SL tablet Place 1 tablet under the tongue as needed. Reported on 03/12/2016    . omeprazole (PRILOSEC) 40 MG capsule Take 1 capsule (40 mg total) by mouth 2 (two) times daily. 60 capsule 2  . ondansetron (ZOFRAN) 4 MG tablet TAKE 1 TABLET EVERY 8 HOURS AS NEEDED FOR NAUSEA AND VOMITING 20 tablet 0  . PARoxetine (PAXIL) 40 MG tablet Take 1 tablet (40 mg total) by mouth every morning. 30 tablet 5  . traMADol (ULTRAM) 50 MG tablet Take 1 tablet (50 mg total) by mouth every 6 (six) hours as needed. 60 tablet 2  . azithromycin (ZITHROMAX Z-PAK) 250 MG tablet Take as directed (Patient not taking: Reported on 04/17/2016) 6 each 0  . polyethylene glycol (GOLYTELY) 236 g solution Take 4,000 mLs by mouth once. Drink one 8 oz glass every 20 mins until stools are clear 4000 mL 0  . predniSONE (DELTASONE) 20 MG tablet Take 1 tablet (20 mg total) by mouth 2 (two) times daily with a meal. (Patient not taking: Reported on 04/17/2016) 10 tablet 0   No current facility-administered medications for this visit.    Allergies as of 04/17/2016 - Review Complete 04/07/2016  Allergen Reaction Noted  . Ace inhibitors Swelling 02/04/2016  .  Pregabalin  05/01/2015  . Venlafaxine  05/01/2015    ROS:  General: Negative for anorexia, weight loss, fever, chills, fatigue, weakness. ENT: Negative for hoarseness, difficulty swallowing , nasal congestion. CV: Negative for chest pain, angina, palpitations, dyspnea on exertion, peripheral edema.  Respiratory: Negative for dyspnea at rest, dyspnea on exertion, cough, sputum, wheezing.  GI: See history of present illness. GU:  Negative for dysuria, hematuria, urinary incontinence, urinary frequency, nocturnal urination.  Endo: Negative for unusual weight change.    Physical Examination:   BP 130/71 mmHg  Pulse 73  Temp(Src)  98.2 F (36.8 C) (Oral)  Ht 5\' 1"  (1.549 m)  Wt 179 lb (81.194 kg)  BMI 33.84 kg/m2  General: Well-nourished, well-developed in no acute distress.  Eyes: No icterus. Conjunctivae pink. Mouth: Oropharyngeal mucosa moist and pink , no lesions erythema or exudate. Lungs: Clear to auscultation bilaterally. Non-labored. Heart: Regular rate and rhythm, no murmurs rubs or gallops.  Abdomen: Bowel sounds are normal, nontender, nondistended, no hepatosplenomegaly or masses, no abdominal bruits or hernia , no rebound or guarding.   Extremities: No lower extremity edema. No clubbing or deformities. Neuro: Alert and oriented x 3.  Grossly intact. Skin: Warm and dry, no jaundice.   Psych: Alert and cooperative, normal mood and affect.  Labs:    Imaging Studies: No results found.  Assessment and Plan:   Deanna Baker is a 55 y.o. y/o female who comes in today with a history of left-sided abdominal pain and she reports that the abdominal pain is completely resolved and she has no problems at the present time. The patient also is due for her colonoscopy and will be set up for her colonoscopy due to her history of polyps. The patient has been explained the plan and agrees with it.   Note: This dictation was prepared with Dragon dictation along with smaller phrase technology. Any transcriptional errors that result from this process are unintentional.

## 2016-04-23 ENCOUNTER — Encounter: Payer: Self-pay | Admitting: *Deleted

## 2016-04-23 ENCOUNTER — Telehealth: Payer: Self-pay | Admitting: Family Medicine

## 2016-04-23 MED ORDER — FLUTICASONE FUROATE-VILANTEROL 100-25 MCG/INH IN AEPB
1.0000 | INHALATION_SPRAY | Freq: Every day | RESPIRATORY_TRACT | Status: DC
Start: 2016-04-23 — End: 2016-05-22

## 2016-04-23 NOTE — Telephone Encounter (Signed)
Patient informed and will pick up today

## 2016-04-23 NOTE — Telephone Encounter (Signed)
It is for breo, rx printed, call back for her to pick both up. Thank you

## 2016-04-23 NOTE — Telephone Encounter (Signed)
Patient was diagnosed with bronchitis a few weeks ago. Was told by you to stop by the office to pick up a voucher for an inhaler. Do you know which voucher you are wanting her to have?

## 2016-04-24 NOTE — Discharge Instructions (Signed)

## 2016-04-28 ENCOUNTER — Ambulatory Visit: Payer: BLUE CROSS/BLUE SHIELD | Admitting: Anesthesiology

## 2016-04-28 ENCOUNTER — Ambulatory Visit
Admission: RE | Admit: 2016-04-28 | Discharge: 2016-04-28 | Disposition: A | Discharge status: Home / Self Care | Payer: BLUE CROSS/BLUE SHIELD | Source: Ambulatory Visit | Attending: Gastroenterology | Admitting: Gastroenterology

## 2016-04-28 ENCOUNTER — Encounter
Admission: RE | Disposition: A | Discharge status: Home / Self Care | Payer: Self-pay | Source: Ambulatory Visit | Attending: Gastroenterology

## 2016-04-28 DIAGNOSIS — M1990 Unspecified osteoarthritis, unspecified site: Secondary | ICD-10-CM | POA: Diagnosis not present

## 2016-04-28 DIAGNOSIS — K621 Rectal polyp: Secondary | ICD-10-CM | POA: Insufficient documentation

## 2016-04-28 DIAGNOSIS — I10 Essential (primary) hypertension: Secondary | ICD-10-CM | POA: Diagnosis not present

## 2016-04-28 DIAGNOSIS — G4733 Obstructive sleep apnea (adult) (pediatric): Secondary | ICD-10-CM | POA: Insufficient documentation

## 2016-04-28 DIAGNOSIS — Z860100 Personal history of colon polyps, unspecified: Secondary | ICD-10-CM | POA: Insufficient documentation

## 2016-04-28 DIAGNOSIS — Z79891 Long term (current) use of opiate analgesic: Secondary | ICD-10-CM | POA: Diagnosis not present

## 2016-04-28 DIAGNOSIS — K641 Second degree hemorrhoids: Secondary | ICD-10-CM | POA: Diagnosis not present

## 2016-04-28 DIAGNOSIS — Z1211 Encounter for screening for malignant neoplasm of colon: Secondary | ICD-10-CM | POA: Diagnosis present

## 2016-04-28 DIAGNOSIS — Z87891 Personal history of nicotine dependence: Secondary | ICD-10-CM | POA: Insufficient documentation

## 2016-04-28 DIAGNOSIS — K219 Gastro-esophageal reflux disease without esophagitis: Secondary | ICD-10-CM | POA: Insufficient documentation

## 2016-04-28 DIAGNOSIS — Z888 Allergy status to other drugs, medicaments and biological substances status: Secondary | ICD-10-CM | POA: Insufficient documentation

## 2016-04-28 DIAGNOSIS — Z791 Long term (current) use of non-steroidal anti-inflammatories (NSAID): Secondary | ICD-10-CM | POA: Diagnosis not present

## 2016-04-28 DIAGNOSIS — Z7951 Long term (current) use of inhaled steroids: Secondary | ICD-10-CM | POA: Diagnosis not present

## 2016-04-28 DIAGNOSIS — R5382 Chronic fatigue, unspecified: Secondary | ICD-10-CM | POA: Diagnosis not present

## 2016-04-28 DIAGNOSIS — Z79899 Other long term (current) drug therapy: Secondary | ICD-10-CM | POA: Insufficient documentation

## 2016-04-28 DIAGNOSIS — Z803 Family history of malignant neoplasm of breast: Secondary | ICD-10-CM | POA: Insufficient documentation

## 2016-04-28 DIAGNOSIS — I341 Nonrheumatic mitral (valve) prolapse: Secondary | ICD-10-CM | POA: Insufficient documentation

## 2016-04-28 DIAGNOSIS — M797 Fibromyalgia: Secondary | ICD-10-CM | POA: Diagnosis not present

## 2016-04-28 DIAGNOSIS — D123 Benign neoplasm of transverse colon: Secondary | ICD-10-CM | POA: Diagnosis not present

## 2016-04-28 HISTORY — PX: POLYPECTOMY: SHX5525

## 2016-04-28 HISTORY — DX: Gastro-esophageal reflux disease without esophagitis: K21.9

## 2016-04-28 HISTORY — DX: Reserved for inherently not codable concepts without codable children: IMO0001

## 2016-04-28 HISTORY — PX: COLONOSCOPY WITH PROPOFOL: SHX5780

## 2016-04-28 HISTORY — DX: Bronchitis, not specified as acute or chronic: J40

## 2016-04-28 HISTORY — DX: Nonrheumatic mitral (valve) prolapse: I34.1

## 2016-04-28 HISTORY — DX: Anemia, unspecified: D64.9

## 2016-04-28 SURGERY — COLONOSCOPY WITH PROPOFOL
Anesthesia: Monitor Anesthesia Care | Wound class: Contaminated

## 2016-04-28 MED ORDER — STERILE WATER FOR IRRIGATION IR SOLN
Status: DC | PRN
  Administered 2016-04-28: 08:00:00

## 2016-04-28 MED ORDER — ACETAMINOPHEN 325 MG PO TABS: 325.0000 mg | ORAL_TABLET | ORAL | Status: DC | PRN

## 2016-04-28 MED ORDER — ACETAMINOPHEN 160 MG/5ML PO SOLN: 325.0000 mg | ORAL | Status: DC | PRN

## 2016-04-28 MED ORDER — LACTATED RINGERS IV SOLN
INTRAVENOUS | Status: DC | PRN
  Administered 2016-04-28: 08:00:00 via INTRAVENOUS

## 2016-04-28 MED ORDER — PROPOFOL 10 MG/ML IV BOLUS
INTRAVENOUS | Status: DC | PRN
  Administered 2016-04-28 (×6): 50 mg via INTRAVENOUS

## 2016-04-28 MED ORDER — LIDOCAINE HCL (CARDIAC) 20 MG/ML IV SOLN
INTRAVENOUS | Status: DC | PRN
  Administered 2016-04-28: 40 mg via INTRAVENOUS

## 2016-04-28 SURGICAL SUPPLY — 22 items
CANISTER SUCT 1200ML W/VALVE (MISCELLANEOUS) ×3 IMPLANT
CLIP HMST 235XBRD CATH ROT (MISCELLANEOUS) IMPLANT
CLIP RESOLUTION 360 11X235 (MISCELLANEOUS)
FCP ESCP3.2XJMB 240X2.8X (MISCELLANEOUS)
FORCEPS BIOP RAD 4 LRG CAP 4 (CUTTING FORCEPS) ×3 IMPLANT
FORCEPS BIOP RJ4 240 W/NDL (MISCELLANEOUS)
FORCEPS ESCP3.2XJMB 240X2.8X (MISCELLANEOUS) IMPLANT
GOWN CVR UNV OPN BCK APRN NK (MISCELLANEOUS) ×4 IMPLANT
GOWN ISOL THUMB LOOP REG UNIV (MISCELLANEOUS) ×2
INJECTOR VARIJECT VIN23 (MISCELLANEOUS) IMPLANT
KIT DEFENDO VALVE AND CONN (KITS) IMPLANT
KIT ENDO PROCEDURE OLY (KITS) ×3 IMPLANT
MARKER SPOT ENDO TATTOO 5ML (MISCELLANEOUS) IMPLANT
PAD GROUND ADULT SPLIT (MISCELLANEOUS) IMPLANT
PROBE APC STR FIRE (PROBE) IMPLANT
SNARE SHORT THROW 13M SML OVAL (MISCELLANEOUS) ×3 IMPLANT
SNARE SHORT THROW 30M LRG OVAL (MISCELLANEOUS) IMPLANT
SNARE SNG USE RND 15MM (INSTRUMENTS) IMPLANT
SPOT EX ENDOSCOPIC TATTOO (MISCELLANEOUS)
TRAP ETRAP POLY (MISCELLANEOUS) ×3 IMPLANT
VARIJECT INJECTOR VIN23 (MISCELLANEOUS)
WATER STERILE IRR 250ML POUR (IV SOLUTION) ×3 IMPLANT

## 2016-04-28 NOTE — Transfer of Care (Signed)
Immediate Anesthesia Transfer of Care Note  Patient: Deanna Baker  Procedure(s) Performed: Procedure(s) with comments: COLONOSCOPY WITH PROPOFOL (N/A) - CPAP POLYPECTOMY  Patient Location: PACU  Anesthesia Type: MAC  Level of Consciousness: awake, alert  and patient cooperative  Airway and Oxygen Therapy: Patient Spontanous Breathing and Patient connected to supplemental oxygen  Post-op Assessment: Post-op Vital signs reviewed, Patient's Cardiovascular Status Stable, Respiratory Function Stable, Patent Airway and No signs of Nausea or vomiting  Post-op Vital Signs: Reviewed and stable  Complications: No apparent anesthesia complications

## 2016-04-28 NOTE — Anesthesia Preprocedure Evaluation (Signed)
Anesthesia Evaluation  Patient identified by MRN, date of birth, ID band  Reviewed: Allergy & Precautions, H&P , NPO status , Patient's Chart, lab work & pertinent test results  Airway Mallampati: II  TM Distance: >3 FB Neck ROM: full    Dental no notable dental hx.    Pulmonary sleep apnea , former smoker,    Pulmonary exam normal        Cardiovascular hypertension,  Rhythm:regular Rate:Normal     Neuro/Psych PSYCHIATRIC DISORDERS  Neuromuscular disease    GI/Hepatic GERD  ,  Endo/Other    Renal/GU      Musculoskeletal   Abdominal   Peds  Hematology   Anesthesia Other Findings   Reproductive/Obstetrics                             Anesthesia Physical Anesthesia Plan  ASA: III  Anesthesia Plan: MAC   Post-op Pain Management:    Induction:   Airway Management Planned:   Additional Equipment:   Intra-op Plan:   Post-operative Plan:   Informed Consent: I have reviewed the patients History and Physical, chart, labs and discussed the procedure including the risks, benefits and alternatives for the proposed anesthesia with the patient or authorized representative who has indicated his/her understanding and acceptance.     Plan Discussed with: CRNA  Anesthesia Plan Comments:         Anesthesia Quick Evaluation

## 2016-04-28 NOTE — Transfer of Care (Signed)
Immediate Anesthesia Transfer of Care Note  Patient: KLEIGH HAIN  Procedure(s) Performed: Procedure(s) with comments: COLONOSCOPY WITH PROPOFOL (N/A) - CPAP POLYPECTOMY  Patient Location: PACU  Anesthesia Type: MAC  Level of Consciousness: awake, alert  and patient cooperative  Airway and Oxygen Therapy: Patient Spontanous Breathing and Patient connected to supplemental oxygen  Post-op Assessment: Post-op Vital signs reviewed, Patient's Cardiovascular Status Stable, Respiratory Function Stable, Patent Airway and No signs of Nausea or vomiting  Post-op Vital Signs: Reviewed and stable  Complications: No apparent anesthesia complications

## 2016-04-28 NOTE — Anesthesia Procedure Notes (Signed)
Procedure Name: MAC Performed by: Derrika Ruffalo Pre-anesthesia Checklist: Patient identified, Emergency Drugs available, Suction available, Patient being monitored and Timeout performed Patient Re-evaluated:Patient Re-evaluated prior to inductionOxygen Delivery Method: Nasal cannula Placement Confirmation: positive ETCO2 and breath sounds checked- equal and bilateral     

## 2016-04-28 NOTE — H&P (Signed)
[facility]  [address], [place], [place] [address] Phone: [phone] Fax : [phone]  Primary Care Physician:  Loistine Chance, MD Primary Gastroenterologist:  Dr. Allen Norris  Pre-Procedure History & Physical: HPI:  Deanna Baker is a 55 y.o. female is here for an colonoscopy.   Past Medical History  Diagnosis Date  . Fibromyalgia 1993  . Arthritis 1990  . Hypertension 2012  . Heart murmur   . Measles   . Lump or mass in breast   . Mumps 1973  . Chronic fatigue syndrome   . Colon polyp 2013  . 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 Bronchitis 5/17  . MVP (mitral valve prolapse)   . Anemia   . Obstructive sleep apnea on CPAP 11/27/15  . GERD (gastroesophageal reflux disease)   . Shortness of breath dyspnea     chronic fatigue/fibromyalgia    Past Surgical History  Procedure Laterality Date  . Upper gi endoscopy      Dr. Suzette Battiest  . Shoulder surgery Left 2009  . Colonoscopy  2013    Dr. Suzette Battiest   . Tubal ligation  1982  . Tonsillectomy  1965  . Breast lumpectomy Left 12/06/12     lumpectomy with SN biopsy and power port placement...  . Back surgery    . Esophagogastroduodenoscopy (egd) with propofol N/A 10/23/2015    Procedure: ESOPHAGOGASTRODUODENOSCOPY (EGD) WITH PROPOFOL;  Surgeon: Lucilla Lame, MD;  Location: [facility] ENDOSCOPY;  Service: Endoscopy;  Laterality: N/A;    Prior to Admission medications   Medication Sig Start Date End Date Taking? Authorizing Provider  ALPRAZolam Duanne Moron) 0.5 MG tablet Take 1 tablet (0.5 mg total) by mouth 2 (two) times daily as needed for anxiety. 03/12/16  Yes Steele Sizer, MD  amLODipine-valsartan (EXFORGE) 5-320 MG tablet Take 1 tablet by mouth daily. 03/12/16  Yes Steele Sizer, MD  buPROPion (WELLBUTRIN XL) 300 MG 24 hr tablet Take 1 tablet (300 mg total) by mouth daily. 02/04/16  Yes Steele Sizer, MD  dexlansoprazole  (DEXILANT) 60 MG capsule Take 60 mg by mouth daily.   Yes Historical Provider, MD  docusate sodium (COLACE) 100 MG capsule Take 100 mg by mouth 2 (two) times daily.   Yes Historical Provider, MD  ferrous gluconate (FERGON) 324 MG tablet Take 1 tablet (324 mg total) by mouth daily with breakfast. 03/18/16  Yes Forest Gleason, MD  fluticasone furoate-vilanterol (BREO ELLIPTA) 100-25 MCG/INH AEPB Inhale 1 puff into the lungs daily. 04/23/16  Yes Steele Sizer, MD  gabapentin (NEURONTIN) 400 MG capsule Take 1 capsule (400 mg total) by mouth 3 (three) times daily. 02/04/16  Yes Steele Sizer, MD  HYDROcodone-acetaminophen (NORCO) 10-325 MG tablet Take 1 tablet by mouth 3 (three) times daily as needed. 03/12/16  Yes Steele Sizer, MD  meloxicam (MOBIC) 15 MG tablet Take 1 tablet (15 mg total) by mouth daily. 04/10/16  Yes Steele Sizer, MD  Multiple Vitamins-Minerals (CENTRUM SILVER ADULT 50+ PO) Take by mouth.   Yes Historical Provider, MD  ondansetron (ZOFRAN) 4 MG tablet TAKE 1 TABLET EVERY 8 HOURS AS NEEDED FOR NAUSEA AND VOMITING 03/12/16  Yes Steele Sizer, MD  PARoxetine (PAXIL) 40 MG tablet Take 1 tablet (40 mg total) by mouth every morning. 02/04/16  Yes Steele Sizer, MD  polyethylene glycol (GOLYTELY) 236 g solution Take 4,000 mLs by mouth once. Drink one 8 oz glass every 20 mins until stools are clear  04/17/16  Yes Lucilla Lame, MD  traMADol (ULTRAM) 50 MG tablet Take 1 tablet (50 mg total) by mouth every 6 (six) hours as needed. 11/02/15  Yes Steele Sizer, MD  Elastic Bandages & Supports (THUMB BRACE) MISC 1 Units by Does not apply route daily. 03/12/16   Steele Sizer, MD  EPIPEN 2-PAK 0.3 MG/0.3ML SOAJ injection Reported on 04/28/2016 10/14/15   Historical Provider, MD  Nerve Stimulator (STANDARD TENS) DEVI 1 Units by Does not apply route daily. 03/12/16   Steele Sizer, MD  nitroGLYCERIN (NITROSTAT) 0.4 MG SL tablet Place 1 tablet under the tongue as needed. Reported on 03/12/2016 05/11/15 05/10/16   Historical Provider, MD    Allergies as of 04/17/2016 - Review Complete 04/07/2016  Allergen Reaction Noted  . Ace inhibitors Swelling 02/04/2016  . Pregabalin  05/01/2015  . Venlafaxine  05/01/2015    Family History  Problem Relation Age of Onset  . Breast cancer Mother     Social History   Social History  . Marital Status: Married    Spouse Name: N/A  . Number of Children: N/A  . Years of Education: N/A   Occupational History  . Not on file.   Social History Main Topics  . Smoking status: Former Smoker -- 1.00 packs/day for 20 years    Types: Cigarettes    Quit date: 11/24/1980  . Smokeless tobacco: Never Used  . Alcohol Use: 0.0 oz/week    0 Standard drinks or equivalent per week     Comment: rarely, 1-2x/mo  . Drug Use: No  . Sexual Activity:    Partners: Male   Other Topics Concern  . Not on file   Social History Narrative    Review of Systems: See HPI, otherwise negative ROS  Physical Exam: BP 109/53 mmHg  Pulse 81  Temp(Src) 98.1 F (36.7 C) (Temporal)  Resp 16  Ht 5\' 1"  (1.549 m)  Wt 176 lb (79.833 kg)  BMI 33.27 kg/m2  SpO2 97% General:   Alert,  pleasant and cooperative in NAD Head:  Normocephalic and atraumatic. Neck:  Supple; no masses or thyromegaly. Lungs:  Clear throughout to auscultation.    Heart:  Regular rate and rhythm. Abdomen:  Soft, nontender and nondistended. Normal bowel sounds, without guarding, and without rebound.   Neurologic:  Alert and  oriented x4;  grossly normal neurologically.  Impression/Plan: [name] is here for an colonoscopy to be performed for history of colon polyps  Risks, benefits, limitations, and alternatives regarding  colonoscopy have been reviewed with the patient.  Questions have been answered.  All parties agreeable.   Lucilla Lame, MD  04/28/2016, 7:42 AM

## 2016-04-28 NOTE — Op Note (Signed)
[facility]enter[facility] Patient Name: Deanna Baker Procedure Date: 04/28/2016 7:40 AM MRN: [id number] Account #: [id number] Date of Birth: [birth year] Admit Type: Outpatient Age: 55 Room: [facility] OR ROOM 01 Gender: Female Note Status: Finalized Procedure:            Colonoscopy Indications:          High risk colon cancer surveillance: Personal history                        of colonic polyps Providers:            Lucilla Lame, MD Referring MD:         Bethena Roys. Sowles, MD (Referring MD) Medicines:            Propofol per Anesthesia Complications:        No immediate complications. Procedure:            Pre-Anesthesia Assessment:                       - Prior to the procedure, a History and Physical was                        performed, and patient medications and allergies were                        reviewed. The patient's tolerance of previous                        anesthesia was also reviewed. The risks and benefits of                        the procedure and the sedation options and risks were                        discussed with the patient. All questions were                        answered, and informed consent was obtained. Prior                        Anticoagulants: The patient has taken no previous                        anticoagulant or antiplatelet agents. ASA Grade                        Assessment: II - A patient with mild systemic disease.                        After reviewing the risks and benefits, the patient was                        deemed in satisfactory condition to undergo the                        procedure.                       After obtaining informed consent, the colonoscope was  passed under direct vision. Throughout the procedure,                        the patient's blood pressure, pulse, and oxygen                        saturations were monitored continuously. The Olympus CF   H180AL colonoscope (S#: [id number]) was introduced through                        the anus and advanced to the the cecum, identified by                        appendiceal orifice and ileocecal valve. The                        colonoscopy was performed without difficulty. The                        patient tolerated the procedure well. The quality of                        the bowel preparation was good. Findings:      The perianal and digital rectal examinations were normal.      A 3 mm polyp was found in the transverse colon. The polyp was sessile.       The polyp was removed with a cold biopsy forceps. Resection and       retrieval were complete.      A 7 mm polyp was found in the transverse colon. The polyp was sessile.       The polyp was removed with a cold snare. Resection and retrieval were       complete.      A 2 mm polyp was found in the rectum. The polyp was removed with a cold       biopsy forceps. Resection and retrieval were complete.      Non-bleeding internal hemorrhoids were found during retroflexion. The       hemorrhoids were Grade II (internal hemorrhoids that prolapse but reduce       spontaneously). Impression:           - One 3 mm polyp in the transverse colon, removed with                        a cold biopsy forceps. Resected and retrieved.                       - One 7 mm polyp in the transverse colon, removed with                        a cold snare. Resected and retrieved.                       - One 2 mm polyp in the rectum, removed with a cold                        biopsy forceps. Resected and retrieved.                       - Non-bleeding internal  hemorrhoids. Recommendation:       - Repeat colonoscopy in 5 years for surveillance. Procedure Code(s):    --- Professional ---                       [phone], Colonoscopy, flexible; with removal of tumor(s),                        polyp(s), or other lesion(s) by snare technique                       [id number], 68,  Colonoscopy, flexible; with biopsy, single                        or multiple Diagnosis Code(s):    --- Professional ---                       Z86.010, Personal history of colonic polyps                       D12.3, Benign neoplasm of transverse colon (hepatic                        flexure or splenic flexure)                       K62.1, Rectal polyp CPT copyright 2016 American Medical Association. All rights reserved. The codes documented in this report are preliminary and upon coder review may  be revised to meet current compliance requirements. Lucilla Lame, MD 04/28/2016 8:10:43 AM This report has been signed electronically. Number of Addenda: 0 Note Initiated On: 04/28/2016 7:40 AM Scope Withdrawal Time: 0 hours 8 minutes 33 seconds  Total Procedure Duration: 0 hours 14 minutes 39 seconds       [facility]

## 2016-04-28 NOTE — Anesthesia Postprocedure Evaluation (Signed)
Anesthesia Post Note  Patient: Deanna Baker  Procedure(s) Performed: Procedure(s) (LRB): COLONOSCOPY WITH PROPOFOL (N/A) POLYPECTOMY  Patient location during evaluation: PACU Anesthesia Type: MAC Level of consciousness: awake and alert and oriented Pain management: satisfactory to patient Vital Signs Assessment: post-procedure vital signs reviewed and stable Respiratory status: spontaneous breathing, nonlabored ventilation and respiratory function stable Cardiovascular status: blood pressure returned to baseline and stable Postop Assessment: Adequate PO intake and No signs of nausea or vomiting Anesthetic complications: no    Raliegh Ip

## 2016-04-29 ENCOUNTER — Encounter: Payer: Self-pay | Admitting: Gastroenterology

## 2016-04-30 ENCOUNTER — Encounter: Payer: Self-pay | Admitting: Gastroenterology

## 2016-05-02 ENCOUNTER — Encounter: Payer: Self-pay | Admitting: Gastroenterology

## 2016-05-07 ENCOUNTER — Other Ambulatory Visit: Payer: Self-pay | Admitting: Family Medicine

## 2016-05-07 NOTE — Telephone Encounter (Signed)
Patient requesting refill. 

## 2016-05-12 ENCOUNTER — Ambulatory Visit (INDEPENDENT_AMBULATORY_CARE_PROVIDER_SITE_OTHER): Payer: BLUE CROSS/BLUE SHIELD | Admitting: Family Medicine

## 2016-05-12 ENCOUNTER — Encounter: Payer: Self-pay | Admitting: Family Medicine

## 2016-05-12 VITALS — BP 130/76 | HR 81 | Temp 98.1°F | Resp 16 | Wt 180.9 lb

## 2016-05-12 DIAGNOSIS — M797 Fibromyalgia: Secondary | ICD-10-CM | POA: Diagnosis not present

## 2016-05-12 DIAGNOSIS — K219 Gastro-esophageal reflux disease without esophagitis: Secondary | ICD-10-CM

## 2016-05-12 DIAGNOSIS — I10 Essential (primary) hypertension: Secondary | ICD-10-CM

## 2016-05-12 DIAGNOSIS — G43009 Migraine without aura, not intractable, without status migrainosus: Secondary | ICD-10-CM

## 2016-05-12 DIAGNOSIS — R439 Unspecified disturbances of smell and taste: Secondary | ICD-10-CM

## 2016-05-12 DIAGNOSIS — D509 Iron deficiency anemia, unspecified: Secondary | ICD-10-CM | POA: Diagnosis not present

## 2016-05-12 DIAGNOSIS — R413 Other amnesia: Secondary | ICD-10-CM | POA: Diagnosis not present

## 2016-05-12 DIAGNOSIS — G4730 Sleep apnea, unspecified: Secondary | ICD-10-CM

## 2016-05-12 DIAGNOSIS — F339 Major depressive disorder, recurrent, unspecified: Secondary | ICD-10-CM

## 2016-05-12 MED ORDER — ALPRAZOLAM 0.5 MG PO TABS: 0.5000 mg | ORAL_TABLET | Freq: Two times a day (BID) | ORAL | Status: DC | PRN

## 2016-05-12 NOTE — Progress Notes (Signed)
Name: Deanna Baker   MRN: [id number]    DOB: 11-28-1960   Date:05/12/2016       Progress Note  Subjective  Chief Complaint  Chief Complaint  Patient presents with  . Hypertension    patient is here for her 55-monthf/u  . Back Pain    patient is having a lot of pain today in her lower back and had to take a pain pill while in office  . Neck Pain    patient is having a lot of pain today and had to take a pain pill while in office    HPI  HTN: she is now on Exforge in place of Lotrel, she is concerned with side effects - such as forgetfulness, and also worried that bronchitis was caused by that. Explained I don't think side effects not related to medication.   FMS: she continues to have daily pain, taking pain medications :gabapentin, and antidepressant. She has daily mental fogginess, trigger point pains, and also feeling forgetful.  She is compliant with medication, taking pain medication and Neurontin. Hydrocodone only when in severe pain.   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 Post-activity fatigue is severe. Off stimulants, but does not want to go back on medication   Reflux esophagitis: choking and also esophageal thickenes on CT chest/abdomen. Seen by Dr. WDurwin Regesshe had an EGD and esophageal dilation, and was diagnosed with reflux esophagitis and advised to increase PPI to twice daily. She has noticed an improvement of her symptoms since she increased dose of PPI. She still has regurgitation occasionally. She states cough seems to make reflux worse  Major Depression: still feels tired - she also has chronic fatigue syndrome taking medication as prescribed. Occasionally has down days usually related to FMS and CFIDS. She denies crying spells, lack of anhedonia, or suicidal thoughts or ideation.   Neurological changes: she has noticed a lack of taste and smell, also has noticed change in her balance ( stumbling ), and memory loss. She  states she has lapse in memory. She denies headaches ( migraines resolved since menopause). She states it may have been going on for the past couple of months  Sleep Apnea: she is still wearing CPAP every night, still feels tired all day, likely from FMS  Bronchitis: she is on Breo now and symptoms are improving. She also states SOB has improved     Patient Active Problem List   Diagnosis Date Noted  . Personal history of colonic polyps   . Benign neoplasm of transverse colon   . Rectal polyp   . Tenosynovitis of thumb 03/12/2016  . Bilateral knee pain 03/12/2016  . Reflux esophagitis   . Stricture and stenosis of esophagus   . Abnormal findings-gastrointestinal tract   . Thickening of esophagus 08/03/2015  . Choking 07/04/2015  . Benign essential HTN 05/01/2015  . CFIDS (chronic fatigue and immune dysfunction syndrome) 05/01/2015  . Major depression, recurrent, chronic [place] 05/01/2015  . Dyslipidemia 05/01/2015  . Fibromyalgia syndrome 05/01/2015  . Blood glucose elevated 05/01/2015  . Eczema intertrigo 05/01/2015  . Chronic migraine without aura without status migrainosus, not intractable 05/01/2015  . Excessive urination at night 05/01/2015  . Dysmetabolic syndrome 012/75/1700 . History of colonoscopy with polypectomy 05/01/2015  . Central sleep apnea 05/01/2015  . History of back surgery 05/01/2015  . Thickened nails 05/01/2015  . Malignant neoplasm of upper-outer quadrant of female breast, triple negative 06/13/2013  Past Surgical History  Procedure Laterality Date  . Upper gi endoscopy      Dr. Suzette Battiest  . Shoulder surgery Left 2009  . Colonoscopy  2013    Dr. Suzette Battiest   . Tubal ligation  1982  . Tonsillectomy  1965  . Breast lumpectomy Left 12/06/12     lumpectomy with SN biopsy and power port placement...  . Back surgery    . Esophagogastroduodenoscopy (egd) with propofol N/A 10/23/2015    Procedure: ESOPHAGOGASTRODUODENOSCOPY (EGD) WITH PROPOFOL;   Surgeon: Lucilla Lame, MD;  Location: [facility] ENDOSCOPY;  Service: Endoscopy;  Laterality: N/A;  . Colonoscopy with propofol N/A 04/28/2016    Procedure: COLONOSCOPY WITH PROPOFOL;  Surgeon: Lucilla Lame, MD;  Location: [place];  Service: Endoscopy;  Laterality: N/A;  CPAP  . Polypectomy  04/28/2016    Procedure: POLYPECTOMY;  Surgeon: Lucilla Lame, MD;  Location: [place];  Service: Endoscopy;;    Family History  Problem Relation Age of Onset  . Breast cancer Mother     Social History   Social History  . Marital Status: Married    Spouse Name: N/A  . Number of Children: N/A  . Years of Education: N/A   Occupational History  . Not on file.   Social History Main Topics  . Smoking status: Former Smoker -- 1.00 packs/day for 20 years    Types: Cigarettes    Quit date: 11/24/1980  . Smokeless tobacco: Never Used  . Alcohol Use: 0.0 oz/week    0 Standard drinks or equivalent per week     Comment: rarely, 1-2x/mo  . Drug Use: No  . Sexual Activity:    Partners: Male   Other Topics Concern  . Not on file   Social History Narrative     Current outpatient prescriptions:  .  ALPRAZolam (XANAX) 0.5 MG tablet, Take 1 tablet (0.5 mg total) by mouth 2 (two) times daily as needed for anxiety., Disp: 30 tablet, Rfl: 0 .  amLODipine-valsartan (EXFORGE) 5-320 MG tablet, Take 1 tablet by mouth daily., Disp: 90 tablet, Rfl: 1 .  buPROPion (WELLBUTRIN XL) 300 MG 24 hr tablet, Take 1 tablet (300 mg total) by mouth daily., Disp: 30 tablet, Rfl: 5 .  dexlansoprazole (DEXILANT) 60 MG capsule, Take 60 mg by mouth daily., Disp: , Rfl:  .  docusate sodium (COLACE) 100 MG capsule, Take 100 mg by mouth 2 (two) times daily., Disp: , Rfl:  .  Elastic Bandages & Supports (THUMB BRACE) MISC, 1 Units by Does not apply route daily., Disp: 1 each, Rfl: 0 .  EPIPEN 2-PAK 0.3 MG/0.3ML SOAJ injection, Reported on 04/28/2016, Disp: , Rfl: 1 .  ferrous gluconate (FERGON) 324 MG tablet, Take 1  tablet (324 mg total) by mouth daily with breakfast., Disp: 30 tablet, Rfl: 3 .  fluticasone furoate-vilanterol (BREO ELLIPTA) 100-25 MCG/INH AEPB, Inhale 1 puff into the lungs daily., Disp: 60 each, Rfl: 0 .  gabapentin (NEURONTIN) 400 MG capsule, Take 1 capsule (400 mg total) by mouth 3 (three) times daily., Disp: 90 capsule, Rfl: 5 .  HYDROcodone-acetaminophen (NORCO) 10-325 MG tablet, Take 1 tablet by mouth 3 (three) times daily as needed., Disp: 30 tablet, Rfl: 0 .  meloxicam (MOBIC) 15 MG tablet, Take 1 tablet (15 mg total) by mouth daily., Disp: 30 tablet, Rfl: 2 .  Multiple Vitamins-Minerals (CENTRUM SILVER ADULT 50+ PO), Take by mouth., Disp: , Rfl:  .  Nerve Stimulator (STANDARD TENS) DEVI, 1 Units by Does not apply route  daily., Disp: 1 Device, Rfl: 0 .  omeprazole (PRILOSEC) 40 MG capsule, TAKE 1 CAPSULE (40 MG TOTAL) BY MOUTH 2 (TWO) TIMES DAILY., Disp: 60 capsule, Rfl: 2 .  ondansetron (ZOFRAN) 4 MG tablet, TAKE 1 TABLET EVERY 8 HOURS AS NEEDED FOR NAUSEA AND VOMITING, Disp: 20 tablet, Rfl: 0 .  PARoxetine (PAXIL) 40 MG tablet, Take 1 tablet (40 mg total) by mouth every morning., Disp: 30 tablet, Rfl: 5 .  polyethylene glycol (GOLYTELY) 236 g solution, Take 4,000 mLs by mouth once. Drink one 8 oz glass every 20 mins until stools are clear, Disp: 4000 mL, Rfl: 0 .  traMADol (ULTRAM) 50 MG tablet, Take 1 tablet (50 mg total) by mouth every 6 (six) hours as needed., Disp: 60 tablet, Rfl: 2  Allergies  Allergen Reactions  . Ace Inhibitors Swelling  . Pregabalin     weight gain and blurred vision  . Venlafaxine     foggy minded     ROS  Constitutional: Negative for fever or weight change.  Respiratory: Positive  for cough but no shortness of breath.   Cardiovascular: Negative for chest pain or palpitations.  Gastrointestinal: Negative for abdominal pain, no bowel changes.  Musculoskeletal: Negative for gait problem or joint swelling.  Skin: Negative for rash.  Neurological:  Positive  for dizziness no headache.  No other specific complaints in a complete review of systems (except as listed in HPI above).  Objective  Filed Vitals:   05/12/16 1158  BP: 130/76  Pulse: 81  Temp: 98.1 F (36.7 C)  TempSrc: Oral  Resp: 16  Weight: 180 lb 14.4 oz (82.056 kg)  SpO2: 98%    Body mass index is 34.2 kg/(m^2).  Physical Exam  Constitutional: Patient appears well-developed and well-nourished. Obese No distress.  HEENT: head atraumatic, normocephalic, pupils equal and reactive to light, neck supple, throat within normal limits Cardiovascular: Normal rate, regular rhythm and normal heart sounds. No murmur heard. Trace  BLE edema. Pulmonary/Chest: Effort normal and breath sounds normal. No respiratory distress. Abdominal: Soft. There is no tenderness. Psychiatric: Patient has a normal mood and affect. behavior is normal. Judgment and thought content normal. Muscular Skeletal: positive trigger points throughout.   Recent Results (from the past 2160 hour(s))  CBC with Differential     Status: Abnormal   Collection Time: 03/18/16 10:00 AM  Result Value Ref Range   WBC 5.5 3.6 - 11.0 K/uL   RBC 3.85 3.80 - 5.20 MIL/uL   Hemoglobin 10.0 (L) 12.0 - 16.0 g/dL   HCT 30.4 (L) 35.0 - 47.0 %   MCV 78.8 (L) 80.0 - 100.0 fL   MCH 25.9 (L) 26.0 - 34.0 pg   MCHC 32.8 32.0 - 36.0 g/dL   RDW 19.3 (H) 11.5 - 14.5 %   Platelets 199 150 - 440 K/uL   Neutrophils Relative % 71 %   Neutro Abs 4.0 1.4 - 6.5 K/uL   Lymphocytes Relative 18 %   Lymphs Abs 1.0 1.0 - 3.6 K/uL   Monocytes Relative 7 %   Monocytes Absolute 0.4 0.2 - 0.9 K/uL   Eosinophils Relative 3 %   Eosinophils Absolute 0.1 0 - 0.7 K/uL   Basophils Relative 1 %   Basophils Absolute 0.0 0 - 0.1 K/uL  Comprehensive metabolic panel     Status: Abnormal   Collection Time: 03/18/16 10:00 AM  Result Value Ref Range   Sodium 137 135 - 145 mmol/L   Potassium 4.6 3.5 - 5.1 mmol/L  Chloride 106 101 - 111 mmol/L    CO2 28 22 - 32 mmol/L   Glucose, Bld 102 (H) 65 - 99 mg/dL   BUN 19 6 - 20 mg/dL   Creatinine, Ser 1.03 (H) 0.44 - 1.00 mg/dL   Calcium 9.2 8.9 - 10.3 mg/dL   Total Protein 6.7 6.5 - 8.1 g/dL   Albumin 4.1 3.5 - 5.0 g/dL   AST 25 15 - 41 U/L   ALT 18 14 - 54 U/L   Alkaline Phosphatase 101 38 - 126 U/L   Total Bilirubin 0.4 0.3 - 1.2 mg/dL   GFR calc non Af Amer >60 >60 mL/min   GFR calc Af Amer >60 >60 mL/min    Comment: (NOTE) The eGFR has been calculated using the CKD EPI equation. This calculation has not been validated in all clinical situations. eGFR's persistently <60 mL/min signify possible Chronic Kidney Disease.    Anion gap 3 (L) 5 - 15      PHQ2/9: Depression screen [facility] 2/9 05/12/2016 04/07/2016 03/12/2016 02/04/2016 09/20/2015  Decreased Interest 0 0 0 0 0  Down, Depressed, Hopeless 0 0 0 0 0  PHQ - 2 Score 0 0 0 0 0  Altered sleeping - - - - -  Tired, decreased energy - - - - -  Change in appetite - - - - -  Feeling bad or failure about yourself  - - - - -  Trouble concentrating - - - - -  Moving slowly or fidgety/restless - - - - -  Suicidal thoughts - - - - -  PHQ-9 Score - - - - -  Difficult doing work/chores - - - - -     Fall Risk: Fall Risk  05/12/2016 04/07/2016 03/12/2016 02/04/2016 09/20/2015  Falls in the past year? No No No Yes Yes  Number falls in past yr: - - - 2 or more 1  Injury with Fall? - - No No No      Functional Status Survey: Is the patient deaf or have difficulty hearing?: No Does the patient have difficulty seeing, even when wearing glasses/contacts?: No Does the patient have difficulty concentrating, remembering, or making decisions?: No Does the patient have difficulty walking or climbing stairs?: No Does the patient have difficulty dressing or bathing?: No Does the patient have difficulty doing errands alone such as visiting a doctor's office or shopping?: No    Assessment & Plan  1. Benign essential HTN  Well  controlled, gave her reassurance - side effects not likely from medication   2. Major depression, recurrent, chronic (HCC)  - ALPRAZolam (XANAX) 0.5 MG tablet; Take 1 tablet (0.5 mg total) by mouth 2 (two) times daily as needed for anxiety.  Dispense: 30 tablet; Refill: 0  3. Fibromyalgia syndrome  stable  4. Sleep apnea  Continue CPAP   5. Iron deficiency anemia  Continue follow up with Dr. Jeb Levering  6. Gastro-esophageal reflux disease without esophagitis  Well controlled  7. Migraine without aura and without status migrainosus, not intractable  Doing well at this time  8. Memory loss  Advised neurological evaluation - but she wants to hold off for now  9. Smell or taste sensation disturbance  Advised to use Neti pot, but if no improvement needs to see neurologist

## 2016-05-22 ENCOUNTER — Other Ambulatory Visit: Payer: Self-pay | Admitting: Family Medicine

## 2016-05-23 NOTE — Telephone Encounter (Signed)
Patient requesting refill. 

## 2016-06-09 ENCOUNTER — Encounter: Payer: Self-pay | Admitting: Family Medicine

## 2016-06-09 ENCOUNTER — Other Ambulatory Visit: Payer: Self-pay | Admitting: Family Medicine

## 2016-06-09 DIAGNOSIS — M797 Fibromyalgia: Secondary | ICD-10-CM

## 2016-06-09 MED ORDER — HYDROCODONE-ACETAMINOPHEN 10-325 MG PO TABS: 1.0000 | ORAL_TABLET | Freq: Three times a day (TID) | ORAL | Status: DC | PRN

## 2016-07-08 ENCOUNTER — Encounter: Payer: Self-pay | Admitting: Family Medicine

## 2016-07-08 ENCOUNTER — Other Ambulatory Visit: Payer: Self-pay

## 2016-07-08 ENCOUNTER — Other Ambulatory Visit: Payer: Self-pay | Admitting: Family Medicine

## 2016-07-08 ENCOUNTER — Ambulatory Visit (INDEPENDENT_AMBULATORY_CARE_PROVIDER_SITE_OTHER): Payer: BLUE CROSS/BLUE SHIELD | Admitting: Family Medicine

## 2016-07-08 VITALS — BP 158/96 | HR 83 | Temp 98.2°F | Resp 16 | Ht 61.0 in | Wt 191.4 lb

## 2016-07-08 DIAGNOSIS — F339 Major depressive disorder, recurrent, unspecified: Secondary | ICD-10-CM

## 2016-07-08 DIAGNOSIS — R739 Hyperglycemia, unspecified: Secondary | ICD-10-CM

## 2016-07-08 DIAGNOSIS — I10 Essential (primary) hypertension: Secondary | ICD-10-CM | POA: Diagnosis not present

## 2016-07-08 DIAGNOSIS — R600 Localized edema: Secondary | ICD-10-CM

## 2016-07-08 DIAGNOSIS — M797 Fibromyalgia: Secondary | ICD-10-CM

## 2016-07-08 DIAGNOSIS — E785 Hyperlipidemia, unspecified: Secondary | ICD-10-CM

## 2016-07-08 DIAGNOSIS — Z124 Encounter for screening for malignant neoplasm of cervix: Secondary | ICD-10-CM

## 2016-07-08 DIAGNOSIS — Z0000 Encounter for general adult medical examination without abnormal findings: Secondary | ICD-10-CM | POA: Diagnosis not present

## 2016-07-08 DIAGNOSIS — C50412 Malignant neoplasm of upper-outer quadrant of left female breast: Secondary | ICD-10-CM

## 2016-07-08 MED ORDER — BUPROPION HCL ER (XL) 300 MG PO TB24: 300.0000 mg | ORAL_TABLET | Freq: Every day | ORAL | 5 refills | Status: DC

## 2016-07-08 MED ORDER — VALSARTAN-HYDROCHLOROTHIAZIDE 320-25 MG PO TABS: 1.0000 | ORAL_TABLET | Freq: Every day | ORAL | 0 refills | Status: DC

## 2016-07-08 MED ORDER — ONDANSETRON HCL 4 MG PO TABS: ORAL_TABLET | ORAL | 0 refills | Status: DC

## 2016-07-08 MED ORDER — PAROXETINE HCL 40 MG PO TABS: 40.0000 mg | ORAL_TABLET | ORAL | 5 refills | Status: DC

## 2016-07-08 MED ORDER — FERROUS GLUCONATE 324 (38 FE) MG PO TABS: 324.0000 mg | ORAL_TABLET | Freq: Every day | ORAL | 5 refills | Status: DC

## 2016-07-08 NOTE — Progress Notes (Signed)
Name: Deanna Baker   MRN: [id number]    DOB: 08/15/1961   Date:07/08/2016       Progress Note  Subjective  Chief Complaint  Chief Complaint  Patient presents with  . Annual Exam    HPI  Functional ability/safety issues: she has chronic fatigue and FMS, unable to do repetitive activity, needs to sit down while cooking, difficulty cleaning her house Hearing issues: Addressed - we will check hearing today Activities of daily living: Discussed - see above Home safety issues: No  Issues  End Of Life Planning: Offered verbal information regarding advanced directives, healthcare power of attorney.  Preventative care, Health maintenance, Preventative health measures discussed.  Preventative screenings discussed today: lab work, colonoscopy,  mammogram, DEXA.  Low Dose CT Chest recommended if Age 25-80 years, 30 pack-year currently smoking OR have quit w/in 15years.   Lifestyle risk factor issued reviewed: Diet, exercise, weight management, advised patient smoking is not healthy, nutrition/diet.  Preventative health measures discussed (5-10 year plan).  Reviewed and recommended vaccinations: - Pneumovax  - Prevnar  - Annual Influenza - Zostavax - Tdap   Depression screening: Done Fall risk screening: Done Discuss ADLs/IADLs: Done  Current medical providers: See HPI  Other health risk factors identified this visit: No other issues Cognitive impairment issues: mental fogginess from [place]  All above discussed with patient. Appropriate education, counseling and referral will be made based upon the above.   HTN: she stopped taking Azor because of swelling on both ankles and feet, she states swelling not as bad. She denies SOB or orthopnea. She gained 11 lbs in the past 2 months. No chest pain or palpitation  Patient Active Problem List   Diagnosis Date Noted  . Memory loss 05/12/2016  . Smell or taste sensation disturbance 05/12/2016  . Personal history of colonic polyps   .  Benign neoplasm of transverse colon   . Rectal polyp   . Tenosynovitis of thumb 03/12/2016  . Bilateral knee pain 03/12/2016  . Reflux esophagitis   . Stricture and stenosis of esophagus   . Abnormal findings-gastrointestinal tract   . Thickening of esophagus 08/03/2015  . Choking 07/04/2015  . Benign essential HTN 05/01/2015  . CFIDS (chronic fatigue and immune dysfunction syndrome) 05/01/2015  . Major depression, recurrent, chronic ([place]) 05/01/2015  . Dyslipidemia 05/01/2015  . Fibromyalgia syndrome 05/01/2015  . Blood glucose elevated 05/01/2015  . Eczema intertrigo 05/01/2015  . Migraine without aura and without status migrainosus, not intractable 05/01/2015  . Excessive urination at night 05/01/2015  . Dysmetabolic syndrome [id number]  . History of colonoscopy with polypectomy 05/01/2015  . Central sleep apnea 05/01/2015  . History of back surgery 05/01/2015  . Thickened nails 05/01/2015  . Malignant neoplasm of upper-outer quadrant of female breast, triple negative 06/13/2013    Past Surgical History:  Procedure Laterality Date  . BACK SURGERY    . BREAST LUMPECTOMY Left 12/06/12    lumpectomy with SN biopsy and power port placement...  . COLONOSCOPY  2013   Dr. Suzette Battiest   . COLONOSCOPY WITH PROPOFOL N/A 04/28/2016   Procedure: COLONOSCOPY WITH PROPOFOL;  Surgeon: Lucilla Lame, MD;  Location: [place];  Service: Endoscopy;  Laterality: N/A;  CPAP  . ESOPHAGOGASTRODUODENOSCOPY (EGD) WITH PROPOFOL N/A 10/23/2015   Procedure: ESOPHAGOGASTRODUODENOSCOPY (EGD) WITH PROPOFOL;  Surgeon: Lucilla Lame, MD;  Location: [facility];  Service: Endoscopy;  Laterality: N/A;  . POLYPECTOMY  04/28/2016   Procedure: POLYPECTOMY;  Surgeon: Lucilla Lame, MD;  Location: [place];  Service: Endoscopy;;  . SHOULDER SURGERY Left 2009  . TONSILLECTOMY  1965  . TUBAL LIGATION  1982  . UPPER GI ENDOSCOPY     Dr. Suzette Battiest    Family History  Problem Relation Age of Onset   . Breast cancer Mother     Social History   Social History  . Marital status: Married    Spouse name: N/A  . Number of children: N/A  . Years of education: N/A   Occupational History  . Not on file.   Social History Main Topics  . Smoking status: Former Smoker    Packs/day: 1.00    Years: 20.00    Types: Cigarettes    Quit date: 11/24/1980  . Smokeless tobacco: Never Used  . Alcohol use 0.0 oz/week     Comment: rarely, 1-2x/mo  . Drug use: No  . Sexual activity: Yes    Partners: Male   Other Topics Concern  . Not on file   Social History Narrative  . No narrative on file     Current Outpatient Prescriptions:  .  ALPRAZolam (XANAX) 0.5 MG tablet, Take 1 tablet (0.5 mg total) by mouth 2 (two) times daily as needed for anxiety., Disp: 30 tablet, Rfl: 0 .  buPROPion (WELLBUTRIN XL) 300 MG 24 hr tablet, Take 1 tablet (300 mg total) by mouth daily., Disp: 30 tablet, Rfl: 5 .  docusate sodium (COLACE) 100 MG capsule, Take 100 mg by mouth 2 (two) times daily., Disp: , Rfl:  .  Elastic Bandages & Supports (THUMB BRACE) MISC, 1 Units by Does not apply route daily., Disp: 1 each, Rfl: 0 .  EPIPEN 2-PAK 0.3 MG/0.3ML SOAJ injection, Reported on 04/28/2016, Disp: , Rfl: 1 .  ferrous gluconate (FERGON) 324 MG tablet, Take 1 tablet (324 mg total) by mouth daily with breakfast., Disp: 30 tablet, Rfl: 3 .  gabapentin (NEURONTIN) 400 MG capsule, Take 1 capsule (400 mg total) by mouth 3 (three) times daily., Disp: 90 capsule, Rfl: 5 .  HYDROcodone-acetaminophen (NORCO) 10-325 MG tablet, Take 1 tablet by mouth 3 (three) times daily as needed., Disp: 30 tablet, Rfl: 0 .  meloxicam (MOBIC) 15 MG tablet, Take 1 tablet (15 mg total) by mouth daily., Disp: 30 tablet, Rfl: 2 .  Multiple Vitamins-Minerals (CENTRUM SILVER ADULT 50+ PO), Take by mouth., Disp: , Rfl:  .  Nerve Stimulator (STANDARD TENS) DEVI, 1 Units by Does not apply route daily., Disp: 1 Device, Rfl: 0 .  omeprazole (PRILOSEC) 40 MG  capsule, TAKE 1 CAPSULE (40 MG TOTAL) BY MOUTH 2 (TWO) TIMES DAILY., Disp: 60 capsule, Rfl: 2 .  ondansetron (ZOFRAN) 4 MG tablet, TAKE 1 TABLET EVERY 8 HOURS AS NEEDED FOR NAUSEA AND VOMITING, Disp: 20 tablet, Rfl: 0 .  PARoxetine (PAXIL) 40 MG tablet, Take 1 tablet (40 mg total) by mouth every morning., Disp: 30 tablet, Rfl: 5 .  traMADol (ULTRAM) 50 MG tablet, Take 1 tablet (50 mg total) by mouth every 6 (six) hours as needed., Disp: 60 tablet, Rfl: 2 .  valsartan-hydrochlorothiazide (DIOVAN-HCT) 320-25 MG tablet, Take 1 tablet by mouth daily., Disp: 30 tablet, Rfl: 0  Allergies  Allergen Reactions  . Ace Inhibitors Swelling  . Pregabalin     weight gain and blurred vision  . Venlafaxine     foggy minded     ROS  Constitutional: Negative for fever, positive for  weight change.  Respiratory: Negative for cough and shortness of breath.   Cardiovascular: Negative for chest pain  or palpitations.  Gastrointestinal: Negative for abdominal pain, no bowel changes.  Musculoskeletal: Negative for gait problem, positive  joint swelling on right thumb.  Skin: Negative for rash.  Neurological: Negative for dizziness or headache.  No other specific complaints in a complete review of systems (except as listed in HPI above).  Objective  Vitals:   07/08/16 1132  BP: (!) 158/96  Pulse: 83  Resp: 16  Temp: 98.2 F (36.8 C)  TempSrc: Oral  SpO2: 97%  Weight: 191 lb 6.4 oz (86.8 kg)  Height: 5\' 1"  (1.549 m)    Body mass index is 36.16 kg/m.  Physical Exam  Constitutional: Patient appears well-developed and well-nourished. No distress.  HENT: Head: Normocephalic and atraumatic. Ears: B TMs ok, no erythema or effusion; Nose: Nose normal. Mouth/Throat: Oropharynx is clear and moist. No oropharyngeal exudate.  Eyes: Conjunctivae and EOM are normal. Pupils are equal, round, and reactive to light. No scleral icterus.  Neck: Normal range of motion. Neck supple. No JVD present. No thyromegaly  present.  Cardiovascular: Normal rate, regular rhythm and normal heart sounds.  No murmur heard. No BLE edema. Pulmonary/Chest: Effort normal and breath sounds normal. No respiratory distress. Abdominal: Soft. Bowel sounds are normal, no distension. There is no tenderness. no masses Breast: no lumps or masses, no nipple discharge or rashes FEMALE GENITALIA:  External genitalia normal External urethra normal Vaginal vault normal without discharge or lesions Cervix normal without discharge or lesions Bimanual exam normal without masses RECTAL: not done Musculoskeletal: Normal range of motion, no joint effusions. Right thumb tenderness, no effusion, trigger point positive  Neurological: he is alert and oriented to person, place, and time. No cranial nerve deficit. Coordination, balance, strength, speech and gait are normal.  Skin: Skin is warm and dry. No rash noted. No erythema.  Psychiatric: Patient has a normal mood and affect. behavior is normal. Judgment and thought content normal.   PHQ2/9: Depression screen [facility] 2/9 05/12/2016 04/07/2016 03/12/2016 02/04/2016 09/20/2015  Decreased Interest 0 0 0 0 0  Down, Depressed, Hopeless 0 0 0 0 0  PHQ - 2 Score 0 0 0 0 0  Altered sleeping - - - - -  Tired, decreased energy - - - - -  Change in appetite - - - - -  Feeling bad or failure about yourself  - - - - -  Trouble concentrating - - - - -  Moving slowly or fidgety/restless - - - - -  Suicidal thoughts - - - - -  PHQ-9 Score - - - - -  Difficult doing work/chores - - - - -     Fall Risk: Fall Risk  05/12/2016 04/07/2016 03/12/2016 02/04/2016 09/20/2015  Falls in the past year? No No No Yes Yes  Number falls in past yr: - - - 2 or more 1  Injury with Fall? - - No No No      Functional Status Survey: Is the patient deaf or have difficulty hearing?: Yes Does the patient have difficulty seeing, even when wearing glasses/contacts?: No Does the patient have difficulty concentrating,  remembering, or making decisions?: Yes (FMS) Does the patient have difficulty walking or climbing stairs?: Yes (sometimes) Does the patient have difficulty dressing or bathing?: No Does the patient have difficulty doing errands alone such as visiting a doctor's office or shopping?: No   Assessment & Plan  1. Medicare annual wellness visit, subsequent  Discussed importance of 150 minutes of physical activity weekly, eat two servings of fish  weekly, eat one serving of tree nuts ( cashews, pistachios, pecans, almonds.Marland Kitchen) every other day, eat 6 servings of fruit/vegetables daily and drink plenty of water and avoid sweet beverages.    2. Benign essential HTN  - valsartan-hydrochlorothiazide (DIOVAN-HCT) 320-25 MG tablet; Take 1 tablet by mouth daily.  Dispense: 30 tablet; Refill: 0 - Comprehensive metabolic panel  3. Bilateral edema of lower extremity  - valsartan-hydrochlorothiazide (DIOVAN-HCT) 320-25 MG tablet; Take 1 tablet by mouth daily.  Dispense: 30 tablet; Refill: 0 - Comprehensive metabolic panel  4. Cervical cancer screening  - PapLb, HPV, rfx16/18  5. Dyslipidemia  - Lipid panel  6. Hyperglycemia  - Hemoglobin A1c

## 2016-07-08 NOTE — Telephone Encounter (Signed)
Patient was in for her CPE and forgot to mention she needs refills on her maintenance medications. She states the controlled medications she would picked up tomorrow when coming for her blood work. She needs refills on Zofran, Hydrocodone, Bupropion, Xanax, Fergon, and Paxil.

## 2016-07-10 ENCOUNTER — Encounter: Payer: Self-pay | Admitting: Family Medicine

## 2016-07-10 DIAGNOSIS — IMO0002 Reserved for concepts with insufficient information to code with codable children: Secondary | ICD-10-CM | POA: Insufficient documentation

## 2016-07-10 LAB — LIPID PANEL
CHOL/HDL RATIO: 3.6 ratio (ref 0.0–4.4)
CHOLESTEROL TOTAL: 212 mg/dL — AB (ref 100–199)
HDL: 59 mg/dL (ref >39)
LDL CALC: 131 mg/dL — AB (ref 0–99)
TRIGLYCERIDES: 109 mg/dL (ref 0–149)
VLDL Cholesterol Cal: 22 mg/dL (ref 5–40)

## 2016-07-10 LAB — COMPREHENSIVE METABOLIC PANEL
ALBUMIN: 4.2 g/dL (ref 3.5–5.5)
ALK PHOS: 112 IU/L (ref 39–117)
ALT: 19 IU/L (ref 0–32)
AST: 22 IU/L (ref 0–40)
Albumin/Globulin Ratio: 2 (ref 1.2–2.2)
BILIRUBIN TOTAL: 0.3 mg/dL (ref 0.0–1.2)
BUN / CREAT RATIO: 13 (ref 9–23)
BUN: 15 mg/dL (ref 6–24)
CO2: 26 mmol/L (ref 18–29)
CREATININE: 1.15 mg/dL — AB (ref 0.57–1.00)
Calcium: 10 mg/dL (ref 8.7–10.2)
Chloride: 102 mmol/L (ref 96–106)
GFR calc non Af Amer: 54 mL/min/{1.73_m2} — ABNORMAL LOW (ref >59)
GFR, EST AFRICAN AMERICAN: 62 mL/min/{1.73_m2} (ref >59)
GLOBULIN, TOTAL: 2.1 g/dL (ref 1.5–4.5)
GLUCOSE: 102 mg/dL — AB (ref 65–99)
Potassium: 4.9 mmol/L (ref 3.5–5.2)
SODIUM: 144 mmol/L (ref 134–144)
TOTAL PROTEIN: 6.3 g/dL (ref 6.0–8.5)

## 2016-07-10 LAB — PAPLB, HPV, RFX16/18
HPV, HIGH-RISK: NEGATIVE
PAP Smear Comment: 0

## 2016-07-10 LAB — HEMOGLOBIN A1C
Est. average glucose Bld gHb Est-mCnc: 111 mg/dL
Hgb A1c MFr Bld: 5.5 % (ref 4.8–5.6)

## 2016-07-11 ENCOUNTER — Ambulatory Visit (INDEPENDENT_AMBULATORY_CARE_PROVIDER_SITE_OTHER): Payer: BLUE CROSS/BLUE SHIELD | Admitting: Family Medicine

## 2016-07-11 ENCOUNTER — Encounter: Payer: Self-pay | Admitting: Family Medicine

## 2016-07-11 VITALS — BP 124/82 | HR 84 | Temp 98.2°F | Resp 18 | Ht 61.0 in | Wt 185.6 lb

## 2016-07-11 DIAGNOSIS — G4700 Insomnia, unspecified: Secondary | ICD-10-CM | POA: Diagnosis not present

## 2016-07-11 DIAGNOSIS — R600 Localized edema: Secondary | ICD-10-CM

## 2016-07-11 DIAGNOSIS — R5382 Chronic fatigue, unspecified: Secondary | ICD-10-CM | POA: Diagnosis not present

## 2016-07-11 DIAGNOSIS — F339 Major depressive disorder, recurrent, unspecified: Secondary | ICD-10-CM | POA: Diagnosis not present

## 2016-07-11 DIAGNOSIS — IMO0002 Reserved for concepts with insufficient information to code with codable children: Secondary | ICD-10-CM

## 2016-07-11 DIAGNOSIS — I10 Essential (primary) hypertension: Secondary | ICD-10-CM | POA: Diagnosis not present

## 2016-07-11 DIAGNOSIS — R896 Abnormal cytological findings in specimens from other organs, systems and tissues: Secondary | ICD-10-CM | POA: Diagnosis not present

## 2016-07-11 DIAGNOSIS — G9332 Myalgic encephalomyelitis/chronic fatigue syndrome: Secondary | ICD-10-CM

## 2016-07-11 DIAGNOSIS — G4730 Sleep apnea, unspecified: Secondary | ICD-10-CM | POA: Diagnosis not present

## 2016-07-11 DIAGNOSIS — G4733 Obstructive sleep apnea (adult) (pediatric): Secondary | ICD-10-CM | POA: Insufficient documentation

## 2016-07-11 DIAGNOSIS — D8989 Other specified disorders involving the immune mechanism, not elsewhere classified: Secondary | ICD-10-CM

## 2016-07-11 DIAGNOSIS — M797 Fibromyalgia: Secondary | ICD-10-CM

## 2016-07-11 DIAGNOSIS — K219 Gastro-esophageal reflux disease without esophagitis: Secondary | ICD-10-CM

## 2016-07-11 MED ORDER — HYDROCODONE-ACETAMINOPHEN 10-325 MG PO TABS: 1.0000 | ORAL_TABLET | Freq: Three times a day (TID) | ORAL | 0 refills | Status: DC | PRN

## 2016-07-11 MED ORDER — ALPRAZOLAM 0.5 MG PO TABS: 0.5000 mg | ORAL_TABLET | Freq: Two times a day (BID) | ORAL | 0 refills | Status: DC | PRN

## 2016-07-11 NOTE — Progress Notes (Signed)
Name: Deanna Baker   MRN: [id number]    DOB: 11/16/1961   Date:07/11/2016       Progress Note  Subjective  Chief Complaint  Chief Complaint  Patient presents with  . Medication Refill    HPI  HTN: She is now off Lotrel and on Valsartan HCTZ and has lost 5 lbs in the past few days, and swelling is going down on her legs. She denies side effects of medication   FMS: she continues to have daily pain, taking pain medications :gabapentin, and antidepressant, prn Tramadol and when the pain is so intense Hydrodocone. She has daily mental fogginess, trigger point pains.   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 Post-activity fatigue is severe. Off stimulants, but does not want to go back on medication   Reflux esophagitis: choking and also esophageal thickenes on CT chest/abdomen. Seen by Dr. Durwin Reges she had an EGD and esophageal dilation, and was diagnosed with reflux esophagitis and advised to increase PPI to twice daily. She has noticed an improvement of her symptoms since she increased dose of PPI, she still has symptoms of heartburn, discussed possible side effects of medication and we decided not to wean it off at this time because of esophagitis.   Major Depression: still feels tired - she also has chronic fatigue syndrome taking medication as prescribed. Occasionally has down days usually related to FMS and CFIDS. Her friend Fraser Din is living with them and is busier than usual going out shopping.   ASCUS: discussed negative HPV and recheck in one year  OSA: she is wearing CPAP every night, all night, she has noticed a little more energy in am's. It has controlled her regurgitation .  Patient Active Problem List   Diagnosis Date Noted  . ASCUS favor benign 07/10/2016  . Memory loss 05/12/2016  . Smell or taste sensation disturbance 05/12/2016  . Personal history of colonic polyps   . Benign neoplasm of transverse colon   . Rectal polyp    . Tenosynovitis of thumb 03/12/2016  . Bilateral knee pain 03/12/2016  . Reflux esophagitis   . Stricture and stenosis of esophagus   . Abnormal findings-gastrointestinal tract   . Thickening of esophagus 08/03/2015  . Choking 07/04/2015  . Benign essential HTN 05/01/2015  . CFIDS (chronic fatigue and immune dysfunction syndrome) 05/01/2015  . Major depression, recurrent, chronic ([place]) 05/01/2015  . Dyslipidemia 05/01/2015  . Fibromyalgia syndrome 05/01/2015  . Blood glucose elevated 05/01/2015  . Eczema intertrigo 05/01/2015  . Migraine without aura and without status migrainosus, not intractable 05/01/2015  . Excessive urination at night 05/01/2015  . Dysmetabolic syndrome [id number]  . History of colonoscopy with polypectomy 05/01/2015  . Central sleep apnea 05/01/2015  . History of back surgery 05/01/2015  . Thickened nails 05/01/2015  . Malignant neoplasm of upper-outer quadrant of female breast, triple negative 06/13/2013    Past Surgical History:  Procedure Laterality Date  . BACK SURGERY    . BREAST LUMPECTOMY Left 12/06/12    lumpectomy with SN biopsy and power port placement...  . COLONOSCOPY  2013   Dr. Suzette Battiest   . COLONOSCOPY WITH PROPOFOL N/A 04/28/2016   Procedure: COLONOSCOPY WITH PROPOFOL;  Surgeon: Lucilla Lame, MD;  Location: [place];  Service: Endoscopy;  Laterality: N/A;  CPAP  . ESOPHAGOGASTRODUODENOSCOPY (EGD) WITH PROPOFOL N/A 10/23/2015   Procedure: ESOPHAGOGASTRODUODENOSCOPY (EGD) WITH PROPOFOL;  Surgeon: Lucilla Lame, MD;  Location: [facility];  Service:  Endoscopy;  Laterality: N/A;  . POLYPECTOMY  04/28/2016   Procedure: POLYPECTOMY;  Surgeon: Lucilla Lame, MD;  Location: [place];  Service: Endoscopy;;  . SHOULDER SURGERY Left 2009  . TONSILLECTOMY  1965  . TUBAL LIGATION  1982  . UPPER GI ENDOSCOPY     Dr. Suzette Battiest    Family History  Problem Relation Age of Onset  . Breast cancer Mother     Social History   Social  History  . Marital status: Married    Spouse name: N/A  . Number of children: N/A  . Years of education: N/A   Occupational History  . Not on file.   Social History Main Topics  . Smoking status: Former Smoker    Packs/day: 1.00    Years: 20.00    Types: Cigarettes    Quit date: 11/24/1980  . Smokeless tobacco: Never Used  . Alcohol use 0.0 oz/week     Comment: rarely, 1-2x/mo  . Drug use: No  . Sexual activity: Yes    Partners: Male   Other Topics Concern  . Not on file   Social History Narrative  . No narrative on file     Current Outpatient Prescriptions:  .  ALPRAZolam (XANAX) 0.5 MG tablet, Take 1 tablet (0.5 mg total) by mouth 2 (two) times daily as needed for anxiety., Disp: 45 tablet, Rfl: 0 .  buPROPion (WELLBUTRIN XL) 300 MG 24 hr tablet, Take 1 tablet (300 mg total) by mouth daily., Disp: 30 tablet, Rfl: 5 .  docusate sodium (COLACE) 100 MG capsule, Take 100 mg by mouth 2 (two) times daily., Disp: , Rfl:  .  Elastic Bandages & Supports (THUMB BRACE) MISC, 1 Units by Does not apply route daily., Disp: 1 each, Rfl: 0 .  EPIPEN 2-PAK 0.3 MG/0.3ML SOAJ injection, Reported on 04/28/2016, Disp: , Rfl: 1 .  ferrous gluconate (FERGON) 324 MG tablet, Take 1 tablet (324 mg total) by mouth daily with breakfast., Disp: 30 tablet, Rfl: 5 .  gabapentin (NEURONTIN) 400 MG capsule, Take 1 capsule (400 mg total) by mouth 3 (three) times daily., Disp: 90 capsule, Rfl: 5 .  HYDROcodone-acetaminophen (NORCO) 10-325 MG tablet, Take 1 tablet by mouth 3 (three) times daily as needed. If patient does not respond to Tramadol To last 3 months, Disp: 45 tablet, Rfl: 0 .  meloxicam (MOBIC) 15 MG tablet, Take 1 tablet (15 mg total) by mouth daily., Disp: 30 tablet, Rfl: 2 .  Multiple Vitamins-Minerals (CENTRUM SILVER ADULT 50+ PO), Take by mouth., Disp: , Rfl:  .  Nerve Stimulator (STANDARD TENS) DEVI, 1 Units by Does not apply route daily., Disp: 1 Device, Rfl: 0 .  omeprazole (PRILOSEC) 40 MG  capsule, TAKE 1 CAPSULE (40 MG TOTAL) BY MOUTH 2 (TWO) TIMES DAILY., Disp: 60 capsule, Rfl: 2 .  ondansetron (ZOFRAN) 4 MG tablet, TAKE 1 TABLET EVERY 8 HOURS AS NEEDED FOR NAUSEA AND VOMITING, Disp: 20 tablet, Rfl: 0 .  PARoxetine (PAXIL) 40 MG tablet, Take 1 tablet (40 mg total) by mouth every morning., Disp: 30 tablet, Rfl: 5 .  traMADol (ULTRAM) 50 MG tablet, Take 1 tablet (50 mg total) by mouth every 6 (six) hours as needed., Disp: 60 tablet, Rfl: 2 .  valsartan-hydrochlorothiazide (DIOVAN-HCT) 320-25 MG tablet, Take 1 tablet by mouth daily., Disp: 30 tablet, Rfl: 0  Allergies  Allergen Reactions  . Ace Inhibitors Swelling  . Pregabalin     weight gain and blurred vision  . Venlafaxine  foggy minded     ROS  Ten systems reviewed and is negative except as mentioned in HPI   Objective  Vitals:   07/11/16 1442  BP: 124/82  Pulse: 84  Resp: 18  Temp: 98.2 F (36.8 C)  SpO2: 95%  Weight: 185 lb 9 oz (84.2 kg)  Height: 5\' 1"  (1.549 m)    Body mass index is 35.06 kg/m.  Physical Exam   Constitutional: Patient appears well-developed and well-nourished. Obese No distress.  HEENT: head atraumatic, normocephalic, pupils equal and reactive to light, neck supple, throat within normal limits Cardiovascular: Normal rate, regular rhythm and normal heart sounds. No murmur heard. No BLE edema Pulmonary/Chest: Effort normal and breath sounds normal. No respiratory distress. Abdominal: Soft. There is no tenderness. Psychiatric: Patient has a normal mood and affect. behavior is normal. Judgment and thought content normal. Muscular Skeletal: positive trigger points throughout.    Recent Results (from the past 2160 hour(s))  PapLb, HPV, rfx16/18     Status: Abnormal   Collection Time: 07/08/16 12:31 PM  Result Value Ref Range   DIAGNOSIS: Comment (A)     Comment: EPITHELIAL CELL ABNORMALITY. ATYPICAL SQUAMOUS CELLS OF UNDETERMINED SIGNIFICANCE.    Specimen adequacy:  Comment     Comment: Satisfactory for evaluation. Endocervical and/or squamous metaplastic cells (endocervical component) are present.    CLINICIAN PROVIDED ICD10: Comment     Comment: Z12.4   Performed by: Comment     Comment: Dimitria Harding, Cytotechnologist (ASCP)   Electronically signed by: Comment     Comment: Franco Collet, MD, Pathologist   PAP SMEAR COMMENT .    PATHOLOGIST PROVIDED ICD10: Comment     Comment: R87.610   Note: Comment     Comment: The Pap smear is a screening test designed to aid in the detection of premalignant and malignant conditions of the uterine cervix.  It is not a diagnostic procedure and should not be used as the sole means of detecting cervical cancer.  Both false-positive and false-negative reports do occur.    HPV, high-risk Negative Negative    Comment: This high-risk HPV test detects thirteen high-risk types (16/18/31/33/35/39/45/51/52/56/58/59/68) without differentiation.   Lipid panel     Status: Abnormal   Collection Time: 07/09/16 10:06 AM  Result Value Ref Range   Cholesterol, Total 212 (H) 100 - 199 mg/dL   Triglycerides 109 0 - 149 mg/dL   HDL 59 >39 mg/dL   VLDL Cholesterol Cal 22 5 - 40 mg/dL   LDL Calculated 131 (H) 0 - 99 mg/dL   Chol/HDL Ratio 3.6 0.0 - 4.4 ratio units    Comment:                                   T. Chol/HDL Ratio                                             Men  Women                               1/2 Avg.Risk  3.4    3.3  Avg.Risk  5.0    4.4                                2X Avg.Risk  9.6    7.1                                3X Avg.Risk 23.4   11.0   Hemoglobin A1c     Status: None   Collection Time: 07/09/16 10:06 AM  Result Value Ref Range   Hgb A1c MFr Bld 5.5 4.8 - 5.6 %    Comment:          Pre-diabetes: 5.7 - 6.4          Diabetes: >6.4          Glycemic control for adults with diabetes: <7.0    Est. average glucose Bld gHb Est-mCnc 111 mg/dL  Comprehensive  metabolic panel     Status: Abnormal   Collection Time: 07/09/16 10:06 AM  Result Value Ref Range   Glucose 102 (H) 65 - 99 mg/dL   BUN 15 6 - 24 mg/dL   Creatinine, Ser 1.15 (H) 0.57 - 1.00 mg/dL   GFR calc non Af Amer 54 (L) >59 mL/min/1.73   GFR calc Af Amer 62 >59 mL/min/1.73   BUN/Creatinine Ratio 13 9 - 23   Sodium 144 134 - 144 mmol/L   Potassium 4.9 3.5 - 5.2 mmol/L   Chloride 102 96 - 106 mmol/L   CO2 26 18 - 29 mmol/L   Calcium 10.0 8.7 - 10.2 mg/dL   Total Protein 6.3 6.0 - 8.5 g/dL   Albumin 4.2 3.5 - 5.5 g/dL   Globulin, Total 2.1 1.5 - 4.5 g/dL   Albumin/Globulin Ratio 2.0 1.2 - 2.2   Bilirubin Total 0.3 0.0 - 1.2 mg/dL   Alkaline Phosphatase 112 39 - 117 IU/L   AST 22 0 - 40 IU/L   ALT 19 0 - 32 IU/L      PHQ2/9: Depression screen [facility] 2/9 07/11/2016 05/12/2016 04/07/2016 03/12/2016 02/04/2016  Decreased Interest 0 0 0 0 0  Down, Depressed, Hopeless 0 0 0 0 0  PHQ - 2 Score 0 0 0 0 0  Altered sleeping - - - - -  Tired, decreased energy - - - - -  Change in appetite - - - - -  Feeling bad or failure about yourself  - - - - -  Trouble concentrating - - - - -  Moving slowly or fidgety/restless - - - - -  Suicidal thoughts - - - - -  PHQ-9 Score - - - - -  Difficult doing work/chores - - - - -     Fall Risk: Fall Risk  07/11/2016 05/12/2016 04/07/2016 03/12/2016 02/04/2016  Falls in the past year? No No No No Yes  Number falls in past yr: - - - - 2 or more  Injury with Fall? - - - No No    Functional Status Survey: Is the patient deaf or have difficulty hearing?: Yes Does the patient have difficulty seeing, even when wearing glasses/contacts?: No Does the patient have difficulty concentrating, remembering, or making decisions?: Yes Does the patient have difficulty walking or climbing stairs?: Yes Does the patient have difficulty dressing or bathing?: No Does the patient have difficulty doing errands alone such as visiting a doctor's office or shopping?:  No    Assessment & Plan  1. Benign essential HTN  Doing well on Valsartan hctz  2. Bilateral edema of lower extremity  Doing well since started on HCTZ  3. Gastro-esophageal reflux disease without esophagitis  Continue PPI for now  4. CFIDS (chronic fatigue and immune dysfunction syndrome)  stable  5. Fibromyalgia syndrome  - HYDROcodone-acetaminophen (NORCO) 10-325 MG tablet; Take 1 tablet by mouth 3 (three) times daily as needed. If patient does not respond to Tramadol To last 3 months  Dispense: 45 tablet; Refill: 0  6. Major depression, recurrent, chronic ([facility])  - ALPRAZolam (XANAX) 0.5 MG tablet; Take 1 tablet (0.5 mg total) by mouth 2 (two) times daily as needed for anxiety.  Dispense: 45 tablet; Refill: 0  7. Insomnia  Taking medication prn   8. ASCUS favor benign  9. Sleep apnea  Continue CPAP every  night

## 2016-07-30 ENCOUNTER — Other Ambulatory Visit: Payer: Self-pay | Admitting: Family Medicine

## 2016-07-30 DIAGNOSIS — K2100 Gastro-esophageal reflux disease with esophagitis, without bleeding: Secondary | ICD-10-CM

## 2016-07-30 NOTE — Telephone Encounter (Signed)
Patient requesting refill of Omeprazole be sent to [id number].

## 2016-08-01 ENCOUNTER — Other Ambulatory Visit: Payer: Self-pay | Admitting: Family Medicine

## 2016-08-01 DIAGNOSIS — I10 Essential (primary) hypertension: Secondary | ICD-10-CM

## 2016-08-01 DIAGNOSIS — R600 Localized edema: Secondary | ICD-10-CM

## 2016-08-01 NOTE — Telephone Encounter (Signed)
Patient requesting refill of Diovan be sent to [id number].

## 2016-08-12 ENCOUNTER — Ambulatory Visit: Payer: BLUE CROSS/BLUE SHIELD | Admitting: Family Medicine

## 2016-08-26 ENCOUNTER — Ambulatory Visit (INDEPENDENT_AMBULATORY_CARE_PROVIDER_SITE_OTHER): Payer: BLUE CROSS/BLUE SHIELD | Admitting: Gastroenterology

## 2016-08-26 ENCOUNTER — Encounter: Payer: Self-pay | Admitting: Gastroenterology

## 2016-08-26 VITALS — BP 147/79 | HR 77 | Temp 98.1°F | Ht 61.0 in | Wt 186.0 lb

## 2016-08-26 DIAGNOSIS — G8929 Other chronic pain: Secondary | ICD-10-CM | POA: Diagnosis not present

## 2016-08-26 DIAGNOSIS — K219 Gastro-esophageal reflux disease without esophagitis: Secondary | ICD-10-CM | POA: Diagnosis not present

## 2016-08-26 DIAGNOSIS — R1013 Epigastric pain: Secondary | ICD-10-CM | POA: Diagnosis not present

## 2016-08-26 NOTE — Progress Notes (Signed)
Primary Care Physician: Loistine Chance, MD  Primary Gastroenterologist:  Dr. Lucilla Lame  Chief Complaint  Patient presents with  . Gastroesophageal Reflux    HPI: Deanna Baker is a 55 y.o. female here Burning in her stomach. The patient had a history of having an upper endoscopy with a stricture and esophagitis seen. The patient has stopped her PPI. The patient was on omeprazole 40 mg twice a day and states that her reflux and regurgitation has been better since she started sleeping with CPAP but she still has the feeling of food coming up when she lays down. She also reports that she now has burning in her stomach. The patient stopped her PPI because she states she doesn't want to take any more medication since she takes multiple medications for her fibromyalgia and other almonds. The patient has not lost any weight but in fact states she gained weight. She thinks that this is water weight and states that she lost some weight in the past when she change her blood pressure medication. The patient denies any black stools or bloody stools she also denies any further dysphagia.  Current Outpatient Prescriptions  Medication Sig Dispense Refill  . ALPRAZolam (XANAX) 0.5 MG tablet Take 1 tablet (0.5 mg total) by mouth 2 (two) times daily as needed for anxiety. 45 tablet 0  . buPROPion (WELLBUTRIN XL) 300 MG 24 hr tablet Take 1 tablet (300 mg total) by mouth daily. 30 tablet 5  . docusate sodium (COLACE) 100 MG capsule Take 100 mg by mouth 2 (two) times daily.    . Elastic Bandages & Supports (THUMB BRACE) MISC 1 Units by Does not apply route daily. 1 each 0  . EPIPEN 2-PAK 0.3 MG/0.3ML SOAJ injection Reported on 04/28/2016  1  . ferrous gluconate (FERGON) 324 MG tablet Take 1 tablet (324 mg total) by mouth daily with breakfast. 30 tablet 5  . gabapentin (NEURONTIN) 400 MG capsule Take 1 capsule (400 mg total) by mouth 3 (three) times daily. 90 capsule 5  . HYDROcodone-acetaminophen (NORCO) 10-325  MG tablet Take 1 tablet by mouth 3 (three) times daily as needed. If patient does not respond to Tramadol To last 3 months 45 tablet 0  . meloxicam (MOBIC) 15 MG tablet Take 1 tablet (15 mg total) by mouth daily. 30 tablet 2  . Multiple Vitamins-Minerals (CENTRUM SILVER ADULT 50+ PO) Take by mouth.    . Nerve Stimulator (STANDARD TENS) DEVI 1 Units by Does not apply route daily. 1 Device 0  . omeprazole (PRILOSEC) 40 MG capsule TAKE 1 CAPSULE (40 MG TOTAL) BY MOUTH 2 (TWO) TIMES DAILY. 60 capsule 2  . ondansetron (ZOFRAN) 4 MG tablet TAKE 1 TABLET EVERY 8 HOURS AS NEEDED FOR NAUSEA AND VOMITING 20 tablet 0  . PARoxetine (PAXIL) 40 MG tablet Take 1 tablet (40 mg total) by mouth every morning. 30 tablet 5  . traMADol (ULTRAM) 50 MG tablet Take 1 tablet (50 mg total) by mouth every 6 (six) hours as needed. 60 tablet 2  . valsartan-hydrochlorothiazide (DIOVAN-HCT) 320-25 MG tablet TAKE 1 TABLET BY MOUTH DAILY. 30 tablet 0   No current facility-administered medications for this visit.     Allergies as of 08/26/2016 - Review Complete 08/26/2016  Allergen Reaction Noted  . Ace inhibitors Swelling 02/04/2016  . Pregabalin  05/01/2015  . Venlafaxine  05/01/2015    ROS:  General: Negative for anorexia, weight loss, fever, chills, fatigue, weakness. ENT: Negative for hoarseness, difficulty swallowing ,  nasal congestion. CV: Negative for chest pain, angina, palpitations, dyspnea on exertion, peripheral edema.  Respiratory: Negative for dyspnea at rest, dyspnea on exertion, cough, sputum, wheezing.  GI: See history of present illness. GU:  Negative for dysuria, hematuria, urinary incontinence, urinary frequency, nocturnal urination.  Endo: Negative for unusual weight change.    Physical Examination:   BP (!) 147/79   Pulse 77   Temp 98.1 F (36.7 C) (Oral)   Ht 5\' 1"  (1.549 m)   Wt 186 lb (84.4 kg)   BMI 35.14 kg/m   General: Well-nourished, well-developed in no acute distress.  Eyes:  No icterus. Conjunctivae pink. Mouth: Oropharyngeal mucosa moist and pink , no lesions erythema or exudate. Lungs: Clear to auscultation bilaterally. Non-labored. Heart: Regular rate and rhythm, no murmurs rubs or gallops.  Abdomen: Bowel sounds are normal, nontender, nondistended, no hepatosplenomegaly or masses, no abdominal bruits or hernia , no rebound or guarding.   Extremities: No lower extremity edema. No clubbing or deformities. Neuro: Alert and oriented x 3.  Grossly intact. Skin: Warm and dry, no jaundice.   Psych: Alert and cooperative, normal mood and affect.  Labs:    Imaging Studies: No results found.  Assessment and Plan:   Deanna Baker is a 55 y.o. y/o female who comes in today with abdominal discomfort and burning. The patient does not have as much heartburn as she did in the past. The patient now states that she wants to be on the least amount of medication. The patient has been taking Tums and Pepto-Bismol in addition to Alka-Seltzer for her stomach. The patient has been told that Alka-Seltzer has aspirin products in it and she should avoid that. The patient has been told that her choices are antireflux surgery with prior manometry and esophageal pH monitoring or going back on a PPI. The patient would like to start trying a different PPI. The patient will be started on Dexilant 60 mg to be taken in the evening so she has most of her acid suppression at night. The patient has been told that without medication she will have continued heartburn and risk of repeat esophageal strictures and esophagitis. The patient has been explained the plan and agrees with it.   Note: This dictation was prepared with Dragon dictation along with smaller phrase technology. Any transcriptional errors that result from this process are unintentional.

## 2016-09-01 ENCOUNTER — Other Ambulatory Visit: Payer: Self-pay

## 2016-09-01 ENCOUNTER — Telehealth: Payer: Self-pay

## 2016-09-01 MED ORDER — DEXLANSOPRAZOLE 60 MG PO CPDR: 60.0000 mg | DELAYED_RELEASE_CAPSULE | Freq: Every day | ORAL | 6 refills | Status: DC

## 2016-09-01 NOTE — Telephone Encounter (Signed)
Patient was in the office on 08/26/2016. You had sent a prescription for acid reflux to her pharmacy is what she said but the pharmacy has not received it. The patient is unknown of the medication name. Please re send the medication and give the patient a confirmation call .

## 2016-09-02 NOTE — Telephone Encounter (Signed)
Left vm letting pt know [facility] [address] [employer]. Advised to return my call if still not there.

## 2016-09-03 ENCOUNTER — Telehealth: Payer: Self-pay

## 2016-09-03 NOTE — Telephone Encounter (Signed)
Great. Thank you for the information.

## 2016-09-03 NOTE — Telephone Encounter (Signed)
Patient is calling to confirm that she received [facility] voicemail and that it was the correct pharmacy that she sent it to

## 2016-09-03 NOTE — Telephone Encounter (Signed)
Pt returned call and confirmed she received her medication.

## 2016-09-05 ENCOUNTER — Encounter: Payer: Self-pay | Admitting: *Deleted

## 2016-09-06 ENCOUNTER — Other Ambulatory Visit: Payer: Self-pay | Admitting: Family Medicine

## 2016-09-06 DIAGNOSIS — I10 Essential (primary) hypertension: Secondary | ICD-10-CM

## 2016-09-08 ENCOUNTER — Other Ambulatory Visit: Payer: Self-pay | Admitting: Family Medicine

## 2016-09-08 ENCOUNTER — Ambulatory Visit: Payer: BLUE CROSS/BLUE SHIELD | Admitting: Family Medicine

## 2016-09-08 ENCOUNTER — Ambulatory Visit (INDEPENDENT_AMBULATORY_CARE_PROVIDER_SITE_OTHER): Payer: BLUE CROSS/BLUE SHIELD

## 2016-09-08 DIAGNOSIS — Z23 Encounter for immunization: Secondary | ICD-10-CM | POA: Diagnosis not present

## 2016-09-08 DIAGNOSIS — R600 Localized edema: Secondary | ICD-10-CM

## 2016-09-08 DIAGNOSIS — I10 Essential (primary) hypertension: Secondary | ICD-10-CM

## 2016-09-16 ENCOUNTER — Other Ambulatory Visit: Payer: Self-pay | Admitting: *Deleted

## 2016-09-16 DIAGNOSIS — Z853 Personal history of malignant neoplasm of breast: Secondary | ICD-10-CM

## 2016-09-18 ENCOUNTER — Other Ambulatory Visit: Payer: Self-pay | Admitting: *Deleted

## 2016-09-18 ENCOUNTER — Inpatient Hospital Stay (HOSPITAL_BASED_OUTPATIENT_CLINIC_OR_DEPARTMENT_OTHER): Payer: BLUE CROSS/BLUE SHIELD | Admitting: Hematology and Oncology

## 2016-09-18 ENCOUNTER — Encounter: Payer: Self-pay | Admitting: Hematology and Oncology

## 2016-09-18 ENCOUNTER — Inpatient Hospital Stay: Payer: BLUE CROSS/BLUE SHIELD | Attending: Hematology and Oncology

## 2016-09-18 VITALS — BP 107/71 | HR 75 | Temp 97.0°F | Resp 18 | Wt 187.4 lb

## 2016-09-18 DIAGNOSIS — M797 Fibromyalgia: Secondary | ICD-10-CM | POA: Insufficient documentation

## 2016-09-18 DIAGNOSIS — K219 Gastro-esophageal reflux disease without esophagitis: Secondary | ICD-10-CM

## 2016-09-18 DIAGNOSIS — Z853 Personal history of malignant neoplasm of breast: Secondary | ICD-10-CM | POA: Diagnosis not present

## 2016-09-18 DIAGNOSIS — G4733 Obstructive sleep apnea (adult) (pediatric): Secondary | ICD-10-CM

## 2016-09-18 DIAGNOSIS — Z171 Estrogen receptor negative status [ER-]: Secondary | ICD-10-CM

## 2016-09-18 DIAGNOSIS — M1990 Unspecified osteoarthritis, unspecified site: Secondary | ICD-10-CM | POA: Diagnosis not present

## 2016-09-18 DIAGNOSIS — C50412 Malignant neoplasm of upper-outer quadrant of left female breast: Secondary | ICD-10-CM

## 2016-09-18 DIAGNOSIS — Z803 Family history of malignant neoplasm of breast: Secondary | ICD-10-CM

## 2016-09-18 DIAGNOSIS — Z87891 Personal history of nicotine dependence: Secondary | ICD-10-CM | POA: Diagnosis not present

## 2016-09-18 DIAGNOSIS — Y842 Radiological procedure and radiotherapy as the cause of abnormal reaction of the patient, or of later complication, without mention of misadventure at the time of the procedure: Secondary | ICD-10-CM | POA: Diagnosis not present

## 2016-09-18 DIAGNOSIS — Z9221 Personal history of antineoplastic chemotherapy: Secondary | ICD-10-CM | POA: Insufficient documentation

## 2016-09-18 DIAGNOSIS — Z8719 Personal history of other diseases of the digestive system: Secondary | ICD-10-CM | POA: Insufficient documentation

## 2016-09-18 DIAGNOSIS — Z79899 Other long term (current) drug therapy: Secondary | ICD-10-CM | POA: Diagnosis not present

## 2016-09-18 DIAGNOSIS — I10 Essential (primary) hypertension: Secondary | ICD-10-CM | POA: Insufficient documentation

## 2016-09-18 DIAGNOSIS — Z923 Personal history of irradiation: Secondary | ICD-10-CM

## 2016-09-18 DIAGNOSIS — D509 Iron deficiency anemia, unspecified: Secondary | ICD-10-CM

## 2016-09-18 DIAGNOSIS — K2100 Gastro-esophageal reflux disease with esophagitis, without bleeding: Secondary | ICD-10-CM

## 2016-09-18 DIAGNOSIS — D500 Iron deficiency anemia secondary to blood loss (chronic): Secondary | ICD-10-CM

## 2016-09-18 LAB — CBC WITH DIFFERENTIAL/PLATELET
Basophils Absolute: 0 10*3/uL (ref 0–0.1)
Basophils Relative: 1 %
Eosinophils Absolute: 0.1 10*3/uL (ref 0–0.7)
Eosinophils Relative: 2 %
HCT: 35.6 % (ref 35.0–47.0)
Hemoglobin: 12.3 g/dL (ref 12.0–16.0)
Lymphocytes Relative: 17 %
Lymphs Abs: 0.9 10*3/uL — ABNORMAL LOW (ref 1.0–3.6)
MCH: 30.9 pg (ref 26.0–34.0)
MCHC: 34.4 g/dL (ref 32.0–36.0)
MCV: 89.9 fL (ref 80.0–100.0)
Monocytes Absolute: 0.3 10*3/uL (ref 0.2–0.9)
Monocytes Relative: 6 %
Neutro Abs: 4.2 10*3/uL (ref 1.4–6.5)
Neutrophils Relative %: 74 %
Platelets: 193 10*3/uL (ref 150–440)
RBC: 3.96 MIL/uL (ref 3.80–5.20)
RDW: 14.3 % (ref 11.5–14.5)
WBC: 5.6 10*3/uL (ref 3.6–11.0)

## 2016-09-18 LAB — COMPREHENSIVE METABOLIC PANEL
ALT: 23 U/L (ref 14–54)
AST: 25 U/L (ref 15–41)
Albumin: 3.9 g/dL (ref 3.5–5.0)
Alkaline Phosphatase: 119 U/L (ref 38–126)
Anion gap: 9 (ref 5–15)
BUN: 18 mg/dL (ref 6–20)
CO2: 28 mmol/L (ref 22–32)
Calcium: 9.2 mg/dL (ref 8.9–10.3)
Chloride: 98 mmol/L — ABNORMAL LOW (ref 101–111)
Creatinine, Ser: 1.18 mg/dL — ABNORMAL HIGH (ref 0.44–1.00)
GFR calc Af Amer: 59 mL/min — ABNORMAL LOW (ref >60)
GFR calc non Af Amer: 51 mL/min — ABNORMAL LOW (ref >60)
Glucose, Bld: 119 mg/dL — ABNORMAL HIGH (ref 65–99)
Potassium: 4.1 mmol/L (ref 3.5–5.1)
Sodium: 135 mmol/L (ref 135–145)
Total Bilirubin: 0.5 mg/dL (ref 0.3–1.2)
Total Protein: 6.9 g/dL (ref 6.5–8.1)

## 2016-09-18 LAB — IRON AND TIBC
Iron: 61 ug/dL (ref 28–170)
Saturation Ratios: 19 % (ref 10.4–31.8)
TIBC: 318 ug/dL (ref 250–450)
UIBC: 257 ug/dL

## 2016-09-18 LAB — FERRITIN: Ferritin: 39 ng/mL (ref 11–307)

## 2016-09-18 NOTE — Progress Notes (Signed)
Patient states she has gain about 10#.  She saw her PCP who changed her BP medication and added HCTZ.  She was having problems with her ankles swelling but that has resolved since being put on the diuretic.

## 2016-09-18 NOTE — Progress Notes (Addendum)
[facility] day:  09/18/2016  Chief Complaint: Deanna Baker is a 55 y.o. female with stage IA triple negative left breast cancer who is seen for reassessment.  HPI:  She presented with left breast cancer on screening mammogram in 2013.  She underwent left breast biopsy at the 1:30 position on 11/12/2012. Pathology revealed invasive ductal carcinoma and high-grade DCIS.  Tumor was ER, PR and HER-2/neu negative.  She underwent lumpectomy and sentinel lymph node biopsy on 12/06/2012 by Dr. Jamal Collin.  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  Sentinel left nodes were negative.  Pathologic stage was T1cN0M0.   BRCA mutation was negative in 11/2012.  She has a family history of breast cancer (mother at age 44 and maternal aunt).  She received Adriamycin and Cytoxan (AC) 4 from 12/17/2011 - 02/17/2013. She received weekly Taxol 12 from 03/10/2013 - 06/02/2013. She received radiation.    Bilateral diagnostic mammogram on 12/14/2015 revealed expected density and architectural distortion with dystrophic calcifications in the upper outer quadrant of the left breast stable compared to prior studies.  There was left breast skin thickening, stable with post radiation change. There are no suspicious masses, malignant calcifications, sites of nonsurgical architectural distortions or assymmetries in either breast.  She was last seen the medical oncology clinic by Dr.Choksi on 03/18/2016.  At that time she was doing well.  Labs revealed new anemia (hematocrit 30.4) which appeared to be due to iron deficiency.  Last colonoscopy was in 2013.  EGD was performed in 2016.  She has a history of stricture and esophagitis.  She saw Dr. Lucilla Lame on 08/26/2016 for reflux.  Notes indicated that she wanted to be on the least amount of medication. She had been taking Tums and Pepto-Bismol in addition to Alka-Seltzer for her stomach.  She  was told to avoid Alka-Seltzer as it has aspirin products in it.  She was told that her choices were antireflux surgery with prior manometry and esophageal pH monitoring or going back on a PPI. She was started on Dexilant 60 mg to be taken in the evening.  Symptomatically, she is doing well. Dexilant is working.  She is on ferrous sulfate one pill a day. Regarding her diet, she is eating well. She denies any melena or hematochezia.  She denies any breast concerns.  She notes some spots on her arms where she scars and becomes hypopigmented.  She also notes a little place on her head. She has had some weight gain secondary to fluid.  She has fibromyalgia and is on gabapentin 400 mg twice a day.   Past Medical History:  Diagnosis Date  . Anemia   . Arthritis 1990  . Bronchitis 5/17  . Chronic fatigue syndrome   . Colon polyp 2013  . Fibromyalgia 1993  . GERD (gastroesophageal reflux disease)   . Heart murmur   . Hypertension 2012  . Lump or mass in breast   . Malignant neoplasm of upper-outer quadrant of female breast ([facility])    Invasive mammary carcinoma, triple negative  . Measles   . Mumps 1973  . MVP (mitral valve prolapse)   . Obstructive sleep apnea on CPAP 11/27/15  . Shortness of breath dyspnea    chronic fatigue/fibromyalgia    Past Surgical History:  Procedure Laterality Date  . BACK SURGERY    . BREAST LUMPECTOMY Left 12/06/12    lumpectomy with SN biopsy and power port  placement...  . COLONOSCOPY  2013   Dr. Suzette Battiest   . COLONOSCOPY WITH PROPOFOL N/A 04/28/2016   Procedure: COLONOSCOPY WITH PROPOFOL;  Surgeon: Lucilla Lame, MD;  Location: [place];  Service: Endoscopy;  Laterality: N/A;  CPAP  . ESOPHAGOGASTRODUODENOSCOPY (EGD) WITH PROPOFOL N/A 10/23/2015   Procedure: ESOPHAGOGASTRODUODENOSCOPY (EGD) WITH PROPOFOL;  Surgeon: Lucilla Lame, MD;  Location: [facility];  Service: Endoscopy;  Laterality: N/A;  . POLYPECTOMY  04/28/2016   Procedure: POLYPECTOMY;   Surgeon: Lucilla Lame, MD;  Location: [place];  Service: Endoscopy;;  . SHOULDER SURGERY Left 2009  . TONSILLECTOMY  1965  . TUBAL LIGATION  1982  . UPPER GI ENDOSCOPY     Dr. Suzette Battiest    Family History  Problem Relation Age of Onset  . Breast cancer Mother     Social History:  reports that she quit smoking about 35 years ago. Her smoking use included Cigarettes. She has a 20.00 pack-year smoking history. She has never used smokeless tobacco. She reports that she drinks alcohol. She reports that she does not use drugs.  She has a 56 year old healthy daughter.  She lives in [place]. The patient is alone today.  Allergies:  Allergies  Allergen Reactions  . Ace Inhibitors Swelling  . Pregabalin     weight gain and blurred vision  . Venlafaxine     foggy minded    Current Medications: Current Outpatient Prescriptions  Medication Sig Dispense Refill  . ALPRAZolam (XANAX) 0.5 MG tablet Take 1 tablet (0.5 mg total) by mouth 2 (two) times daily as needed for anxiety. 45 tablet 0  . buPROPion (WELLBUTRIN XL) 300 MG 24 hr tablet Take 1 tablet (300 mg total) by mouth daily. 30 tablet 5  . dexlansoprazole (DEXILANT) 60 MG capsule Take 1 capsule (60 mg total) by mouth daily. 30 capsule 6  . docusate sodium (COLACE) 100 MG capsule Take 100 mg by mouth 2 (two) times daily.    Marland Kitchen EPIPEN 2-PAK 0.3 MG/0.3ML SOAJ injection Reported on 04/28/2016  1  . ferrous gluconate (FERGON) 324 MG tablet Take 1 tablet (324 mg total) by mouth daily with breakfast. 30 tablet 5  . gabapentin (NEURONTIN) 400 MG capsule Take 1 capsule (400 mg total) by mouth 3 (three) times daily. 90 capsule 5  . HYDROcodone-acetaminophen (NORCO) 10-325 MG tablet Take 1 tablet by mouth 3 (three) times daily as needed. If patient does not respond to Tramadol To last 3 months 45 tablet 0  . meloxicam (MOBIC) 15 MG tablet Take 1 tablet (15 mg total) by mouth daily. 30 tablet 2  . Nerve Stimulator (STANDARD TENS) DEVI 1 Units by  Does not apply route daily. 1 Device 0  . ondansetron (ZOFRAN) 4 MG tablet TAKE 1 TABLET EVERY 8 HOURS AS NEEDED FOR NAUSEA AND VOMITING 20 tablet 0  . PARoxetine (PAXIL) 40 MG tablet Take 1 tablet (40 mg total) by mouth every morning. 30 tablet 5  . traMADol (ULTRAM) 50 MG tablet Take 1 tablet (50 mg total) by mouth every 6 (six) hours as needed. 60 tablet 2  . valsartan-hydrochlorothiazide (DIOVAN-HCT) 320-25 MG tablet TAKE 1 TABLET EVERY DAY 30 tablet 0  . Elastic Bandages & Supports (THUMB BRACE) MISC 1 Units by Does not apply route daily. (Patient not taking: Reported on 09/18/2016) 1 each 0  . Multiple Vitamins-Minerals (CENTRUM SILVER ADULT 50+ PO) Take by mouth.    Marland Kitchen omeprazole (PRILOSEC) 40 MG capsule TAKE 1 CAPSULE (40 MG TOTAL) BY MOUTH 2 (  TWO) TIMES DAILY. (Patient not taking: Reported on 09/18/2016) 60 capsule 2   No current facility-administered medications for this visit.     Review of Systems:  GENERAL:  Feels "ok".  Active.  No fevers or sweats.  Weight gain secondary to fluid. PERFORMANCE STATUS (ECOG):  1 HEENT:  No visual changes, runny nose, sore throat, mouth sores or tenderness. Lungs: No shortness of breath or cough.  No hemoptysis.  On CPAP. Cardiac:  No chest pain, palpitations, orthopnea, or PND. GI:  No nausea, vomiting, diarrhea, constipation, melena or hematochezia. GU:  No urgency, frequency, dysuria, or hematuria. Musculoskeletal:  Fibromyalgia on Neurontin.  No back pain.  No joint pain.  No muscle tenderness. Extremities:  No pain or swelling. Skin:  Skin lesions pop up and scar.  Spot on head.  No rashes or skin changes. Neuro:  Poor memory.  No headache, numbness or weakness, balance or coordination issues. Endocrine:  No diabetes, thyroid issues, hot flashes or night sweats. Psych:  No mood changes, depression or anxiety. Pain:  No focal pain. Review of systems:  All other systems reviewed and found to be negative.  Physical Exam: Blood pressure  107/71, pulse 75, temperature 97 F (36.1 C), temperature source Tympanic, resp. rate 18, weight 187 lb 6.3 oz (85 kg). GENERAL:  Well developed, well nourished, woman sitting comfortably in the exam room in no acute distress. MENTAL STATUS:  Alert and oriented to person, place and time. HEAD:  Wearing a cap.  Short brown hair.  Normocephalic, atraumatic, face symmetric, no Cushingoid features. EYES:  Pupils equal round and reactive to light and accomodation.  No conjunctivitis or scleral icterus. ENT:  Oropharynx clear without lesion.  Tongue normal. Mucous membranes moist.  RESPIRATORY:  Clear to auscultation without rales, wheezes or rhonchi. CARDIOVASCULAR:  Regular rate and rhythm without murmur, rub or gallop. BREAST:  Right breast without masses, skin changes or nipple discharge.  Left breast without masses, skin changes or nipple discharge.  ABDOMEN:  Soft, non-tender, with active bowel sounds, and no hepatosplenomegaly.  No masses. SKIN:  Small hypopigmented spots on upper extremities.  No rashes, ulcers or lesions. EXTREMITIES: No edema, no skin discoloration or tenderness.  No palpable cords. LYMPH NODES: No palpable cervical, supraclavicular, axillary or inguinal adenopathy  NEUROLOGICAL: Unremarkable. PSYCH:  Appropriate.   Appointment on 09/18/2016  Component Date Value Ref Range Status  . WBC 09/18/2016 5.6  3.6 - 11.0 K/uL Final  . RBC 09/18/2016 3.96  3.80 - 5.20 MIL/uL Final  . Hemoglobin 09/18/2016 12.3  12.0 - 16.0 g/dL Final  . HCT 09/18/2016 35.6  35.0 - 47.0 % Final  . MCV 09/18/2016 89.9  80.0 - 100.0 fL Final  . MCH 09/18/2016 30.9  26.0 - 34.0 pg Final  . MCHC 09/18/2016 34.4  32.0 - 36.0 g/dL Final  . RDW 09/18/2016 14.3  11.5 - 14.5 % Final  . Platelets 09/18/2016 193  150 - 440 K/uL Final  . Neutrophils Relative % 09/18/2016 74  % Final  . Neutro Abs 09/18/2016 4.2  1.4 - 6.5 K/uL Final  . Lymphocytes Relative 09/18/2016 17  % Final  . Lymphs Abs  09/18/2016 0.9* 1.0 - 3.6 K/uL Final  . Monocytes Relative 09/18/2016 6  % Final  . Monocytes Absolute 09/18/2016 0.3  0.2 - 0.9 K/uL Final  . Eosinophils Relative 09/18/2016 2  % Final  . Eosinophils Absolute 09/18/2016 0.1  0 - 0.7 K/uL Final  . Basophils Relative 09/18/2016 1  %  Final  . Basophils Absolute 09/18/2016 0.0  0 - 0.1 K/uL Final  . Sodium 09/18/2016 135  135 - 145 mmol/L Final  . Potassium 09/18/2016 4.1  3.5 - 5.1 mmol/L Final  . Chloride 09/18/2016 98* 101 - 111 mmol/L Final  . CO2 09/18/2016 28  22 - 32 mmol/L Final  . Glucose, Bld 09/18/2016 119* 65 - 99 mg/dL Final  . BUN 09/18/2016 18  6 - 20 mg/dL Final  . Creatinine, Ser 09/18/2016 1.18* 0.44 - 1.00 mg/dL Final  . Calcium 09/18/2016 9.2  8.9 - 10.3 mg/dL Final  . Total Protein 09/18/2016 6.9  6.5 - 8.1 g/dL Final  . Albumin 09/18/2016 3.9  3.5 - 5.0 g/dL Final  . AST 09/18/2016 25  15 - 41 U/L Final  . ALT 09/18/2016 23  14 - 54 U/L Final  . Alkaline Phosphatase 09/18/2016 119  38 - 126 U/L Final  . Total Bilirubin 09/18/2016 0.5  0.3 - 1.2 mg/dL Final  . GFR calc non Af Amer 09/18/2016 51* >60 mL/min Final  . GFR calc Af Amer 09/18/2016 59* >60 mL/min Final   Comment: (NOTE) The eGFR has been calculated using the CKD EPI equation. This calculation has not been validated in all clinical situations. eGFR's persistently <60 mL/min signify possible Chronic Kidney Disease.   . Anion gap 09/18/2016 9  5 - 15 Final    Assessment:  SAREA FYFE is a 55 y.o. female with stage IA triple negative left breast cancer s/p lumpectomy and sentinel lymph node biopsy on 12/06/2012.  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  Tumor was ER, PR and HER-2/neu negative. Sentinel left nodes were negative.  Pathologic stage was T1cN0M0.    BRCA mutation was negative in 11/2012.  She has a family history of breast cancer (mother at age 53 and maternal aunt).  She received Adriamycin and  Cytoxan (AC) 4 from 12/17/2011 - 02/17/2013. She received weekly Taxol 12 from 03/10/2013 - 06/02/2013.  She received radiation.    CA27.29 has been monitored:  33.9 on 06/02/2013, 26.1 on 02/14/2014, 23.8 on 08/22/2014, and 25.1 on 09/18/2016.  Bilateral diagnostic mammogram on 12/14/2015 revealed expected density and architectural distortion with dystrophic calcifications in the upper outer quadrant of the left breast stable compared to prior studies.  There was left breast skin thickening, stable with post radiation change. There are no suspicious masses, malignant calcifications, sites of nonsurgical architectural distortions or assymmetries in either breast.  She has a history of iron deficiency anemia.  She is on ferrous sulfate 325 mg a day.  Diet is good.  She denies any melena or hematochezia.  Hematocrit has improved from 30.4 on 03/18/2016 to 35.6 on 09/18/2016.   Colonoscopy on 04/28/2016 revealed a 3 mm and 7 mm polyp in the transverse colon.  There was a 2 mm polyp in the rectum.  Repeat colonoscopy was recommended in 5 years for surveillance.  EGD on 10/23/2015 revealed benign-appearing esophageal stenosis which was dilated.  She had LA Grade B reflux esophagitis.  Stomach and duodenum was normal.  She was seen by Dr. Lucilla Lame on 08/26/2016 for reflux.  She was started on Dexilant 60 mg in the evening.  She has fibromyalgia and is on gabapentin 400 mg twice a day.  Symptomatically, she denies any breast concerns.  Exam is unremarkable.  Ferritin is 39.  Plan: 1.  Discuss entire medical history, diagnosis and managment of breast cancer.  Discuss recent mammogram. 2.  Discuss iron deficiency anemia and management of reflux.  Continue oral iron until ferritin is 100. 3.  Labs today:  CBC with diff, CMP, CA27.29, ferritin, iron studies. 4.  Anticipate follow-up CBC and ferritin in 3 months. 5.  RTC in 6 months for MD assessment and labs (CBC with diff, CMP, CA27.29, ferritin, TSH,  T4).   Lequita Asal, MD  09/18/2016, 11:03 AM

## 2016-09-19 LAB — CANCER ANTIGEN 27.29: CA 27.29: 25.1 U/mL (ref 0.0–38.6)

## 2016-09-22 ENCOUNTER — Telehealth: Payer: Self-pay | Admitting: *Deleted

## 2016-09-22 NOTE — Telephone Encounter (Signed)
Called pt to let her know that ferritin came up. Dr. Mike Gip wanted her to have labs and see md in 3 months and she wanted to know if pt wanted to see PCP or come see [facility].  Patient states she sees her PCP every 3 months and she would prefer to see PCP. I told her that I would send a message to her PCP. Dr. Ancil Boozer and I did send inbasket.  The message read:  Dr. Mike Gip states tha her iron levels are up and she should cont. Oral iron pills.  Dr. Mike Gip states she needs to have labs in 3 months to make sure levels are going up.  She wanted to know if pt wants to come here or see her PCP.  I called the pt and she would like pt to see you Dr. Ancil Boozer because she states she sees you every 3 months.  Please let pt know and make appt if you agree.  I have told pt that I was sending her labs with request to see you.  Thank you  Moishe Spice RN for [employer]

## 2016-09-23 ENCOUNTER — Encounter: Payer: Self-pay | Admitting: Family Medicine

## 2016-09-23 DIAGNOSIS — N183 Chronic kidney disease, stage 3 unspecified: Secondary | ICD-10-CM | POA: Insufficient documentation

## 2016-09-26 ENCOUNTER — Other Ambulatory Visit: Payer: Self-pay | Admitting: Family Medicine

## 2016-09-26 DIAGNOSIS — M797 Fibromyalgia: Secondary | ICD-10-CM

## 2016-09-26 NOTE — Telephone Encounter (Signed)
Patient requesting refill of Gabapentin to [facility]. 

## 2016-09-29 ENCOUNTER — Other Ambulatory Visit: Payer: Self-pay

## 2016-09-29 DIAGNOSIS — I10 Essential (primary) hypertension: Secondary | ICD-10-CM

## 2016-09-29 DIAGNOSIS — R600 Localized edema: Secondary | ICD-10-CM

## 2016-09-29 MED ORDER — VALSARTAN-HYDROCHLOROTHIAZIDE 320-25 MG PO TABS: 1.0000 | ORAL_TABLET | Freq: Every day | ORAL | 0 refills | Status: DC

## 2016-09-29 NOTE — Telephone Encounter (Signed)
Patient requesting refill of Valsartan-HCTZ to [facility] with a 90 day supply.

## 2016-10-02 ENCOUNTER — Ambulatory Visit (INDEPENDENT_AMBULATORY_CARE_PROVIDER_SITE_OTHER): Payer: BLUE CROSS/BLUE SHIELD | Admitting: Family Medicine

## 2016-10-02 ENCOUNTER — Encounter: Payer: Self-pay | Admitting: Family Medicine

## 2016-10-02 VITALS — BP 128/74 | HR 86 | Temp 98.7°F | Resp 16 | Wt 191.2 lb

## 2016-10-02 DIAGNOSIS — R600 Localized edema: Secondary | ICD-10-CM

## 2016-10-02 DIAGNOSIS — I10 Essential (primary) hypertension: Secondary | ICD-10-CM | POA: Diagnosis not present

## 2016-10-02 DIAGNOSIS — G43009 Migraine without aura, not intractable, without status migrainosus: Secondary | ICD-10-CM

## 2016-10-02 DIAGNOSIS — K219 Gastro-esophageal reflux disease without esophagitis: Secondary | ICD-10-CM | POA: Diagnosis not present

## 2016-10-02 DIAGNOSIS — R5382 Chronic fatigue, unspecified: Secondary | ICD-10-CM

## 2016-10-02 DIAGNOSIS — R635 Abnormal weight gain: Secondary | ICD-10-CM | POA: Diagnosis not present

## 2016-10-02 DIAGNOSIS — F339 Major depressive disorder, recurrent, unspecified: Secondary | ICD-10-CM | POA: Diagnosis not present

## 2016-10-02 DIAGNOSIS — M797 Fibromyalgia: Secondary | ICD-10-CM | POA: Diagnosis not present

## 2016-10-02 DIAGNOSIS — D8989 Other specified disorders involving the immune mechanism, not elsewhere classified: Secondary | ICD-10-CM | POA: Diagnosis not present

## 2016-10-02 DIAGNOSIS — G9332 Myalgic encephalomyelitis/chronic fatigue syndrome: Secondary | ICD-10-CM

## 2016-10-02 LAB — TSH: TSH: 3.36 m[IU]/L

## 2016-10-02 MED ORDER — VALSARTAN-HYDROCHLOROTHIAZIDE 320-25 MG PO TABS: 1.0000 | ORAL_TABLET | Freq: Every day | ORAL | 1 refills | Status: DC

## 2016-10-02 MED ORDER — ONDANSETRON HCL 4 MG PO TABS
ORAL_TABLET | ORAL | 0 refills | Status: DC
Start: 2016-10-02 — End: 2017-07-03

## 2016-10-02 NOTE — Progress Notes (Signed)
Name: Deanna Baker   MRN: [id number]    DOB: [birth year]   Date:10/02/2016       Progress Note  Subjective  Chief Complaint  Chief Complaint  Patient presents with  . Medication Refill    3 month F/U  . Hypertension  . FMS  . Sleep Apnea  . Gastroesophageal Reflux  . Depression  . Obesity    concerned about weight over the last year almost 25 lbs    HPI  HTN: She is taking Valsartan HCTZ , bp has been well controlled.  She denies side effects of medication No chest pain or palpitation   FMS: she continues to have daily pain, taking pain medications :gabapentin, and antidepressant, prn Tramadoland when the pain is so intense Hydrodocone. She has daily mental fogginess, trigger point pains.  Pain is 4/10 at this time  CFIDS: still feels tired all the time, but symptoms waxes and wanes in severity.  Post-activity fatigue is severe. Off stimulants, but does not want to go back on medication   Reflux esophagitis: choking and also esophageal thickenes on CT chest/abdomen. Seen by Dr. Durwin Reges she had an EGD and esophageal dilation, and was diagnosed with reflux esophagitis and advised to increase PPI to twice daily. She tried coming off Omeprazole but symptoms got worse , saw Dr. Durwin Reges and she is on [place] now  Major Depression: she is taking Paxil and Wellbutrin, she states symptoms are in remission, she has anhedonia only when FMS or chronic fatigue are flaring.   ASCUS: discussed negative HPV and recheck in one year  OSA: she is wearing CPAP every night, all night, she has noticed a little more energy in am's. .  Migraine headaches: no recent episodes   Patient Active Problem List   Diagnosis Date Noted  . Chronic kidney disease, stage 3 09/23/2016  . Sleep apnea 07/11/2016  . ASCUS favor benign 07/10/2016  . Memory loss 05/12/2016  . Smell or taste sensation disturbance 05/12/2016  . Personal history of colonic polyps   . Benign neoplasm of transverse colon   .  Rectal polyp   . Tenosynovitis of thumb 03/12/2016  . Bilateral knee pain 03/12/2016  . Reflux esophagitis   . Stricture and stenosis of esophagus   . Abnormal findings-gastrointestinal tract   . Thickening of esophagus 08/03/2015  . Choking 07/04/2015  . Benign essential HTN 05/01/2015  . CFIDS (chronic fatigue and immune dysfunction syndrome) ([place]) 05/01/2015  . Major depression, recurrent, chronic ([place]) 05/01/2015  . Dyslipidemia 05/01/2015  . Fibromyalgia syndrome 05/01/2015  . Blood glucose elevated 05/01/2015  . Eczema intertrigo 05/01/2015  . Iron deficiency anemia due to chronic blood loss 05/01/2015  . Migraine without aura and without status migrainosus, not intractable 05/01/2015  . Excessive urination at night 05/01/2015  . Dysmetabolic syndrome 77/41/2878  . History of colonoscopy with polypectomy 05/01/2015  . Central sleep apnea 05/01/2015  . History of back surgery 05/01/2015  . Thickened nails 05/01/2015  . Malignant neoplasm of upper-outer quadrant of female breast, triple negative 06/13/2013    Past Surgical History:  Procedure Laterality Date  . BACK SURGERY    . BREAST LUMPECTOMY Left 12/06/12    lumpectomy with SN biopsy and power port placement...  . COLONOSCOPY  2013   Dr. Suzette Battiest   . COLONOSCOPY WITH PROPOFOL N/A 04/28/2016   Procedure: COLONOSCOPY WITH PROPOFOL;  Surgeon: Lucilla Lame, MD;  Location: [facility];  Service: Endoscopy;  Laterality: N/A;  CPAP  . ESOPHAGOGASTRODUODENOSCOPY (EGD)  WITH PROPOFOL N/A 10/23/2015   Procedure: ESOPHAGOGASTRODUODENOSCOPY (EGD) WITH PROPOFOL;  Surgeon: Darren Wohl, MD;  Location: [facility];  Service: Endoscopy;  Laterality: N/A;  . POLYPECTOMY  04/28/2016   Procedure: POLYPECTOMY;  Surgeon: Darren Wohl, MD;  Location: [facility];  Service: Endoscopy;;  . SHOULDER SURGERY Left 2009  . TONSILLECTOMY  1965  . TUBAL LIGATION  1982  . UPPER GI ENDOSCOPY     Dr. Ifitikhar    Family History   Problem Relation Age of Onset  . Breast cancer Mother     Social History   Social History  . Marital status: Married    Spouse name: N/A  . Number of children: N/A  . Years of education: N/A   Occupational History  . Not on file.   Social History Main Topics  . Smoking status: Former Smoker    Packs/day: 1.00    Years: 20.00    Types: Cigarettes    Quit date: 11/24/1980  . Smokeless tobacco: Never Used  . Alcohol use 0.0 oz/week     Comment: rarely, 1-2x/mo  . Drug use: No  . Sexual activity: Yes    Partners: Male   Other Topics Concern  . Not on file   Social History Narrative  . No narrative on file     Current Outpatient Prescriptions:  .  ALPRAZolam (XANAX) 0.5 MG tablet, Take 1 tablet (0.5 mg total) by mouth 2 (two) times daily as needed for anxiety., Disp: 45 tablet, Rfl: 0 .  buPROPion (WELLBUTRIN XL) 300 MG 24 hr tablet, Take 1 tablet (300 mg total) by mouth daily., Disp: 30 tablet, Rfl: 5 .  dexlansoprazole (DEXILANT) 60 MG capsule, Take 1 capsule (60 mg total) by mouth daily., Disp: 30 capsule, Rfl: 6 .  docusate sodium (COLACE) 100 MG capsule, Take 100 mg by mouth 2 (two) times daily., Disp: , Rfl:  .  Elastic Bandages & Supports (THUMB BRACE) MISC, 1 Units by Does not apply route daily. (Patient not taking: Reported on 09/18/2016), Disp: 1 each, Rfl: 0 .  EPIPEN 2-PAK 0.3 MG/0.3ML SOAJ injection, Reported on 04/28/2016, Disp: , Rfl: 1 .  ferrous gluconate (FERGON) 324 MG tablet, Take 1 tablet (324 mg total) by mouth daily with breakfast., Disp: 30 tablet, Rfl: 5 .  gabapentin (NEURONTIN) 400 MG capsule, TAKE 1 CAPSULE (400 MG TOTAL) BY MOUTH 3 (THREE) TIMES DAILY., Disp: 90 capsule, Rfl: 4 .  HYDROcodone-acetaminophen (NORCO) 10-325 MG tablet, Take 1 tablet by mouth 3 (three) times daily as needed. If patient does not respond to Tramadol To last 3 months, Disp: 45 tablet, Rfl: 0 .  Multiple Vitamins-Minerals (CENTRUM SILVER ADULT 50+ PO), Take by mouth., Disp:  , Rfl:  .  Nerve Stimulator (STANDARD TENS) DEVI, 1 Units by Does not apply route daily., Disp: 1 Device, Rfl: 0 .  ondansetron (ZOFRAN) 4 MG tablet, TAKE 1 TABLET EVERY 8 HOURS AS NEEDED FOR NAUSEA AND VOMITING, Disp: 20 tablet, Rfl: 0 .  PARoxetine (PAXIL) 40 MG tablet, Take 1 tablet (40 mg total) by mouth every morning., Disp: 30 tablet, Rfl: 5 .  traMADol (ULTRAM) 50 MG tablet, Take 1 tablet (50 mg total) by mouth every 6 (six) hours as needed., Disp: 60 tablet, Rfl: 2 .  valsartan-hydrochlorothiazide (DIOVAN-HCT) 320-25 MG tablet, Take 1 tablet by mouth daily., Disp: 90 tablet, Rfl: 1  Allergies  Allergen Reactions  . Ace Inhibitors Swelling  . Pregabalin     weight gain and blurred vision  .   Venlafaxine     foggy minded     ROS  Constitutional: Negative for fever, positive for  weight change.  Respiratory: Negative for cough and shortness of breath.   Cardiovascular: Negative for chest pain or palpitations.  Gastrointestinal: Negative for abdominal pain, no bowel changes.  Musculoskeletal: Negative for gait problem or joint swelling.  Skin: Negative for rash.  Neurological: Negative for dizziness or headache.  No other specific complaints in a complete review of systems (except as listed in HPI above).  Objective  Vitals:   10/02/16 1142  BP: 128/74  Pulse: 86  Resp: 16  Temp: 98.7 F (37.1 C)  TempSrc: Oral  SpO2: 97%  Weight: 191 lb 3 oz (86.7 kg)    Body mass index is 36.12 kg/m.  Physical Exam  Constitutional: Patient appears well-developed and well-nourished. Obese  No distress.  HEENT: head atraumatic, normocephalic, pupils equal and reactive to light,  neck supple, throat within normal limits Cardiovascular: Normal rate, regular rhythm and normal heart sounds.  No murmur heard. No BLE edema. Pulmonary/Chest: Effort normal and breath sounds normal. No respiratory distress. Abdominal: Soft.  There is no tenderness. Psychiatric: Patient has a normal mood  and affect. behavior is normal. Judgment and thought content normal. Muscular Skeletal: trigger points positive  Recent Results (from the past 2160 hour(s))  PapLb, HPV, rfx16/18     Status: Abnormal   Collection Time: 07/08/16 12:31 PM  Result Value Ref Range   DIAGNOSIS: Comment (A)     Comment: EPITHELIAL CELL ABNORMALITY. ATYPICAL SQUAMOUS CELLS OF UNDETERMINED SIGNIFICANCE.    Specimen adequacy: Comment     Comment: Satisfactory for evaluation. Endocervical and/or squamous metaplastic cells (endocervical component) are present.    CLINICIAN PROVIDED ICD10: Comment     Comment: Z12.4   Performed by: Comment     Comment: Dimitria Harding, Cytotechnologist (ASCP)   Electronically signed by: Comment     Comment: Ketan A Patel, MD, Pathologist   PAP SMEAR COMMENT .    PATHOLOGIST PROVIDED ICD10: Comment     Comment: R87.610   Note: Comment     Comment: The Pap smear is a screening test designed to aid in the detection of premalignant and malignant conditions of the uterine cervix.  It is not a diagnostic procedure and should not be used as the sole means of detecting cervical cancer.  Both false-positive and false-negative reports do occur.    HPV, high-risk Negative Negative    Comment: This high-risk HPV test detects thirteen high-risk types (16/18/31/33/35/39/45/51/52/56/58/59/68) without differentiation.   Lipid panel     Status: Abnormal   Collection Time: 07/09/16 10:06 AM  Result Value Ref Range   Cholesterol, Total 212 (H) 100 - 199 mg/dL   Triglycerides 109 0 - 149 mg/dL   HDL 59 >39 mg/dL   VLDL Cholesterol Cal 22 5 - 40 mg/dL   LDL Calculated 131 (H) 0 - 99 mg/dL   Chol/HDL Ratio 3.6 0.0 - 4.4 ratio units    Comment:                                   T. Chol/HDL Ratio                                             Men  Women                                 1/2 Avg.Risk  3.4    3.3                                   Avg.Risk  5.0    4.4                                 2X Avg.Risk  9.6    7.1                                3X Avg.Risk 23.4   11.0   Hemoglobin A1c     Status: None   Collection Time: 07/09/16 10:06 AM  Result Value Ref Range   Hgb A1c MFr Bld 5.5 4.8 - 5.6 %    Comment:          Pre-diabetes: 5.7 - 6.4          Diabetes: >6.4          Glycemic control for adults with diabetes: <7.0    Est. average glucose Bld gHb Est-mCnc 111 mg/dL  Comprehensive metabolic panel     Status: Abnormal   Collection Time: 07/09/16 10:06 AM  Result Value Ref Range   Glucose 102 (H) 65 - 99 mg/dL   BUN 15 6 - 24 mg/dL   Creatinine, Ser 1.15 (H) 0.57 - 1.00 mg/dL   GFR calc non Af Amer 54 (L) >59 mL/min/1.73   GFR calc Af Amer 62 >59 mL/min/1.73   BUN/Creatinine Ratio 13 9 - 23   Sodium 144 134 - 144 mmol/L   Potassium 4.9 3.5 - 5.2 mmol/L   Chloride 102 96 - 106 mmol/L   CO2 26 18 - 29 mmol/L   Calcium 10.0 8.7 - 10.2 mg/dL   Total Protein 6.3 6.0 - 8.5 g/dL   Albumin 4.2 3.5 - 5.5 g/dL   Globulin, Total 2.1 1.5 - 4.5 g/dL   Albumin/Globulin Ratio 2.0 1.2 - 2.2   Bilirubin Total 0.3 0.0 - 1.2 mg/dL   Alkaline Phosphatase 112 39 - 117 IU/L   AST 22 0 - 40 IU/L   ALT 19 0 - 32 IU/L  CBC with Differential     Status: Abnormal   Collection Time: 09/18/16  9:20 AM  Result Value Ref Range   WBC 5.6 3.6 - 11.0 K/uL   RBC 3.96 3.80 - 5.20 MIL/uL   Hemoglobin 12.3 12.0 - 16.0 g/dL   HCT 35.6 35.0 - 47.0 %   MCV 89.9 80.0 - 100.0 fL   MCH 30.9 26.0 - 34.0 pg   MCHC 34.4 32.0 - 36.0 g/dL   RDW 14.3 11.5 - 14.5 %   Platelets 193 150 - 440 K/uL   Neutrophils Relative % 74 %   Neutro Abs 4.2 1.4 - 6.5 K/uL   Lymphocytes Relative 17 %   Lymphs Abs 0.9 (L) 1.0 - 3.6 K/uL   Monocytes Relative 6 %   Monocytes Absolute 0.3 0.2 - 0.9 K/uL   Eosinophils Relative 2 %   Eosinophils Absolute 0.1 0 - 0.7 K/uL   Basophils Relative 1 %   Basophils Absolute 0.0 0 - 0.1 K/uL  Comprehensive metabolic panel     Status: Abnormal   Collection Time: 09/18/16  9:20  AM  Result Value Ref Range   Sodium 135 135 -   145 mmol/L   Potassium 4.1 3.5 - 5.1 mmol/L   Chloride 98 (L) 101 - 111 mmol/L   CO2 28 22 - 32 mmol/L   Glucose, Bld 119 (H) 65 - 99 mg/dL   BUN 18 6 - 20 mg/dL   Creatinine, Ser 1.18 (H) 0.44 - 1.00 mg/dL   Calcium 9.2 8.9 - 10.3 mg/dL   Total Protein 6.9 6.5 - 8.1 g/dL   Albumin 3.9 3.5 - 5.0 g/dL   AST 25 15 - 41 U/L   ALT 23 14 - 54 U/L   Alkaline Phosphatase 119 38 - 126 U/L   Total Bilirubin 0.5 0.3 - 1.2 mg/dL   GFR calc non Af Amer 51 (L) >60 mL/min   GFR calc Af Amer 59 (L) >60 mL/min    Comment: (NOTE) The eGFR has been calculated using the CKD EPI equation. This calculation has not been validated in all clinical situations. eGFR's persistently <60 mL/min signify possible Chronic Kidney Disease.    Anion gap 9 5 - 15  Ferritin     Status: None   Collection Time: 09/18/16  9:20 AM  Result Value Ref Range   Ferritin 39 11 - 307 ng/mL  Iron and TIBC     Status: None   Collection Time: 09/18/16  9:20 AM  Result Value Ref Range   Iron 61 28 - 170 ug/dL   TIBC 318 250 - 450 ug/dL   Saturation Ratios 19 10.4 - 31.8 %   UIBC 257 ug/dL  Cancer antigen 27.29     Status: None   Collection Time: 09/18/16  9:20 AM  Result Value Ref Range   CA 27.29 25.1 0.0 - 38.6 U/mL    Comment: (NOTE) [facility]/ACS methodology Performed At: [facility] [place], [place] [id number] Lindon Romp MD FG:[id number]      PHQ2/9: Depression screen [facility] 2/9 10/02/2016 07/11/2016 05/12/2016 04/07/2016 03/12/2016  Decreased Interest 0 0 0 0 0  Down, Depressed, Hopeless 0 0 0 0 0  PHQ - 2 Score 0 0 0 0 0  Altered sleeping - - - - -  Tired, decreased energy - - - - -  Change in appetite - - - - -  Feeling bad or failure about yourself  - - - - -  Trouble concentrating - - - - -  Moving slowly or fidgety/restless - - - - -  Suicidal thoughts - - - - -  PHQ-9 Score - - - - -  Difficult doing work/chores - - - - -     Fall Risk: Fall Risk  10/02/2016 07/11/2016 05/12/2016 04/07/2016 03/12/2016  Falls in the past year? _0   Number falls in past yr: - - - - -  Injury with Fall? - - - - No     Functional Status Survey: Is the patient deaf or have difficulty hearing?: No Does the patient have difficulty seeing, even when wearing glasses/contacts?: No Does the patient have difficulty concentrating, remembering, or making decisions?: No Does the patient have difficulty walking or climbing stairs?: No Does the patient have difficulty dressing or bathing?: No Does the patient have difficulty doing errands alone such as visiting a doctor's office or shopping?: No    Assessment & Plan  1. Benign essential HTN  - valsartan-hydrochlorothiazide (DIOVAN-HCT) 320-25 MG tablet; Take 1 tablet by mouth daily.  Dispense: 90 tablet; Refill: 1  2. Gastro-esophageal reflux disease without esophagitis  Seen by Dr.  Whol and is on Dexilant because she went off Omeprazole because of possible side effects but could not tolerate being off medication  3. CFIDS (chronic fatigue and immune dysfunction syndrome) ([facility])  stable  4. Fibromyalgia syndrome  Stable, takes either hydrocodone or ultram prn   5. Major depression, recurrent, chronic ([facility])  stable  6. Migraine without aura and without status migrainosus, not intractable  - ondansetron (ZOFRAN) 4 MG tablet; TAKE 1 TABLET EVERY 8 HOURS AS NEEDED FOR NAUSEA AND VOMITING  Dispense: 20 tablet; Refill: 0  7. Bilateral edema of lower extremity  - valsartan-hydrochlorothiazide (DIOVAN-HCT) 320-25 MG tablet; Take 1 tablet by mouth daily.  Dispense: 90 tablet; Refill: 1  8. Weight gain  - TSH - Insulin, Free (Bioactive) 

## 2016-10-06 ENCOUNTER — Telehealth: Payer: Self-pay | Admitting: Gastroenterology

## 2016-10-06 LAB — INSULIN, FREE (BIOACTIVE): Insulin, Free: 7.3 u[IU]/mL (ref 1.5–14.9)

## 2016-10-06 NOTE — Telephone Encounter (Signed)
Patient called that pharmacy won't refill Dexalant rx until the office completes prior authorization. [facility] [address], not in [employer], states they have been trying to get the office to complete this for a couple of days per patient. Please advise.

## 2016-10-06 NOTE — Telephone Encounter (Signed)
Left vm letting pt know I was not aware a prior authorization will be needed. Will work on [employer], engineering approved.

## 2016-10-07 ENCOUNTER — Encounter: Payer: Self-pay | Admitting: Radiation Oncology

## 2016-10-07 ENCOUNTER — Ambulatory Visit: Payer: BLUE CROSS/BLUE SHIELD | Admitting: Family Medicine

## 2016-10-07 ENCOUNTER — Ambulatory Visit
Admission: RE | Admit: 2016-10-07 | Discharge: 2016-10-07 | Disposition: A | Discharge status: Home / Self Care | Payer: BLUE CROSS/BLUE SHIELD | Source: Ambulatory Visit | Attending: Radiation Oncology | Admitting: Radiation Oncology

## 2016-10-07 VITALS — BP 124/68 | HR 74 | Temp 98.1°F | Wt 187.4 lb

## 2016-10-07 DIAGNOSIS — Z923 Personal history of irradiation: Secondary | ICD-10-CM | POA: Diagnosis not present

## 2016-10-07 DIAGNOSIS — C50919 Malignant neoplasm of unspecified site of unspecified female breast: Secondary | ICD-10-CM

## 2016-10-07 DIAGNOSIS — Z853 Personal history of malignant neoplasm of breast: Secondary | ICD-10-CM | POA: Diagnosis not present

## 2016-10-07 NOTE — Telephone Encounter (Signed)
LVM letting pt know Dexilant was approved.

## 2016-10-07 NOTE — Progress Notes (Signed)
Radiation Oncology Follow up Note  Name: Deanna Baker   Date:   10/07/2016 MRN:  [id number] DOB: [birth year]    This 55 y.o. female presents to the clinic today for 3 year follow-up status post whole breast radiation for triple negative left breast cancer.Marland Kitchen  REFERRING PROVIDER: Steele Sizer, MD  HPI: Patient is a 55 year old female now out 3 years having completed whole breast radiation to her left breast for stage IC (T1 CN 0 M0) invasive mammary carcinoma triple negative. She underwent chemotherapy followed by whole breast radiation. She seen today in routine follow-up and is doing well. She specifically denies breast tenderness cough or bone pain. Follow-up mammograms have been fine.. She had a PET scan back in September 2016 showing no evidence of disease.  COMPLICATIONS OF TREATMENT: none  FOLLOW UP COMPLIANCE: keeps appointments   PHYSICAL EXAM:  BP 124/68   Pulse 74   Temp 98.1 F (36.7 C)   Wt 187 lb 6.3 oz (85 kg)   BMI 35.41 kg/m  Lungs are clear to A&P cardiac examination essentially unremarkable with regular rate and rhythm. No dominant mass or nodularity is noted in either breast in 2 positions examined. Incision is well-healed. No axillary or supraclavicular adenopathy is appreciated. Cosmetic result is excellent. Well-developed well-nourished patient in NAD. HEENT reveals PERLA, EOMI, discs not visualized.  Oral cavity is clear. No oral mucosal lesions are identified. Neck is clear without evidence of cervical or supraclavicular adenopathy. Lungs are clear to A&P. Cardiac examination is essentially unremarkable with regular rate and rhythm without murmur rub or thrill. Abdomen is benign with no organomegaly or masses noted. Motor sensory and DTR levels are equal and symmetric in the upper and lower extremities. Cranial nerves II through XII are grossly intact. Proprioception is intact. No peripheral adenopathy or edema is identified. No motor or sensory levels are noted.  Crude visual fields are within normal range.  RADIOLOGY RESULTS: Last mammograms are reviewed. Most current ones to be performed in January I've asked to review when available.  PLAN: Present time she is doing well with no evidence of disease. I am please were overall progress. I've asked to see her back in 1 year for follow-up. Patient is to call sooner with any concerns.  I would like to take this opportunity to thank you for allowing me to participate in the care of your patient.Armstead Peaks., MD

## 2016-10-09 ENCOUNTER — Other Ambulatory Visit: Payer: Self-pay

## 2016-10-09 DIAGNOSIS — C50412 Malignant neoplasm of upper-outer quadrant of left female breast: Secondary | ICD-10-CM

## 2016-10-10 ENCOUNTER — Ambulatory Visit: Payer: BLUE CROSS/BLUE SHIELD | Admitting: Radiation Oncology

## 2016-10-13 ENCOUNTER — Ambulatory Visit: Payer: BLUE CROSS/BLUE SHIELD | Admitting: Family Medicine

## 2016-10-31 ENCOUNTER — Other Ambulatory Visit: Payer: Self-pay | Admitting: Family Medicine

## 2016-10-31 DIAGNOSIS — F339 Major depressive disorder, recurrent, unspecified: Secondary | ICD-10-CM

## 2016-11-24 ENCOUNTER — Other Ambulatory Visit: Payer: Self-pay | Admitting: Family Medicine

## 2016-11-24 DIAGNOSIS — M797 Fibromyalgia: Secondary | ICD-10-CM

## 2016-11-26 ENCOUNTER — Other Ambulatory Visit: Payer: Self-pay

## 2016-12-05 ENCOUNTER — Other Ambulatory Visit: Payer: Self-pay | Admitting: Family Medicine

## 2016-12-05 DIAGNOSIS — I10 Essential (primary) hypertension: Secondary | ICD-10-CM

## 2016-12-05 DIAGNOSIS — M797 Fibromyalgia: Secondary | ICD-10-CM

## 2016-12-17 ENCOUNTER — Ambulatory Visit
Admission: RE | Admit: 2016-12-17 | Discharge: 2016-12-17 | Disposition: A | Discharge status: Home / Self Care | Payer: BLUE CROSS/BLUE SHIELD | Source: Ambulatory Visit | Attending: General Surgery | Admitting: General Surgery

## 2016-12-17 DIAGNOSIS — Z853 Personal history of malignant neoplasm of breast: Secondary | ICD-10-CM | POA: Diagnosis not present

## 2016-12-17 DIAGNOSIS — C50412 Malignant neoplasm of upper-outer quadrant of left female breast: Secondary | ICD-10-CM

## 2016-12-18 ENCOUNTER — Encounter: Payer: Self-pay | Admitting: *Deleted

## 2016-12-24 ENCOUNTER — Ambulatory Visit (INDEPENDENT_AMBULATORY_CARE_PROVIDER_SITE_OTHER): Payer: BLUE CROSS/BLUE SHIELD | Admitting: General Surgery

## 2016-12-24 VITALS — BP 130/76 | HR 78 | Resp 12 | Ht 61.0 in | Wt 190.0 lb

## 2016-12-24 DIAGNOSIS — C50412 Malignant neoplasm of upper-outer quadrant of left female breast: Secondary | ICD-10-CM | POA: Diagnosis not present

## 2016-12-24 NOTE — Patient Instructions (Signed)
The patient has been asked to return to the office in one year with a bilateral diagnostic mammogram with Dr. Terri Piedra.    Call back with any questions/concerns.

## 2016-12-24 NOTE — Progress Notes (Signed)
Patient ID: Deanna Baker, female   DOB: Jul 04, 1961, 56 y.o.   MRN: [id number]  Chief Complaint  Patient presents with  . Follow-up    HPI Deanna Baker is a 56 y.o. female who presents for a breast cancer follow up.. The most recent mammogram was done on 12/17/2016  Patient does perform regular self breast checks and gets regular mammograms done.  Denies any breast symptoms I have reviewed the history of present illness with the patient.   HPI  Past Medical History:  Diagnosis Date  . Anemia   . Arthritis 1990  . Bronchitis 5/17  . Chronic fatigue syndrome   . Colon polyp 2013  . Fibromyalgia 1993  . GERD (gastroesophageal reflux disease)   . Heart murmur   . Hypertension 2012  . Lump or mass in breast   . Malignant neoplasm of upper-outer quadrant of female breast ([place])    Invasive mammary carcinoma, triple negative  . Measles   . Mumps 1973  . MVP (mitral valve prolapse)   . Obstructive sleep apnea on CPAP 11/27/15  . Shortness of breath dyspnea    chronic fatigue/fibromyalgia    Past Surgical History:  Procedure Laterality Date  . BACK SURGERY    . BREAST BIOPSY Left 10/2012   +  . 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 BREAST LUMPECTOMY Left 12/06/12    lumpectomy with SN biopsy and power port placement...  . COLONOSCOPY  2013   Dr. Suzette Battiest   . COLONOSCOPY WITH PROPOFOL N/A 04/28/2016   Procedure: COLONOSCOPY WITH PROPOFOL;  Surgeon: Lucilla Lame, MD;  Location: [facility];  Service: Endoscopy;  Laterality: N/A;  CPAP  . ESOPHAGOGASTRODUODENOSCOPY (EGD) WITH PROPOFOL N/A 10/23/2015   Procedure: ESOPHAGOGASTRODUODENOSCOPY (EGD) WITH PROPOFOL;  Surgeon: Lucilla Lame, MD;  Location: [facility];  Service: Endoscopy;  Laterality: N/A;  . POLYPECTOMY  04/28/2016   Procedure: POLYPECTOMY;  Surgeon: Lucilla Lame, MD;  Location: [facility];  Service: Endoscopy;;  . SHOULDER SURGERY Left 2009  . TONSILLECTOMY  1965  . TUBAL  LIGATION  1982  . UPPER GI ENDOSCOPY     Dr. Suzette Battiest    Family History  Problem Relation Age of Onset  . Breast cancer Mother 78  . Breast cancer Maternal Aunt   . Brain cancer Paternal Uncle     x 2 mat uncles    Social History Social History  Substance Use Topics  . Smoking status: Former Smoker    Packs/day: 1.00    Years: 20.00    Types: Cigarettes    Quit date: 11/24/1980  . Smokeless tobacco: Never Used  . Alcohol use 0.0 oz/week     Comment: rarely, 1-2x/mo    Allergies  Allergen Reactions  . Ace Inhibitors Swelling  . Pregabalin     weight gain and blurred vision  . Venlafaxine     foggy minded    Current Outpatient Prescriptions  Medication Sig Dispense Refill  . ALPRAZolam (XANAX) 0.5 MG tablet TAKE 1 TABLET BY MOUTH TWICE A DAY AS NEEDED FOR ANXIETY 45 tablet 0  . buPROPion (WELLBUTRIN XL) 300 MG 24 hr tablet Take 1 tablet (300 mg total) by mouth daily. 30 tablet 5  . dexlansoprazole (DEXILANT) 60 MG capsule Take 1 capsule (60 mg total) by mouth daily. 30 capsule 6  . docusate sodium (COLACE) 100 MG capsule Take 100 mg by mouth 2 (two) times daily.    Regino Schultze Bandages &  Supports (THUMB BRACE) MISC 1 Units by Does not apply route daily. 1 each 0  . EPIPEN 2-PAK 0.3 MG/0.3ML SOAJ injection Reported on 04/28/2016  1  . ferrous gluconate (FERGON) 324 MG tablet Take 1 tablet (324 mg total) by mouth daily with breakfast. 30 tablet 5  . gabapentin (NEURONTIN) 400 MG capsule TAKE 1 CAPSULE (400 MG TOTAL) BY MOUTH 3 (THREE) TIMES DAILY. 90 capsule 4  . HYDROcodone-acetaminophen (NORCO) 10-325 MG tablet Take 1 tablet by mouth 3 (three) times daily as needed. If patient does not respond to Tramadol To last 3 months 45 tablet 0  . Multiple Vitamins-Minerals (CENTRUM SILVER ADULT 50+ PO) Take by mouth.    . Nerve Stimulator (STANDARD TENS) DEVI 1 Units by Does not apply route daily. 1 Device 0  . ondansetron (ZOFRAN) 4 MG tablet TAKE 1 TABLET EVERY 8 HOURS AS NEEDED FOR  NAUSEA AND VOMITING 20 tablet 0  . PARoxetine (PAXIL) 40 MG tablet Take 1 tablet (40 mg total) by mouth every morning. 30 tablet 5  . traMADol (ULTRAM) 50 MG tablet TAKE 1 TABLET BY MOUTH EVERY 6 HOURS AS NEEDED 60 tablet 0  . valsartan-hydrochlorothiazide (DIOVAN-HCT) 320-25 MG tablet Take 1 tablet by mouth daily. 90 tablet 1   No current facility-administered medications for this visit.     Review of Systems Review of Systems  Constitutional: Negative.   Respiratory: Negative.   Cardiovascular: Negative.     Blood pressure 130/76, pulse 78, resp. rate 12, height 5\' 1"  (1.549 m), weight 190 lb (86.2 kg).  Physical Exam Physical Exam  Constitutional: She is oriented to person, place, and time. She appears well-developed and well-nourished.  Eyes: Conjunctivae are normal. No scleral icterus.  Neck: Neck supple.  Cardiovascular: Normal rate, regular rhythm and normal heart sounds.   Pulmonary/Chest: Effort normal and breath sounds normal. Right breast exhibits no inverted nipple, no mass, no nipple discharge, no skin change and no tenderness. Left breast exhibits no inverted nipple, no mass, no nipple discharge, no skin change and no tenderness.    Lymphadenopathy:    She has no cervical adenopathy.    She has no axillary adenopathy.  Neurological: She is alert and oriented to person, place, and time.  Skin: Skin is warm.    Data Reviewed Mammogram reviewed   Assessment   Continue follow up with oncology S/P lumpectomy, radiation and chemotherapy left breast CA, triple negative in 2014 Stable exam    Plan    The patient has been asked to return to the office in one year with a bilateral diagnostic mammogram with Dr. Bary Castilla.        This information has been scribed by Gaspar Cola CMA.  Deanna Baker G 12/26/2016, 8:48 AM

## 2016-12-31 ENCOUNTER — Other Ambulatory Visit: Payer: Self-pay | Admitting: Family Medicine

## 2016-12-31 DIAGNOSIS — C50412 Malignant neoplasm of upper-outer quadrant of left female breast: Secondary | ICD-10-CM

## 2017-01-02 ENCOUNTER — Encounter: Payer: Self-pay | Admitting: Family Medicine

## 2017-01-02 ENCOUNTER — Ambulatory Visit (INDEPENDENT_AMBULATORY_CARE_PROVIDER_SITE_OTHER): Payer: BLUE CROSS/BLUE SHIELD | Admitting: Family Medicine

## 2017-01-02 VITALS — BP 124/66 | HR 73 | Temp 98.0°F | Resp 16 | Ht 61.0 in | Wt 192.4 lb

## 2017-01-02 DIAGNOSIS — R4189 Other symptoms and signs involving cognitive functions and awareness: Secondary | ICD-10-CM | POA: Diagnosis not present

## 2017-01-02 DIAGNOSIS — I10 Essential (primary) hypertension: Secondary | ICD-10-CM

## 2017-01-02 DIAGNOSIS — G43009 Migraine without aura, not intractable, without status migrainosus: Secondary | ICD-10-CM

## 2017-01-02 DIAGNOSIS — D8989 Other specified disorders involving the immune mechanism, not elsewhere classified: Secondary | ICD-10-CM | POA: Diagnosis not present

## 2017-01-02 DIAGNOSIS — M797 Fibromyalgia: Secondary | ICD-10-CM

## 2017-01-02 DIAGNOSIS — R5382 Chronic fatigue, unspecified: Secondary | ICD-10-CM

## 2017-01-02 DIAGNOSIS — F339 Major depressive disorder, recurrent, unspecified: Secondary | ICD-10-CM

## 2017-01-02 MED ORDER — HYDROCODONE-ACETAMINOPHEN 10-325 MG PO TABS: 1.0000 | ORAL_TABLET | Freq: Three times a day (TID) | ORAL | 0 refills | Status: DC | PRN

## 2017-01-02 NOTE — Progress Notes (Signed)
Name: Deanna Baker   MRN: [id number]    DOB: October 15, 1961   Date:01/02/2017       Progress Note  Subjective  Chief Complaint  Chief Complaint  Patient presents with  . Hypertension    3 month follow up  . Sleep Apnea  . Fibromyalgia  . Depression  . Chronic Kidney Disease    HPI  HTN: She is taking Valsartan HCTZ , bp has been well controlled.  She denies side effects of medication No chest pain or palpitation, denies dizziness  FMS: she continues to have daily pain, taking pain medications :gabapentin, and antidepressant, prn Tramadoland when the pain is so intense Hydrodocone. She has daily mental fogginess, trigger point pains.  Pain today is about average today at 4/10  CFIDS: still feels tired all the time, but symptoms waxes and wanes in severity.  Post-activity fatigue is severe. Off stimulants, but does not want to go back on medication   Reflux esophagitis: choking and also esophageal thickenes on CT chest/abdomen. Seen by Dr. Durwin Reges she had an EGD and esophageal dilation, and was diagnosed with reflux esophagitis and advised to increase PPI to twice daily. She tried coming off Omeprazole but symptoms got worse , saw Dr. Durwin Reges and she is on [place] now, she states the price if high but works well for her.   Major Depression: she is taking Paxil and Wellbutrin, she states symptoms are in remission, she has anhedonia, and has been worse recently.   ASCUS: discussed negative HPV and recheck in one year  Cognitive impairment: we will try to switch gabapentin at night only, she has been more forgetful, misplacing check book at home, and is forgetting what happened over the recent past.   OSA: she is wearing CPAP every night, all night, she has noticed a little more energy in am's. .  Migraine headaches: no recent episodes   Patient Active Problem List   Diagnosis Date Noted  . Chronic kidney disease, stage 3 09/23/2016  . Sleep apnea 07/11/2016  . ASCUS favor benign  07/10/2016  . Memory loss 05/12/2016  . Smell or taste sensation disturbance 05/12/2016  . Personal history of colonic polyps   . Benign neoplasm of transverse colon   . Rectal polyp   . Tenosynovitis of thumb 03/12/2016  . Bilateral knee pain 03/12/2016  . Reflux esophagitis   . Stricture and stenosis of esophagus   . Abnormal findings-gastrointestinal tract   . Thickening of esophagus 08/03/2015  . Choking 07/04/2015  . Benign essential HTN 05/01/2015  . CFIDS (chronic fatigue and immune dysfunction syndrome) ([place]) 05/01/2015  . Major depression, recurrent, chronic ([place]) 05/01/2015  . Dyslipidemia 05/01/2015  . Fibromyalgia syndrome 05/01/2015  . Blood glucose elevated 05/01/2015  . Eczema intertrigo 05/01/2015  . Iron deficiency anemia due to chronic blood loss 05/01/2015  . Migraine without aura and without status migrainosus, not intractable 05/01/2015  . Excessive urination at night 05/01/2015  . Dysmetabolic syndrome [id number]  . History of colonoscopy with polypectomy 05/01/2015  . Central sleep apnea 05/01/2015  . History of back surgery 05/01/2015  . Thickened nails 05/01/2015  . Malignant neoplasm of upper-outer quadrant of female breast, triple negative 06/13/2013    Past Surgical History:  Procedure Laterality Date  . BACK SURGERY    . BREAST BIOPSY Left 10/2012   +  . 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 BREAST LUMPECTOMY Left 12/06/12    lumpectomy with SN biopsy  and power port placement...  . COLONOSCOPY  2013   Dr. Suzette Battiest   . COLONOSCOPY WITH PROPOFOL N/A 04/28/2016   Procedure: COLONOSCOPY WITH PROPOFOL;  Surgeon: Lucilla Lame, MD;  Location: [place] [place];  Service: Endoscopy;  Laterality: N/A;  CPAP  . ESOPHAGOGASTRODUODENOSCOPY (EGD) WITH PROPOFOL N/A 10/23/2015   Procedure: ESOPHAGOGASTRODUODENOSCOPY (EGD) WITH PROPOFOL;  Surgeon: Lucilla Lame, MD;  Location: [facility];  Service: Endoscopy;  Laterality: N/A;  .  POLYPECTOMY  04/28/2016   Procedure: POLYPECTOMY;  Surgeon: Lucilla Lame, MD;  Location: [place];  Service: Endoscopy;;  . SHOULDER SURGERY Left 2009  . TONSILLECTOMY  1965  . TUBAL LIGATION  1982  . UPPER GI ENDOSCOPY     Dr. Suzette Battiest    Family History  Problem Relation Age of Onset  . Breast cancer Mother 29  . Breast cancer Maternal Aunt   . Brain cancer Paternal Uncle     x 2 mat uncles    Social History   Social History  . Marital status: Married    Spouse name: N/A  . Number of children: N/A  . Years of education: N/A   Occupational History  . Not on file.   Social History Main Topics  . Smoking status: Former Smoker    Packs/day: 1.00    Years: 20.00    Types: Cigarettes    Quit date: 11/24/1980  . Smokeless tobacco: Never Used  . Alcohol use 0.0 oz/week     Comment: rarely, 1-2x/mo  . Drug use: No  . Sexual activity: Yes    Partners: Male   Other Topics Concern  . Not on file   Social History Narrative  . No narrative on file     Current Outpatient Prescriptions:  .  ALPRAZolam (XANAX) 0.5 MG tablet, TAKE 1 TABLET BY MOUTH TWICE A DAY AS NEEDED FOR ANXIETY, Disp: 45 tablet, Rfl: 0 .  buPROPion (WELLBUTRIN XL) 300 MG 24 hr tablet, Take 1 tablet (300 mg total) by mouth daily., Disp: 30 tablet, Rfl: 5 .  dexlansoprazole (DEXILANT) 60 MG capsule, Take 1 capsule (60 mg total) by mouth daily., Disp: 30 capsule, Rfl: 6 .  docusate sodium (COLACE) 100 MG capsule, Take 100 mg by mouth 2 (two) times daily., Disp: , Rfl:  .  Elastic Bandages & Supports (THUMB BRACE) MISC, 1 Units by Does not apply route daily., Disp: 1 each, Rfl: 0 .  EPIPEN 2-PAK 0.3 MG/0.3ML SOAJ injection, Reported on 04/28/2016, Disp: , Rfl: 1 .  ferrous gluconate (FERGON) 324 MG tablet, TAKE 1 TABLET (324 MG TOTAL) BY MOUTH DAILY WITH BREAKFAST., Disp: 30 tablet, Rfl: 5 .  gabapentin (NEURONTIN) 400 MG capsule, TAKE 1 CAPSULE (400 MG TOTAL) BY MOUTH 3 (THREE) TIMES DAILY., Disp: 90  capsule, Rfl: 4 .  HYDROcodone-acetaminophen (NORCO) 10-325 MG tablet, Take 1 tablet by mouth 3 (three) times daily as needed. If patient does not respond to Tramadol To last 3 months, Disp: 45 tablet, Rfl: 0 .  Multiple Vitamins-Minerals (CENTRUM SILVER ADULT 50+ PO), Take by mouth., Disp: , Rfl:  .  Nerve Stimulator (STANDARD TENS) DEVI, 1 Units by Does not apply route daily., Disp: 1 Device, Rfl: 0 .  ondansetron (ZOFRAN) 4 MG tablet, TAKE 1 TABLET EVERY 8 HOURS AS NEEDED FOR NAUSEA AND VOMITING, Disp: 20 tablet, Rfl: 0 .  PARoxetine (PAXIL) 40 MG tablet, Take 1 tablet (40 mg total) by mouth every morning., Disp: 30 tablet, Rfl: 5 .  traMADol (ULTRAM) 50 MG tablet,  TAKE 1 TABLET BY MOUTH EVERY 6 HOURS AS NEEDED, Disp: 60 tablet, Rfl: 0 .  valsartan-hydrochlorothiazide (DIOVAN-HCT) 320-25 MG tablet, Take 1 tablet by mouth daily., Disp: 90 tablet, Rfl: 1  Allergies  Allergen Reactions  . Ace Inhibitors Swelling  . Pregabalin     weight gain and blurred vision  . Venlafaxine     foggy minded     ROS  Constitutional: Negative for fever or weight change.  Respiratory: Negative for cough and shortness of breath.   Cardiovascular: Negative for chest pain or palpitations.  Gastrointestinal: Negative for abdominal pain, no bowel changes.  Musculoskeletal: Negative for gait problem or joint swelling.  Skin: Negative for rash.  Neurological: Negative for dizziness, positive for intermittent  headache.  No other specific complaints in a complete review of systems (except as listed in HPI above).  Objective  Vitals:   01/02/17 1321 01/02/17 1323  BP:  124/66  Pulse:  73  Resp: 16 16  Temp: 98 F (36.7 C) 98 F (36.7 C)  TempSrc: Oral   SpO2:  97%  Weight:  192 lb 6 oz (87.3 kg)  Height:  5\' 1"  (1.549 m)    Body mass index is 36.35 kg/m.  Physical Exam   Constitutional: Patient appears well-developed and well-nourished. Obese  No distress.  HEENT: head atraumatic,  normocephalic, pupils equal and reactive to light, neck supple, throat within normal limits Cardiovascular: Normal rate, regular rhythm and normal heart sounds.  No murmur heard. No BLE edema. Pulmonary/Chest: Effort normal and breath sounds normal. No respiratory distress. Abdominal: Soft.  There is no tenderness. Psychiatric: Patient has a normal mood and affect. behavior is normal. Judgment and thought content normal. Muscular Skeletal: trigger points positive   PHQ2/9: Depression screen [facility] 2/9 01/02/2017 10/02/2016 07/11/2016 05/12/2016 04/07/2016  Decreased Interest 0 0 0 0 0  Down, Depressed, Hopeless 1 0 0 0 0  PHQ - 2 Score 1 0 0 0 0  Altered sleeping - - - - -  Tired, decreased energy - - - - -  Change in appetite - - - - -  Feeling bad or failure about yourself  - - - - -  Trouble concentrating - - - - -  Moving slowly or fidgety/restless - - - - -  Suicidal thoughts - - - - -  PHQ-9 Score - - - - -  Difficult doing work/chores - - - - -     Fall Risk: Fall Risk  01/02/2017 10/02/2016 07/11/2016 05/12/2016 04/07/2016  Falls in the past year? No No No No No  Number falls in past yr: - - - - -  Injury with Fall? - - - - -     Functional Status Survey: Is the patient deaf or have difficulty hearing?: No Does the patient have difficulty seeing, even when wearing glasses/contacts?: No Does the patient have difficulty concentrating, remembering, or making decisions?: No Does the patient have difficulty walking or climbing stairs?: No Does the patient have difficulty dressing or bathing?: No Does the patient have difficulty doing errands alone such as visiting a doctor's office or shopping?: No    Assessment & Plan  1. Benign essential HTN  Well controlled   2. CFIDS (chronic fatigue and immune dysfunction syndrome) ([facility])  Worse this time of the year  3. Fibromyalgia syndrome  - HYDROcodone-acetaminophen (NORCO) 10-325 MG tablet; Take 1 tablet by mouth 3 (three) times  daily as needed. If patient does not respond to Tramadol To  last 3 months  Dispense: 45 tablet; Refill: 0  4. Major depression, recurrent, chronic ([place])  Discussed evaluation by a counselor, but she can't afford it at this time  5. Migraine without aura and without status migrainosus, not intractable   6. Cognitive decline  We will change gabapentin to night time only, and see if symptoms improves otherwise we will refer her to neurologist for further evaluation

## 2017-01-27 ENCOUNTER — Other Ambulatory Visit: Payer: Self-pay | Admitting: Family Medicine

## 2017-01-27 DIAGNOSIS — F339 Major depressive disorder, recurrent, unspecified: Secondary | ICD-10-CM

## 2017-02-25 ENCOUNTER — Other Ambulatory Visit: Payer: Self-pay | Admitting: Family Medicine

## 2017-02-25 DIAGNOSIS — F339 Major depressive disorder, recurrent, unspecified: Secondary | ICD-10-CM

## 2017-02-26 NOTE — Telephone Encounter (Signed)
Patient requesting refill and has an upcoming appointment on 04/01/17.

## 2017-02-27 ENCOUNTER — Other Ambulatory Visit: Payer: Self-pay

## 2017-02-27 DIAGNOSIS — F339 Major depressive disorder, recurrent, unspecified: Secondary | ICD-10-CM

## 2017-02-27 NOTE — Telephone Encounter (Signed)
Patient requesting refill of Bupropion to [facility].  

## 2017-03-03 ENCOUNTER — Other Ambulatory Visit: Payer: Self-pay | Admitting: Family Medicine

## 2017-03-03 DIAGNOSIS — F339 Major depressive disorder, recurrent, unspecified: Secondary | ICD-10-CM

## 2017-03-19 ENCOUNTER — Inpatient Hospital Stay (HOSPITAL_BASED_OUTPATIENT_CLINIC_OR_DEPARTMENT_OTHER): Payer: BLUE CROSS/BLUE SHIELD | Admitting: Hematology and Oncology

## 2017-03-19 ENCOUNTER — Encounter: Payer: Self-pay | Admitting: Hematology and Oncology

## 2017-03-19 ENCOUNTER — Inpatient Hospital Stay: Payer: BLUE CROSS/BLUE SHIELD | Attending: Hematology and Oncology

## 2017-03-19 VITALS — BP 109/71 | HR 79 | Temp 96.5°F | Resp 18 | Wt 194.2 lb

## 2017-03-19 DIAGNOSIS — Z171 Estrogen receptor negative status [ER-]: Secondary | ICD-10-CM | POA: Diagnosis not present

## 2017-03-19 DIAGNOSIS — Z803 Family history of malignant neoplasm of breast: Secondary | ICD-10-CM | POA: Insufficient documentation

## 2017-03-19 DIAGNOSIS — Z923 Personal history of irradiation: Secondary | ICD-10-CM | POA: Insufficient documentation

## 2017-03-19 DIAGNOSIS — Z86010 Personal history of colonic polyps: Secondary | ICD-10-CM | POA: Diagnosis not present

## 2017-03-19 DIAGNOSIS — Z79899 Other long term (current) drug therapy: Secondary | ICD-10-CM | POA: Diagnosis not present

## 2017-03-19 DIAGNOSIS — C50412 Malignant neoplasm of upper-outer quadrant of left female breast: Secondary | ICD-10-CM

## 2017-03-19 DIAGNOSIS — K219 Gastro-esophageal reflux disease without esophagitis: Secondary | ICD-10-CM | POA: Diagnosis not present

## 2017-03-19 DIAGNOSIS — Z9221 Personal history of antineoplastic chemotherapy: Secondary | ICD-10-CM

## 2017-03-19 DIAGNOSIS — I10 Essential (primary) hypertension: Secondary | ICD-10-CM | POA: Diagnosis not present

## 2017-03-19 DIAGNOSIS — R5382 Chronic fatigue, unspecified: Secondary | ICD-10-CM | POA: Diagnosis not present

## 2017-03-19 DIAGNOSIS — M797 Fibromyalgia: Secondary | ICD-10-CM | POA: Insufficient documentation

## 2017-03-19 DIAGNOSIS — G4733 Obstructive sleep apnea (adult) (pediatric): Secondary | ICD-10-CM | POA: Diagnosis not present

## 2017-03-19 DIAGNOSIS — Z853 Personal history of malignant neoplasm of breast: Secondary | ICD-10-CM | POA: Diagnosis not present

## 2017-03-19 DIAGNOSIS — Z87891 Personal history of nicotine dependence: Secondary | ICD-10-CM | POA: Diagnosis not present

## 2017-03-19 DIAGNOSIS — R635 Abnormal weight gain: Secondary | ICD-10-CM

## 2017-03-19 DIAGNOSIS — D500 Iron deficiency anemia secondary to blood loss (chronic): Secondary | ICD-10-CM

## 2017-03-19 DIAGNOSIS — D509 Iron deficiency anemia, unspecified: Secondary | ICD-10-CM

## 2017-03-19 LAB — COMPREHENSIVE METABOLIC PANEL
ALT: 23 U/L (ref 14–54)
AST: 28 U/L (ref 15–41)
Albumin: 4.1 g/dL (ref 3.5–5.0)
Alkaline Phosphatase: 106 U/L (ref 38–126)
Anion gap: 7 (ref 5–15)
BUN: 17 mg/dL (ref 6–20)
CO2: 30 mmol/L (ref 22–32)
Calcium: 9.5 mg/dL (ref 8.9–10.3)
Chloride: 101 mmol/L (ref 101–111)
Creatinine, Ser: 1.05 mg/dL — ABNORMAL HIGH (ref 0.44–1.00)
GFR calc Af Amer: >60 mL/min (ref >60)
GFR calc non Af Amer: 58 mL/min — ABNORMAL LOW (ref >60)
Glucose, Bld: 135 mg/dL — ABNORMAL HIGH (ref 65–99)
Potassium: 3.8 mmol/L (ref 3.5–5.1)
Sodium: 138 mmol/L (ref 135–145)
Total Bilirubin: 0.6 mg/dL (ref 0.3–1.2)
Total Protein: 7.3 g/dL (ref 6.5–8.1)

## 2017-03-19 LAB — CBC WITH DIFFERENTIAL/PLATELET
Basophils Absolute: 0 10*3/uL (ref 0–0.1)
Basophils Relative: 0 %
Eosinophils Absolute: 0.1 10*3/uL (ref 0–0.7)
Eosinophils Relative: 3 %
HCT: 34.8 % — ABNORMAL LOW (ref 35.0–47.0)
Hemoglobin: 12 g/dL (ref 12.0–16.0)
Lymphocytes Relative: 16 %
Lymphs Abs: 0.9 10*3/uL — ABNORMAL LOW (ref 1.0–3.6)
MCH: 31.2 pg (ref 26.0–34.0)
MCHC: 34.6 g/dL (ref 32.0–36.0)
MCV: 90.1 fL (ref 80.0–100.0)
Monocytes Absolute: 0.3 10*3/uL (ref 0.2–0.9)
Monocytes Relative: 5 %
Neutro Abs: 4.4 10*3/uL (ref 1.4–6.5)
Neutrophils Relative %: 76 %
Platelets: 223 10*3/uL (ref 150–440)
RBC: 3.87 MIL/uL (ref 3.80–5.20)
RDW: 14 % (ref 11.5–14.5)
WBC: 5.8 10*3/uL (ref 3.6–11.0)

## 2017-03-19 LAB — T4, FREE: Free T4: 0.79 ng/dL (ref 0.61–1.12)

## 2017-03-19 LAB — FERRITIN: Ferritin: 36 ng/mL (ref 11–307)

## 2017-03-19 LAB — TSH: TSH: 3.2 u[IU]/mL (ref 0.350–4.500)

## 2017-03-19 NOTE — Progress Notes (Signed)
[facility] day:  03/19/2017  Chief Complaint: Deanna Baker is a 56 y.o. female with stage IA triple negative left breast cancer who is seen for 6 month assessment.  HPI:  The patient was last seen in the medical oncology clinic on 09/18/2016.  At that time, she was seen for initial assessment by me.  She denied any breast concerns.  Exam was unremarkable.  CBC revealed a hematocrit of 35.6, hemoglobin 12.3, and MCV 89.9.  Ferritin was 39. Creatinine was 1.18.  CA27.29 was 25.1.  Bilateral diagnostic mammogram on 12/17/2016 revealed no evidence of malignancy.  During the interim, she notes issues with chronic fatigue and fibromyalgia. She has a bad neck and disc.  She saw Dr. Jamal Collin on 12/24/2016.  She was doing well without recurrence.  She is scheduled to follow-up in 1 year with Dr. Bary Castilla.   Past Medical History:  Diagnosis Date  . Anemia   . Arthritis 1990  . Bronchitis 5/17  . Chronic fatigue syndrome   . Colon polyp 2013  . Fibromyalgia 1993  . GERD (gastroesophageal reflux disease)   . Heart murmur   . Hypertension 2012  . Lump or mass in breast   . Malignant neoplasm of upper-outer quadrant of female breast ([place])    Invasive mammary carcinoma, triple negative  . Measles   . Mumps 1973  . MVP (mitral valve prolapse)   . Obstructive sleep apnea on CPAP 11/27/15  . Shortness of breath dyspnea    chronic fatigue/fibromyalgia    Past Surgical History:  Procedure Laterality Date  . BACK SURGERY    . BREAST BIOPSY Left 10/2012   +  . 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 BREAST LUMPECTOMY Left 12/06/12    lumpectomy with SN biopsy and power port placement...  . COLONOSCOPY  2013   Dr. Suzette Battiest   . COLONOSCOPY WITH PROPOFOL N/A 04/28/2016   Procedure: COLONOSCOPY WITH PROPOFOL;  Surgeon: Lucilla Lame, MD;  Location: [place];  Service: Endoscopy;  Laterality: N/A;  CPAP  .  ESOPHAGOGASTRODUODENOSCOPY (EGD) WITH PROPOFOL N/A 10/23/2015   Procedure: ESOPHAGOGASTRODUODENOSCOPY (EGD) WITH PROPOFOL;  Surgeon: Lucilla Lame, MD;  Location: [facility];  Service: Endoscopy;  Laterality: N/A;  . POLYPECTOMY  04/28/2016   Procedure: POLYPECTOMY;  Surgeon: Lucilla Lame, MD;  Location: [place];  Service: Endoscopy;;  . SHOULDER SURGERY Left 2009  . TONSILLECTOMY  1965  . TUBAL LIGATION  1982  . UPPER GI ENDOSCOPY     Dr. Suzette Battiest    Family History  Problem Relation Age of Onset  . Breast cancer Mother 55  . Breast cancer Maternal Aunt   . Brain cancer Paternal Uncle     x 2 mat uncles    Social History:  reports that she quit smoking about 36 years ago. Her smoking use included Cigarettes. She has a 20.00 pack-year smoking history. She has never used smokeless tobacco. She reports that she drinks alcohol. She reports that she does not use drugs.  She has a 57 year old healthy daughter.  She lives in [place]. The patient is alone today.  Allergies:  Allergies  Allergen Reactions  . Ace Inhibitors Swelling  . Pregabalin     weight gain and blurred vision  . Venlafaxine     foggy minded    Current Medications: Current Outpatient Prescriptions  Medication Sig Dispense Refill  . ALPRAZolam (XANAX) 0.5 MG tablet TAKE  1 TABLET BY MOUTH TWICE A DAY AS NEEDED FOR ANXIETY 45 tablet 0  . buPROPion (WELLBUTRIN XL) 300 MG 24 hr tablet TAKE 1 TABLET (300 MG TOTAL) BY MOUTH DAILY. 30 tablet 5  . dexlansoprazole (DEXILANT) 60 MG capsule Take 1 capsule (60 mg total) by mouth daily. 30 capsule 6  . docusate sodium (COLACE) 100 MG capsule Take 100 mg by mouth 2 (two) times daily.    . Elastic Bandages & Supports (THUMB BRACE) MISC 1 Units by Does not apply route daily. 1 each 0  . EPIPEN 2-PAK 0.3 MG/0.3ML SOAJ injection Reported on 04/28/2016  1  . ferrous gluconate (FERGON) 324 MG tablet TAKE 1 TABLET (324 MG TOTAL) BY MOUTH DAILY WITH BREAKFAST. 30 tablet 5  .  gabapentin (NEURONTIN) 400 MG capsule TAKE 1 CAPSULE (400 MG TOTAL) BY MOUTH 3 (THREE) TIMES DAILY. 90 capsule 4  . HYDROcodone-acetaminophen (NORCO) 10-325 MG tablet Take 1 tablet by mouth 3 (three) times daily as needed. If patient does not respond to Tramadol To last 3 months 45 tablet 0  . Nerve Stimulator (STANDARD TENS) DEVI 1 Units by Does not apply route daily. 1 Device 0  . ondansetron (ZOFRAN) 4 MG tablet TAKE 1 TABLET EVERY 8 HOURS AS NEEDED FOR NAUSEA AND VOMITING 20 tablet 0  . PARoxetine (PAXIL) 40 MG tablet TAKE 1 TABLET (40 MG TOTAL) BY MOUTH EVERY MORNING. 30 tablet 5  . traMADol (ULTRAM) 50 MG tablet TAKE 1 TABLET BY MOUTH EVERY 6 HOURS AS NEEDED 60 tablet 0  . valsartan-hydrochlorothiazide (DIOVAN-HCT) 320-25 MG tablet Take 1 tablet by mouth daily. 90 tablet 1  . Multiple Vitamins-Minerals (CENTRUM SILVER ADULT 50+ PO) Take by mouth.     No current facility-administered medications for this visit.     Review of Systems:  GENERAL:  Feels "ok".  No fevers or sweats.  Weight up 7 pounds. PERFORMANCE STATUS (ECOG):  1 HEENT:  No visual changes, runny nose, sore throat, mouth sores or tenderness. Lungs: No shortness of breath or cough.  No hemoptysis.  On CPAP. Cardiac:  No chest pain, palpitations, orthopnea, or PND. GI:  No nausea, vomiting, diarrhea, constipation, melena or hematochezia. GU:  No urgency, frequency, dysuria, or hematuria. Musculoskeletal:  Fibromyalgia on Neurontin.  No back pain.  No joint pain.  No muscle tenderness. Extremities:  No pain or swelling. Skin:  Skin lesions pop up and scar. No rashes or skin changes. Neuro:  Poor memory.  No headache, numbness or weakness, balance or coordination issues. Endocrine:  No diabetes, thyroid issues, hot flashes or night sweats. Psych:  No mood changes, depression or anxiety. Pain:  No focal pain. Review of systems:  All other systems reviewed and found to be negative.  Physical Exam: Blood pressure 109/71,  pulse 79, temperature (!) 96.5 F (35.8 C), temperature source Tympanic, resp. rate 18, weight 194 lb 4 oz (88.1 kg). GENERAL:  Well developed, well nourished, woman sitting comfortably in the exam room in no acute distress. MENTAL STATUS:  Alert and oriented to person, place and time. HEAD: Dark red/auburn hair.  Normocephalic, atraumatic, face symmetric, no Cushingoid features. EYES:  Blue eyes.  Pupils equal round and reactive to light and accomodation.  No conjunctivitis or scleral icterus. ENT:  Oropharynx clear without lesion.  Tongue normal. Mucous membranes moist.  RESPIRATORY:  Clear to auscultation without rales, wheezes or rhonchi. CARDIOVASCULAR:  Regular rate and rhythm without murmur, rub or gallop. BREAST:  Right breast without masses, skin changes  or nipple discharge.  Left breast s/p lumpectomy with mild radiation changes.  No masses, skin changes or nipple discharge.  ABDOMEN:  Soft, non-tender, with active bowel sounds, and no hepatosplenomegaly.  No masses. SKIN:  Small hypopigmented spots on upper extremities.  No rashes, ulcers or lesions. EXTREMITIES: No edema, no skin discoloration or tenderness.  No palpable cords. LYMPH NODES: No palpable cervical, supraclavicular, axillary or inguinal adenopathy  NEUROLOGICAL: Unremarkable. PSYCH:  Appropriate.   Appointment on 03/19/2017  Component Date Value Ref Range Status  . WBC 03/19/2017 5.8  3.6 - 11.0 K/uL Final  . RBC 03/19/2017 3.87  3.80 - 5.20 MIL/uL Final  . Hemoglobin 03/19/2017 12.0  12.0 - 16.0 g/dL Final  . HCT 03/19/2017 34.8* 35.0 - 47.0 % Final  . MCV 03/19/2017 90.1  80.0 - 100.0 fL Final  . MCH 03/19/2017 31.2  26.0 - 34.0 pg Final  . MCHC 03/19/2017 34.6  32.0 - 36.0 g/dL Final  . RDW 03/19/2017 14.0  11.5 - 14.5 % Final  . Platelets 03/19/2017 223  150 - 440 K/uL Final  . Neutrophils Relative % 03/19/2017 76  % Final  . Neutro Abs 03/19/2017 4.4  1.4 - 6.5 K/uL Final  . Lymphocytes Relative 03/19/2017  16  % Final  . Lymphs Abs 03/19/2017 0.9* 1.0 - 3.6 K/uL Final  . Monocytes Relative 03/19/2017 5  % Final  . Monocytes Absolute 03/19/2017 0.3  0.2 - 0.9 K/uL Final  . Eosinophils Relative 03/19/2017 3  % Final  . Eosinophils Absolute 03/19/2017 0.1  0 - 0.7 K/uL Final  . Basophils Relative 03/19/2017 0  % Final  . Basophils Absolute 03/19/2017 0.0  0 - 0.1 K/uL Final  . Sodium 03/19/2017 138  135 - 145 mmol/L Final  . Potassium 03/19/2017 3.8  3.5 - 5.1 mmol/L Final  . Chloride 03/19/2017 101  101 - 111 mmol/L Final  . CO2 03/19/2017 30  22 - 32 mmol/L Final  . Glucose, Bld 03/19/2017 135* 65 - 99 mg/dL Final  . BUN 03/19/2017 17  6 - 20 mg/dL Final  . Creatinine, Ser 03/19/2017 1.05* 0.44 - 1.00 mg/dL Final  . Calcium 03/19/2017 9.5  8.9 - 10.3 mg/dL Final  . Total Protein 03/19/2017 7.3  6.5 - 8.1 g/dL Final  . Albumin 03/19/2017 4.1  3.5 - 5.0 g/dL Final  . AST 03/19/2017 28  15 - 41 U/L Final  . ALT 03/19/2017 23  14 - 54 U/L Final  . Alkaline Phosphatase 03/19/2017 106  38 - 126 U/L Final  . Total Bilirubin 03/19/2017 0.6  0.3 - 1.2 mg/dL Final  . GFR calc non Af Amer 03/19/2017 58* >60 mL/min Final  . GFR calc Af Amer 03/19/2017 >60  >60 mL/min Final   Comment: (NOTE) The eGFR has been calculated using the CKD EPI equation. This calculation has not been validated in all clinical situations. eGFR's persistently <60 mL/min signify possible Chronic Kidney Disease.   . Anion gap 03/19/2017 7  5 - 15 Final  . Ferritin 03/19/2017 36  11 - 307 ng/mL Final  . TSH 03/19/2017 3.200  0.350 - 4.500 uIU/mL Final  . Free T4 03/19/2017 0.79  0.61 - 1.12 ng/dL Final   Comment: (NOTE) Biotin ingestion may interfere with free T4 tests. If the results are inconsistent with the TSH level, previous test results, or the clinical presentation, then consider biotin interference. If needed, order repeat testing after stopping biotin.     Assessment:  Deanna  LYNSAY Baker is a 55 y.o. female with  stage IA triple negative left breast cancer s/p lumpectomy and sentinel lymph node biopsy on 12/06/2012.  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  Tumor was ER, PR and HER-2/neu negative. Sentinel left nodes were negative.  Pathologic stage was [id number].    BRCA mutation was negative in 11/2012.  She has a family history of breast cancer (mother at age 107 and maternal aunt).  She received Adriamycin and Cytoxan (AC) 4 from 12/17/2011 - 02/17/2013. She received weekly Taxol 12 from 03/10/2013 - 06/02/2013.  She received radiation.    CA27.29 has been monitored:  33.9 on 06/02/2013, 26.1 on 02/14/2014, 23.8 on 08/22/2014, 25.1 on 09/18/2016, and 23.4 on 03/19/2017.  Bilateral diagnostic mammogram on 12/14/2015 revealed expected density and architectural distortion with dystrophic calcifications in the upper outer quadrant of the left breast stable compared to prior studies.  There was left breast skin thickening, stable with post radiation change. There are no suspicious masses, malignant calcifications, sites of nonsurgical architectural distortions or assymmetries in either breast.  Bilateral diagnostic mammogram on 12/17/2016 revealed no evidence of malignancy.  She has a history of iron deficiency anemia.  She is on ferrous sulfate 325 mg a day.  Diet is good.  She denies any melena or hematochezia.  Hematocrit has improved from 30.4 on 03/18/2016 to 35.6 on 09/18/2016.   Ferritin was 39 on 09/18/2016.  Colonoscopy on 04/28/2016 revealed a 3 mm and 7 mm polyp in the transverse colon.  There was a 2 mm polyp in the rectum.  Repeat colonoscopy was recommended in 5 years for surveillance.  EGD on 10/23/2015 revealed benign-appearing esophageal stenosis which was dilated.  She had LA Grade B reflux esophagitis.  Stomach and duodenum was normal.  She was seen by Dr. Lucilla Lame on 08/26/2016 for reflux.  She was started on Dexilant 60 mg in the evening.  She  has fibromyalgia and is on gabapentin 400 mg twice a day.  Symptomatically, she denies any breast concerns.  She has gained weight.  Exam is stable.  Hematocrit is 34.8.  Ferritin is 36.  Plan: 1.  Labs today:  CBC with diff, CMP, CA27.29, ferritin, TSH, free T4. 2.  Review interval mammogram.  No evidence of malignancy. 3.  RTC in 6 months for MD assessment, labs (CBC with diff, CMP, CA27.29).   Lequita Asal, MD  03/19/2017, 11:32 AM

## 2017-03-19 NOTE — Progress Notes (Signed)
Patient offers no complaints today.  States she continues to have problems with her chronic fatigue and fibromyalgia.

## 2017-03-20 LAB — CANCER ANTIGEN 27.29: CA 27.29: 23.4 U/mL (ref 0.0–38.6)

## 2017-03-28 ENCOUNTER — Other Ambulatory Visit: Payer: Self-pay | Admitting: Gastroenterology

## 2017-04-01 ENCOUNTER — Ambulatory Visit (INDEPENDENT_AMBULATORY_CARE_PROVIDER_SITE_OTHER): Payer: BLUE CROSS/BLUE SHIELD | Admitting: Family Medicine

## 2017-04-01 ENCOUNTER — Encounter: Payer: Self-pay | Admitting: Family Medicine

## 2017-04-01 VITALS — BP 122/66 | HR 83 | Temp 97.7°F | Resp 16 | Ht 61.0 in | Wt 196.3 lb

## 2017-04-01 DIAGNOSIS — R5382 Chronic fatigue, unspecified: Secondary | ICD-10-CM

## 2017-04-01 DIAGNOSIS — E785 Hyperlipidemia, unspecified: Secondary | ICD-10-CM

## 2017-04-01 DIAGNOSIS — G43009 Migraine without aura, not intractable, without status migrainosus: Secondary | ICD-10-CM | POA: Diagnosis not present

## 2017-04-01 DIAGNOSIS — D8989 Other specified disorders involving the immune mechanism, not elsewhere classified: Secondary | ICD-10-CM

## 2017-04-01 DIAGNOSIS — M797 Fibromyalgia: Secondary | ICD-10-CM

## 2017-04-01 DIAGNOSIS — F339 Major depressive disorder, recurrent, unspecified: Secondary | ICD-10-CM | POA: Diagnosis not present

## 2017-04-01 DIAGNOSIS — K219 Gastro-esophageal reflux disease without esophagitis: Secondary | ICD-10-CM

## 2017-04-01 DIAGNOSIS — R600 Localized edema: Secondary | ICD-10-CM | POA: Diagnosis not present

## 2017-04-01 DIAGNOSIS — I10 Essential (primary) hypertension: Secondary | ICD-10-CM | POA: Diagnosis not present

## 2017-04-01 MED ORDER — PAROXETINE HCL 40 MG PO TABS: 40.0000 mg | ORAL_TABLET | ORAL | 1 refills | Status: DC

## 2017-04-01 MED ORDER — BUPROPION HCL ER (XL) 300 MG PO TB24: 300.0000 mg | ORAL_TABLET | Freq: Every day | ORAL | 1 refills | Status: DC

## 2017-04-01 MED ORDER — VALSARTAN-HYDROCHLOROTHIAZIDE 320-25 MG PO TABS: 1.0000 | ORAL_TABLET | Freq: Every day | ORAL | 1 refills | Status: DC

## 2017-04-01 NOTE — Progress Notes (Signed)
Name: Deanna Baker   MRN: [id number]    DOB: [birth year]   Date:04/01/2017       Progress Note  Subjective  Chief Complaint  Chief Complaint  Patient presents with  . Medication Refill    3 month F/U  . Hypertension    Headaches but thinks it is related to her neck pain.  . Chest Pain    Onset-Thursday bilateral rib pain, put pain cream on the area. The only thing she can think of it causing it to hurt was Wednesday and drag heavy box into another room.  . Fibromyalgia    HPI  Chest pain: symptoms resolved, she states she dragged a heavy box to another room in her house and developed chest wall pain, aggravated by moving or taking deep breath, she used topical medication and symptoms resolved. Likely muscular skeletal  HTN: she has been taking medication and no chest pain, palpitation or SOB. No side effects of medication  FMS: she continues to have daily pain, taking pain medications :gabapentin, and antidepressant, prn Hydrocodone ( Tramadol did not work for her). She has daily mental fogginess, trigger point pains. Pain today is about 3/10. Average pain 5-6/10  CFIDS: still feels tired all the time, but symptoms waxes and wanes in severity. Post-activity fatigue is severe. Off stimulants, but does not want to go back on medication   Reflux esophagitis: choking and also esophageal thickenes on CT chest/abdomen. Seen by Dr. Durwin Reges she had an EGD and esophageal dilation, and was diagnosed with reflux esophagitis and is still on Dexilant  Major Depression: she is taking Paxil and Wellbutrin, she states symptoms are not in remission, but feeling better, she has anhedonia, and sometimes feels extra tired.   ASCUS: discussed negative HPV and recheck in one year  Cognitive impairment: we will try to switch gabapentin at night only, she has been more forgetful, misplacing check book at home, and is forgetting what happened over the recent past.   OSA: she is wearing CPAP every  night, all night, she has noticed a little more energy in am's. .  Migraine headaches: no recent episodes    Patient Active Problem List   Diagnosis Date Noted  . Cognitive decline 01/02/2017  . Chronic kidney disease, stage 3 09/23/2016  . Sleep apnea 07/11/2016  . ASCUS favor benign 07/10/2016  . Memory loss 05/12/2016  . Smell or taste sensation disturbance 05/12/2016  . Personal history of colonic polyps   . Benign neoplasm of transverse colon   . Rectal polyp   . Tenosynovitis of thumb 03/12/2016  . Bilateral knee pain 03/12/2016  . Reflux esophagitis   . Stricture and stenosis of esophagus   . Abnormal findings-gastrointestinal tract   . Thickening of esophagus 08/03/2015  . Choking 07/04/2015  . Benign essential HTN 05/01/2015  . CFIDS (chronic fatigue and immune dysfunction syndrome) ([place]) 05/01/2015  . Major depression, recurrent, chronic ([place]) 05/01/2015  . Dyslipidemia 05/01/2015  . Fibromyalgia syndrome 05/01/2015  . Blood glucose elevated 05/01/2015  . Eczema intertrigo 05/01/2015  . Iron deficiency anemia due to chronic blood loss 05/01/2015  . Migraine without aura and without status migrainosus, not intractable 05/01/2015  . Excessive urination at night 05/01/2015  . Dysmetabolic syndrome 41/93/7902  . History of colonoscopy with polypectomy 05/01/2015  . Central sleep apnea 05/01/2015  . History of back surgery 05/01/2015  . Thickened nails 05/01/2015  . Malignant neoplasm of upper-outer quadrant of female breast, triple negative 06/13/2013    Past  Surgical History:  Procedure Laterality Date  . BACK SURGERY    . BREAST BIOPSY Left 10/2012   +  . 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 BREAST LUMPECTOMY Left 12/06/12    lumpectomy with SN biopsy and power port placement...  . COLONOSCOPY  2013   Dr. Tami Lin   . COLONOSCOPY WITH PROPOFOL N/A 04/28/2016   Procedure: COLONOSCOPY WITH PROPOFOL;  Surgeon: Midge Minium, MD;   Location: [facility];  Service: Endoscopy;  Laterality: N/A;  CPAP  . ESOPHAGOGASTRODUODENOSCOPY (EGD) WITH PROPOFOL N/A 10/23/2015   Procedure: ESOPHAGOGASTRODUODENOSCOPY (EGD) WITH PROPOFOL;  Surgeon: Midge Minium, MD;  Location: [facility];  Service: Endoscopy;  Laterality: N/A;  . POLYPECTOMY  04/28/2016   Procedure: POLYPECTOMY;  Surgeon: Midge Minium, MD;  Location: [facility];  Service: Endoscopy;;  . SHOULDER SURGERY Left 2009  . TONSILLECTOMY  1965  . TUBAL LIGATION  1982  . UPPER GI ENDOSCOPY     Dr. Tami Lin    Family History  Problem Relation Age of Onset  . Breast cancer Mother 61  . Breast cancer Maternal Aunt   . Brain cancer Paternal Uncle     x 2 mat uncles    Social History   Social History  . Marital status: Married    Spouse name: N/A  . Number of children: N/A  . Years of education: N/A   Occupational History  . Not on file.   Social History Main Topics  . Smoking status: Former Smoker    Packs/day: 1.00    Years: 20.00    Types: Cigarettes    Quit date: 11/24/1980  . Smokeless tobacco: Never Used  . Alcohol use 0.0 oz/week     Comment: rarely, 1-2x/mo  . Drug use: No  . Sexual activity: Yes    Partners: Male   Other Topics Concern  . Not on file   Social History Narrative  . No narrative on file     Current Outpatient Prescriptions:  .  ALPRAZolam (XANAX) 0.5 MG tablet, TAKE 1 TABLET BY MOUTH TWICE A DAY AS NEEDED FOR ANXIETY, Disp: 45 tablet, Rfl: 0 .  buPROPion (WELLBUTRIN XL) 300 MG 24 hr tablet, Take 1 tablet (300 mg total) by mouth daily., Disp: 90 tablet, Rfl: 1 .  dexlansoprazole (DEXILANT) 60 MG capsule, Take 1 capsule (60 mg total) by mouth daily., Disp: 30 capsule, Rfl: 6 .  docusate sodium (COLACE) 100 MG capsule, Take 100 mg by mouth 2 (two) times daily., Disp: , Rfl:  .  Elastic Bandages & Supports (THUMB BRACE) MISC, 1 Units by Does not apply route daily., Disp: 1 each, Rfl: 0 .  EPIPEN 2-PAK 0.3 MG/0.3ML  SOAJ injection, Reported on 04/28/2016, Disp: , Rfl: 1 .  ferrous gluconate (FERGON) 324 MG tablet, TAKE 1 TABLET (324 MG TOTAL) BY MOUTH DAILY WITH BREAKFAST., Disp: 30 tablet, Rfl: 5 .  gabapentin (NEURONTIN) 400 MG capsule, TAKE 1 CAPSULE (400 MG TOTAL) BY MOUTH 3 (THREE) TIMES DAILY., Disp: 90 capsule, Rfl: 4 .  HYDROcodone-acetaminophen (NORCO) 10-325 MG tablet, Take 1 tablet by mouth 3 (three) times daily as needed. If patient does not respond to Tramadol To last 3 months, Disp: 45 tablet, Rfl: 0 .  Multiple Vitamins-Minerals (CENTRUM SILVER ADULT 50+ PO), Take by mouth., Disp: , Rfl:  .  Nerve Stimulator (STANDARD TENS) DEVI, 1 Units by Does not apply route daily., Disp: 1 Device, Rfl: 0 .  ondansetron (ZOFRAN) 4  MG tablet, TAKE 1 TABLET EVERY 8 HOURS AS NEEDED FOR NAUSEA AND VOMITING, Disp: 20 tablet, Rfl: 0 .  PARoxetine (PAXIL) 40 MG tablet, Take 1 tablet (40 mg total) by mouth every morning., Disp: 90 tablet, Rfl: 1 .  valsartan-hydrochlorothiazide (DIOVAN-HCT) 320-25 MG tablet, Take 1 tablet by mouth daily., Disp: 90 tablet, Rfl: 1  Allergies  Allergen Reactions  . Ace Inhibitors Swelling  . Pregabalin     weight gain and blurred vision  . Venlafaxine     foggy minded     ROS  Constitutional: Negative for fever or weight change.  Respiratory: Negative for cough and shortness of breath.   Cardiovascular: Negative for chest pain or palpitations.  Gastrointestinal: Negative for abdominal pain, no bowel changes.  Musculoskeletal: Negative for gait problem or joint swelling.  Skin: Negative for rash.  Neurological: Negative for dizziness or headache.  No other specific complaints in a complete review of systems (except as listed in HPI above).  Objective  Vitals:   04/01/17 1338  BP: 122/66  Pulse: 83  Resp: 16  Temp: 97.7 F (36.5 C)  TempSrc: Oral  SpO2: 97%  Weight: 196 lb 4.8 oz (89 kg)  Height: '5\' 1"'$  (1.549 m)    Body mass index is 37.09 kg/m.  Physical  Exam  Constitutional: Patient appears well-developed and well-nourished. Obese  No distress.  HEENT: head atraumatic, normocephalic, pupils equal and reactive to light, neck supple, throat within normal limits Cardiovascular: Normal rate, regular rhythm and normal heart sounds.  No murmur heard. No BLE edema. Pulmonary/Chest: Effort normal and breath sounds normal. No respiratory distress. Abdominal: Soft.  There is no tenderness. Psychiatric: Patient has a normal mood and affect. behavior is normal. Judgment and thought content normal. Muscular Skeletal: trigger points positive   Recent Results (from the past 2160 hour(s))  CBC with Differential/Platelet     Status: Abnormal   Collection Time: 03/19/17 10:10 AM  Result Value Ref Range   WBC 5.8 3.6 - 11.0 K/uL   RBC 3.87 3.80 - 5.20 MIL/uL   Hemoglobin 12.0 12.0 - 16.0 g/dL   HCT 34.8 (L) 35.0 - 47.0 %   MCV 90.1 80.0 - 100.0 fL   MCH 31.2 26.0 - 34.0 pg   MCHC 34.6 32.0 - 36.0 g/dL   RDW 14.0 11.5 - 14.5 %   Platelets 223 150 - 440 K/uL   Neutrophils Relative % 76 %   Neutro Abs 4.4 1.4 - 6.5 K/uL   Lymphocytes Relative 16 %   Lymphs Abs 0.9 (L) 1.0 - 3.6 K/uL   Monocytes Relative 5 %   Monocytes Absolute 0.3 0.2 - 0.9 K/uL   Eosinophils Relative 3 %   Eosinophils Absolute 0.1 0 - 0.7 K/uL   Basophils Relative 0 %   Basophils Absolute 0.0 0 - 0.1 K/uL  Comprehensive metabolic panel     Status: Abnormal   Collection Time: 03/19/17 10:10 AM  Result Value Ref Range   Sodium 138 135 - 145 mmol/L   Potassium 3.8 3.5 - 5.1 mmol/L   Chloride 101 101 - 111 mmol/L   CO2 30 22 - 32 mmol/L   Glucose, Bld 135 (H) 65 - 99 mg/dL   BUN 17 6 - 20 mg/dL   Creatinine, Ser 1.05 (H) 0.44 - 1.00 mg/dL   Calcium 9.5 8.9 - 10.3 mg/dL   Total Protein 7.3 6.5 - 8.1 g/dL   Albumin 4.1 3.5 - 5.0 g/dL   AST 28 15 -  41 U/L   ALT 23 14 - 54 U/L   Alkaline Phosphatase 106 38 - 126 U/L   Total Bilirubin 0.6 0.3 - 1.2 mg/dL   GFR calc non Af Amer  58 (L) >60 mL/min   GFR calc Af Amer >60 >60 mL/min    Comment: (NOTE) The eGFR has been calculated using the CKD EPI equation. This calculation has not been validated in all clinical situations. eGFR's persistently <60 mL/min signify possible Chronic Kidney Disease.    Anion gap 7 5 - 15  Cancer antigen 27.29     Status: None   Collection Time: 03/19/17 10:10 AM  Result Value Ref Range   CA 27.29 23.4 0.0 - 38.6 U/mL    Comment: (NOTE) [employer]/ACS methodology Performed At: [facility] [place], [place] [id number] Lindon Romp MD ZM:[id number]   Ferritin     Status: None   Collection Time: 03/19/17 10:10 AM  Result Value Ref Range   Ferritin 36 11 - 307 ng/mL  TSH     Status: None   Collection Time: 03/19/17 10:10 AM  Result Value Ref Range   TSH 3.200 0.350 - 4.500 uIU/mL    Comment: Performed by a 3rd Generation assay with a functional sensitivity of <=0.01 uIU/mL.  T4, free     Status: None   Collection Time: 03/19/17 10:10 AM  Result Value Ref Range   Free T4 0.79 0.61 - 1.12 ng/dL    Comment: (NOTE) Biotin ingestion may interfere with free T4 tests. If the results are inconsistent with the TSH level, previous test results, or the clinical presentation, then consider biotin interference. If needed, order repeat testing after stopping biotin.      PHQ2/9: Depression screen [facility] 2/9 01/02/2017 10/02/2016 07/11/2016 05/12/2016 04/07/2016  Decreased Interest 0 0 0 0 0  Down, Depressed, Hopeless 1 0 0 0 0  PHQ - 2 Score 1 0 0 0 0  Altered sleeping - - - - -  Tired, decreased energy - - - - -  Change in appetite - - - - -  Feeling bad or failure about yourself  - - - - -  Trouble concentrating - - - - -  Moving slowly or fidgety/restless - - - - -  Suicidal thoughts - - - - -  PHQ-9 Score - - - - -  Difficult doing work/chores - - - - -    Fall Risk: Fall Risk  01/02/2017 10/02/2016 07/11/2016 05/12/2016 04/07/2016  Falls in the past year?  No No No No No  Number falls in past yr: - - - - -  Injury with Fall? - - - - -     Assessment & Plan  1. Benign essential HTN  Well controlled with medication  - valsartan-hydrochlorothiazide (DIOVAN-HCT) 320-25 MG tablet; Take 1 tablet by mouth daily.  Dispense: 90 tablet; Refill: 1  2. CFIDS (chronic fatigue and immune dysfunction syndrome) ([facility])  She still has daily fatigue  3. Fibromyalgia syndrome  stable  4. Major depression, recurrent, chronic ([facility])  She does not want to change medication - PARoxetine (PAXIL) 40 MG tablet; Take 1 tablet (40 mg total) by mouth every morning.  Dispense: 90 tablet; Refill: 1 - buPROPion (WELLBUTRIN XL) 300 MG 24 hr tablet; Take 1 tablet (300 mg total) by mouth daily.  Dispense: 90 tablet; Refill: 1  5. Migraine without aura and without status migrainosus, not intractable  Doing well at this time  6. Gastro-esophageal  reflux disease without esophagitis  Doing well at this time  7. Dyslipidemia  On life style modification  8. Bilateral edema of lower extremity  - valsartan-hydrochlorothiazide (DIOVAN-HCT) 320-25 MG tablet; Take 1 tablet by mouth daily.  Dispense: 90 tablet; Refill: 1

## 2017-04-03 ENCOUNTER — Encounter: Payer: Self-pay | Admitting: *Deleted

## 2017-04-09 ENCOUNTER — Other Ambulatory Visit: Payer: Self-pay

## 2017-04-09 MED ORDER — DEXLANSOPRAZOLE 60 MG PO CPDR: 60.0000 mg | DELAYED_RELEASE_CAPSULE | Freq: Every day | ORAL | 3 refills | Status: DC

## 2017-04-24 ENCOUNTER — Other Ambulatory Visit: Payer: Self-pay

## 2017-04-24 DIAGNOSIS — M797 Fibromyalgia: Secondary | ICD-10-CM

## 2017-04-24 MED ORDER — GABAPENTIN 400 MG PO CAPS
400.0000 mg | ORAL_CAPSULE | Freq: Every day | ORAL | 1 refills | Status: DC
Start: 2017-04-24 — End: 2017-06-13

## 2017-04-24 NOTE — Telephone Encounter (Signed)
Patient requesting refill of Gabapentin to [facility] for a 90 day supply.

## 2017-04-24 NOTE — Telephone Encounter (Signed)
Please call and remind patient and let her know that I have reviewed her chart and per Dr. Ancil Boozer' note, she is to be taking Gabapentin at night only, and I have provided the refill accordingly. If she has any questions, please let me know. Thank you!

## 2017-04-28 ENCOUNTER — Telehealth: Payer: Self-pay | Admitting: Family Medicine

## 2017-04-28 NOTE — Telephone Encounter (Signed)
LMOM to discuss medication with patient. I wrote for 400mg  once daily, and it should have been written as 3x/day.  Will await call back from patient to know if she has already had Rx filled. If so, I will prescribe enough to make up the difference.

## 2017-04-28 NOTE — Telephone Encounter (Signed)
Pt requesting return call. Normally patient takes gabapentin 3x daily. However a prescription for it was sent in for once daily. She would like to know if Dr Ancil Boozer had changes the dosage? If not please send corrected prescription to [facility] Abbott Pao can be reached at [phone]

## 2017-04-29 NOTE — Telephone Encounter (Signed)
Please call patient back and ask if she has already filled the Rx I sent in. If not, I will correct the Rx and resend. If she has, I will send in a new Rx to make up for the amount she needs in order to take the medication 3 times a day. Please apologize for any inconvenience. Thank you!

## 2017-04-30 ENCOUNTER — Encounter: Payer: Self-pay | Admitting: Family Medicine

## 2017-04-30 NOTE — Telephone Encounter (Signed)
Called [facility] to give verbal order to change to [facility].

## 2017-04-30 NOTE — Telephone Encounter (Signed)
Pt called back stating she is suppose to take 3 a day. Pt understands a new RX will be called in to make up for what she does not have.

## 2017-05-25 ENCOUNTER — Other Ambulatory Visit: Payer: Self-pay | Admitting: Family Medicine

## 2017-05-25 DIAGNOSIS — M797 Fibromyalgia: Secondary | ICD-10-CM

## 2017-05-25 NOTE — Telephone Encounter (Signed)
PATIENT NEEDS REFILL ON HYDROCODONE.

## 2017-05-26 MED ORDER — HYDROCODONE-ACETAMINOPHEN 10-325 MG PO TABS: 1.0000 | ORAL_TABLET | Freq: Three times a day (TID) | ORAL | 0 refills | Status: DC | PRN

## 2017-06-09 ENCOUNTER — Encounter: Payer: Self-pay | Admitting: Family Medicine

## 2017-06-10 ENCOUNTER — Other Ambulatory Visit: Payer: Self-pay | Admitting: Family Medicine

## 2017-06-10 MED ORDER — OLMESARTAN MEDOXOMIL-HCTZ 40-25 MG PO TABS: 1.0000 | ORAL_TABLET | Freq: Every day | ORAL | 1 refills | Status: DC

## 2017-06-13 ENCOUNTER — Other Ambulatory Visit: Payer: Self-pay | Admitting: Family Medicine

## 2017-06-13 DIAGNOSIS — M797 Fibromyalgia: Secondary | ICD-10-CM

## 2017-06-24 ENCOUNTER — Other Ambulatory Visit: Payer: Self-pay | Admitting: Family Medicine

## 2017-06-24 DIAGNOSIS — C50412 Malignant neoplasm of upper-outer quadrant of left female breast: Secondary | ICD-10-CM

## 2017-06-24 NOTE — Telephone Encounter (Signed)
Patient requesting refill of [place] to [facility].

## 2017-07-03 ENCOUNTER — Ambulatory Visit (INDEPENDENT_AMBULATORY_CARE_PROVIDER_SITE_OTHER): Payer: BLUE CROSS/BLUE SHIELD | Admitting: Family Medicine

## 2017-07-03 ENCOUNTER — Ambulatory Visit: Payer: BLUE CROSS/BLUE SHIELD | Admitting: Family Medicine

## 2017-07-03 ENCOUNTER — Encounter: Payer: Self-pay | Admitting: Family Medicine

## 2017-07-03 VITALS — BP 122/67 | HR 86 | Temp 98.2°F | Resp 16 | Ht 61.0 in | Wt 194.9 lb

## 2017-07-03 DIAGNOSIS — E785 Hyperlipidemia, unspecified: Secondary | ICD-10-CM

## 2017-07-03 DIAGNOSIS — R5382 Chronic fatigue, unspecified: Secondary | ICD-10-CM

## 2017-07-03 DIAGNOSIS — K219 Gastro-esophageal reflux disease without esophagitis: Secondary | ICD-10-CM

## 2017-07-03 DIAGNOSIS — G4733 Obstructive sleep apnea (adult) (pediatric): Secondary | ICD-10-CM

## 2017-07-03 DIAGNOSIS — D8989 Other specified disorders involving the immune mechanism, not elsewhere classified: Secondary | ICD-10-CM | POA: Diagnosis not present

## 2017-07-03 DIAGNOSIS — I10 Essential (primary) hypertension: Secondary | ICD-10-CM | POA: Diagnosis not present

## 2017-07-03 DIAGNOSIS — G43009 Migraine without aura, not intractable, without status migrainosus: Secondary | ICD-10-CM

## 2017-07-03 DIAGNOSIS — Z23 Encounter for immunization: Secondary | ICD-10-CM | POA: Diagnosis not present

## 2017-07-03 DIAGNOSIS — F339 Major depressive disorder, recurrent, unspecified: Secondary | ICD-10-CM | POA: Diagnosis not present

## 2017-07-03 DIAGNOSIS — M797 Fibromyalgia: Secondary | ICD-10-CM | POA: Diagnosis not present

## 2017-07-03 LAB — LIPID PANEL
CHOL/HDL RATIO: 5.2 ratio — AB (ref <5.0)
Cholesterol: 237 mg/dL — ABNORMAL HIGH (ref <200)
HDL: 46 mg/dL — ABNORMAL LOW (ref >50)
LDL Cholesterol: 153 mg/dL — ABNORMAL HIGH (ref <100)
Triglycerides: 190 mg/dL — ABNORMAL HIGH (ref <150)
VLDL: 38 mg/dL — ABNORMAL HIGH (ref <30)

## 2017-07-03 MED ORDER — GABAPENTIN 400 MG PO CAPS: 1200.0000 mg | ORAL_CAPSULE | Freq: Three times a day (TID) | ORAL | 0 refills | Status: DC

## 2017-07-03 MED ORDER — ONDANSETRON HCL 4 MG PO TABS: ORAL_TABLET | ORAL | 0 refills | Status: DC

## 2017-07-03 MED ORDER — GABAPENTIN 400 MG PO CAPS: ORAL_CAPSULE | ORAL | 0 refills | Status: DC

## 2017-07-03 MED ORDER — ALPRAZOLAM 0.5 MG PO TABS: ORAL_TABLET | ORAL | 0 refills | Status: DC

## 2017-07-03 MED ORDER — PAROXETINE HCL 40 MG PO TABS: 40.0000 mg | ORAL_TABLET | ORAL | 1 refills | Status: DC

## 2017-07-03 MED ORDER — DEXLANSOPRAZOLE 60 MG PO CPDR: 60.0000 mg | DELAYED_RELEASE_CAPSULE | Freq: Every day | ORAL | 1 refills | Status: DC

## 2017-07-03 NOTE — Progress Notes (Signed)
Name: Deanna Baker   MRN: [id number]    DOB: 1960/12/12   Date:07/03/2017       Progress Note  Subjective  Chief Complaint  Chief Complaint  Patient presents with  . Follow-up    3 mo  . Gastroesophageal Reflux    HPI  HTN: she has been taking medication and no chest pain, palpitation or SOB. No side effects of medication, she was on Valsartan but we switched to Benicar HCTZ because of recall of Valsartan   FMS: she continues to have daily pain, taking pain medications :gabapentin, and antidepressant, prn Hydrocodone ( Tramadol did not work for her) and sometimes with Zofran to control nausea. She has daily mental fogginess, trigger point pains and generalized body aches.  Pain today is about 3/10. Average pain 3-4/10  CFIDS: still feels tired all the time, but symptoms waxes and wanes in severity. Post-activity fatigue is severe. Off stimulants, but does not want to go back on medication She needs to nap most  days - usually 2 hours.   Reflux esophagitis: choking and also esophageal thickenes on CT chest/abdomen. Seen by Dr. Durwin Reges she had an EGD and esophageal dilation, and was diagnosed with reflux esophagitis and is still on Dexilant, She states that symptoms are well controlled with medication, if she skips medication she has regurgitation at night and burning sensation  Major Depression: she is taking Paxil and Wellbutrin, she states symptoms are not in remission,she has anhedonia, and sometimes feels extra tired. She states sadness is not as intense. She still goes to church, takes care of her house and volunteers for her church.   ASCUS: discussed negative HPV and recheck in one year  Cognitive impairment: she is taking one gabapentin in am and two at night  ( she states she prefers not stopping the morning dose because it helps with pain) , she has been more forgetful, misplacing check book at home, lost her prescription glasses, offered her to see Neurologist but she  wants to hold off for now.   OSA: she is wearing CPAP every night, all night. She tolerated mask well and it helps her with energy level in morning  Migraine headaches: no recent episodes    Patient Active Problem List   Diagnosis Date Noted  . Cognitive decline 01/02/2017  . Chronic kidney disease, stage 3 09/23/2016  . Sleep apnea 07/11/2016  . ASCUS favor benign 07/10/2016  . Memory loss 05/12/2016  . Smell or taste sensation disturbance 05/12/2016  . Personal history of colonic polyps   . Benign neoplasm of transverse colon   . Rectal polyp   . Tenosynovitis of thumb 03/12/2016  . Bilateral knee pain 03/12/2016  . Reflux esophagitis   . Stricture and stenosis of esophagus   . Abnormal findings-gastrointestinal tract   . Thickening of esophagus 08/03/2015  . Choking 07/04/2015  . Benign essential HTN 05/01/2015  . CFIDS (chronic fatigue and immune dysfunction syndrome) ([place]) 05/01/2015  . Major depression, recurrent, chronic ([place]) 05/01/2015  . Dyslipidemia 05/01/2015  . Fibromyalgia syndrome 05/01/2015  . Blood glucose elevated 05/01/2015  . Eczema intertrigo 05/01/2015  . Iron deficiency anemia due to chronic blood loss 05/01/2015  . Migraine without aura and without status migrainosus, not intractable 05/01/2015  . Excessive urination at night 05/01/2015  . Dysmetabolic syndrome 40/08/2724  . History of colonoscopy with polypectomy 05/01/2015  . Central sleep apnea 05/01/2015  . History of back surgery 05/01/2015  . Thickened nails 05/01/2015  . Malignant neoplasm  of upper-outer quadrant of female breast, triple negative 06/13/2013    Past Surgical History:  Procedure Laterality Date  . BACK SURGERY    . BREAST BIOPSY Left 10/2012   +  . 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 BREAST LUMPECTOMY Left 12/06/12    lumpectomy with SN biopsy and power port placement...  . COLONOSCOPY  2013   Dr. Suzette Battiest   . COLONOSCOPY WITH PROPOFOL  N/A 04/28/2016   Procedure: COLONOSCOPY WITH PROPOFOL;  Surgeon: Lucilla Lame, MD;  Location: [place];  Service: Endoscopy;  Laterality: N/A;  CPAP  . ESOPHAGOGASTRODUODENOSCOPY (EGD) WITH PROPOFOL N/A 10/23/2015   Procedure: ESOPHAGOGASTRODUODENOSCOPY (EGD) WITH PROPOFOL;  Surgeon: Lucilla Lame, MD;  Location: [facility];  Service: Endoscopy;  Laterality: N/A;  . POLYPECTOMY  04/28/2016   Procedure: POLYPECTOMY;  Surgeon: Lucilla Lame, MD;  Location: [place];  Service: Endoscopy;;  . SHOULDER SURGERY Left 2009  . TONSILLECTOMY  1965  . TUBAL LIGATION  1982  . UPPER GI ENDOSCOPY     Dr. Suzette Battiest    Family History  Problem Relation Age of Onset  . Breast cancer Mother 69  . Breast cancer Maternal Aunt   . Brain cancer Paternal Uncle        x 2 mat uncles    Social History   Social History  . Marital status: Married    Spouse name: N/A  . Number of children: N/A  . Years of education: N/A   Occupational History  . Not on file.   Social History Main Topics  . Smoking status: Former Smoker    Packs/day: 1.00    Years: 20.00    Types: Cigarettes    Quit date: 11/24/1980  . Smokeless tobacco: Never Used  . Alcohol use 0.0 oz/week     Comment: rarely, 1-2x/mo  . Drug use: No  . Sexual activity: Yes    Partners: Male   Other Topics Concern  . Not on file   Social History Narrative  . No narrative on file     Current Outpatient Prescriptions:  .  ALPRAZolam (XANAX) 0.5 MG tablet, TAKE 1 TABLET BY MOUTH TWICE A DAY AS NEEDED FOR ANXIETY, Disp: 30 tablet, Rfl: 0 .  buPROPion (WELLBUTRIN XL) 300 MG 24 hr tablet, Take 1 tablet (300 mg total) by mouth daily., Disp: 90 tablet, Rfl: 1 .  dexlansoprazole (DEXILANT) 60 MG capsule, Take 1 capsule (60 mg total) by mouth daily., Disp: 90 capsule, Rfl: 1 .  docusate sodium (COLACE) 100 MG capsule, Take 100 mg by mouth 2 (two) times daily., Disp: , Rfl:  .  Elastic Bandages & Supports (THUMB BRACE) MISC, 1 Units  by Does not apply route daily., Disp: 1 each, Rfl: 0 .  EPIPEN 2-PAK 0.3 MG/0.3ML SOAJ injection, Reported on 04/28/2016, Disp: , Rfl: 1 .  ferrous gluconate (FERGON) 324 MG tablet, TAKE 1 TABLET BY MOUTH EVERY DAY WITH BREAKFAST, Disp: 30 tablet, Rfl: 5 .  gabapentin (NEURONTIN) 400 MG capsule, Take 3 capsules (1,200 mg total) by mouth 3 (three) times daily., Disp: 90 capsule, Rfl: 0 .  HYDROcodone-acetaminophen (NORCO) 10-325 MG tablet, Take 1 tablet by mouth 3 (three) times daily as needed. If patient does not respond to Tramadol To last 3 months, Disp: 45 tablet, Rfl: 0 .  Multiple Vitamins-Minerals (CENTRUM SILVER ADULT 50+ PO), Take by mouth., Disp: , Rfl:  .  Nerve Stimulator (STANDARD TENS) DEVI, 1 Units by Does  not apply route daily., Disp: 1 Device, Rfl: 0 .  olmesartan-hydrochlorothiazide (BENICAR HCT) 40-25 MG tablet, Take 1 tablet by mouth daily., Disp: 90 tablet, Rfl: 1 .  ondansetron (ZOFRAN) 4 MG tablet, TAKE 1 TABLET EVERY 8 HOURS AS NEEDED FOR NAUSEA AND VOMITING, Disp: 20 tablet, Rfl: 0 .  PARoxetine (PAXIL) 40 MG tablet, Take 1 tablet (40 mg total) by mouth every morning., Disp: 90 tablet, Rfl: 1  Allergies  Allergen Reactions  . Ace Inhibitors Swelling  . Pregabalin     weight gain and blurred vision  . Venlafaxine     foggy minded     ROS  Constitutional: Negative for fever or weight change.  Respiratory: Negative for cough and shortness of breath.   Cardiovascular: Negative for chest pain or palpitations.  Gastrointestinal: Negative for abdominal pain, no bowel changes.  Musculoskeletal: Negative for gait problem or joint swelling. Positive for myalgaia Skin: Positive  for rash ( eczema on hands) .  Neurological: Negative for dizziness , positive for intermittent  headache.  No other specific complaints in a complete review of systems (except as listed in HPI above).  Objective  Vitals:   07/03/17 0807  BP: 122/67  Pulse: 86  Resp: 16  Temp: 98.2 F (36.8  C)  TempSrc: Oral  SpO2: 98%  Weight: 194 lb 14.4 oz (88.4 kg)  Height: 5\' 1"  (1.549 m)    Body mass index is 36.83 kg/m.  Physical Exam  Constitutional: Patient appears well-developed and well-nourished. Obese  No distress.  HEENT: head atraumatic, normocephalic, pupils equal and reactive to light,neck supple, throat within normal limits Cardiovascular: Normal rate, regular rhythm and normal heart sounds.  No murmur heard. No BLE edema. Pulmonary/Chest: Effort normal and breath sounds normal. No respiratory distress. Abdominal: Soft.  There is no tenderness. Psychiatric: Patient has a normal mood and affect. behavior is normal. Judgment and thought content normal. Muscular Skeletal: tender trigger points.   PHQ2/9: Depression screen [facility] 2/9 07/03/2017 01/02/2017 10/02/2016 07/11/2016 05/12/2016  Decreased Interest 0 0 0 0 0  Down, Depressed, Hopeless 0 1 0 0 0  PHQ - 2 Score 0 1 0 0 0  Altered sleeping - - - - -  Tired, decreased energy - - - - -  Change in appetite - - - - -  Feeling bad or failure about yourself  - - - - -  Trouble concentrating - - - - -  Moving slowly or fidgety/restless - - - - -  Suicidal thoughts - - - - -  PHQ-9 Score - - - - -  Difficult doing work/chores - - - - -     Fall Risk: Fall Risk  07/03/2017 01/02/2017 10/02/2016 07/11/2016 05/12/2016  Falls in the past year? No No No No No  Comment - - - - patient stated that she has not fallen since her last visit  Number falls in past yr: - - - - -  Comment - - - - -  Injury with Fall? - - - - -     Functional Status Survey: Is the patient deaf or have difficulty hearing?: No Does the patient have difficulty seeing, even when wearing glasses/contacts?: Yes (glasses) Does the patient have difficulty concentrating, remembering, or making decisions?: No Does the patient have difficulty walking or climbing stairs?: No Does the patient have difficulty dressing or bathing?: No Does the patient have  difficulty doing errands alone such as visiting a doctor's office or shopping?: No  Assessment & Plan  1. Benign essential HTN  Well controlled, continue medication  2. CFIDS (chronic fatigue and immune dysfunction syndrome) (HCC)  stable  3. Fibromyalgia syndrome  - gabapentin (NEURONTIN) 400 MG capsule; Take 3 capsules (1,200 mg total) by mouth 3 (three) times daily.  Dispense: 90 capsule; Refill: 0  4. Major depression, recurrent, chronic (HCC)  - ALPRAZolam (XANAX) 0.5 MG tablet; TAKE 1 TABLET BY MOUTH TWICE A DAY AS NEEDED FOR ANXIETY  Dispense: 30 tablet; Refill: 0 - PARoxetine (PAXIL) 40 MG tablet; Take 1 tablet (40 mg total) by mouth every morning.  Dispense: 90 tablet; Refill: 1  5. Migraine without aura and without status migrainosus, not intractable  - ondansetron (ZOFRAN) 4 MG tablet; TAKE 1 TABLET EVERY 8 HOURS AS NEEDED FOR NAUSEA AND VOMITING  Dispense: 20 tablet; Refill: 0  6. Gastro-esophageal reflux disease without esophagitis  - dexlansoprazole (DEXILANT) 60 MG capsule; Take 1 capsule (60 mg total) by mouth daily.  Dispense: 90 capsule; Refill: 1  7. Dyslipidemia  - Lipid panel  8. Obstructive sleep apnea syndrome  Compliant with CPAP   9. Need for vaccination for Strep pneumoniae  - Pneumococcal conjugate vaccine 13-valent IM

## 2017-07-29 ENCOUNTER — Other Ambulatory Visit: Payer: Self-pay | Admitting: Family Medicine

## 2017-07-29 ENCOUNTER — Other Ambulatory Visit: Payer: Self-pay | Admitting: Gastroenterology

## 2017-07-29 DIAGNOSIS — M797 Fibromyalgia: Secondary | ICD-10-CM

## 2017-07-29 NOTE — Telephone Encounter (Signed)
Patient requesting refill of Gabapentin to [facility].

## 2017-08-19 ENCOUNTER — Ambulatory Visit (INDEPENDENT_AMBULATORY_CARE_PROVIDER_SITE_OTHER): Payer: BLUE CROSS/BLUE SHIELD

## 2017-08-19 DIAGNOSIS — Z23 Encounter for immunization: Secondary | ICD-10-CM | POA: Diagnosis not present

## 2017-08-28 ENCOUNTER — Other Ambulatory Visit: Payer: Self-pay | Admitting: Family Medicine

## 2017-08-28 DIAGNOSIS — M797 Fibromyalgia: Secondary | ICD-10-CM

## 2017-08-28 NOTE — Telephone Encounter (Signed)
Patient requesting refill of Gabapentin to [facility].

## 2017-09-20 NOTE — Progress Notes (Signed)
[facility] day:  09/20/2017  Chief Complaint: Deanna Baker is a 56 y.o. female with stage IA triple negative left breast cancer who is seen for 6 month assessment.  HPI:  The patient was last seen in the medical oncology clinic on 03/19/2017.  At that time, she denied any breast concerns.  She had gained weight.  Exam was stable.  Hematocrit was 34.8.  Ferritin was 36.  TSH and free T4 were normal.  During the interim, she has done well.  She notes waking up with crampy abdominal discomfort.  She has had some nausea and 2 bowel movements today.  She has not been around anyone sick.  She denies any change in diet.  She denies any breast concerns.   Past Medical History:  Diagnosis Date  . Anemia   . Arthritis 1990  . Bronchitis 5/17  . Chronic fatigue syndrome   . Colon polyp 2013  . Fibromyalgia 1993  . GERD (gastroesophageal reflux disease)   . Heart murmur   . Hypertension 2012  . Lump or mass in breast   . Malignant neoplasm of upper-outer quadrant of female breast ([place])    Invasive mammary carcinoma, triple negative  . Measles   . Mumps 1973  . MVP (mitral valve prolapse)   . Obstructive sleep apnea on CPAP 11/27/15  . Shortness of breath dyspnea    chronic fatigue/fibromyalgia    Past Surgical History:  Procedure Laterality Date  . BACK SURGERY    . BREAST BIOPSY Left 10/2012   +  . 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 BREAST LUMPECTOMY Left 12/06/12    lumpectomy with SN biopsy and power port placement...  . COLONOSCOPY  2013   Dr. Suzette Battiest   . COLONOSCOPY WITH PROPOFOL N/A 04/28/2016   Procedure: COLONOSCOPY WITH PROPOFOL;  Surgeon: Lucilla Lame, MD;  Location: [place];  Service: Endoscopy;  Laterality: N/A;  CPAP  . ESOPHAGOGASTRODUODENOSCOPY (EGD) WITH PROPOFOL N/A 10/23/2015   Procedure: ESOPHAGOGASTRODUODENOSCOPY (EGD) WITH PROPOFOL;  Surgeon: Lucilla Lame, MD;  Location: [facility]  ENDOSCOPY;  Service: Endoscopy;  Laterality: N/A;  . POLYPECTOMY  04/28/2016   Procedure: POLYPECTOMY;  Surgeon: Lucilla Lame, MD;  Location: [place];  Service: Endoscopy;;  . SHOULDER SURGERY Left 2009  . TONSILLECTOMY  1965  . TUBAL LIGATION  1982  . UPPER GI ENDOSCOPY     Dr. Suzette Battiest    Family History  Problem Relation Age of Onset  . Breast cancer Mother 36  . Breast cancer Maternal Aunt   . Brain cancer Paternal Uncle        x 2 mat uncles    Social History:  reports that she quit smoking about 36 years ago. Her smoking use included Cigarettes. She has a 20.00 pack-year smoking history. She has never used smokeless tobacco. She reports that she drinks alcohol. She reports that she does not use drugs.  She has a 57 year old healthy daughter.  She lives in [place]. The patient is alone today.  Allergies:  Allergies  Allergen Reactions  . Ace Inhibitors Swelling  . Pregabalin     weight gain and blurred vision  . Venlafaxine     foggy minded    Current Medications: Current Outpatient Prescriptions  Medication Sig Dispense Refill  . buPROPion (WELLBUTRIN XL) 300 MG 24 hr tablet Take 1 tablet (300 mg total) by mouth daily. 90 tablet 1  .  dexlansoprazole (DEXILANT) 60 MG capsule Take 1 capsule (60 mg total) by mouth daily. 90 capsule 1  . ferrous gluconate (FERGON) 324 MG tablet TAKE 1 TABLET BY MOUTH EVERY DAY WITH BREAKFAST 30 tablet 5  . Nerve Stimulator (STANDARD TENS) DEVI 1 Units by Does not apply route daily. 1 Device 0  . olmesartan-hydrochlorothiazide (BENICAR HCT) 40-25 MG tablet Take 1 tablet by mouth daily. 90 tablet 1  . PARoxetine (PAXIL) 40 MG tablet Take 1 tablet (40 mg total) by mouth every morning. 90 tablet 1  . ALPRAZolam (XANAX) 0.5 MG tablet TAKE 1 TABLET BY MOUTH TWICE A DAY AS NEEDED FOR ANXIETY 30 tablet 0  . docusate sodium (COLACE) 100 MG capsule Take 100 mg by mouth 2 (two) times daily.    Marland Kitchen EPIPEN 2-PAK 0.3 MG/0.3ML SOAJ injection Reported  on 04/28/2016  1  . gabapentin (NEURONTIN) 400 MG capsule TAKE 1 CAPSULE IN THE MORNING AND TAKE 2 CAPSULES IN THE PM. 90 capsule 0  . HYDROcodone-acetaminophen (NORCO) 10-325 MG tablet Take 1 tablet by mouth 3 (three) times daily as needed. If patient does not respond to Tramadol To last 3 months 45 tablet 0  . Multiple Vitamins-Minerals (CENTRUM SILVER ADULT 50+ PO) Take by mouth.    . ondansetron (ZOFRAN) 4 MG tablet TAKE 1 TABLET EVERY 8 HOURS AS NEEDED FOR NAUSEA AND VOMITING 20 tablet 0   No current facility-administered medications for this visit.     Review of Systems:  GENERAL:  Feels "ok".  No fevers or sweats.  Weight stable. PERFORMANCE STATUS (ECOG):  1 HEENT:  No visual changes, runny nose, sore throat, mouth sores or tenderness. Lungs: No shortness of breath or cough.  No hemoptysis.  On CPAP. Cardiac:  No chest pain, palpitations, orthopnea, or PND. GI:  Mild nausea and abdominal cramping today (see HPI).  No vomiting, constipation, melena or hematochezia. GU:  No urgency, frequency, dysuria, or hematuria. Musculoskeletal:  Fibromyalgia on Neurontin.  No back pain.  No joint pain.  No muscle tenderness. Extremities:  No pain or swelling. Skin:  Skin lesions pop up and scar. No rashes or skin changes. Neuro:  Poor memory.  No headache, numbness or weakness, balance or coordination issues. Endocrine:  No diabetes, thyroid issues, hot flashes or night sweats. Psych:  No mood changes, depression or anxiety. Pain:  No focal pain. Review of systems:  All other systems reviewed and found to be negative.  Physical Exam: Blood pressure 122/76, pulse 68, temperature (!) 97.1 F (36.2 C), temperature source Tympanic, resp. rate 18, weight 196 lb 11.2 oz (89.2 kg). GENERAL:  Well developed, well nourished, woman sitting comfortably in the exam room in no acute distress. MENTAL STATUS:  Alert and oriented to person, place and time. HEAD:  Brown hair.  Normocephalic, atraumatic, face  symmetric, no Cushingoid features. EYES:  Blue eyes.  Pupils equal round and reactive to light and accomodation.  No conjunctivitis or scleral icterus. ENT:  Oropharynx clear without lesion.  Tongue normal. Mucous membranes moist.  RESPIRATORY:  Clear to auscultation without rales, wheezes or rhonchi. CARDIOVASCULAR:  Regular rate and rhythm without murmur, rub or gallop. BREAST:  Right breast without masses, skin changes or nipple discharge.  Left breast s/p lumpectomy with mild radiation changes and edema inferiorly.  No masses, skin changes or nipple discharge.  ABDOMEN:  Soft, non-tender, with active bowel sounds, and no hepatosplenomegaly.  No guarding or rebound tenderness.  No masses. SKIN:  Small hypopigmented spots on upper  extremities.  No rashes, ulcers or lesions. EXTREMITIES: No edema, no skin discoloration or tenderness.  No palpable cords. LYMPH NODES: No palpable cervical, supraclavicular, axillary or inguinal adenopathy  NEUROLOGICAL: Unremarkable. PSYCH:  Appropriate.   Appointment on 09/21/2017  Component Date Value Ref Range Status  . CA 27.29 09/21/2017 21.1  0.0 - 38.6 U/mL Final   Comment: (NOTE) [facility]/ACS methodology Performed At: [facility] [place], [place] [id number] Lindon Romp MD OI:[id number]   . WBC 09/21/2017 8.2  3.6 - 11.0 K/uL Final  . RBC 09/21/2017 3.82  3.80 - 5.20 MIL/uL Final  . Hemoglobin 09/21/2017 11.8* 12.0 - 16.0 g/dL Final  . HCT 09/21/2017 34.9* 35.0 - 47.0 % Final  . MCV 09/21/2017 91.4  80.0 - 100.0 fL Final  . MCH 09/21/2017 30.8  26.0 - 34.0 pg Final  . MCHC 09/21/2017 33.6  32.0 - 36.0 g/dL Final  . RDW 09/21/2017 13.9  11.5 - 14.5 % Final  . Platelets 09/21/2017 211  150 - 440 K/uL Final  . Neutrophils Relative % 09/21/2017 79  % Final  . Neutro Abs 09/21/2017 6.4  1.4 - 6.5 K/uL Final  . Lymphocytes Relative 09/21/2017 15  % Final  . Lymphs Abs 09/21/2017 1.3  1.0 - 3.6 K/uL Final  .  Monocytes Relative 09/21/2017 4  % Final  . Monocytes Absolute 09/21/2017 0.3  0.2 - 0.9 K/uL Final  . Eosinophils Relative 09/21/2017 1  % Final  . Eosinophils Absolute 09/21/2017 0.1  0 - 0.7 K/uL Final  . Basophils Relative 09/21/2017 1  % Final  . Basophils Absolute 09/21/2017 0.1  0 - 0.1 K/uL Final  . Sodium 09/21/2017 137  135 - 145 mmol/L Final  . Potassium 09/21/2017 3.9  3.5 - 5.1 mmol/L Final  . Chloride 09/21/2017 100* 101 - 111 mmol/L Final  . CO2 09/21/2017 28  22 - 32 mmol/L Final  . Glucose, Bld 09/21/2017 102* 65 - 99 mg/dL Final  . BUN 09/21/2017 19  6 - 20 mg/dL Final  . Creatinine, Ser 09/21/2017 1.03* 0.44 - 1.00 mg/dL Final  . Calcium 09/21/2017 9.1  8.9 - 10.3 mg/dL Final  . Total Protein 09/21/2017 6.8  6.5 - 8.1 g/dL Final  . Albumin 09/21/2017 3.8  3.5 - 5.0 g/dL Final  . AST 09/21/2017 27  15 - 41 U/L Final  . ALT 09/21/2017 25  14 - 54 U/L Final  . Alkaline Phosphatase 09/21/2017 108  38 - 126 U/L Final  . Total Bilirubin 09/21/2017 0.7  0.3 - 1.2 mg/dL Final  . GFR calc non Af Amer 09/21/2017 60* >60 mL/min Final  . GFR calc Af Amer 09/21/2017 >60  >60 mL/min Final   Comment: (NOTE) The eGFR has been calculated using the CKD EPI equation. This calculation has not been validated in all clinical situations. eGFR's persistently <60 mL/min signify possible Chronic Kidney Disease.   . Anion gap 09/21/2017 9  5 - 15 Final  . Ferritin 09/21/2017 34  11 - 307 ng/mL Final    Assessment:  Deanna Baker is a 56 y.o. female with stage IA triple negative left breast cancer s/p lumpectomy and sentinel lymph node biopsy on 12/06/2012.  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  Tumor was ER, PR and HER-2/neu negative. Sentinel left nodes were negative.  Pathologic stage was [id number].    BRCA mutation was negative in 11/2012.  She has a family  history of breast cancer (mother at age 61 and maternal aunt).  She received  Adriamycin and Cytoxan (AC) 4 from 12/17/2011 - 02/17/2013. She received weekly Taxol 12 from 03/10/2013 - 06/02/2013.  She received radiation.    CA27.29 has been monitored:  33.9 on 06/02/2013, 26.1 on 02/14/2014, 23.8 on 08/22/2014, 25.1 on 09/18/2016, and 23.4 on 03/19/2017.  Bilateral diagnostic mammogram on 12/14/2015 revealed expected density and architectural distortion with dystrophic calcifications in the upper outer quadrant of the left breast stable compared to prior studies.  There was left breast skin thickening, stable with post radiation change. There are no suspicious masses, malignant calcifications, sites of nonsurgical architectural distortions or assymmetries in either breast.  Bilateral diagnostic mammogram on 12/17/2016 revealed no evidence of malignancy.  She has a history of iron deficiency anemia.  She is on ferrous sulfate 325 mg a day.  Diet is good.  She denies any melena or hematochezia.  Hematocrit has improved from 30.4 on 03/18/2016 to 35.6 on 09/18/2016.   Ferritin was 39 on 09/18/2016.  Colonoscopy on 04/28/2016 revealed a 3 mm and 7 mm polyp in the transverse colon.  There was a 2 mm polyp in the rectum.  Repeat colonoscopy was recommended in 5 years for surveillance.  EGD on 10/23/2015 revealed benign-appearing esophageal stenosis which was dilated.  She had LA Grade B reflux esophagitis.  Stomach and duodenum was normal.  She was seen by Dr. Lucilla Lame on 08/26/2016 for reflux.  She was started on Dexilant 60 mg in the evening.  She has fibromyalgia and is on gabapentin 400 mg twice a day.  Symptomatically, she denies any breast concerns.  Exam is stable.  Hematocrit is 34.9.  Ferritin is 34.  Plan: 1.  Labs today:  CBC with diff, CMP, CA27.29. 2.  Schedule bilateral mammogram on 12/17/2017. 3.  RTC in 6 months for MD assessment, labs (CBC with diff, CMP, CA27.29), and review of mammogram.   Lequita Asal, MD  09/20/2017, 1:35 PM

## 2017-09-21 ENCOUNTER — Inpatient Hospital Stay: Payer: BLUE CROSS/BLUE SHIELD | Attending: Hematology and Oncology

## 2017-09-21 ENCOUNTER — Inpatient Hospital Stay (HOSPITAL_BASED_OUTPATIENT_CLINIC_OR_DEPARTMENT_OTHER): Payer: BLUE CROSS/BLUE SHIELD | Admitting: Hematology and Oncology

## 2017-09-21 ENCOUNTER — Other Ambulatory Visit: Payer: Self-pay | Admitting: *Deleted

## 2017-09-21 ENCOUNTER — Encounter: Payer: Self-pay | Admitting: Hematology and Oncology

## 2017-09-21 VITALS — BP 122/76 | HR 68 | Temp 97.1°F | Resp 18 | Wt 196.7 lb

## 2017-09-21 DIAGNOSIS — Z9989 Dependence on other enabling machines and devices: Secondary | ICD-10-CM

## 2017-09-21 DIAGNOSIS — Z803 Family history of malignant neoplasm of breast: Secondary | ICD-10-CM

## 2017-09-21 DIAGNOSIS — I341 Nonrheumatic mitral (valve) prolapse: Secondary | ICD-10-CM

## 2017-09-21 DIAGNOSIS — D509 Iron deficiency anemia, unspecified: Secondary | ICD-10-CM | POA: Diagnosis not present

## 2017-09-21 DIAGNOSIS — Z171 Estrogen receptor negative status [ER-]: Secondary | ICD-10-CM

## 2017-09-21 DIAGNOSIS — C50412 Malignant neoplasm of upper-outer quadrant of left female breast: Secondary | ICD-10-CM

## 2017-09-21 DIAGNOSIS — Z86010 Personal history of colonic polyps: Secondary | ICD-10-CM | POA: Insufficient documentation

## 2017-09-21 DIAGNOSIS — I10 Essential (primary) hypertension: Secondary | ICD-10-CM | POA: Diagnosis not present

## 2017-09-21 DIAGNOSIS — R109 Unspecified abdominal pain: Secondary | ICD-10-CM | POA: Insufficient documentation

## 2017-09-21 DIAGNOSIS — Z87891 Personal history of nicotine dependence: Secondary | ICD-10-CM

## 2017-09-21 DIAGNOSIS — G4733 Obstructive sleep apnea (adult) (pediatric): Secondary | ICD-10-CM

## 2017-09-21 DIAGNOSIS — M797 Fibromyalgia: Secondary | ICD-10-CM | POA: Diagnosis not present

## 2017-09-21 DIAGNOSIS — Z853 Personal history of malignant neoplasm of breast: Secondary | ICD-10-CM

## 2017-09-21 DIAGNOSIS — D500 Iron deficiency anemia secondary to blood loss (chronic): Secondary | ICD-10-CM

## 2017-09-21 DIAGNOSIS — Z79899 Other long term (current) drug therapy: Secondary | ICD-10-CM | POA: Diagnosis not present

## 2017-09-21 DIAGNOSIS — R110 Nausea: Secondary | ICD-10-CM | POA: Insufficient documentation

## 2017-09-21 DIAGNOSIS — R635 Abnormal weight gain: Secondary | ICD-10-CM

## 2017-09-21 DIAGNOSIS — R5382 Chronic fatigue, unspecified: Secondary | ICD-10-CM

## 2017-09-21 LAB — CBC WITH DIFFERENTIAL/PLATELET
Basophils Absolute: 0.1 10*3/uL (ref 0–0.1)
Basophils Relative: 1 %
Eosinophils Absolute: 0.1 10*3/uL (ref 0–0.7)
Eosinophils Relative: 1 %
HCT: 34.9 % — ABNORMAL LOW (ref 35.0–47.0)
Hemoglobin: 11.8 g/dL — ABNORMAL LOW (ref 12.0–16.0)
Lymphocytes Relative: 15 %
Lymphs Abs: 1.3 10*3/uL (ref 1.0–3.6)
MCH: 30.8 pg (ref 26.0–34.0)
MCHC: 33.6 g/dL (ref 32.0–36.0)
MCV: 91.4 fL (ref 80.0–100.0)
Monocytes Absolute: 0.3 10*3/uL (ref 0.2–0.9)
Monocytes Relative: 4 %
Neutro Abs: 6.4 10*3/uL (ref 1.4–6.5)
Neutrophils Relative %: 79 %
Platelets: 211 10*3/uL (ref 150–440)
RBC: 3.82 MIL/uL (ref 3.80–5.20)
RDW: 13.9 % (ref 11.5–14.5)
WBC: 8.2 10*3/uL (ref 3.6–11.0)

## 2017-09-21 LAB — COMPREHENSIVE METABOLIC PANEL
ALT: 25 U/L (ref 14–54)
AST: 27 U/L (ref 15–41)
Albumin: 3.8 g/dL (ref 3.5–5.0)
Alkaline Phosphatase: 108 U/L (ref 38–126)
Anion gap: 9 (ref 5–15)
BUN: 19 mg/dL (ref 6–20)
CO2: 28 mmol/L (ref 22–32)
Calcium: 9.1 mg/dL (ref 8.9–10.3)
Chloride: 100 mmol/L — ABNORMAL LOW (ref 101–111)
Creatinine, Ser: 1.03 mg/dL — ABNORMAL HIGH (ref 0.44–1.00)
GFR calc Af Amer: >60 mL/min (ref >60)
GFR calc non Af Amer: 60 mL/min — ABNORMAL LOW (ref >60)
Glucose, Bld: 102 mg/dL — ABNORMAL HIGH (ref 65–99)
Potassium: 3.9 mmol/L (ref 3.5–5.1)
Sodium: 137 mmol/L (ref 135–145)
Total Bilirubin: 0.7 mg/dL (ref 0.3–1.2)
Total Protein: 6.8 g/dL (ref 6.5–8.1)

## 2017-09-21 LAB — FERRITIN: Ferritin: 34 ng/mL (ref 11–307)

## 2017-09-21 NOTE — Progress Notes (Signed)
Here for follow up. Woke this am w bowel cramping and had 2 bm -not loose. Stated still abd pain now.

## 2017-09-22 LAB — CANCER ANTIGEN 27.29: CA 27.29: 21.1 U/mL (ref 0.0–38.6)

## 2017-09-25 ENCOUNTER — Encounter: Payer: Self-pay | Admitting: Family Medicine

## 2017-09-25 ENCOUNTER — Ambulatory Visit (INDEPENDENT_AMBULATORY_CARE_PROVIDER_SITE_OTHER): Payer: BLUE CROSS/BLUE SHIELD | Admitting: Family Medicine

## 2017-09-25 ENCOUNTER — Other Ambulatory Visit: Payer: Self-pay | Admitting: Family Medicine

## 2017-09-25 VITALS — BP 100/66 | HR 78 | Temp 97.9°F | Resp 14 | Ht 60.5 in | Wt 192.5 lb

## 2017-09-25 DIAGNOSIS — Z01419 Encounter for gynecological examination (general) (routine) without abnormal findings: Secondary | ICD-10-CM

## 2017-09-25 DIAGNOSIS — R87610 Atypical squamous cells of undetermined significance on cytologic smear of cervix (ASC-US): Secondary | ICD-10-CM

## 2017-09-25 DIAGNOSIS — Z124 Encounter for screening for malignant neoplasm of cervix: Secondary | ICD-10-CM | POA: Diagnosis not present

## 2017-09-25 DIAGNOSIS — M797 Fibromyalgia: Secondary | ICD-10-CM

## 2017-09-25 NOTE — Patient Instructions (Signed)
Preventive Care 40-64 Years, Female Preventive care refers to lifestyle choices and visits with your health care provider that can promote health and wellness. What does preventive care include?  A yearly physical exam. This is also called an annual well check.  Dental exams once or twice a year.  Routine eye exams. Ask your health care provider how often you should have your eyes checked.  Personal lifestyle choices, including: ? Daily care of your teeth and gums. ? Regular physical activity. ? Eating a healthy diet. ? Avoiding tobacco and drug use. ? Limiting alcohol use. ? Practicing safe sex. ? Taking low-dose aspirin daily starting at age 57. ? Taking vitamin and mineral supplements as recommended by your health care provider. What happens during an annual well check? The services and screenings done by your health care provider during your annual well check will depend on your age, overall health, lifestyle risk factors, and family history of disease. Counseling Your health care provider may ask you questions about your:  Alcohol use.  Tobacco use.  Drug use.  Emotional well-being.  Home and relationship well-being.  Sexual activity.  Eating habits.  Work and work Statistician.  Method of birth control.  Menstrual cycle.  Pregnancy history.  Screening You may have the following tests or measurements:  Height, weight, and BMI.  Blood pressure.  Lipid and cholesterol levels. These may be checked every 5 years, or more frequently if you are over 57 years old.  Skin check.  Lung cancer screening. You may have this screening every year starting at age 57 if you have a 30-pack-year history of smoking and currently smoke or have quit within the past 15 years.  Fecal occult blood test (FOBT) of the stool. You may have this test every year starting at age 57.  Flexible sigmoidoscopy or colonoscopy. You may have a sigmoidoscopy every 5 years or a colonoscopy  every 10 years starting at age 30.  Hepatitis C blood test.  Hepatitis B blood test.  Sexually transmitted disease (STD) testing.  Diabetes screening. This is done by checking your blood sugar (glucose) after you have not eaten for a while (fasting). You may have this done every 1-3 years.  Mammogram. This may be done every 1-2 years. Talk to your health care provider about when you should start having regular mammograms. This may depend on whether you have a family history of breast cancer.  BRCA-related cancer screening. This may be done if you have a family history of breast, ovarian, tubal, or peritoneal cancers.  Pelvic exam and Pap test. This may be done every 3 years starting at age 57. Starting at age 57, this may be done every 5 years if you have a Pap test in combination with an HPV test.  Bone density scan. This is done to screen for osteoporosis. You may have this scan if you are at high risk for osteoporosis.  Discuss your test results, treatment options, and if necessary, the need for more tests with your health care provider. Vaccines Your health care provider may recommend certain vaccines, such as:  Influenza vaccine. This is recommended every year.  Tetanus, diphtheria, and acellular pertussis (Tdap, Td) vaccine. You may need a Td booster every 10 years.  Varicella vaccine. You may need this if you have not been vaccinated.  Zoster vaccine. You may need this after age 57.  Measles, mumps, and rubella (MMR) vaccine. You may need at least one dose of MMR if you were born in  1958 or later. You may also need a second dose.  Pneumococcal 13-valent conjugate (PCV13) vaccine. You may need this if you have certain conditions and were not previously vaccinated.  Pneumococcal polysaccharide (PPSV23) vaccine. You may need one or two doses if you smoke cigarettes or if you have certain conditions.  Meningococcal vaccine. You may need this if you have certain  conditions.  Hepatitis A vaccine. You may need this if you have certain conditions or if you travel or work in places where you may be exposed to hepatitis A.  Hepatitis B vaccine. You may need this if you have certain conditions or if you travel or work in places where you may be exposed to hepatitis B.  Haemophilus influenzae type b (Hib) vaccine. You may need this if you have certain conditions.  Talk to your health care provider about which screenings and vaccines you need and how often you need them. This information is not intended to replace advice given to you by your health care provider. Make sure you discuss any questions you have with your health care provider. Document Released: 12/07/2015 Document Revised: 07/30/2016 Document Reviewed: 09/11/2015 Elsevier Interactive Patient Education  2017 [employer].

## 2017-09-25 NOTE — Progress Notes (Signed)
Name: Deanna Baker   MRN: [id number]    DOB: December 11, 1960   Date:09/25/2017       Progress Note  Subjective  Chief Complaint  Chief Complaint  Patient presents with  . Annual Exam    HPI   Patient presents for annual CPE   She is married, sexually active .  Diet: tries to eat healthy, avoiding fast food, eats fruits and vegetables.   Current Exercise Habits: Home exercise routine, Type of exercise: walking, Time (Minutes): 20, Frequency (Times/Week): 2, Weekly Exercise (Minutes/Week): 40, Intensity: Mild Exercise limited by: None identified     Depression:  Depression screen [facility] 2/9 09/25/2017 07/03/2017 01/02/2017 10/02/2016 07/11/2016  Decreased Interest 0 0 0 0 0  Down, Depressed, Hopeless 0 0 1 0 0  PHQ - 2 Score 0 0 1 0 0  Altered sleeping - - - - -  Tired, decreased energy - - - - -  Change in appetite - - - - -  Feeling bad or failure about yourself  - - - - -  Trouble concentrating - - - - -  Moving slowly or fidgety/restless - - - - -  Suicidal thoughts - - - - -  PHQ-9 Score - - - - -  Difficult doing work/chores - - - - -   Hypertension: BP Readings from Last 3 Encounters:  09/25/17 100/66  09/21/17 122/76  07/03/17 122/67   Obesity: Wt Readings from Last 3 Encounters:  09/25/17 192 lb 8 oz (87.3 kg)  09/21/17 196 lb 11.2 oz (89.2 kg)  07/03/17 194 lb 14.4 oz (88.4 kg)   BMI Readings from Last 3 Encounters:  09/25/17 36.98 kg/m  09/21/17 37.17 kg/m  07/03/17 36.83 kg/m    Alcohol: no Tobacco use: smoked from 56 yo to 57 yo, quit since HIV, hep B, hep C: up to date STD testing and prevention (chl/gon/syphilis): N/A Intimate partner violence: decline Sexual History/Pain during Intercourse: post-menopausal, denies vaginal bleeding Incontinence Symptoms: she has occasional stress incontinence, but not much urge incontinence     Advanced Care Planning:  Patient does have a living will at present time. If patient does have living will, I have  requested they bring this to the clinic to be scanned in to their chart.  Breast cancer:  Last one 11/2015, new one ordered by Dr. Mike Gip BRCA gene screening: history of breast cancer, sees Dr. Mike Gip Cervical cancer screening: ASCUS in 2017 repeat today    Fall prevention/vitamin D: discussed vitamin D rich diet Lipids:  Lab Results  Component Value Date   CHOL 237 (H) 07/03/2017   CHOL 212 (H) 07/09/2016   CHOL 252 (H) 05/02/2015   Lab Results  Component Value Date   HDL 46 (L) 07/03/2017   HDL 59 07/09/2016   HDL 55 05/02/2015   Lab Results  Component Value Date   LDLCALC 153 (H) 07/03/2017   LDLCALC 131 (H) 07/09/2016   LDLCALC 155 (H) 05/02/2015   Lab Results  Component Value Date   TRIG 190 (H) 07/03/2017   TRIG 109 07/09/2016   TRIG 209 (H) 05/02/2015   Lab Results  Component Value Date   CHOLHDL 5.2 (H) 07/03/2017   CHOLHDL 3.6 07/09/2016   CHOLHDL 4.6 (H) 05/02/2015   No results found for: LDLDIRECT  Glucose:  Glucose  Date Value Ref Range Status  02/20/2015 113 (H) mg/dL Final    Comment:    65-99 NOTE: New Reference Range  01/30/15   08/22/2014 94 65 -  99 mg/dL Final  02/14/2014 117 (H) 65 - 99 mg/dL Final   Glucose, Bld  Date Value Ref Range Status  09/21/2017 102 (H) 65 - 99 mg/dL Final  03/19/2017 135 (H) 65 - 99 mg/dL Final  09/18/2016 119 (H) 65 - 99 mg/dL Final   Glucose-Capillary  Date Value Ref Range Status  08/16/2015 124 (H) 65 - 99 mg/dL Final     Colorectal cancer: 56 years old. Lung cancer:  Low Dose CT Chest recommended if Age 106-80 years, 30 pack-year currently smoking OR have quit w/in 15years. Patient does not qualify.  Quit at age 37 yo Aspirin:  Not taking it, should start because of dyslipidemia ECG:11/30/2012   Patient Active Problem List   Diagnosis Date Noted  . Cognitive decline 01/02/2017  . Chronic kidney disease, stage 3 ([place]) 09/23/2016  . Sleep apnea 07/11/2016  . ASCUS favor benign 07/10/2016  .  Memory loss 05/12/2016  . Smell or taste sensation disturbance 05/12/2016  . Personal history of colonic polyps   . Benign neoplasm of transverse colon   . Rectal polyp   . Tenosynovitis of thumb 03/12/2016  . Bilateral knee pain 03/12/2016  . Reflux esophagitis   . Stricture and stenosis of esophagus   . Abnormal findings-gastrointestinal tract   . Thickening of esophagus 08/03/2015  . Choking 07/04/2015  . Benign essential HTN 05/01/2015  . CFIDS (chronic fatigue and immune dysfunction syndrome) ([place]) 05/01/2015  . Major depression, recurrent, chronic ([place]) 05/01/2015  . Dyslipidemia 05/01/2015  . Fibromyalgia syndrome 05/01/2015  . Blood glucose elevated 05/01/2015  . Eczema intertrigo 05/01/2015  . Iron deficiency anemia due to chronic blood loss 05/01/2015  . Migraine without aura and without status migrainosus, not intractable 05/01/2015  . Excessive urination at night 05/01/2015  . Dysmetabolic syndrome 70/48/8891  . History of colonoscopy with polypectomy 05/01/2015  . Central sleep apnea 05/01/2015  . History of back surgery 05/01/2015  . Thickened nails 05/01/2015  . Malignant neoplasm of upper-outer quadrant of female breast, triple negative 06/13/2013    Past Surgical History:  Procedure Laterality Date  . BACK SURGERY    . BREAST BIOPSY Left 10/2012   +  . 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 BREAST LUMPECTOMY Left 12/06/12    lumpectomy with SN biopsy and power port placement...  . COLONOSCOPY  2013   Dr. Suzette Battiest   . COLONOSCOPY WITH PROPOFOL N/A 04/28/2016   Procedure: COLONOSCOPY WITH PROPOFOL;  Surgeon: Lucilla Lame, MD;  Location: [facility];  Service: Endoscopy;  Laterality: N/A;  CPAP  . ESOPHAGOGASTRODUODENOSCOPY (EGD) WITH PROPOFOL N/A 10/23/2015   Procedure: ESOPHAGOGASTRODUODENOSCOPY (EGD) WITH PROPOFOL;  Surgeon: Lucilla Lame, MD;  Location: [facility] ENDOSCOPY;  Service: Endoscopy;  Laterality: N/A;  . POLYPECTOMY   04/28/2016   Procedure: POLYPECTOMY;  Surgeon: Lucilla Lame, MD;  Location: [facility];  Service: Endoscopy;;  . SHOULDER SURGERY Left 2009  . TONSILLECTOMY  1965  . TUBAL LIGATION  1982  . UPPER GI ENDOSCOPY     Dr. Suzette Battiest    Family History  Problem Relation Age of Onset  . Breast cancer Mother 53  . Breast cancer Maternal Aunt   . Brain cancer Paternal Uncle        x 2 mat uncles    Social History   Social History  . Marital status: Married    Spouse name: N/A  . Number of children: N/A  . Years of education: N/A  Occupational History  . Not on file.   Social History Main Topics  . Smoking status: Former Smoker    Packs/day: 1.00    Years: 20.00    Types: Cigarettes    Quit date: 11/24/1980  . Smokeless tobacco: Never Used  . Alcohol use 0.0 oz/week     Comment: rarely, 1-2x/mo  . Drug use: No  . Sexual activity: Yes    Partners: Male   Other Topics Concern  . Not on file   Social History Narrative  . No narrative on file     Current Outpatient Prescriptions:  .  ALPRAZolam (XANAX) 0.5 MG tablet, TAKE 1 TABLET BY MOUTH TWICE A DAY AS NEEDED FOR ANXIETY, Disp: 30 tablet, Rfl: 0 .  buPROPion (WELLBUTRIN XL) 300 MG 24 hr tablet, Take 1 tablet (300 mg total) by mouth daily., Disp: 90 tablet, Rfl: 1 .  dexlansoprazole (DEXILANT) 60 MG capsule, Take 1 capsule (60 mg total) by mouth daily., Disp: 90 capsule, Rfl: 1 .  docusate sodium (COLACE) 100 MG capsule, Take 100 mg by mouth 2 (two) times daily., Disp: , Rfl:  .  ferrous gluconate (FERGON) 324 MG tablet, TAKE 1 TABLET BY MOUTH EVERY DAY WITH BREAKFAST, Disp: 30 tablet, Rfl: 5 .  gabapentin (NEURONTIN) 400 MG capsule, TAKE 1 CAPSULE IN THE MORNING AND TAKE 2 CAPSULES IN THE PM., Disp: 90 capsule, Rfl: 0 .  HYDROcodone-acetaminophen (NORCO) 10-325 MG tablet, Take 1 tablet by mouth 3 (three) times daily as needed. If patient does not respond to Tramadol To last 3 months, Disp: 45 tablet, Rfl: 0 .  Nerve  Stimulator (STANDARD TENS) DEVI, 1 Units by Does not apply route daily., Disp: 1 Device, Rfl: 0 .  olmesartan-hydrochlorothiazide (BENICAR HCT) 40-25 MG tablet, Take 1 tablet by mouth daily., Disp: 90 tablet, Rfl: 1 .  ondansetron (ZOFRAN) 4 MG tablet, TAKE 1 TABLET EVERY 8 HOURS AS NEEDED FOR NAUSEA AND VOMITING, Disp: 20 tablet, Rfl: 0 .  PARoxetine (PAXIL) 40 MG tablet, Take 1 tablet (40 mg total) by mouth every morning., Disp: 90 tablet, Rfl: 1 .  EPIPEN 2-PAK 0.3 MG/0.3ML SOAJ injection, Reported on 04/28/2016, Disp: , Rfl: 1 .  Multiple Vitamins-Minerals (CENTRUM SILVER ADULT 50+ PO), Take by mouth., Disp: , Rfl:   Allergies  Allergen Reactions  . Ace Inhibitors Swelling  . Pregabalin     weight gain and blurred vision  . Venlafaxine     foggy minded     ROS   Constitutional: Negative for fever or weight change.  Respiratory: Negative for cough and shortness of breath.   Cardiovascular: Negative for chest pain or palpitations.  Gastrointestinal: Negative for abdominal pain, no bowel changes.  Musculoskeletal: Negative for gait problem or joint swelling.  Skin: Negative for rash.  Neurological: Negative for dizziness or headache.  No other specific complaints in a complete review of systems (except as listed in HPI above).   Objective  Vitals:   09/25/17 1105  BP: 100/66  Pulse: 78  Resp: 14  Temp: 97.9 F (36.6 C)  TempSrc: Oral  SpO2: 98%  Weight: 192 lb 8 oz (87.3 kg)  Height: 5' 0.5" (1.537 m)    Body mass index is 36.98 kg/m.  Physical Exam  Constitutional: Patient appears well-developed and obese . No distress.  HENT: Head: Normocephalic and atraumatic. Ears: B TMs ok, no erythema or effusion; Nose: Nose normal. Mouth/Throat: Oropharynx is clear and moist. No oropharyngeal exudate.  Eyes: Conjunctivae and EOM are normal.  Pupils are equal, round, and reactive to light. No scleral icterus.  Neck: Normal range of motion. Neck supple. No JVD present. No  thyromegaly present.  Cardiovascular: Normal rate, regular rhythm and normal heart sounds.  No murmur heard. No BLE edema. Pulmonary/Chest: Effort normal and breath sounds normal. No respiratory distress. Abdominal: Soft. Bowel sounds are normal, no distension. There is no tenderness. no masses Breast: no lumps or masses, no nipple discharge or rashes, old scar on left breast is well healed FEMALE GENITALIA:  External genitalia normal External urethra normal Vaginal vault normal without discharge or lesions Cervix normal without discharge or lesions Bimanual exam normal without masses RECTAL: not done Musculoskeletal: Normal range of motion, no joint effusions. No gross deformities Trigger points positive Neurological: he is alert and oriented to person, place, and time. No cranial nerve deficit. Coordination, balance, strength, speech and gait are normal.  Skin: Skin is warm and dry. No rash noted. No erythema.  Psychiatric: Patient has a normal mood and affect. behavior is normal. Judgment and thought content normal.   Recent Results (from the past 2160 hour(s))  Lipid panel     Status: Abnormal   Collection Time: 07/03/17  8:55 AM  Result Value Ref Range   Cholesterol 237 (H) <200 mg/dL   Triglycerides 190 (H) <150 mg/dL   HDL 46 (L) >50 mg/dL   Total CHOL/HDL Ratio 5.2 (H) <5.0 Ratio   VLDL 38 (H) <30 mg/dL   LDL Cholesterol 153 (H) <100 mg/dL  Cancer antigen 27.29     Status: None   Collection Time: 09/21/17 11:38 AM  Result Value Ref Range   CA 27.29 21.1 0.0 - 38.6 U/mL    Comment: (NOTE) [facility]/ACS methodology Performed At: [employer] [place], [place] [id number] Lindon Romp MD WI:[id number]   CBC with Differential/Platelet     Status: Abnormal   Collection Time: 09/21/17 11:38 AM  Result Value Ref Range   WBC 8.2 3.6 - 11.0 K/uL   RBC 3.82 3.80 - 5.20 MIL/uL   Hemoglobin 11.8 (L) 12.0 - 16.0 g/dL   HCT 34.9 (L) 35.0 - 47.0 %    MCV 91.4 80.0 - 100.0 fL   MCH 30.8 26.0 - 34.0 pg   MCHC 33.6 32.0 - 36.0 g/dL   RDW 13.9 11.5 - 14.5 %   Platelets 211 150 - 440 K/uL   Neutrophils Relative % 79 %   Neutro Abs 6.4 1.4 - 6.5 K/uL   Lymphocytes Relative 15 %   Lymphs Abs 1.3 1.0 - 3.6 K/uL   Monocytes Relative 4 %   Monocytes Absolute 0.3 0.2 - 0.9 K/uL   Eosinophils Relative 1 %   Eosinophils Absolute 0.1 0 - 0.7 K/uL   Basophils Relative 1 %   Basophils Absolute 0.1 0 - 0.1 K/uL  Comprehensive metabolic panel     Status: Abnormal   Collection Time: 09/21/17 11:38 AM  Result Value Ref Range   Sodium 137 135 - 145 mmol/L   Potassium 3.9 3.5 - 5.1 mmol/L   Chloride 100 (L) 101 - 111 mmol/L   CO2 28 22 - 32 mmol/L   Glucose, Bld 102 (H) 65 - 99 mg/dL   BUN 19 6 - 20 mg/dL   Creatinine, Ser 1.03 (H) 0.44 - 1.00 mg/dL   Calcium 9.1 8.9 - 10.3 mg/dL   Total Protein 6.8 6.5 - 8.1 g/dL   Albumin 3.8 3.5 - 5.0 g/dL   AST 27 15 -  41 U/L   ALT 25 14 - 54 U/L   Alkaline Phosphatase 108 38 - 126 U/L   Total Bilirubin 0.7 0.3 - 1.2 mg/dL   GFR calc non Af Amer 60 (L) >60 mL/min   GFR calc Af Amer >60 >60 mL/min    Comment: (NOTE) The eGFR has been calculated using the CKD EPI equation. This calculation has not been validated in all clinical situations. eGFR's persistently <60 mL/min signify possible Chronic Kidney Disease.    Anion gap 9 5 - 15  Ferritin     Status: None   Collection Time: 09/21/17 11:38 AM  Result Value Ref Range   Ferritin 34 11 - 307 ng/mL      PHQ2/9: Depression screen [facility] 2/9 09/25/2017 07/03/2017 01/02/2017 10/02/2016 07/11/2016  Decreased Interest 0 0 0 0 0  Down, Depressed, Hopeless 0 0 1 0 0  PHQ - 2 Score 0 0 1 0 0  Altered sleeping - - - - -  Tired, decreased energy - - - - -  Change in appetite - - - - -  Feeling bad or failure about yourself  - - - - -  Trouble concentrating - - - - -  Moving slowly or fidgety/restless - - - - -  Suicidal thoughts - - - - -  PHQ-9 Score - - - -  -  Difficult doing work/chores - - - - -     Fall Risk: Fall Risk  09/25/2017 07/03/2017 01/02/2017 10/02/2016 07/11/2016  Falls in the past year? _0   Comment - - - - -  Number falls in past yr: - - - - -  Comment - - - - -  Injury with Fall? - - - - -     Functional Status Survey: Is the patient deaf or have difficulty hearing?: No Does the patient have difficulty seeing, even when wearing glasses/contacts?: No Does the patient have difficulty concentrating, remembering, or making decisions?: No Does the patient have difficulty walking or climbing stairs?: No Does the patient have difficulty dressing or bathing?: No Does the patient have difficulty doing errands alone such as visiting a doctor's office or shopping?: No   Assessment & Plan  1. Well woman exam  Discussed importance of 150 minutes of physical activity weekly, eat two servings of fish weekly, eat one serving of tree nuts ( cashews, pistachios, pecans, almonds.Marland Kitchen) every other day, eat 6 servings of fruit/vegetables daily and drink plenty of water and avoid sweet beverages.   Discussed recent labs and lipid panel, needs to change diet, refuses statin therapy   2. ASCUS of cervix with negative high risk HPV  We will recheck labs  3. Cervical cancer screening  - pap with HPV

## 2017-09-25 NOTE — Telephone Encounter (Signed)
Patient requesting refill of Gabapentin to [facility].

## 2017-09-29 LAB — PAP IG AND HPV HIGH-RISK: HPV DNA HIGH RISK: NOT DETECTED

## 2017-10-02 ENCOUNTER — Ambulatory Visit: Payer: BLUE CROSS/BLUE SHIELD | Admitting: Family Medicine

## 2017-10-02 ENCOUNTER — Encounter: Payer: Self-pay | Admitting: Family Medicine

## 2017-10-02 VITALS — BP 118/74 | HR 87 | Temp 98.3°F | Resp 16 | Ht 61.0 in | Wt 192.5 lb

## 2017-10-02 DIAGNOSIS — K219 Gastro-esophageal reflux disease without esophagitis: Secondary | ICD-10-CM

## 2017-10-02 DIAGNOSIS — R5382 Chronic fatigue, unspecified: Secondary | ICD-10-CM | POA: Diagnosis not present

## 2017-10-02 DIAGNOSIS — D8989 Other specified disorders involving the immune mechanism, not elsewhere classified: Secondary | ICD-10-CM | POA: Diagnosis not present

## 2017-10-02 DIAGNOSIS — E785 Hyperlipidemia, unspecified: Secondary | ICD-10-CM | POA: Diagnosis not present

## 2017-10-02 DIAGNOSIS — M797 Fibromyalgia: Secondary | ICD-10-CM | POA: Diagnosis not present

## 2017-10-02 DIAGNOSIS — F339 Major depressive disorder, recurrent, unspecified: Secondary | ICD-10-CM | POA: Diagnosis not present

## 2017-10-02 DIAGNOSIS — I10 Essential (primary) hypertension: Secondary | ICD-10-CM | POA: Diagnosis not present

## 2017-10-02 DIAGNOSIS — A279 Leptospirosis, unspecified: Secondary | ICD-10-CM | POA: Diagnosis not present

## 2017-10-02 DIAGNOSIS — G43009 Migraine without aura, not intractable, without status migrainosus: Secondary | ICD-10-CM

## 2017-10-02 DIAGNOSIS — K121 Other forms of stomatitis: Secondary | ICD-10-CM

## 2017-10-02 DIAGNOSIS — G4733 Obstructive sleep apnea (adult) (pediatric): Secondary | ICD-10-CM

## 2017-10-02 MED ORDER — FIRST-DUKES MOUTHWASH MT SUSP: 15.0000 mL | Freq: Three times a day (TID) | OROMUCOSAL | 0 refills | Status: DC

## 2017-10-02 MED ORDER — DOXYCYCLINE MONOHYDRATE 100 MG PO CAPS: 100.0000 mg | ORAL_CAPSULE | Freq: Two times a day (BID) | ORAL | 0 refills | Status: DC

## 2017-10-02 NOTE — Progress Notes (Deleted)
Name: Deanna Baker   MRN: [id number]    DOB: Oct 12, 1961   Date:10/02/2017       Progress Note  Subjective  Chief Complaint  Chief Complaint  Patient presents with  . Medication Refill    3 month F/U  . Hypertension    Denies any symptoms  . Fibromyalgia    Been better with cooler weather, heat makes her symptoms worst  . Gastroesophageal Reflux    Doing well with Dexilant  . Depression  . Sleep Apnea  . Chronic Fatigue Syndrome  . Jaw Pain    Onset-3 days ago, on left side has been putting Orajel on it    HPI  *** Patient Active Problem List   Diagnosis Date Noted  . Cognitive decline 01/02/2017  . Chronic kidney disease, stage 3 ([place]) 09/23/2016  . Sleep apnea 07/11/2016  . ASCUS favor benign 07/10/2016  . Memory loss 05/12/2016  . Smell or taste sensation disturbance 05/12/2016  . Personal history of colonic polyps   . Benign neoplasm of transverse colon   . Rectal polyp   . Tenosynovitis of thumb 03/12/2016  . Bilateral knee pain 03/12/2016  . Reflux esophagitis   . Stricture and stenosis of esophagus   . Abnormal findings-gastrointestinal tract   . Thickening of esophagus 08/03/2015  . Choking 07/04/2015  . Benign essential HTN 05/01/2015  . CFIDS (chronic fatigue and immune dysfunction syndrome) ([place]) 05/01/2015  . Major depression, recurrent, chronic ([place]) 05/01/2015  . Dyslipidemia 05/01/2015  . Fibromyalgia syndrome 05/01/2015  . Blood glucose elevated 05/01/2015  . Eczema intertrigo 05/01/2015  . Iron deficiency anemia due to chronic blood loss 05/01/2015  . Migraine without aura and without status migrainosus, not intractable 05/01/2015  . Excessive urination at night 05/01/2015  . Dysmetabolic syndrome 35/32/9924  . History of colonoscopy with polypectomy 05/01/2015  . Central sleep apnea 05/01/2015  . History of back surgery 05/01/2015  . Thickened nails 05/01/2015  . Malignant neoplasm of upper-outer quadrant of female breast, triple negative  06/13/2013    Past Surgical History:  Procedure Laterality Date  . BACK SURGERY    . BREAST BIOPSY Left 10/2012   +  . 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 BREAST LUMPECTOMY Left 12/06/12    lumpectomy with SN biopsy and power port placement...  . COLONOSCOPY  2013   Dr. Suzette Battiest   . SHOULDER SURGERY Left 2009  . TONSILLECTOMY  1965  . TUBAL LIGATION  1982  . UPPER GI ENDOSCOPY     Dr. Suzette Battiest    Family History  Problem Relation Age of Onset  . Breast cancer Mother 78  . Breast cancer Maternal Aunt   . Brain cancer Paternal Uncle        x 2 mat uncles    Social History   Socioeconomic History  . Marital status: Married    Spouse name: Not on file  . Number of children: Not on file  . Years of education: Not on file  . Highest education level: Not on file  Social Needs  . Financial resource strain: Not on file  . Food insecurity - worry: Not on file  . Food insecurity - inability: Not on file  . Transportation needs - medical: Not on file  . Transportation needs - non-medical: Not on file  Occupational History  . Not on file  Tobacco Use  . Smoking status: Former Smoker    Packs/day: 1.00  Years: 20.00    Pack years: 20.00    Types: Cigarettes    Last attempt to quit: 11/24/1980    Years since quitting: 36.8  . Smokeless tobacco: Never Used  Substance and Sexual Activity  . Alcohol use: Yes    Alcohol/week: 0.0 oz    Comment: rarely, 1-2x/mo  . Drug use: No  . Sexual activity: Yes    Partners: Male  Other Topics Concern  . Not on file  Social History Narrative  . Not on file     Current Outpatient Medications:  .  ALPRAZolam (XANAX) 0.5 MG tablet, TAKE 1 TABLET BY MOUTH TWICE A DAY AS NEEDED FOR ANXIETY, Disp: 30 tablet, Rfl: 0 .  buPROPion (WELLBUTRIN XL) 300 MG 24 hr tablet, Take 1 tablet (300 mg total) by mouth daily., Disp: 90 tablet, Rfl: 1 .  dexlansoprazole (DEXILANT) 60 MG capsule, Take 1 capsule (60 mg  total) by mouth daily., Disp: 90 capsule, Rfl: 1 .  docusate sodium (COLACE) 100 MG capsule, Take 100 mg by mouth 2 (two) times daily., Disp: , Rfl:  .  ferrous gluconate (FERGON) 324 MG tablet, TAKE 1 TABLET BY MOUTH EVERY DAY WITH BREAKFAST, Disp: 30 tablet, Rfl: 5 .  gabapentin (NEURONTIN) 400 MG capsule, TAKE 1 CAPSULE IN THE MORNING AND TAKE 2 CAPSULES IN THE PM., Disp: 90 capsule, Rfl: 0 .  HYDROcodone-acetaminophen (NORCO) 10-325 MG tablet, Take 1 tablet by mouth 3 (three) times daily as needed. If patient does not respond to Tramadol To last 3 months, Disp: 45 tablet, Rfl: 0 .  Nerve Stimulator (STANDARD TENS) DEVI, 1 Units by Does not apply route daily., Disp: 1 Device, Rfl: 0 .  olmesartan-hydrochlorothiazide (BENICAR HCT) 40-25 MG tablet, Take 1 tablet by mouth daily., Disp: 90 tablet, Rfl: 1 .  ondansetron (ZOFRAN) 4 MG tablet, TAKE 1 TABLET EVERY 8 HOURS AS NEEDED FOR NAUSEA AND VOMITING, Disp: 20 tablet, Rfl: 0 .  PARoxetine (PAXIL) 40 MG tablet, Take 1 tablet (40 mg total) by mouth every morning., Disp: 90 tablet, Rfl: 1 .  EPIPEN 2-PAK 0.3 MG/0.3ML SOAJ injection, Reported on 04/28/2016, Disp: , Rfl: 1 .  Multiple Vitamins-Minerals (CENTRUM SILVER ADULT 50+ PO), Take by mouth., Disp: , Rfl:   Allergies  Allergen Reactions  . Ace Inhibitors Swelling  . Pregabalin     weight gain and blurred vision  . Venlafaxine     foggy minded     ROS  Constitutional: Negative for fever or weight change.  Respiratory: Negative for cough and shortness of breath.   Cardiovascular: Negative for chest pain or palpitations.  Gastrointestinal: Negative for abdominal pain, no bowel changes.  Musculoskeletal: Negative for gait problem or joint swelling.  Skin: Negative for rash.  Neurological: Negative for dizziness or headache.  No other specific complaints in a complete review of systems (except as listed in HPI above).  Objective  Vitals:   10/02/17 0934  BP: 118/74  Pulse: 87  Resp:  16  Temp: 98.3 F (36.8 C)  TempSrc: Oral  SpO2: 96%  Weight: 192 lb 8 oz (87.3 kg)  Height: '5\' 1"'$  (1.549 m)    Body mass index is 36.37 kg/m.  Physical Exam  Constitutional: Patient appears well-developed and well-nourished. Obese  No distress.  HEENT: head atraumatic, normocephalic, pupils equal and reactive to light, neck supple, throat within normal limits Cardiovascular: Normal rate, regular rhythm and normal heart sounds.  No murmur heard. No BLE edema. Pulmonary/Chest: Effort normal and breath sounds  normal. No respiratory distress. Muscular Skeletal: Trigger points positive  Abdominal: Soft.  There is no tenderness. Psychiatric: Patient has a normal mood and affect. behavior is normal. Judgment and thought content normal.  Recent Results (from the past 2160 hour(s))  Cancer antigen 27.29     Status: None   Collection Time: 09/21/17 11:38 AM  Result Value Ref Range   CA 27.29 21.1 0.0 - 38.6 U/mL    Comment: (NOTE) [facility]/ACS methodology Performed At: [facility] [place], [place] [id number] Lindon Romp MD SE:[id number]   CBC with Differential/Platelet     Status: Abnormal   Collection Time: 09/21/17 11:38 AM  Result Value Ref Range   WBC 8.2 3.6 - 11.0 K/uL   RBC 3.82 3.80 - 5.20 MIL/uL   Hemoglobin 11.8 (L) 12.0 - 16.0 g/dL   HCT 34.9 (L) 35.0 - 47.0 %   MCV 91.4 80.0 - 100.0 fL   MCH 30.8 26.0 - 34.0 pg   MCHC 33.6 32.0 - 36.0 g/dL   RDW 13.9 11.5 - 14.5 %   Platelets 211 150 - 440 K/uL   Neutrophils Relative % 79 %   Neutro Abs 6.4 1.4 - 6.5 K/uL   Lymphocytes Relative 15 %   Lymphs Abs 1.3 1.0 - 3.6 K/uL   Monocytes Relative 4 %   Monocytes Absolute 0.3 0.2 - 0.9 K/uL   Eosinophils Relative 1 %   Eosinophils Absolute 0.1 0 - 0.7 K/uL   Basophils Relative 1 %   Basophils Absolute 0.1 0 - 0.1 K/uL  Comprehensive metabolic panel     Status: Abnormal   Collection Time: 09/21/17 11:38 AM  Result Value Ref Range    Sodium 137 135 - 145 mmol/L   Potassium 3.9 3.5 - 5.1 mmol/L   Chloride 100 (L) 101 - 111 mmol/L   CO2 28 22 - 32 mmol/L   Glucose, Bld 102 (H) 65 - 99 mg/dL   BUN 19 6 - 20 mg/dL   Creatinine, Ser 1.03 (H) 0.44 - 1.00 mg/dL   Calcium 9.1 8.9 - 10.3 mg/dL   Total Protein 6.8 6.5 - 8.1 g/dL   Albumin 3.8 3.5 - 5.0 g/dL   AST 27 15 - 41 U/L   ALT 25 14 - 54 U/L   Alkaline Phosphatase 108 38 - 126 U/L   Total Bilirubin 0.7 0.3 - 1.2 mg/dL   GFR calc non Af Amer 60 (L) >60 mL/min   GFR calc Af Amer >60 >60 mL/min    Comment: (NOTE) The eGFR has been calculated using the CKD EPI equation. This calculation has not been validated in all clinical situations. eGFR's persistently <60 mL/min signify possible Chronic Kidney Disease.    Anion gap 9 5 - 15  Ferritin     Status: None   Collection Time: 09/21/17 11:38 AM  Result Value Ref Range   Ferritin 34 11 - 307 ng/mL  Pap IG and HPV (high risk) DNA detection     Status: None   Collection Time: 09/25/17  2:50 PM  Result Value Ref Range   Clinical Information:      Comment: None given   LMP:      Comment: NA   PREV. PAP:      Comment: NA   PREV. BX:      Comment: NA   HPV DNA Probe-Source      Comment: Endocervix   STATEMENT OF ADEQUACY:      Comment: SATISFACTORY FOR  EVALUATION   INTERPRETATION/RESULT:      Comment: Negative for intraepithelial lesion or malignancy.   Comment:      Comment: This Pap test has been evaluated with computer assisted technology.    CYTOTECHNOLOGIST:      Comment: JLT, CT(ASCP) CT screening location: [address], [address], [place], [place] [address]    HPV DNA High Risk Not Detected Not Detect    Comment: This test was performed using the APTIMA HPV Assay ([employer] . This assay detects E6/E7 viral messenger RNA (mRNA) from 14 high-risk HPV types (16,18,31,33,35,39,45,51,52,56,58,59,66,68). . The analytical performance characteristics of this assay have been determined by  [facility]. The modifications have not been cleared or approved by the FDA. This assay has been validated pursuant to the CLIA regulations and is used for clinical purposes. EXPLANATORY NOTE:  . The Pap is a screening test for cervical cancer. It is  not a diagnostic test and is subject to false negative  and false positive results. It is most reliable when a  satisfactory sample, regularly obtained, is submitted  with relevant clinical findings and history, and when  the Pap result is evaluated along with historic and  current clinical information. .       PHQ2/9: Depression screen [facility] 2/9 09/25/2017 07/03/2017 01/02/2017 10/02/2016 07/11/2016  Decreased Interest 0 0 0 0 0  Down, Depressed, Hopeless 0 0 1 0 0  PHQ - 2 Score 0 0 1 0 0  Altered sleeping - - - - -  Tired, decreased energy - - - - -  Change in appetite - - - - -  Feeling bad or failure about yourself  - - - - -  Trouble concentrating - - - - -  Moving slowly or fidgety/restless - - - - -  Suicidal thoughts - - - - -  PHQ-9 Score - - - - -  Difficult doing work/chores - - - - -    Fall Risk: Fall Risk  09/25/2017 07/03/2017 01/02/2017 10/02/2016 07/11/2016  Falls in the past year? No No No No No  Comment - - - - -  Number falls in past yr: - - - - -  Comment - - - - -  Injury with Fall? - - - - -      Assessment & Plan  1. Benign essential HTN  At goal , continue medication   2. Fibromyalgia syndrome  Stable  3. Major depression, recurrent, chronic ([facility])  Taking medication as prescribed  4. CFIDS (chronic fatigue and immune dysfunction syndrome) ([facility])  Stable  5. Migraine without aura and without status migrainosus, not intractable  No recent episodes  6. Gastro-esophageal reflux disease without esophagitis  Stable at this time  7. Dyslipidemia  She refuses statin therapy  8. Obstructive sleep apnea syndrome  Continue CPAP   9. Leptospirosis  Her dog was diagnosed, she has not  symptoms but wants to take medication for it  Doxy 100 mg twice daily sent to pharmacy

## 2017-10-02 NOTE — Progress Notes (Deleted)
Name: Deanna Baker   MRN: [id number]    DOB: Jan 22, 1961   Date:10/02/2017       Progress Note  Subjective  Chief Complaint  Chief Complaint  Patient presents with  . Medication Refill    3 month F/U  . Hypertension    Denies any symptoms  . Fibromyalgia    Been better with cooler weather, heat makes her symptoms worst  . Gastroesophageal Reflux    Doing well with Dexilant  . Depression  . Sleep Apnea  . Chronic Fatigue Syndrome  . Jaw Pain    Onset-3 days ago, on left side has been putting Orajel on it    HPI   HTN: she has been taking medication and no chest pain, palpitation or SOB. No side effects of medication, she is on  Benicar HCTZ.   FMS: she continues to have daily pain, taking pain medications :gabapentin, and antidepressant, prn Hydrocodone ( Tramadol did not work for her) and sometimes with Zofran to control nausea. She has daily mental fogginess, trigger point pains and generalized body aches.   Average pain 4/10. She states she prefers cooler weather.   CFIDS: still feels tired all the time, but symptoms waxes and wanes in severity. Post-activity fatigue is severe. Off stimulants, but does not want to go back on medication  She needs to nap most  days - usually 2 hours.   Reflux esophagitis: choking and also esophageal thickenes on CT chest/abdomen. Seen by Dr. Durwin Reges she had an EGD and esophageal dilation, and was diagnosed with reflux esophagitis and is still on Dexilant, She states that symptoms are well controlled with medication, if she skips medication she has regurgitation at night and burning sensation  Major Depression: she is taking Paxil and Wellbutrin, she states symptoms are not in remission,she has anhedonia, and sometimes feels extra tired. She states sadness is not as intense. She still goes to church, takes care of her house and volunteers for her church.  She has been worried about her dog lately, but hopeful that she will be healed  Mouth  sore: she has noticed left cheek pain and soreness, better with Ora jel. Symptoms started 4 days ago, it does not radiate   ASCUS: discussed negative HPV and recheck in one year  Cognitive impairment: she is taking one gabapentin in am and two at night  ( she states she prefers not stopping the morning dose because it helps with pain) , she has been more forgetful, misplacing check book at home, lost her prescription glasses, offered her to see Neurologist but she wants to hold off for now.   OSA: she is wearing CPAP every night, all night. She tolerated mask well and it helps her with energy level in morning  Migraine headaches: no recent episodes   Dyslipidemia: she refuses statin therapy, last lipid panel reviewed with patient and it did not look good, discussed life style modification   Dog diagnosed with leptospirosis, getting treatment, she was advised by Vet to take medication for it, she has no symptoms at this time  Patient Active Problem List   Diagnosis Date Noted  . Cognitive decline 01/02/2017  . Chronic kidney disease, stage 3 ([place]) 09/23/2016  . Sleep apnea 07/11/2016  . ASCUS favor benign 07/10/2016  . Memory loss 05/12/2016  . Smell or taste sensation disturbance 05/12/2016  . Personal history of colonic polyps   . Benign neoplasm of transverse colon   . Rectal polyp   . Tenosynovitis  of thumb 03/12/2016  . Bilateral knee pain 03/12/2016  . Reflux esophagitis   . Stricture and stenosis of esophagus   . Abnormal findings-gastrointestinal tract   . Thickening of esophagus 08/03/2015  . Choking 07/04/2015  . Benign essential HTN 05/01/2015  . CFIDS (chronic fatigue and immune dysfunction syndrome) ([place]) 05/01/2015  . Major depression, recurrent, chronic ([place]) 05/01/2015  . Dyslipidemia 05/01/2015  . Fibromyalgia syndrome 05/01/2015  . Blood glucose elevated 05/01/2015  . Eczema intertrigo 05/01/2015  . Iron deficiency anemia due to chronic blood loss  05/01/2015  . Migraine without aura and without status migrainosus, not intractable 05/01/2015  . Excessive urination at night 05/01/2015  . Dysmetabolic syndrome 24/07/7352  . History of colonoscopy with polypectomy 05/01/2015  . Central sleep apnea 05/01/2015  . History of back surgery 05/01/2015  . Thickened nails 05/01/2015  . Malignant neoplasm of upper-outer quadrant of female breast, triple negative 06/13/2013    Past Surgical History:  Procedure Laterality Date  . BACK SURGERY    . BREAST BIOPSY Left 10/2012   +  . 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 BREAST LUMPECTOMY Left 12/06/12    lumpectomy with SN biopsy and power port placement...  . COLONOSCOPY  2013   Dr. Suzette Battiest   . SHOULDER SURGERY Left 2009  . TONSILLECTOMY  1965  . TUBAL LIGATION  1982  . UPPER GI ENDOSCOPY     Dr. Suzette Battiest    Family History  Problem Relation Age of Onset  . Breast cancer Mother 5  . Breast cancer Maternal Aunt   . Brain cancer Paternal Uncle        x 2 mat uncles    Social History   Socioeconomic History  . Marital status: Married    Spouse name: Not on file  . Number of children: Not on file  . Years of education: Not on file  . Highest education level: Not on file  Social Needs  . Financial resource strain: Not on file  . Food insecurity - worry: Not on file  . Food insecurity - inability: Not on file  . Transportation needs - medical: Not on file  . Transportation needs - non-medical: Not on file  Occupational History  . Not on file  Tobacco Use  . Smoking status: Former Smoker    Packs/day: 1.00    Years: 20.00    Pack years: 20.00    Types: Cigarettes    Last attempt to quit: 11/24/1980    Years since quitting: 36.8  . Smokeless tobacco: Never Used  Substance and Sexual Activity  . Alcohol use: Yes    Alcohol/week: 0.0 oz    Comment: rarely, 1-2x/mo  . Drug use: No  . Sexual activity: Yes    Partners: Male  Other Topics  Concern  . Not on file  Social History Narrative  . Not on file     Current Outpatient Medications:  .  ALPRAZolam (XANAX) 0.5 MG tablet, TAKE 1 TABLET BY MOUTH TWICE A DAY AS NEEDED FOR ANXIETY, Disp: 30 tablet, Rfl: 0 .  buPROPion (WELLBUTRIN XL) 300 MG 24 hr tablet, Take 1 tablet (300 mg total) by mouth daily., Disp: 90 tablet, Rfl: 1 .  dexlansoprazole (DEXILANT) 60 MG capsule, Take 1 capsule (60 mg total) by mouth daily., Disp: 90 capsule, Rfl: 1 .  docusate sodium (COLACE) 100 MG capsule, Take 100 mg by mouth 2 (two) times daily., Disp: , Rfl:  .  ferrous gluconate (FERGON) 324 MG tablet, TAKE 1 TABLET BY MOUTH EVERY DAY WITH BREAKFAST, Disp: 30 tablet, Rfl: 5 .  gabapentin (NEURONTIN) 400 MG capsule, TAKE 1 CAPSULE IN THE MORNING AND TAKE 2 CAPSULES IN THE PM., Disp: 90 capsule, Rfl: 0 .  HYDROcodone-acetaminophen (NORCO) 10-325 MG tablet, Take 1 tablet by mouth 3 (three) times daily as needed. If patient does not respond to Tramadol To last 3 months, Disp: 45 tablet, Rfl: 0 .  Nerve Stimulator (STANDARD TENS) DEVI, 1 Units by Does not apply route daily., Disp: 1 Device, Rfl: 0 .  olmesartan-hydrochlorothiazide (BENICAR HCT) 40-25 MG tablet, Take 1 tablet by mouth daily., Disp: 90 tablet, Rfl: 1 .  ondansetron (ZOFRAN) 4 MG tablet, TAKE 1 TABLET EVERY 8 HOURS AS NEEDED FOR NAUSEA AND VOMITING, Disp: 20 tablet, Rfl: 0 .  PARoxetine (PAXIL) 40 MG tablet, Take 1 tablet (40 mg total) by mouth every morning., Disp: 90 tablet, Rfl: 1 .  doxycycline (MONODOX) 100 MG capsule, Take 1 capsule (100 mg total) 2 (two) times daily by mouth., Disp: 14 capsule, Rfl: 0 .  EPIPEN 2-PAK 0.3 MG/0.3ML SOAJ injection, Reported on 04/28/2016, Disp: , Rfl: 1 .  Multiple Vitamins-Minerals (CENTRUM SILVER ADULT 50+ PO), Take by mouth., Disp: , Rfl:   Allergies  Allergen Reactions  . Ace Inhibitors Swelling  . Pregabalin     weight gain and blurred vision  . Venlafaxine     foggy minded      ROS  Constitutional: Negative for fever or weight change.  Respiratory: Negative for cough and shortness of breath.   Cardiovascular: Negative for chest pain or palpitations.  Gastrointestinal: Negative for abdominal pain, no bowel changes.  Musculoskeletal: Negative for gait problem or joint swelling.  Skin: Negative for rash.  Neurological: Negative for dizziness or headache.  No other specific complaints in a complete review of systems (except as listed in HPI above).  Objective  Vitals:   10/02/17 0934  BP: 118/74  Pulse: 87  Resp: 16  Temp: 98.3 F (36.8 C)  TempSrc: Oral  SpO2: 96%  Weight: 192 lb 8 oz (87.3 kg)  Height: _0  (1.549 m)    Body mass index is 36.37 kg/m.  Physical Exam  Constitutional: Patient appears well-developed and well-nourished. Obese  No distress.  HEENT: head atraumatic, normocephalic, pupils equal and reactive to light, neck supple, throat within normal limits, she has some erythema on left cheek.  Cardiovascular: Normal rate, regular rhythm and normal heart sounds.  No murmur heard. No BLE edema. Pulmonary/Chest: Effort normal and breath sounds normal. No respiratory distress. Muscular Skeletal: Trigger points positive  Abdominal: Soft.  There is no tenderness. Psychiatric: Patient has a normal mood and affect. behavior is normal. Judgment and thought content normal.  Recent Results (from the past 2160 hour(s))  Cancer antigen 27.29     Status: None   Collection Time: 09/21/17 11:38 AM  Result Value Ref Range   CA 27.29 21.1 0.0 - 38.6 U/mL    Comment: (NOTE) [facility]/ACS methodology Performed At: [facility] [place], [place] [id number] Lindon Romp MD YK:[id number]   CBC with Differential/Platelet     Status: Abnormal   Collection Time: 09/21/17 11:38 AM  Result Value Ref Range   WBC 8.2 3.6 - 11.0 K/uL   RBC 3.82 3.80 - 5.20 MIL/uL   Hemoglobin 11.8 (L) 12.0 - 16.0 g/dL   HCT 34.9 (L)  35.0 - 47.0 %   MCV 91.4  80.0 - 100.0 fL   MCH 30.8 26.0 - 34.0 pg   MCHC 33.6 32.0 - 36.0 g/dL   RDW 13.9 11.5 - 14.5 %   Platelets 211 150 - 440 K/uL   Neutrophils Relative % 79 %   Neutro Abs 6.4 1.4 - 6.5 K/uL   Lymphocytes Relative 15 %   Lymphs Abs 1.3 1.0 - 3.6 K/uL   Monocytes Relative 4 %   Monocytes Absolute 0.3 0.2 - 0.9 K/uL   Eosinophils Relative 1 %   Eosinophils Absolute 0.1 0 - 0.7 K/uL   Basophils Relative 1 %   Basophils Absolute 0.1 0 - 0.1 K/uL  Comprehensive metabolic panel     Status: Abnormal   Collection Time: 09/21/17 11:38 AM  Result Value Ref Range   Sodium 137 135 - 145 mmol/L   Potassium 3.9 3.5 - 5.1 mmol/L   Chloride 100 (L) 101 - 111 mmol/L   CO2 28 22 - 32 mmol/L   Glucose, Bld 102 (H) 65 - 99 mg/dL   BUN 19 6 - 20 mg/dL   Creatinine, Ser 1.03 (H) 0.44 - 1.00 mg/dL   Calcium 9.1 8.9 - 10.3 mg/dL   Total Protein 6.8 6.5 - 8.1 g/dL   Albumin 3.8 3.5 - 5.0 g/dL   AST 27 15 - 41 U/L   ALT 25 14 - 54 U/L   Alkaline Phosphatase 108 38 - 126 U/L   Total Bilirubin 0.7 0.3 - 1.2 mg/dL   GFR calc non Af Amer 60 (L) >60 mL/min   GFR calc Af Amer >60 >60 mL/min    Comment: (NOTE) The eGFR has been calculated using the CKD EPI equation. This calculation has not been validated in all clinical situations. eGFR's persistently <60 mL/min signify possible Chronic Kidney Disease.    Anion gap 9 5 - 15  Ferritin     Status: None   Collection Time: 09/21/17 11:38 AM  Result Value Ref Range   Ferritin 34 11 - 307 ng/mL  Pap IG and HPV (high risk) DNA detection     Status: None   Collection Time: 09/25/17  2:50 PM  Result Value Ref Range   Clinical Information:      Comment: None given   LMP:      Comment: NA   PREV. PAP:      Comment: NA   PREV. BX:      Comment: NA   HPV DNA Probe-Source      Comment: Endocervix   STATEMENT OF ADEQUACY:      Comment: SATISFACTORY FOR EVALUATION   INTERPRETATION/RESULT:      Comment: Negative for  intraepithelial lesion or malignancy.   Comment:      Comment: This Pap test has been evaluated with computer assisted technology.    CYTOTECHNOLOGIST:      Comment: JLT, CT(ASCP) CT screening location: [address], [place], [place] [address]    HPV DNA High Risk Not Detected Not Detect    Comment: This test was performed using the APTIMA HPV Assay ([employer] . This assay detects E6/E7 viral messenger RNA (mRNA) from 14 high-risk HPV types (16,18,31,33,35,39,45,51,52,56,58,59,66,68). . The analytical performance characteristics of this assay have been determined by [facility]. The modifications have not been cleared or approved by the FDA. This assay has been validated pursuant to the CLIA regulations and is used for clinical purposes. EXPLANATORY NOTE:  . The Pap is a screening test for cervical cancer. It is  not  a diagnostic test and is subject to false negative  and false positive results. It is most reliable when a  satisfactory sample, regularly obtained, is submitted  with relevant clinical findings and history, and when  the Pap result is evaluated along with historic and  current clinical information. .       PHQ2/9: Depression screen [facility] [place] 2/9 09/25/2017 07/03/2017 01/02/2017 10/02/2016 07/11/2016  Decreased Interest 0 0 0 0 0  Down, Depressed, Hopeless 0 0 1 0 0  PHQ - 2 Score 0 0 1 0 0  Altered sleeping - - - - -  Tired, decreased energy - - - - -  Change in appetite - - - - -  Feeling bad or failure about yourself  - - - - -  Trouble concentrating - - - - -  Moving slowly or fidgety/restless - - - - -  Suicidal thoughts - - - - -  PHQ-9 Score - - - - -  Difficult doing work/chores - - - - -    Fall Risk: Fall Risk  09/25/2017 07/03/2017 01/02/2017 10/02/2016 07/11/2016  Falls in the past year? _0   Comment - - - - -  Number falls in past yr: - - - - -  Comment - - - - -  Injury with Fall? - - - - -      Assessment &  Plan  1. Benign essential HTN  At goal , continue medication   2. Fibromyalgia syndrome  Stable  3. Major depression, recurrent, chronic ([facility])  Taking medication as prescribed  4. CFIDS (chronic fatigue and immune dysfunction syndrome) ([facility])  Stable  5. Migraine without aura and without status migrainosus, not intractable  No recent episodes  6. Gastro-esophageal reflux disease without esophagitis  Stable at this time  7. Dyslipidemia  She refuses statin therapy  8. Obstructive sleep apnea syndrome  Continue CPAP   9. Leptospirosis  Her dog was diagnosed, she has not symptoms but wants to take medication for it  Doxy 100 mg twice daily sent to pharmacy   10. Oral ulceration  - Diphenhyd-Hydrocort-Nystatin (FIRST-DUKES MOUTHWASH) SUSP; Use as directed 15 mLs 4 (four) times daily -  before meals and at bedtime in the mouth or throat.  Dispense: 237 mL; Refill: 0

## 2017-10-02 NOTE — Progress Notes (Signed)
Name: Deanna Baker   MRN: [id number]    DOB: 1961-07-31   Date:10/02/2017       Progress Note  Subjective  Chief Complaint  Chief Complaint  Patient presents with  . Medication Refill    3 month F/U  . Hypertension    Denies any symptoms  . Fibromyalgia    Been better with cooler weather, heat makes her symptoms worst  . Gastroesophageal Reflux    Doing well with Dexilant  . Depression  . Sleep Apnea  . Chronic Fatigue Syndrome  . Jaw Pain    Onset-3 days ago, on left side has been putting Orajel on it    HPI  HTN: she has been taking medication and no chest pain, palpitation or SOB. No side effects of medication, she is on  Benicar HCTZ.   FMS: she continues to have daily pain, taking pain medications :gabapentin, and antidepressant, prn Hydrocodone ( Tramadol did not work for her)and sometimes with Zofran to control nausea. She has daily mental fogginess, trigger point painsand generalized body aches. Average pain 4/10. She states she prefers cooler weather.   CFIDS: still feels tired all the time, but symptoms waxes and wanes in severity. Post-activity fatigue is severe. Off stimulants, but does not want to go back on medication  She needs to nap most days - usually 2 hours.   Reflux esophagitis: choking and also esophageal thickenes on CT chest/abdomen. Seen by Dr. Durwin Reges she had an EGD and esophageal dilation, and was diagnosed with reflux esophagitis and is still on Dexilant, She states that symptoms are well controlled with medication, if she skips medication she has regurgitation at night and burning sensation  Major Depression: she is taking Paxil and Wellbutrin, she states symptoms are not in remission,she has anhedonia, and sometimes feels extra tired. She states sadness is not as intense. She still goes to church, takes care of her house and volunteers for her church. She has been worried about her dog lately, but hopeful that she will be healed  Mouth  sore: she has noticed left cheek pain and soreness, better with Ora jel. Symptoms started 4 days ago, it does not radiate   ASCUS: discussed negative HPV and recheck in one year  Cognitive impairment:she is taking one gabapentin in am and two at night ( she states she prefers not stopping the morning dose because it helps with pain) , she has been more forgetful, misplacing check book at home,lost her prescription glasses, offered her to see Neurologist but she wants to hold off for now.  OSA: she is wearing CPAP every night, all night. She tolerated mask well and it helps her with energy level in morning  Migraine headaches: no recent episodes  Dyslipidemia: she refuses statin therapy, last lipid panel reviewed with patient and it did not look good, discussed life style modification   Dog diagnosed with leptospirosis, getting treatment, she was advised by Vet to take medication for it, she has no symptoms at this time  Patient Active Problem List   Diagnosis Date Noted  . Cognitive decline 01/02/2017  . Chronic kidney disease, stage 3 ([place]) 09/23/2016  . Sleep apnea 07/11/2016  . ASCUS favor benign 07/10/2016  . Memory loss 05/12/2016  . Smell or taste sensation disturbance 05/12/2016  . Personal history of colonic polyps   . Benign neoplasm of transverse colon   . Rectal polyp   . Tenosynovitis of thumb 03/12/2016  . Bilateral knee pain 03/12/2016  . Reflux  esophagitis   . Stricture and stenosis of esophagus   . Abnormal findings-gastrointestinal tract   . Thickening of esophagus 08/03/2015  . Choking 07/04/2015  . Benign essential HTN 05/01/2015  . CFIDS (chronic fatigue and immune dysfunction syndrome) ([place]) 05/01/2015  . Major depression, recurrent, chronic ([place]) 05/01/2015  . Dyslipidemia 05/01/2015  . Fibromyalgia syndrome 05/01/2015  . Blood glucose elevated 05/01/2015  . Eczema intertrigo 05/01/2015  . Iron deficiency anemia due to chronic blood loss  05/01/2015  . Migraine without aura and without status migrainosus, not intractable 05/01/2015  . Excessive urination at night 05/01/2015  . Dysmetabolic syndrome 02/54/2706  . History of colonoscopy with polypectomy 05/01/2015  . Central sleep apnea 05/01/2015  . History of back surgery 05/01/2015  . Thickened nails 05/01/2015  . Malignant neoplasm of upper-outer quadrant of female breast, triple negative 06/13/2013    Past Surgical History:  Procedure Laterality Date  . BACK SURGERY    . BREAST BIOPSY Left 10/2012   +  . 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 BREAST LUMPECTOMY Left 12/06/12    lumpectomy with SN biopsy and power port placement...  . COLONOSCOPY  2013   Dr. Suzette Battiest   . SHOULDER SURGERY Left 2009  . TONSILLECTOMY  1965  . TUBAL LIGATION  1982  . UPPER GI ENDOSCOPY     Dr. Suzette Battiest    Family History  Problem Relation Age of Onset  . Breast cancer Mother 46  . Breast cancer Maternal Aunt   . Brain cancer Paternal Uncle        x 2 mat uncles    Social History   Socioeconomic History  . Marital status: Married    Spouse name: Not on file  . Number of children: Not on file  . Years of education: Not on file  . Highest education level: Not on file  Social Needs  . Financial resource strain: Not on file  . Food insecurity - worry: Not on file  . Food insecurity - inability: Not on file  . Transportation needs - medical: Not on file  . Transportation needs - non-medical: Not on file  Occupational History  . Not on file  Tobacco Use  . Smoking status: Former Smoker    Packs/day: 1.00    Years: 20.00    Pack years: 20.00    Types: Cigarettes    Last attempt to quit: 11/24/1980    Years since quitting: 36.8  . Smokeless tobacco: Never Used  Substance and Sexual Activity  . Alcohol use: Yes    Alcohol/week: 0.0 oz    Comment: rarely, 1-2x/mo  . Drug use: No  . Sexual activity: Yes    Partners: Male  Other Topics  Concern  . Not on file  Social History Narrative  . Not on file     Current Outpatient Medications:  .  ALPRAZolam (XANAX) 0.5 MG tablet, TAKE 1 TABLET BY MOUTH TWICE A DAY AS NEEDED FOR ANXIETY, Disp: 30 tablet, Rfl: 0 .  buPROPion (WELLBUTRIN XL) 300 MG 24 hr tablet, Take 1 tablet (300 mg total) by mouth daily., Disp: 90 tablet, Rfl: 1 .  dexlansoprazole (DEXILANT) 60 MG capsule, Take 1 capsule (60 mg total) by mouth daily., Disp: 90 capsule, Rfl: 1 .  docusate sodium (COLACE) 100 MG capsule, Take 100 mg by mouth 2 (two) times daily., Disp: , Rfl:  .  ferrous gluconate (FERGON) 324 MG tablet, TAKE 1 TABLET BY MOUTH  EVERY DAY WITH BREAKFAST, Disp: 30 tablet, Rfl: 5 .  gabapentin (NEURONTIN) 400 MG capsule, TAKE 1 CAPSULE IN THE MORNING AND TAKE 2 CAPSULES IN THE PM., Disp: 90 capsule, Rfl: 0 .  HYDROcodone-acetaminophen (NORCO) 10-325 MG tablet, Take 1 tablet by mouth 3 (three) times daily as needed. If patient does not respond to Tramadol To last 3 months, Disp: 45 tablet, Rfl: 0 .  Nerve Stimulator (STANDARD TENS) DEVI, 1 Units by Does not apply route daily., Disp: 1 Device, Rfl: 0 .  olmesartan-hydrochlorothiazide (BENICAR HCT) 40-25 MG tablet, Take 1 tablet by mouth daily., Disp: 90 tablet, Rfl: 1 .  ondansetron (ZOFRAN) 4 MG tablet, TAKE 1 TABLET EVERY 8 HOURS AS NEEDED FOR NAUSEA AND VOMITING, Disp: 20 tablet, Rfl: 0 .  PARoxetine (PAXIL) 40 MG tablet, Take 1 tablet (40 mg total) by mouth every morning., Disp: 90 tablet, Rfl: 1 .  doxycycline (MONODOX) 100 MG capsule, Take 1 capsule (100 mg total) 2 (two) times daily by mouth., Disp: 14 capsule, Rfl: 0 .  EPIPEN 2-PAK 0.3 MG/0.3ML SOAJ injection, Reported on 04/28/2016, Disp: , Rfl: 1 .  Multiple Vitamins-Minerals (CENTRUM SILVER ADULT 50+ PO), Take by mouth., Disp: , Rfl:   Allergies  Allergen Reactions  . Ace Inhibitors Swelling  . Pregabalin     weight gain and blurred vision  . Venlafaxine     foggy minded      ROS  Constitutional: Negative for fever or weight change.  Respiratory: Negative for cough and shortness of breath.   Cardiovascular: Negative for chest pain or palpitations.  Gastrointestinal: Negative for abdominal pain, no bowel changes.  Musculoskeletal: Negative for gait problem or joint swelling.  Skin: Negative for rash.  Neurological: Negative for dizziness or headache.  No other specific complaints in a complete review of systems (except as listed in HPI above).  Objective  Vitals:   10/02/17 0934  BP: 118/74  Pulse: 87  Resp: 16  Temp: 98.3 F (36.8 C)  TempSrc: Oral  SpO2: 96%  Weight: 192 lb 8 oz (87.3 kg)  Height: '5\' 1"'$  (1.549 m)    Body mass index is 36.37 kg/m.  Physical Exam  Constitutional: Patient appears well-developed and well-nourished. Obese  No distress.  HEENT: head atraumatic, normocephalic, pupils equal and reactive to light, neck supple, throat within normal limits Cardiovascular: Normal rate, regular rhythm and normal heart sounds.  No murmur heard. No BLE edema. Pulmonary/Chest: Effort normal and breath sounds normal. No respiratory distress. Muscular Skeletal: Trigger points positive  Abdominal: Soft.  There is no tenderness. Psychiatric: Patient has a normal mood and affect. behavior is normal. Judgment and thought content normal.  Recent Results (from the past 2160 hour(s))  Cancer antigen 27.29     Status: None   Collection Time: 09/21/17 11:38 AM  Result Value Ref Range   CA 27.29 21.1 0.0 - 38.6 U/mL    Comment: (NOTE) [facility]/ACS methodology Performed At: [facility] [place], [place] [id number] Lindon Romp MD NI:[id number]   CBC with Differential/Platelet     Status: Abnormal   Collection Time: 09/21/17 11:38 AM  Result Value Ref Range   WBC 8.2 3.6 - 11.0 K/uL   RBC 3.82 3.80 - 5.20 MIL/uL   Hemoglobin 11.8 (L) 12.0 - 16.0 g/dL   HCT 34.9 (L) 35.0 - 47.0 %   MCV 91.4 80.0 - 100.0  fL   MCH 30.8 26.0 - 34.0 pg   MCHC 33.6 32.0 - 36.0  g/dL   RDW 13.9 11.5 - 14.5 %   Platelets 211 150 - 440 K/uL   Neutrophils Relative % 79 %   Neutro Abs 6.4 1.4 - 6.5 K/uL   Lymphocytes Relative 15 %   Lymphs Abs 1.3 1.0 - 3.6 K/uL   Monocytes Relative 4 %   Monocytes Absolute 0.3 0.2 - 0.9 K/uL   Eosinophils Relative 1 %   Eosinophils Absolute 0.1 0 - 0.7 K/uL   Basophils Relative 1 %   Basophils Absolute 0.1 0 - 0.1 K/uL  Comprehensive metabolic panel     Status: Abnormal   Collection Time: 09/21/17 11:38 AM  Result Value Ref Range   Sodium 137 135 - 145 mmol/L   Potassium 3.9 3.5 - 5.1 mmol/L   Chloride 100 (L) 101 - 111 mmol/L   CO2 28 22 - 32 mmol/L   Glucose, Bld 102 (H) 65 - 99 mg/dL   BUN 19 6 - 20 mg/dL   Creatinine, Ser 1.03 (H) 0.44 - 1.00 mg/dL   Calcium 9.1 8.9 - 10.3 mg/dL   Total Protein 6.8 6.5 - 8.1 g/dL   Albumin 3.8 3.5 - 5.0 g/dL   AST 27 15 - 41 U/L   ALT 25 14 - 54 U/L   Alkaline Phosphatase 108 38 - 126 U/L   Total Bilirubin 0.7 0.3 - 1.2 mg/dL   GFR calc non Af Amer 60 (L) >60 mL/min   GFR calc Af Amer >60 >60 mL/min    Comment: (NOTE) The eGFR has been calculated using the CKD EPI equation. This calculation has not been validated in all clinical situations. eGFR's persistently <60 mL/min signify possible Chronic Kidney Disease.    Anion gap 9 5 - 15  Ferritin     Status: None   Collection Time: 09/21/17 11:38 AM  Result Value Ref Range   Ferritin 34 11 - 307 ng/mL  Pap IG and HPV (high risk) DNA detection     Status: None   Collection Time: 09/25/17  2:50 PM  Result Value Ref Range   Clinical Information:      Comment: None given   LMP:      Comment: NA   PREV. PAP:      Comment: NA   PREV. BX:      Comment: NA   HPV DNA Probe-Source      Comment: Endocervix   STATEMENT OF ADEQUACY:      Comment: SATISFACTORY FOR EVALUATION   INTERPRETATION/RESULT:      Comment: Negative for intraepithelial lesion or malignancy.   Comment:       Comment: This Pap test has been evaluated with computer assisted technology.    CYTOTECHNOLOGIST:      Comment: JLT, CT(ASCP) CT screening location: [address], [place], [place] [address]    HPV DNA High Risk Not Detected Not Detect    Comment: This test was performed using the APTIMA HPV Assay ([employer] . This assay detects E6/E7 viral messenger RNA (mRNA) from 14 high-risk HPV types (16,18,31,33,35,39,45,51,52,56,58,59,66,68). . The analytical performance characteristics of this assay have been determined by [facility]. The modifications have not been cleared or approved by the FDA. This assay has been validated pursuant to the CLIA regulations and is used for clinical purposes. EXPLANATORY NOTE:  . The Pap is a screening test for cervical cancer. It is  not a diagnostic test and is subject to false negative  and false positive results. It is most reliable when  a  satisfactory sample, regularly obtained, is submitted  with relevant clinical findings and history, and when  the Pap result is evaluated along with historic and  current clinical information. .       PHQ2/9: Depression screen [facility] 2/9 09/25/2017 07/03/2017 01/02/2017 10/02/2016 07/11/2016  Decreased Interest 0 0 0 0 0  Down, Depressed, Hopeless 0 0 1 0 0  PHQ - 2 Score 0 0 1 0 0  Altered sleeping - - - - -  Tired, decreased energy - - - - -  Change in appetite - - - - -  Feeling bad or failure about yourself  - - - - -  Trouble concentrating - - - - -  Moving slowly or fidgety/restless - - - - -  Suicidal thoughts - - - - -  PHQ-9 Score - - - - -  Difficult doing work/chores - - - - -    Fall Risk: Fall Risk  09/25/2017 07/03/2017 01/02/2017 10/02/2016 07/11/2016  Falls in the past year? No No No No No  Comment - - - - -  Number falls in past yr: - - - - -  Comment - - - - -  Injury with Fall? - - - - -      Assessment & Plan  1. Benign essential HTN  At goal , continue  medication   2. Fibromyalgia syndrome  Stable  3. Major depression, recurrent, chronic ([facility])  Taking medication as prescribed  4. CFIDS (chronic fatigue and immune dysfunction syndrome) ([facility])  Stable  5. Migraine without aura and without status migrainosus, not intractable  No recent episodes  6. Gastro-esophageal reflux disease without esophagitis  Stable at this time  7. Dyslipidemia  She refuses statin therapy  8. Obstructive sleep apnea syndrome  Continue CPAP   9. Leptospirosis  Her dog was diagnosed, she has not symptoms but wants to take medication for it  Doxy 100 mg twice daily sent to pharmacy

## 2017-10-23 ENCOUNTER — Ambulatory Visit: Payer: BLUE CROSS/BLUE SHIELD | Admitting: Radiation Oncology

## 2017-10-23 IMAGING — MG MM DIGITAL DIAGNOSTIC BILAT W/ TOMO W/ CAD
8 of 15 series · 8 of 31 positions shown · non-contrast
Comparison: Previous exam(s).

CLINICAL DATA: 55-year-old female with history of left lumpectomy
in November 2012.

EXAM:
2D DIGITAL DIAGNOSTIC BILATERAL MAMMOGRAM WITH CAD AND ADJUNCT TOMO

[L MLO (1 of 3)]
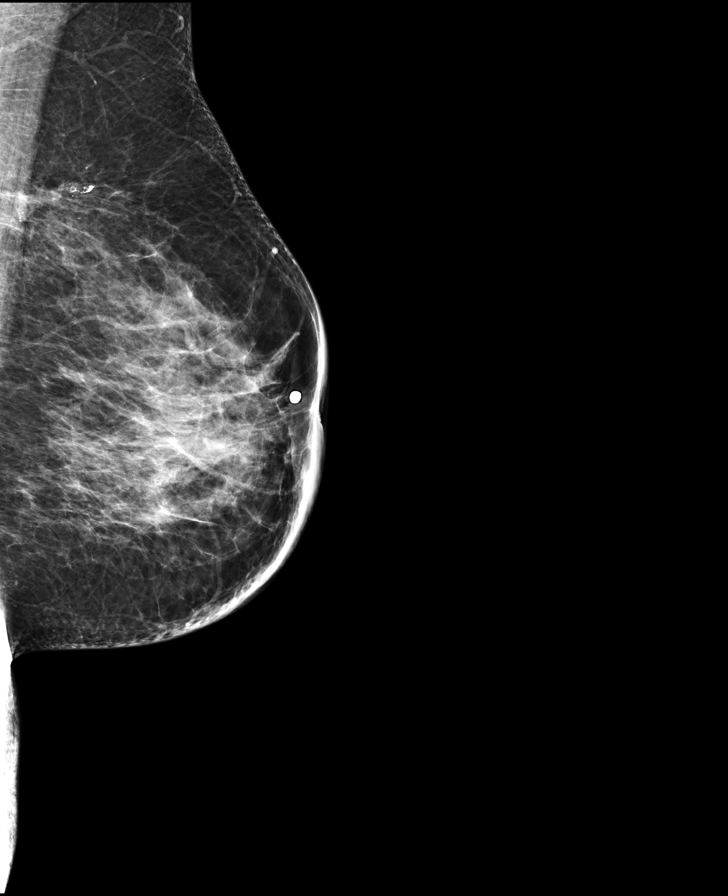

[L MLO (2 of 3)]
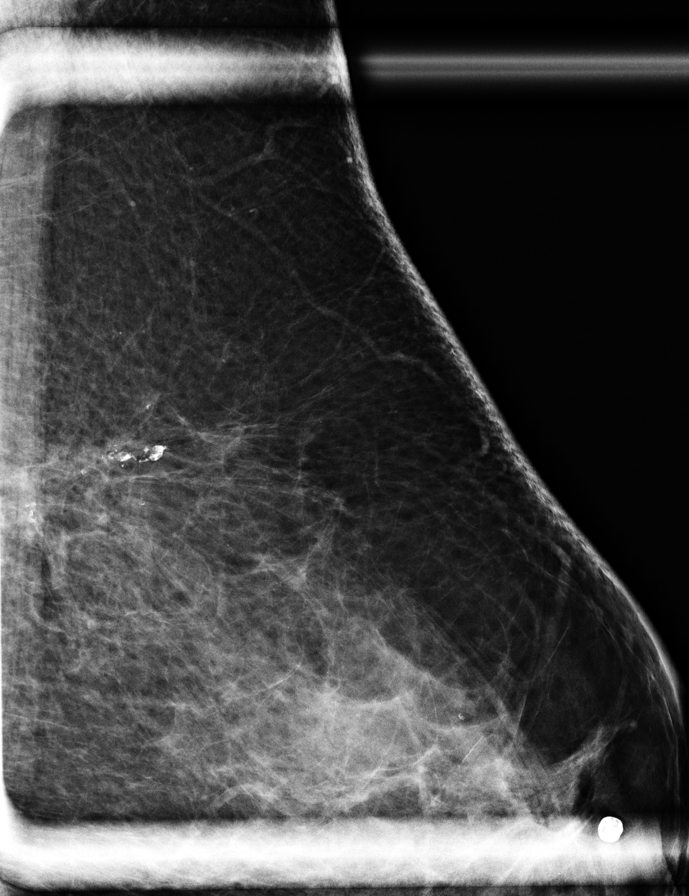

[L CC]
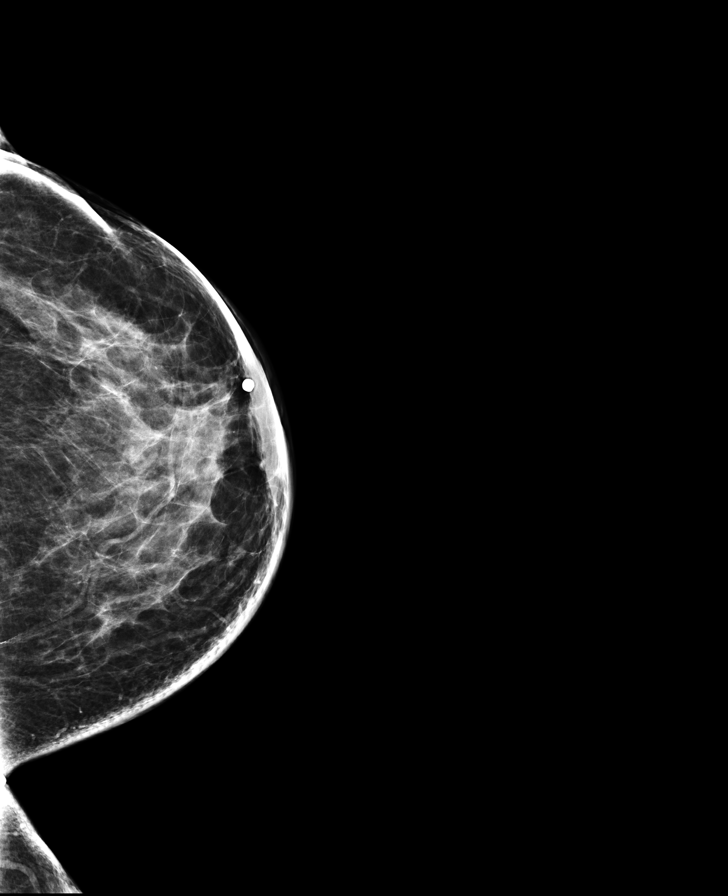

[R MLO synth-2D]
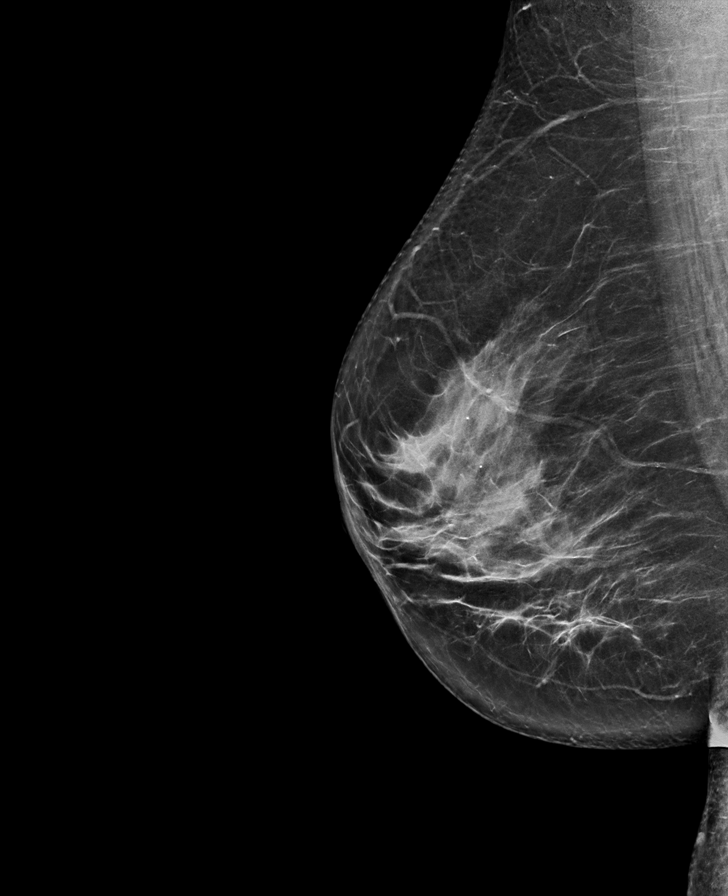

[R CC synth-2D]
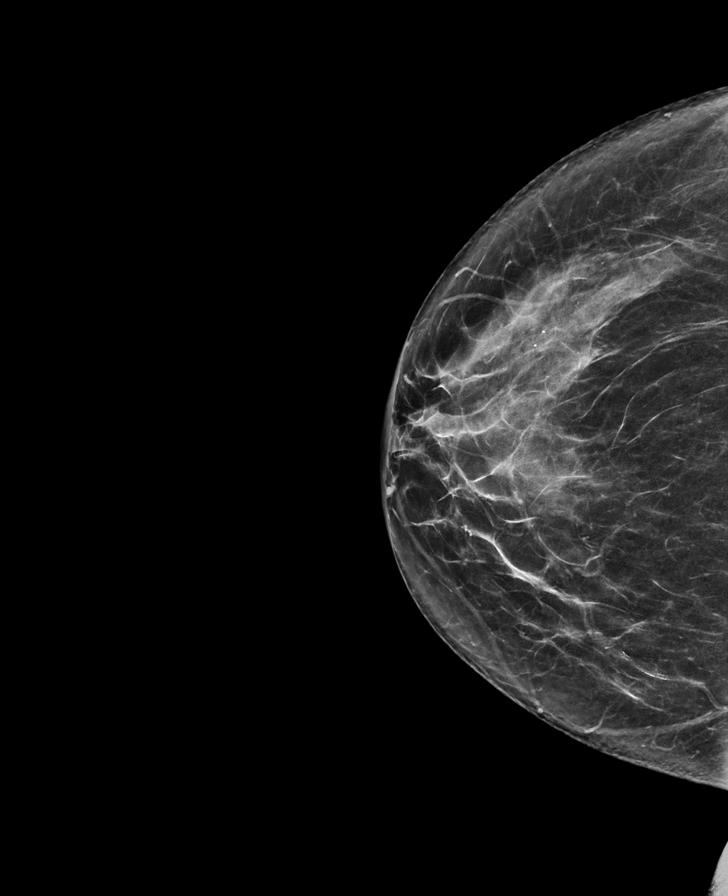

[R CC]
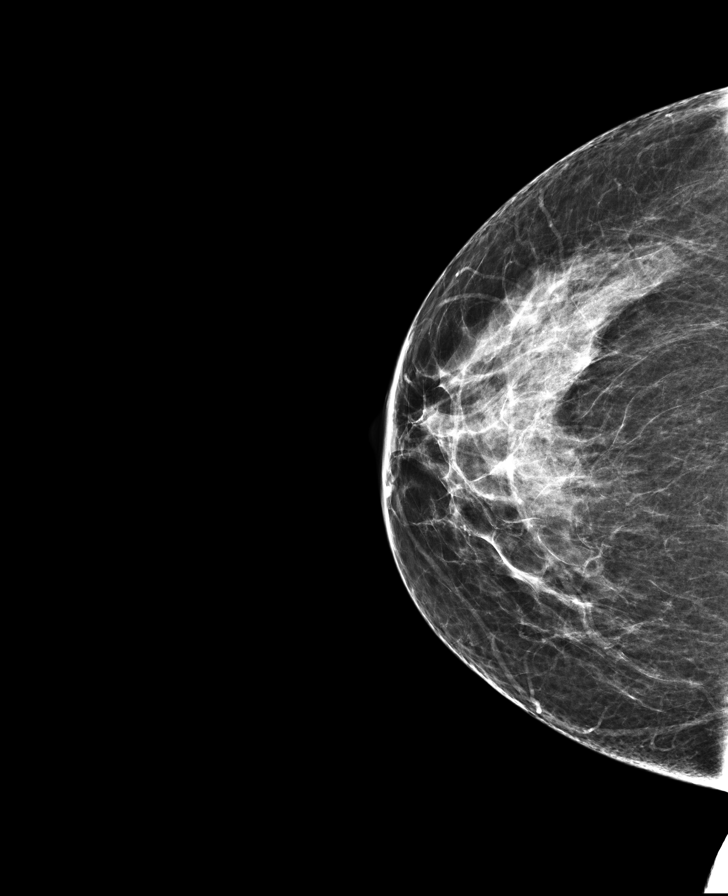

[L MLO (3 of 3)]
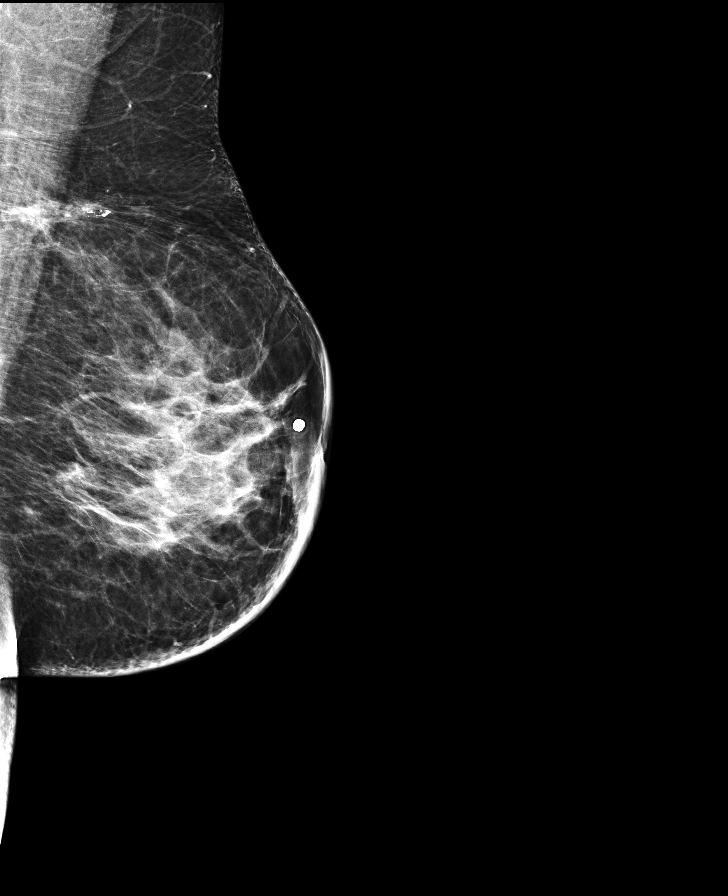

[R MLO]
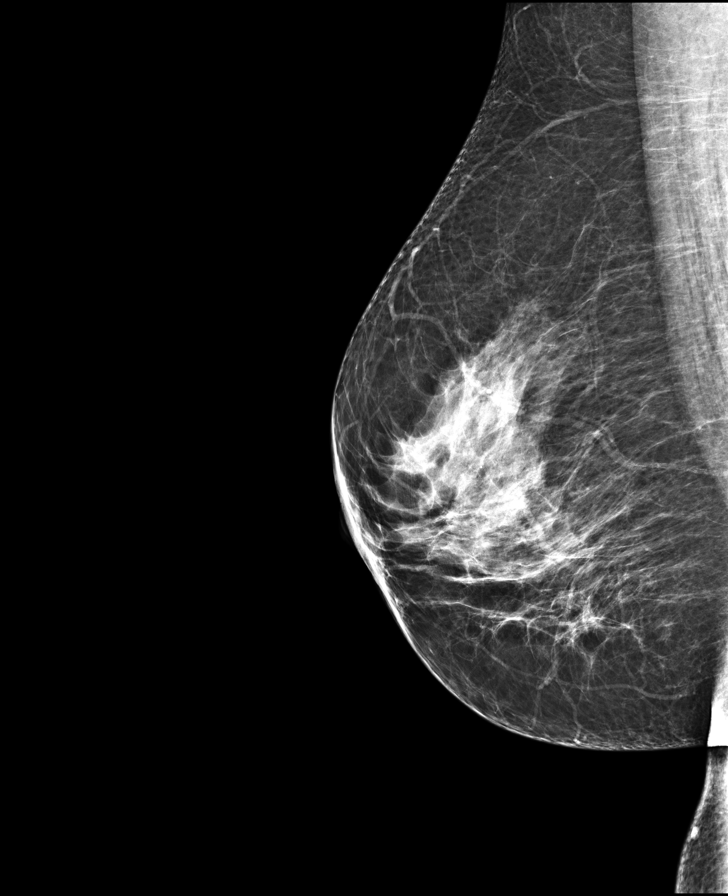

[8 of 31 positions shown; findings below may reference images not displayed]

ACR Breast Density Category c: The breast tissue is heterogeneously
dense, which may obscure small masses.
FINDINGS: Posttreatment changes are seen of the upper, outer left breast. No
new or suspicious abnormality is identified within either breast.

Mammographic images were processed with CAD.
IMPRESSION: No mammographic evidence of malignancy.

RECOMMENDATION:
Bilateral diagnostic mammogram in 1 year.

I have discussed the findings and recommendations with the patient.
Results were also provided in writing at the conclusion of the
visit. If applicable, a reminder letter will be sent to the patient
regarding the next appointment.

BI-RADS CATEGORY  2: Benign.

## 2017-10-26 ENCOUNTER — Other Ambulatory Visit: Payer: Self-pay | Admitting: Family Medicine

## 2017-10-26 DIAGNOSIS — M797 Fibromyalgia: Secondary | ICD-10-CM

## 2017-10-30 ENCOUNTER — Other Ambulatory Visit: Payer: Self-pay

## 2017-10-30 ENCOUNTER — Encounter: Payer: Self-pay | Admitting: Radiation Oncology

## 2017-10-30 ENCOUNTER — Ambulatory Visit
Admission: RE | Admit: 2017-10-30 | Discharge: 2017-10-30 | Disposition: A | Discharge status: Home / Self Care | Payer: BLUE CROSS/BLUE SHIELD | Source: Ambulatory Visit | Attending: Radiation Oncology | Admitting: Radiation Oncology

## 2017-10-30 VITALS — BP 128/77 | HR 65 | Temp 98.1°F | Resp 18 | Wt 192.1 lb

## 2017-10-30 DIAGNOSIS — Z853 Personal history of malignant neoplasm of breast: Secondary | ICD-10-CM | POA: Insufficient documentation

## 2017-10-30 DIAGNOSIS — C50919 Malignant neoplasm of unspecified site of unspecified female breast: Secondary | ICD-10-CM

## 2017-10-30 DIAGNOSIS — Z923 Personal history of irradiation: Secondary | ICD-10-CM | POA: Insufficient documentation

## 2017-10-30 DIAGNOSIS — C50412 Malignant neoplasm of upper-outer quadrant of left female breast: Secondary | ICD-10-CM

## 2017-10-30 MED ORDER — FERROUS GLUCONATE 324 (38 FE) MG PO TABS: ORAL_TABLET | ORAL | 5 refills | Status: DC

## 2017-10-30 NOTE — Progress Notes (Signed)
Radiation Oncology Follow up Note  Name: Deanna Baker   Date:   10/30/2017 MRN:  [id number] DOB: [birth year]    This 56 y.o. 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  REFERRING PROVIDER: Steele Sizer, MD  HPI: Patient is a 56 year old female now seen out 4 years having completed whole breast radiation to her left breast for stage IC (T1 CN 0 M0) invasive mammary carcinoma triple negative. She is seen today in routine follow-up and is doing well. She specifically denies breast tenderness cough or bone pain.Marland Kitchen Her last mammogram which I have reviewed was 1 year prior was BI-RADS 2 benign. She is not on antiestrogen therapy based on the triple negative nature of her disease.  COMPLICATIONS OF TREATMENT: none  FOLLOW UP COMPLIANCE: keeps appointments   PHYSICAL EXAM:  BP 128/77   Pulse 65   Temp 98.1 F (36.7 C)   Resp 18   Wt 192 lb 2.1 oz (87.1 kg)   BMI 36.30 kg/m  Lungs are clear to A&P cardiac examination essentially unremarkable with regular rate and rhythm. No dominant mass or nodularity is noted in either breast in 2 positions examined. Incision is well-healed. No axillary or supraclavicular adenopathy is appreciated. Cosmetic result is excellent. Well-developed well-nourished patient in NAD. HEENT reveals PERLA, EOMI, discs not visualized.  Oral cavity is clear. No oral mucosal lesions are identified. Neck is clear without evidence of cervical or supraclavicular adenopathy. Lungs are clear to A&P. Cardiac examination is essentially unremarkable with regular rate and rhythm without murmur rub or thrill. Abdomen is benign with no organomegaly or masses noted. Motor sensory and DTR levels are equal and symmetric in the upper and lower extremities. Cranial nerves II through XII are grossly intact. Proprioception is intact. No peripheral adenopathy or edema is  identified. No motor or sensory levels are noted. Crude visual fields are within normal range.  RADIOLOGY RESULTS: Mammograms are reviewed and compatible with the above-stated findings  PLAN: Present time she is now 4 years out doing well with no evidence of disease. I am going to discontinue follow-up care based on patient's wishes. I would be happy to reevaluate her any time should further opinion be indicated. Patient continues close follow-up care with medical oncology.  I would like to take this opportunity to thank you for allowing me to participate in the care of your patient.Armstead Peaks., MD

## 2017-10-30 NOTE — Telephone Encounter (Signed)
Refill request for general medication: Fergon tablet  Last office visit: 10/02/2017  Last physical exam: 09/25/2017  Follow up visit: 01/01/2018

## 2017-11-02 ENCOUNTER — Other Ambulatory Visit: Payer: Self-pay | Admitting: Family Medicine

## 2017-11-02 DIAGNOSIS — C50412 Malignant neoplasm of upper-outer quadrant of left female breast: Secondary | ICD-10-CM

## 2017-11-23 ENCOUNTER — Telehealth: Payer: Self-pay | Admitting: Family Medicine

## 2017-11-23 NOTE — Telephone Encounter (Signed)
Copied from [place]. Topic: Quick Communication - Rx Refill/Question >> Nov 23, 2017 11:54 AM Cecelia Byars, NT wrote: Reason for CRM: Patient would like to pick up a prescription for vicoden as soon as possible says ,she has been in a lot of pain

## 2017-11-24 ENCOUNTER — Other Ambulatory Visit: Payer: Self-pay | Admitting: Family Medicine

## 2017-11-24 NOTE — Telephone Encounter (Signed)
She needs to be seen. Vicodin no longer on her active medication list.

## 2017-11-25 NOTE — Telephone Encounter (Signed)
Please schedule patient an appointment for medication. Thanks.

## 2017-11-25 NOTE — Telephone Encounter (Signed)
Pt needs an appt before she can get a refill on her Vicodin due to this not being on her med list

## 2017-12-04 ENCOUNTER — Other Ambulatory Visit: Payer: Self-pay | Admitting: Family Medicine

## 2017-12-04 NOTE — Telephone Encounter (Signed)
Hypertension medication request: Benicar to [facility]  Last office visit pertaining to hypertension: 10/02/2017  BP Readings from Last 3 Encounters:  10/30/17 128/77  10/02/17 118/74  09/25/17 100/66    Lab Results  Component Value Date   CREATININE 1.03 (H) 09/21/2017   BUN 19 09/21/2017   NA 137 09/21/2017   K 3.9 09/21/2017   CL 100 (L) 09/21/2017   CO2 28 09/21/2017     Follow up on 01/01/2018

## 2017-12-21 ENCOUNTER — Ambulatory Visit
Admission: RE | Admit: 2017-12-21 | Discharge: 2017-12-21 | Disposition: A | Discharge status: Home / Self Care | Payer: BLUE CROSS/BLUE SHIELD | Source: Ambulatory Visit | Attending: Hematology and Oncology | Admitting: Hematology and Oncology

## 2017-12-21 DIAGNOSIS — C50412 Malignant neoplasm of upper-outer quadrant of left female breast: Secondary | ICD-10-CM

## 2017-12-21 DIAGNOSIS — Z853 Personal history of malignant neoplasm of breast: Secondary | ICD-10-CM | POA: Insufficient documentation

## 2017-12-21 DIAGNOSIS — Z171 Estrogen receptor negative status [ER-]: Secondary | ICD-10-CM | POA: Diagnosis present

## 2017-12-21 DIAGNOSIS — Z9889 Other specified postprocedural states: Secondary | ICD-10-CM | POA: Insufficient documentation

## 2017-12-21 HISTORY — DX: Personal history of irradiation: Z92.3

## 2017-12-24 ENCOUNTER — Other Ambulatory Visit: Payer: Self-pay | Admitting: Family Medicine

## 2017-12-24 DIAGNOSIS — M797 Fibromyalgia: Secondary | ICD-10-CM

## 2018-01-01 ENCOUNTER — Ambulatory Visit: Payer: BLUE CROSS/BLUE SHIELD | Admitting: Family Medicine

## 2018-01-01 ENCOUNTER — Encounter: Payer: Self-pay | Admitting: Family Medicine

## 2018-01-01 VITALS — BP 116/64 | HR 82 | Temp 98.0°F | Resp 16 | Ht 61.0 in | Wt 192.1 lb

## 2018-01-01 DIAGNOSIS — K219 Gastro-esophageal reflux disease without esophagitis: Secondary | ICD-10-CM

## 2018-01-01 DIAGNOSIS — G4733 Obstructive sleep apnea (adult) (pediatric): Secondary | ICD-10-CM | POA: Diagnosis not present

## 2018-01-01 DIAGNOSIS — F339 Major depressive disorder, recurrent, unspecified: Secondary | ICD-10-CM | POA: Diagnosis not present

## 2018-01-01 DIAGNOSIS — E785 Hyperlipidemia, unspecified: Secondary | ICD-10-CM

## 2018-01-01 DIAGNOSIS — G9332 Myalgic encephalomyelitis/chronic fatigue syndrome: Secondary | ICD-10-CM

## 2018-01-01 DIAGNOSIS — R5382 Chronic fatigue, unspecified: Secondary | ICD-10-CM

## 2018-01-01 DIAGNOSIS — I10 Essential (primary) hypertension: Secondary | ICD-10-CM

## 2018-01-01 DIAGNOSIS — M797 Fibromyalgia: Secondary | ICD-10-CM

## 2018-01-01 DIAGNOSIS — D8989 Other specified disorders involving the immune mechanism, not elsewhere classified: Secondary | ICD-10-CM | POA: Diagnosis not present

## 2018-01-01 DIAGNOSIS — G43009 Migraine without aura, not intractable, without status migrainosus: Secondary | ICD-10-CM

## 2018-01-01 DIAGNOSIS — R4189 Other symptoms and signs involving cognitive functions and awareness: Secondary | ICD-10-CM

## 2018-01-01 MED ORDER — HYDROCODONE-ACETAMINOPHEN 10-325 MG PO TABS: 1.0000 | ORAL_TABLET | Freq: Three times a day (TID) | ORAL | 0 refills | Status: DC | PRN

## 2018-01-01 MED ORDER — PAROXETINE HCL 40 MG PO TABS: 40.0000 mg | ORAL_TABLET | ORAL | 1 refills | Status: DC

## 2018-01-01 MED ORDER — DEXLANSOPRAZOLE 60 MG PO CPDR: 60.0000 mg | DELAYED_RELEASE_CAPSULE | Freq: Every day | ORAL | 1 refills | Status: DC

## 2018-01-01 NOTE — Progress Notes (Signed)
Name: Deanna Baker   MRN: [id number]    DOB: 1961/08/18   Date:01/01/2018       Progress Note  Subjective  Chief Complaint  Chief Complaint  Patient presents with  . Medication Refill    3 month F/U  . Hypertension    Edema in left ankle  . Fibromyalgia    Has not been good  . Gastroesophageal Reflux    Has been ok with Dexilant every night  . Depression  . Sleep Apnea    Has been pretty good other than her animals waking her up. Will sleep 5 to 6 hours nightly    HPI  HTN: she has been taking medication and no chest pain, no dizziness palpitation or SOB. No side effects of medication, sheis onBenicar HCTZ.  FMS: she continues to have daily pain, taking pain medications :gabapentin, and antidepressant, prn Hydrocodone ( Tramadol did not work for her)and sometimes with Zofran to control nausea. She has daily mental fogginess, trigger point painsand generalized body aches. Current pain is 5/10.Average pain 3-4/10.   CFIDS: still feels tired all the time, but symptoms waxes and wanes in severity. Post-activity fatigue is severe. Off stimulants, but does not want to go back on medication She needs to nap most days - usually 2 hours. She states feels much worse when she is more active .  Rib cage pain: from leaning over a chest freezer - cleaning it up - yesterday. No SOB  Reflux esophagitis: choking and also esophageal thickenes on CT chest/abdomen. Seen by Dr. Durwin Reges she had an EGD and esophageal dilation, and was diagnosed with reflux esophagitis and is still on Dexilant, She states that symptoms are well controlled with medication, if she skips medication she has regurgitation at night and burning sensation  Major Depression: she is taking Paxil and Wellbutrin, she states symptoms are not in remission,she has anhedonia, and sometimes feels extra tired. She states she is not feeling sad.  She still goes to church, takes care of her house and volunteers for her  church.She is concerned about possible side effects of Wellbutrin and wants to stop medication for now and see if symptoms improve.   ASCUS: discussed negative HPV , and repeat pap last year was normal, recheck yearly for a couple more years before going to every 3 years after that   Cognitive impairment:she is taking one gabapentin in am and two at night ( she states she prefers not stopping the morning dose because it helps with pain) , she has been more forgetful, she thinks secondary to Wellbutrin and wants to stop medication   OSA: she is wearing CPAP every night, all night. She tolerated mask well and it helps her with energy level in morning  Migraine headaches: no recent episodes  Dyslipidemia: she refuses statin therapy, last lipid panel reviewed with patient and it did not look good, discussed life style modification, including eating more fish and tree nuts and try to exercise more often - as tolerated    Patient Active Problem List   Diagnosis Date Noted  . Cognitive decline 01/02/2017  . Chronic kidney disease, stage 3 ([place]) 09/23/2016  . Sleep apnea 07/11/2016  . ASCUS favor benign 07/10/2016  . Memory loss 05/12/2016  . Smell or taste sensation disturbance 05/12/2016  . Personal history of colonic polyps   . Benign neoplasm of transverse colon   . Rectal polyp   . Tenosynovitis of thumb 03/12/2016  . Bilateral knee pain 03/12/2016  .  Reflux esophagitis   . Stricture and stenosis of esophagus   . Abnormal findings-gastrointestinal tract   . Thickening of esophagus 08/03/2015  . Choking 07/04/2015  . Benign essential HTN 05/01/2015  . CFIDS (chronic fatigue and immune dysfunction syndrome) ([place]) 05/01/2015  . Major depression, recurrent, chronic ([place]) 05/01/2015  . Dyslipidemia 05/01/2015  . Fibromyalgia syndrome 05/01/2015  . Blood glucose elevated 05/01/2015  . Eczema intertrigo 05/01/2015  . Iron deficiency anemia due to chronic blood loss 05/01/2015  .  Migraine without aura and without status migrainosus, not intractable 05/01/2015  . Excessive urination at night 05/01/2015  . Dysmetabolic syndrome 51/12/5850  . History of colonoscopy with polypectomy 05/01/2015  . Central sleep apnea 05/01/2015  . History of back surgery 05/01/2015  . Thickened nails 05/01/2015  . Malignant neoplasm of upper-outer quadrant of female breast, triple negative 06/13/2013    Past Surgical History:  Procedure Laterality Date  . BACK SURGERY    . BREAST BIOPSY Left 10/2012   +  . 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 BREAST LUMPECTOMY Left 12/06/12    lumpectomy with SN biopsy and power port placement...  . COLONOSCOPY  2013   Dr. Suzette Battiest   . COLONOSCOPY WITH PROPOFOL N/A 04/28/2016   Procedure: COLONOSCOPY WITH PROPOFOL;  Surgeon: Lucilla Lame, MD;  Location: [facility];  Service: Endoscopy;  Laterality: N/A;  CPAP  . ESOPHAGOGASTRODUODENOSCOPY (EGD) WITH PROPOFOL N/A 10/23/2015   Procedure: ESOPHAGOGASTRODUODENOSCOPY (EGD) WITH PROPOFOL;  Surgeon: Lucilla Lame, MD;  Location: [facility];  Service: Endoscopy;  Laterality: N/A;  . POLYPECTOMY  04/28/2016   Procedure: POLYPECTOMY;  Surgeon: Lucilla Lame, MD;  Location: [facility];  Service: Endoscopy;;  . SHOULDER SURGERY Left 2009  . TONSILLECTOMY  1965  . TUBAL LIGATION  1982  . UPPER GI ENDOSCOPY     Dr. Suzette Battiest    Family History  Problem Relation Age of Onset  . Breast cancer Mother 60  . Breast cancer Maternal Aunt   . Brain cancer Paternal Uncle        x 2 mat uncles    Social History   Socioeconomic History  . Marital status: Married    Spouse name: Not on file  . Number of children: Not on file  . Years of education: Not on file  . Highest education level: Not on file  Social Needs  . Financial resource strain: Not on file  . Food insecurity - worry: Not on file  . Food insecurity - inability: Not on file  . Transportation needs -  medical: Not on file  . Transportation needs - non-medical: Not on file  Occupational History  . Not on file  Tobacco Use  . Smoking status: Former Smoker    Packs/day: 1.00    Years: 20.00    Pack years: 20.00    Types: Cigarettes    Last attempt to quit: 11/24/1980    Years since quitting: 37.1  . Smokeless tobacco: Never Used  Substance and Sexual Activity  . Alcohol use: Yes    Alcohol/week: 0.0 oz    Comment: rarely, 1-2x/mo  . Drug use: No  . Sexual activity: Yes    Partners: Male  Other Topics Concern  . Not on file  Social History Narrative  . Not on file     Current Outpatient Medications:  .  ALPRAZolam (XANAX) 0.5 MG tablet, TAKE 1 TABLET BY MOUTH TWICE A DAY AS NEEDED FOR ANXIETY,  Disp: 30 tablet, Rfl: 0 .  dexlansoprazole (DEXILANT) 60 MG capsule, Take 1 capsule (60 mg total) by mouth daily., Disp: 90 capsule, Rfl: 1 .  docusate sodium (COLACE) 100 MG capsule, Take 100 mg by mouth 2 (two) times daily., Disp: , Rfl:  .  EPIPEN 2-PAK 0.3 MG/0.3ML SOAJ injection, Reported on 04/28/2016, Disp: , Rfl: 1 .  Ferrous Gluconate (IRON) 240 (27 Fe) MG TABS, Take 1 tablet by mouth every morning. , Disp: , Rfl: 0 .  gabapentin (NEURONTIN) 400 MG capsule, TAKE 1 CAPSULE IN THE MORNING AND TAKE 2 CAPSULES IN THE EVENING, Disp: 90 capsule, Rfl: 1 .  Nerve Stimulator (STANDARD TENS) DEVI, 1 Units by Does not apply route daily., Disp: 1 Device, Rfl: 0 .  olmesartan-hydrochlorothiazide (BENICAR HCT) 40-25 MG tablet, TAKE 1 TABLET BY MOUTH DAILY, Disp: 90 tablet, Rfl: 1 .  ondansetron (ZOFRAN) 4 MG tablet, TAKE 1 TABLET EVERY 8 HOURS AS NEEDED FOR NAUSEA AND VOMITING, Disp: 20 tablet, Rfl: 0 .  PARoxetine (PAXIL) 40 MG tablet, Take 1 tablet (40 mg total) by mouth every morning., Disp: 90 tablet, Rfl: 1 .  Multiple Vitamins-Minerals (CENTRUM SILVER ADULT 50+ PO), Take by mouth., Disp: , Rfl:   Allergies  Allergen Reactions  . Ace Inhibitors Swelling  . Pregabalin     weight gain and  blurred vision  . Venlafaxine     foggy minded     ROS  Constitutional: Negative for fever or weight change.  Respiratory: Negative for cough and shortness of breath.   Cardiovascular: Negative for chest pain or palpitations.  Gastrointestinal: Negative for abdominal pain, no bowel changes.  Musculoskeletal: Negative for gait problem or joint swelling.  Skin: Negative for rash.  Neurological: Negative for dizziness or headache.  No other specific complaints in a complete review of systems (except as listed in HPI above).  Objective  Vitals:   01/01/18 0948  BP: 116/64  Pulse: 82  Resp: 16  Temp: 98 F (36.7 C)  TempSrc: Oral  SpO2: 96%  Weight: 192 lb 1.6 oz (87.1 kg)  Height: 5\' 1"  (1.549 m)    Body mass index is 36.3 kg/m.  Physical Exam  Constitutional: Patient appears well-developed and well-nourished. Obese  She seemed uncomfortable, moving around - sitting and shifting with standing HEENT: head atraumatic, normocephalic, pupils equal and reactive to light, neck supple, throat within normal limits Cardiovascular: Normal rate, regular rhythm and normal heart sounds.  No murmur heard. No BLE edema. Pulmonary/Chest: Effort normal and breath sounds normal. No respiratory distress. Muscular Skeletal: Trigger points positive , bruise under right rib cage ( was cleaning a freezer last night) . Trigger point positive  Abdominal: Soft.  There is no tenderness. Psychiatric: Patient has a normal mood and affect. behavior is normal. Judgment and thought content normal.   PHQ2/9: Depression screen [facility] 2/9 10/30/2017 09/25/2017 07/03/2017 01/02/2017 10/02/2016  Decreased Interest 0 0 0 0 0  Down, Depressed, Hopeless 0 0 0 1 0  PHQ - 2 Score 0 0 0 1 0  Altered sleeping - - - - -  Tired, decreased energy - - - - -  Change in appetite - - - - -  Feeling bad or failure about yourself  - - - - -  Trouble concentrating - - - - -  Moving slowly or fidgety/restless - - - - -   Suicidal thoughts - - - - -  PHQ-9 Score - - - - -  Difficult doing work/chores - - - - -  Fall Risk: Fall Risk  01/01/2018 10/30/2017 09/25/2017 07/03/2017 01/02/2017  Falls in the past year? No No No No No  Comment - - - - -  Number falls in past yr: - - - - -  Comment - - - - -  Injury with Fall? - - - - -     Functional Status Survey: Is the patient deaf or have difficulty hearing?: Yes Does the patient have difficulty seeing, even when wearing glasses/contacts?: Yes Does the patient have difficulty concentrating, remembering, or making decisions?: Yes Does the patient have difficulty walking or climbing stairs?: No Does the patient have difficulty dressing or bathing?: No Does the patient have difficulty doing errands alone such as visiting a doctor's office or shopping?: No   Assessment & Plan  1. Benign essential HTN  At goal, denies dizziness  2. Fibromyalgia syndrome  Last rx filled 07/208, needs a refill today, taking medication prn only  - HYDROcodone-acetaminophen (NORCO) 10-325 MG tablet; Take 1 tablet by mouth every 8 (eight) hours as needed.  Dispense: 45 tablet; Refill: 0  3. Major depression, recurrent, chronic ([facility])  - PARoxetine (PAXIL) 40 MG tablet; Take 1 tablet (40 mg total) by mouth every morning.  Dispense: 90 tablet; Refill: 1  4. CFIDS (chronic fatigue and immune dysfunction syndrome) ([facility])  It is worse this time of the year, cold days are not good for her  5. Gastro-esophageal reflux disease without esophagitis  - dexlansoprazole (DEXILANT) 60 MG capsule; Take 1 capsule (60 mg total) by mouth daily.  Dispense: 90 capsule; Refill: 1  6. Migraine without aura and without status migrainosus, not intractable  No recent episodes  7. Obstructive sleep apnea syndrome  Wearing CPAP every night   8. Dyslipidemia  Reviewed last labs, LDL was above goal, HDL low, she cannot tolerate statin because of her chronic muscle aches from FMS  9.  Cognitive decline  She wants to stop Wellbutrin because of weight gain, cognitive decline and visual changes ( seen eye doctor in the past), explained may not be related. Offered to refer her to neurologist but she would like to hold off for now

## 2018-01-15 ENCOUNTER — Ambulatory Visit
Admission: RE | Admit: 2018-01-15 | Discharge: 2018-01-15 | Disposition: A | Discharge status: Home / Self Care | Payer: BLUE CROSS/BLUE SHIELD | Source: Ambulatory Visit | Attending: Family Medicine | Admitting: Family Medicine

## 2018-01-15 ENCOUNTER — Other Ambulatory Visit: Payer: Self-pay | Admitting: Family Medicine

## 2018-01-15 ENCOUNTER — Ambulatory Visit: Payer: Self-pay | Admitting: *Deleted

## 2018-01-15 DIAGNOSIS — R0781 Pleurodynia: Secondary | ICD-10-CM

## 2018-01-15 DIAGNOSIS — Z8781 Personal history of (healed) traumatic fracture: Secondary | ICD-10-CM | POA: Insufficient documentation

## 2018-01-15 NOTE — Telephone Encounter (Signed)
I will send order for rib views, nothing beside hydrocodone for pain. I am sorry

## 2018-01-15 NOTE — Telephone Encounter (Signed)
No answer

## 2018-01-15 NOTE — Telephone Encounter (Signed)
Deanna Baker phoned to report the right sided rib cage pain has not improved any since she saw Dr. Ancil Boozer at her last visit, 01/01/18. Reports the Dr. Renold Genta at the bruise just under the Rt. Breast which happened the day before her visit by leaning over a freezer to clean it. She denies any other symptoms related to the bruised area.  Describes the pain as nagging and constant. She has tried her hydrocodone 10-325 mg which did not help the discomfort very much. Rates the pain an 8 at this time with any activity. Stated she was using the heating pad on the area this morning.  stated she could not take ibuprofen. She is asking if there is another pain medication she can try that might help or if the Dr. Geraldine Solar her to have an xray? Please advise patient.

## 2018-01-18 ENCOUNTER — Encounter: Payer: Self-pay | Admitting: Family Medicine

## 2018-03-09 ENCOUNTER — Other Ambulatory Visit: Payer: Self-pay | Admitting: Family Medicine

## 2018-03-09 DIAGNOSIS — D509 Iron deficiency anemia, unspecified: Secondary | ICD-10-CM

## 2018-03-09 MED ORDER — IRON 240 (27 FE) MG PO TABS: 1.0000 | ORAL_TABLET | Freq: Every morning | ORAL | 0 refills | Status: DC

## 2018-03-09 NOTE — Addendum Note (Signed)
Addended by: Steele Sizer F on: 03/09/2018 05:10 PM   Modules accepted: Orders

## 2018-03-09 NOTE — Telephone Encounter (Signed)
I am sending ferrous sulfate refill, however she was supposed to have CBC and iron studies done . Please ask her to return this week for labs , I will forward results to Dr. Mike Gip

## 2018-03-10 ENCOUNTER — Other Ambulatory Visit: Payer: Self-pay

## 2018-03-10 DIAGNOSIS — D509 Iron deficiency anemia, unspecified: Secondary | ICD-10-CM

## 2018-03-10 LAB — CBC WITH DIFFERENTIAL/PLATELET
BASOS PCT: 0.3 %
Basophils Absolute: 19 cells/uL (ref 0–200)
EOS PCT: 2.8 %
Eosinophils Absolute: 174 cells/uL (ref 15–500)
HEMATOCRIT: 34.9 % — AB (ref 35.0–45.0)
HEMOGLOBIN: 11.9 g/dL (ref 11.7–15.5)
LYMPHS ABS: 1135 {cells}/uL (ref 850–3900)
MCH: 30.3 pg (ref 27.0–33.0)
MCHC: 34.1 g/dL (ref 32.0–36.0)
MCV: 88.8 fL (ref 80.0–100.0)
MPV: 10.5 fL (ref 7.5–12.5)
Monocytes Relative: 5.2 %
NEUTROS ABS: 4551 {cells}/uL (ref 1500–7800)
Neutrophils Relative %: 73.4 %
Platelets: 238 10*3/uL (ref 140–400)
RBC: 3.93 10*6/uL (ref 3.80–5.10)
RDW: 13.3 % (ref 11.0–15.0)
Total Lymphocyte: 18.3 %
WBC: 6.2 10*3/uL (ref 3.8–10.8)
WBCMIX: 322 {cells}/uL (ref 200–950)

## 2018-03-10 LAB — IRON,TIBC AND FERRITIN PANEL
%SAT: 26 % (calc) (ref 11–50)
FERRITIN: 64 ng/mL (ref 10–232)
Iron: 75 ug/dL (ref 45–160)
TIBC: 287 mcg/dL (calc) (ref 250–450)

## 2018-03-11 ENCOUNTER — Encounter: Payer: Self-pay | Admitting: Family Medicine

## 2018-03-26 ENCOUNTER — Other Ambulatory Visit: Payer: Self-pay | Admitting: *Deleted

## 2018-03-26 DIAGNOSIS — C50411 Malignant neoplasm of upper-outer quadrant of right female breast: Secondary | ICD-10-CM

## 2018-03-26 DIAGNOSIS — C50412 Malignant neoplasm of upper-outer quadrant of left female breast: Secondary | ICD-10-CM

## 2018-03-28 NOTE — Progress Notes (Signed)
[facility] day:  03/29/18  Chief Complaint: Deanna Baker is a 57 y.o. female with stage IA triple negative left breast cancer who is seen for 6 month assessment.  HPI:  The patient was last seen in the medical oncology clinic on 09/21/2017.  At that time, patient was doing well overall. She complained of waking up with "crampy abdominal pain" and nausea. She denies sick contacts. No breast concerns or B symptoms. Exam was stable. Hematocrit was 34.9. Ferritin was 34.  Patient seen in follow up consult on 10/30/2017 by Dr. Noreene Filbert (radiation oncology). She was 4 years out from treatment and doing well. Patient wished to discontinue follow up visits.   Routine mammogram done on 12/21/2017 revealing no mammographic evidence of malignancy in either breast.  CBC on 03/10/2018 revealed a WBC of 6200 ([place] 4551). Hemoglobin 11.9, hematocrit 34.9, MCV 88.8, and platelets 238,000. Iron saturation was 26% with a TIBC of 287. Ferritin was 64.  In the interim, patient notes that her entire body is hurting. She has a fibromyalgia diagnosis. She has no acute concerns. Patient denies B symptoms and interval infections.  Patient does not verbalize any concerns with regards to her breasts today. Patient performs monthly self breast examinations as recommended. Patient denies bleeding; no hematochezia, melena, or gross hematuria. Patient is post menopausal.   Patient is eating well. Weight has increased by 2 pounds. Patient complains of pain rated 7/10 in the clinic today.     Past Medical History:  Diagnosis Date  . Anemia   . Arthritis 1990  . Bronchitis 5/17  . Chronic fatigue syndrome   . Colon polyp 2013  . Fibromyalgia 1993  . GERD (gastroesophageal reflux disease)   . Heart murmur   . Hypertension 2012  . Lump or mass in breast   . Malignant neoplasm of upper-outer quadrant of female breast ([place])    Invasive mammary carcinoma, triple negative   . Measles   . Mumps 1973  . MVP (mitral valve prolapse)   . Obstructive sleep apnea on CPAP 11/27/15  . Personal history of radiation therapy    left  . Shortness of breath dyspnea    chronic fatigue/fibromyalgia    Past Surgical History:  Procedure Laterality Date  . BACK SURGERY    . BREAST BIOPSY Left 10/2012   +  . 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 BREAST LUMPECTOMY Left 12/06/12    lumpectomy with SN biopsy and power port placement...  . COLONOSCOPY  2013   Dr. Suzette Battiest   . COLONOSCOPY WITH PROPOFOL N/A 04/28/2016   Procedure: COLONOSCOPY WITH PROPOFOL;  Surgeon: Lucilla Lame, MD;  Location: [facility];  Service: Endoscopy;  Laterality: N/A;  CPAP  . ESOPHAGOGASTRODUODENOSCOPY (EGD) WITH PROPOFOL N/A 10/23/2015   Procedure: ESOPHAGOGASTRODUODENOSCOPY (EGD) WITH PROPOFOL;  Surgeon: Lucilla Lame, MD;  Location: [facility];  Service: Endoscopy;  Laterality: N/A;  . POLYPECTOMY  04/28/2016   Procedure: POLYPECTOMY;  Surgeon: Lucilla Lame, MD;  Location: [facility];  Service: Endoscopy;;  . SHOULDER SURGERY Left 2009  . TONSILLECTOMY  1965  . TUBAL LIGATION  1982  . UPPER GI ENDOSCOPY     Dr. Suzette Battiest    Family History  Problem Relation Age of Onset  . Breast cancer Mother 74  . Breast cancer Maternal Aunt   . Brain cancer Paternal Uncle        x 2 mat  uncles    Social History:  reports that she quit smoking about 37 years ago. Her smoking use included cigarettes. She has a 20.00 pack-year smoking history. She has never used smokeless tobacco. She reports that she drinks alcohol. She reports that she does not use drugs.  She has a 58 year old healthy daughter.  She lives in [place]. The patient is alone today.  Allergies:  Allergies  Allergen Reactions  . Ace Inhibitors Swelling  . Pregabalin     weight gain and blurred vision  . Venlafaxine     foggy minded    Current Medications: Current Outpatient Medications   Medication Sig Dispense Refill  . ALPRAZolam (XANAX) 0.5 MG tablet TAKE 1 TABLET BY MOUTH TWICE A DAY AS NEEDED FOR ANXIETY 30 tablet 0  . dexlansoprazole (DEXILANT) 60 MG capsule Take 1 capsule (60 mg total) by mouth daily. 90 capsule 1  . docusate sodium (COLACE) 100 MG capsule Take 100 mg by mouth 2 (two) times daily.    . Ferrous Gluconate (IRON) 240 (27 Fe) MG TABS Take 1 tablet by mouth every morning. 30 tablet 0  . gabapentin (NEURONTIN) 400 MG capsule TAKE 1 CAPSULE IN THE MORNING AND TAKE 2 CAPSULES IN THE EVENING 90 capsule 1  . HYDROcodone-acetaminophen (NORCO) 10-325 MG tablet Take 1 tablet by mouth every 8 (eight) hours as needed. 45 tablet 0  . Nerve Stimulator (STANDARD TENS) DEVI 1 Units by Does not apply route daily. 1 Device 0  . olmesartan-hydrochlorothiazide (BENICAR HCT) 40-25 MG tablet TAKE 1 TABLET BY MOUTH DAILY 90 tablet 1  . ondansetron (ZOFRAN) 4 MG tablet TAKE 1 TABLET EVERY 8 HOURS AS NEEDED FOR NAUSEA AND VOMITING 20 tablet 0  . PARoxetine (PAXIL) 40 MG tablet Take 1 tablet (40 mg total) by mouth every morning. 90 tablet 1   No current facility-administered medications for this visit.     Review of Systems  Constitutional: Positive for malaise/fatigue (chronic). Negative for diaphoresis, fever and weight loss (weight up 2 pounds).  HENT: Negative.   Eyes: Negative.   Respiratory: Negative for cough, hemoptysis, sputum production and shortness of breath.        OSAH syndrome; on nocturnal PAP therapy  Cardiovascular: Negative for chest pain, palpitations, orthopnea, leg swelling and PND.  Gastrointestinal: Positive for heartburn (on Dexilant). Negative for abdominal pain, blood in stool, constipation, diarrhea, melena, nausea and vomiting.  Genitourinary: Negative for dysuria, frequency, hematuria and urgency.  Musculoskeletal: Positive for myalgias (Fibromyalgia on gabapentin). Negative for back pain, falls and joint pain.  Skin: Negative for itching and rash.   Neurological: Negative for dizziness, tremors, weakness and headaches.  Endo/Heme/Allergies: Does not bruise/bleed easily.  Psychiatric/Behavioral: Positive for memory loss. Negative for depression and suicidal ideas. The patient is not nervous/anxious and does not have insomnia.   All other systems reviewed and are negative.  Physical Exam: Blood pressure 105/70, pulse 61, temperature 97.6 F (36.4 C), temperature source Tympanic, weight 194 lb 6 oz (88.2 kg). GENERAL:  Well developed, well nourished, woman sitting comfortably in the exam room in no acute distress. MENTAL STATUS:  Alert and oriented to person, place and time. HEAD:  Brown hair.  Normocephalic, atraumatic, face symmetric, no Cushingoid features. EYES:  Blue eyes.  Pupils equal round and reactive to light and accomodation.  No conjunctivitis or scleral icterus. ENT:  Oropharynx clear without lesion.  Tongue normal. Mucous membranes moist.  RESPIRATORY:  Clear to auscultation without rales, wheezes or rhonchi. CARDIOVASCULAR:  Regular rate  and rhythm without murmur, rub or gallop. BREAST:  Right breast without masses, skin changes or nipple discharge.  Left breast s/p lumpectomy with mild radiation changes and edema inferiorly.  No masses, skin changes or nipple discharge. ABDOMEN:  Soft, non-tender, with active bowel sounds, and no hepatosplenomegaly.  No masses. SKIN:  No rashes, ulcers or lesions. EXTREMITIES: No edema, no skin discoloration or tenderness.  No palpable cords. LYMPH NODES: No palpable cervical, supraclavicular, axillary or inguinal adenopathy  NEUROLOGICAL: Unremarkable. PSYCH:  Appropriate.    Appointment on 03/29/2018  Component Date Value Ref Range Status  . Sodium 03/29/2018 136  135 - 145 mmol/L Final  . Potassium 03/29/2018 3.8  3.5 - 5.1 mmol/L Final  . Chloride 03/29/2018 101  101 - 111 mmol/L Final  . CO2 03/29/2018 26  22 - 32 mmol/L Final  . Glucose, Bld 03/29/2018 133* 65 - 99 mg/dL Final   . BUN 03/29/2018 19  6 - 20 mg/dL Final  . Creatinine, Ser 03/29/2018 0.88  0.44 - 1.00 mg/dL Final  . Calcium 03/29/2018 9.3  8.9 - 10.3 mg/dL Final  . Total Protein 03/29/2018 6.8  6.5 - 8.1 g/dL Final  . Albumin 03/29/2018 3.8  3.5 - 5.0 g/dL Final  . AST 03/29/2018 26  15 - 41 U/L Final  . ALT 03/29/2018 21  14 - 54 U/L Final  . Alkaline Phosphatase 03/29/2018 95  38 - 126 U/L Final  . Total Bilirubin 03/29/2018 0.7  0.3 - 1.2 mg/dL Final  . GFR calc non Af Amer 03/29/2018 >60  >60 mL/min Final  . GFR calc Af Amer 03/29/2018 >60  >60 mL/min Final   Comment: (NOTE) The eGFR has been calculated using the CKD EPI equation. This calculation has not been validated in all clinical situations. eGFR's persistently <60 mL/min signify possible Chronic Kidney Disease.   Georgiann Hahn gap 03/29/2018 9  5 - 15 Final   Performed at [facility], [address] [place], [place] [address]  . WBC 03/29/2018 5.2  3.6 - 11.0 K/uL Final  . RBC 03/29/2018 3.78* 3.80 - 5.20 MIL/uL Final  . Hemoglobin 03/29/2018 11.7* 12.0 - 16.0 g/dL Final  . HCT 03/29/2018 33.8* 35.0 - 47.0 % Final  . MCV 03/29/2018 89.3  80.0 - 100.0 fL Final  . MCH 03/29/2018 30.9  26.0 - 34.0 pg Final  . MCHC 03/29/2018 34.6  32.0 - 36.0 g/dL Final  . RDW 03/29/2018 14.3  11.5 - 14.5 % Final  . Platelets 03/29/2018 222  150 - 440 K/uL Final  . Neutrophils Relative % 03/29/2018 70  % Final  . Neutro Abs 03/29/2018 3.6  1.4 - 6.5 K/uL Final  . Lymphocytes Relative 03/29/2018 21  % Final  . Lymphs Abs 03/29/2018 1.1  1.0 - 3.6 K/uL Final  . Monocytes Relative 03/29/2018 6  % Final  . Monocytes Absolute 03/29/2018 0.3  0.2 - 0.9 K/uL Final  . Eosinophils Relative 03/29/2018 3  % Final  . Eosinophils Absolute 03/29/2018 0.1  0 - 0.7 K/uL Final  . Basophils Relative 03/29/2018 0  % Final  . Basophils Absolute 03/29/2018 0.0  0 - 0.1 K/uL Final   Performed at [facility], [address], [place], [place] [address]     Assessment:  Deanna Baker is a 57 y.o. female with stage IA triple negative left breast cancer s/p lumpectomy and sentinel lymph node biopsy on 12/06/2012.  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  Tumor was ER, PR and HER-2/neu negative. Sentinel left nodes were negative.  Pathologic stage was [id number].    BRCA mutation was negative in 11/2012.  She has a family history of breast cancer (mother at age 55 and maternal aunt).  She received Adriamycin and Cytoxan (AC) 4 from 12/17/2011 - 02/17/2013. She received weekly Taxol 12 from 03/10/2013 - 06/02/2013.  She received radiation.    CA27.29 has been monitored:  33.9 on 06/02/2013, 26.1 on 02/14/2014, 23.8 on 08/22/2014, 25.1 on 09/18/2016, 23.4 on 03/19/2017, 21.1 on 09/21/2017, and 19.9 on 03/29/2018.  Bilateral diagnostic mammogram on 12/14/2015 revealed expected density and architectural distortion with dystrophic calcifications in the upper outer quadrant of the left breast stable compared to prior studies. There was left breast skin thickening, stable with post radiation change. There are no suspicious masses, malignant calcifications, sites of nonsurgical architectural distortions or assymmetries in either breast.  Bilateral diagnostic mammogram on 12/17/2016 revealed no evidence of malignancy. Bilateral diagnostic mammogram on 12/21/2017 revealing no mammographic evidence of malignancy in either breast.  She has a history of iron deficiency anemia.  She is on ferrous sulfate 325 mg a day.  Diet is good.  She denies any melena or hematochezia.  Hematocrit has improved from 30.4 on 03/18/2016 to 35.6 on 09/18/2016.   Ferritin was 39 on 09/18/2016.  Colonoscopy on 04/28/2016 revealed a 3 mm and 7 mm polyp in the transverse colon.  There was a 2 mm polyp in the rectum.  Repeat colonoscopy was recommended in 5 years for surveillance.  EGD on 10/23/2015 revealed benign-appearing esophageal stenosis  which was dilated.  She had LA Grade B reflux esophagitis.  Stomach and duodenum was normal.    She has fibromyalgia and is on gabapentin 400 mg twice a day.  Symptomatically, she denies any breast concerns.  She complains of pain "all over" secondary to fibromyalgia. Exam is stable.  She has mild anemia (new).  Hemoglobin 11.7, hematocrit 33.8, MCV 89.3, and platelets 222,000.  Plan: 1.  Labs today:  CBC with diff, CMP, CA27.29. 2.  Discuss anemia work-up:  ferritin, iron studies, B12, folate, TSH, retic, UA. 3.  Review mammogram- no evidence of malignancy.  4.  RTC in 6 months for MD assessment and labs (CBC with diff, CMP, CA27.29).   Honor Loh, NP  03/29/18, 12:10 PM   I saw and evaluated the patient, participating in the key portions of the service and reviewing pertinent diagnostic studies and records.  I reviewed the nurse practitioner's note and agree with the findings and the plan.  The assessment and plan were discussed with the patient.  Several questions were asked by the patient and answered.   Nolon Stalls, MD 03/29/2018,12:10 PM

## 2018-03-29 ENCOUNTER — Inpatient Hospital Stay (HOSPITAL_BASED_OUTPATIENT_CLINIC_OR_DEPARTMENT_OTHER): Payer: BLUE CROSS/BLUE SHIELD | Admitting: Hematology and Oncology

## 2018-03-29 ENCOUNTER — Inpatient Hospital Stay: Payer: BLUE CROSS/BLUE SHIELD | Attending: Hematology and Oncology

## 2018-03-29 ENCOUNTER — Other Ambulatory Visit: Payer: Self-pay

## 2018-03-29 VITALS — BP 105/70 | HR 61 | Temp 97.6°F | Wt 194.4 lb

## 2018-03-29 DIAGNOSIS — Z803 Family history of malignant neoplasm of breast: Secondary | ICD-10-CM | POA: Insufficient documentation

## 2018-03-29 DIAGNOSIS — D649 Anemia, unspecified: Secondary | ICD-10-CM

## 2018-03-29 DIAGNOSIS — C50412 Malignant neoplasm of upper-outer quadrant of left female breast: Secondary | ICD-10-CM

## 2018-03-29 DIAGNOSIS — Z87891 Personal history of nicotine dependence: Secondary | ICD-10-CM

## 2018-03-29 DIAGNOSIS — Z7289 Other problems related to lifestyle: Secondary | ICD-10-CM | POA: Diagnosis not present

## 2018-03-29 DIAGNOSIS — C50411 Malignant neoplasm of upper-outer quadrant of right female breast: Secondary | ICD-10-CM

## 2018-03-29 DIAGNOSIS — Z853 Personal history of malignant neoplasm of breast: Secondary | ICD-10-CM

## 2018-03-29 DIAGNOSIS — M797 Fibromyalgia: Secondary | ICD-10-CM

## 2018-03-29 DIAGNOSIS — Z9221 Personal history of antineoplastic chemotherapy: Secondary | ICD-10-CM

## 2018-03-29 DIAGNOSIS — D509 Iron deficiency anemia, unspecified: Secondary | ICD-10-CM | POA: Diagnosis not present

## 2018-03-29 DIAGNOSIS — E538 Deficiency of other specified B group vitamins: Secondary | ICD-10-CM | POA: Insufficient documentation

## 2018-03-29 DIAGNOSIS — Z923 Personal history of irradiation: Secondary | ICD-10-CM | POA: Insufficient documentation

## 2018-03-29 DIAGNOSIS — Z171 Estrogen receptor negative status [ER-]: Principal | ICD-10-CM

## 2018-03-29 LAB — TSH: TSH: 3.24 u[IU]/mL (ref 0.350–4.500)

## 2018-03-29 LAB — URINALYSIS, COMPLETE (UACMP) WITH MICROSCOPIC
Bilirubin Urine: NEGATIVE
Glucose, UA: NEGATIVE mg/dL
Hgb urine dipstick: NEGATIVE
Ketones, ur: NEGATIVE mg/dL
Leukocytes, UA: NEGATIVE
Nitrite: NEGATIVE
Protein, ur: NEGATIVE mg/dL
Specific Gravity, Urine: 1.011 (ref 1.005–1.030)
pH: 7 (ref 5.0–8.0)

## 2018-03-29 LAB — COMPREHENSIVE METABOLIC PANEL
ALT: 21 U/L (ref 14–54)
AST: 26 U/L (ref 15–41)
Albumin: 3.8 g/dL (ref 3.5–5.0)
Alkaline Phosphatase: 95 U/L (ref 38–126)
Anion gap: 9 (ref 5–15)
BUN: 19 mg/dL (ref 6–20)
CO2: 26 mmol/L (ref 22–32)
Calcium: 9.3 mg/dL (ref 8.9–10.3)
Chloride: 101 mmol/L (ref 101–111)
Creatinine, Ser: 0.88 mg/dL (ref 0.44–1.00)
GFR calc Af Amer: >60 mL/min (ref >60)
GFR calc non Af Amer: >60 mL/min (ref >60)
Glucose, Bld: 133 mg/dL — ABNORMAL HIGH (ref 65–99)
Potassium: 3.8 mmol/L (ref 3.5–5.1)
Sodium: 136 mmol/L (ref 135–145)
Total Bilirubin: 0.7 mg/dL (ref 0.3–1.2)
Total Protein: 6.8 g/dL (ref 6.5–8.1)

## 2018-03-29 LAB — CBC WITH DIFFERENTIAL/PLATELET
Basophils Absolute: 0 10*3/uL (ref 0–0.1)
Basophils Relative: 0 %
Eosinophils Absolute: 0.1 10*3/uL (ref 0–0.7)
Eosinophils Relative: 3 %
HCT: 33.8 % — ABNORMAL LOW (ref 35.0–47.0)
Hemoglobin: 11.7 g/dL — ABNORMAL LOW (ref 12.0–16.0)
Lymphocytes Relative: 21 %
Lymphs Abs: 1.1 10*3/uL (ref 1.0–3.6)
MCH: 30.9 pg (ref 26.0–34.0)
MCHC: 34.6 g/dL (ref 32.0–36.0)
MCV: 89.3 fL (ref 80.0–100.0)
Monocytes Absolute: 0.3 10*3/uL (ref 0.2–0.9)
Monocytes Relative: 6 %
Neutro Abs: 3.6 10*3/uL (ref 1.4–6.5)
Neutrophils Relative %: 70 %
Platelets: 222 10*3/uL (ref 150–440)
RBC: 3.78 MIL/uL — ABNORMAL LOW (ref 3.80–5.20)
RDW: 14.3 % (ref 11.5–14.5)
WBC: 5.2 10*3/uL (ref 3.6–11.0)

## 2018-03-29 LAB — RETICULOCYTES
RBC.: 3.93 MIL/uL (ref 3.80–5.20)
RETIC CT PCT: 2.2 % (ref 0.4–3.1)
Retic Count, Absolute: 86.5 10*3/uL (ref 19.0–183.0)

## 2018-03-29 LAB — VITAMIN B12: VITAMIN B 12: 209 pg/mL (ref 180–914)

## 2018-03-29 LAB — FERRITIN: Ferritin: 51 ng/mL (ref 11–307)

## 2018-03-29 LAB — IRON AND TIBC
IRON: 52 ug/dL (ref 28–170)
Saturation Ratios: 16 % (ref 10.4–31.8)
TIBC: 323 ug/dL (ref 250–450)
UIBC: 271 ug/dL

## 2018-03-29 LAB — FOLATE: Folate: 14.1 ng/mL (ref >5.9)

## 2018-03-29 NOTE — Progress Notes (Signed)
Patient states she is always fatigued.  She states she sweats profusely.  She states she went through menopause in her 46's.  She has generalized pain 7/10 from fibromyalgia.   No new problems with regards to breast cancer.

## 2018-03-30 LAB — CANCER ANTIGEN 27.29: CA 27.29: 19.9 U/mL (ref 0.0–38.6)

## 2018-03-30 NOTE — Progress Notes (Signed)
B12 low at 209. Will start on IM supplementation. Patient to be scheduled for weekly injections x 6, then monthly. Note sent to scheduling.

## 2018-04-01 ENCOUNTER — Telehealth: Payer: Self-pay | Admitting: *Deleted

## 2018-04-01 ENCOUNTER — Telehealth: Payer: Self-pay | Admitting: Family Medicine

## 2018-04-01 NOTE — Telephone Encounter (Signed)
Called patient back to inform her that Dr. Mike Gip is okay with her getting her B-12 injections at PCP office.  She will need them weekly X 6 and then monthly.  Patient has appointment on Monday and will speak with PCP at that time about taking over her B-12.

## 2018-04-01 NOTE — Telephone Encounter (Signed)
-----   Message from Wilburn Cornelia sent at 04/01/2018  1:37 PM EDT ----- Regarding: B12 inj Contact: [phone] Pt called to see if she can start getting her B12 inj at her PCP-Dr Ancil Boozer?  She has an appt Monday with [place] for Inj and her PCP as well.  Please call pt and advise her either way.  thx!

## 2018-04-01 NOTE — Telephone Encounter (Signed)
Pt informed and already have appt here on 04-05-18 therefore she will get the shot then.

## 2018-04-01 NOTE — Telephone Encounter (Signed)
Copied from [place] [id number]. Topic: Quick Communication - See Telephone Encounter >> Apr 01, 2018  2:20 PM Deanna Baker, NT wrote: CRM for notification. See Telephone encounter for: 04/01/18. Pt calling and states that her cancer doctor, Dr. Mike Gip is prescribing this pt to have B12 shots. Dr. Mike Gip advised pt to see if Dr. Ancil Boozer would [name] these injections in order to save the patient money. She would need 1 injection for 6 weeks. To begin on Monday 04/05/18. Then once a month until November. Please contact pt to let her know if these can be scheduled with this office.

## 2018-04-01 NOTE — Telephone Encounter (Signed)
Okay to give it

## 2018-04-04 ENCOUNTER — Encounter: Payer: Self-pay | Admitting: Hematology and Oncology

## 2018-04-04 DIAGNOSIS — D649 Anemia, unspecified: Secondary | ICD-10-CM | POA: Insufficient documentation

## 2018-04-05 ENCOUNTER — Inpatient Hospital Stay: Payer: BLUE CROSS/BLUE SHIELD

## 2018-04-05 ENCOUNTER — Ambulatory Visit: Payer: BLUE CROSS/BLUE SHIELD | Admitting: Family Medicine

## 2018-04-05 ENCOUNTER — Encounter: Payer: Self-pay | Admitting: Family Medicine

## 2018-04-05 VITALS — BP 116/64 | HR 76 | Temp 97.7°F | Resp 16 | Ht 61.0 in | Wt 196.4 lb

## 2018-04-05 DIAGNOSIS — D519 Vitamin B12 deficiency anemia, unspecified: Secondary | ICD-10-CM | POA: Diagnosis not present

## 2018-04-05 DIAGNOSIS — I10 Essential (primary) hypertension: Secondary | ICD-10-CM

## 2018-04-05 DIAGNOSIS — F339 Major depressive disorder, recurrent, unspecified: Secondary | ICD-10-CM | POA: Diagnosis not present

## 2018-04-05 DIAGNOSIS — G4733 Obstructive sleep apnea (adult) (pediatric): Secondary | ICD-10-CM

## 2018-04-05 DIAGNOSIS — K219 Gastro-esophageal reflux disease without esophagitis: Secondary | ICD-10-CM | POA: Diagnosis not present

## 2018-04-05 DIAGNOSIS — G9332 Myalgic encephalomyelitis/chronic fatigue syndrome: Secondary | ICD-10-CM

## 2018-04-05 DIAGNOSIS — D8989 Other specified disorders involving the immune mechanism, not elsewhere classified: Secondary | ICD-10-CM

## 2018-04-05 DIAGNOSIS — G43009 Migraine without aura, not intractable, without status migrainosus: Secondary | ICD-10-CM

## 2018-04-05 DIAGNOSIS — E538 Deficiency of other specified B group vitamins: Secondary | ICD-10-CM

## 2018-04-05 DIAGNOSIS — M797 Fibromyalgia: Secondary | ICD-10-CM | POA: Diagnosis not present

## 2018-04-05 DIAGNOSIS — R739 Hyperglycemia, unspecified: Secondary | ICD-10-CM

## 2018-04-05 DIAGNOSIS — R5382 Chronic fatigue, unspecified: Secondary | ICD-10-CM

## 2018-04-05 DIAGNOSIS — E785 Hyperlipidemia, unspecified: Secondary | ICD-10-CM

## 2018-04-05 DIAGNOSIS — D509 Iron deficiency anemia, unspecified: Secondary | ICD-10-CM

## 2018-04-05 MED ORDER — OLMESARTAN MEDOXOMIL-HCTZ 40-25 MG PO TABS: 1.0000 | ORAL_TABLET | Freq: Every day | ORAL | 1 refills | Status: DC

## 2018-04-05 MED ORDER — HYDROCODONE-ACETAMINOPHEN 10-325 MG PO TABS: 1.0000 | ORAL_TABLET | Freq: Three times a day (TID) | ORAL | 0 refills | Status: DC | PRN

## 2018-04-05 MED ORDER — CYANOCOBALAMIN 1000 MCG/ML IJ SOLN
1000.0000 ug | Freq: Once | INTRAMUSCULAR | Status: AC
  Administered 2018-04-05: 1000 ug via INTRAMUSCULAR

## 2018-04-05 MED ORDER — GABAPENTIN 400 MG PO CAPS: 400.0000 mg | ORAL_CAPSULE | Freq: Two times a day (BID) | ORAL | 1 refills | Status: DC

## 2018-04-05 NOTE — Progress Notes (Signed)
Name: Deanna Baker   MRN: [id number]    DOB: [birth year]   Date:04/05/2018       Progress Note  Subjective  Chief Complaint  Chief Complaint  Patient presents with  . B12 Injection    B-12 defiency  . Depression  . Hypertension    HPI   HTN: she has been taking medication and no chest pain, no dizziness palpitation , orthopnea or SOB. No side effects of medication, sheis onBenicar HCTZ.  FMS: she continues to have daily pain, taking pain medications :gabapentin, and antidepressant, prn Hydrocodone ( Tramadol did not work for her)and sometimes with Zofran to control nausea. She has daily mental fogginess, trigger point painsand generalized body aches. Current pain is 5/10- unchanged from last visitAverage pain 7/10. She states more energy in the late afternoon   CFIDS: still feels tired all the time, but symptoms waxes and wanes in severity. Post-activity fatigue is severe. Off stimulants, but does not want to go back on medication She needs to nap most days - usually 2 hours. She has rebound fatigue after activity   Reflux esophagitis: choking and also esophageal thickenes on CT chest/abdomen. Seen by Dr. Durwin Reges she had an EGD and esophageal dilation, and was diagnosed with reflux esophagitis and is still on Dexilant, She states that symptoms are well controlled with medication, if she skips medication she has regurgitation at night and burning sensation, she states concerned about B12 deficiency and will try taking medication every other day   Major Depression: she was  taking Paxil and Wellbutrin, she has been off Wellbutrin March 2019 and states depression is still under control and does not to change medications at this tme  ASCUS: discussed negative HPV , and repeat pap last year was normal, recheck yearly for a couple more years before going to every 3 years after that   OSA: she is wearing CPAP every night, all night. She tolerated mask well and it helps her with  energy level in morning. Unchanged  Migraine headaches: no recent episodes  Dyslipidemia: she refuses statin therapy, last lipid panel reviewed with patient and it did not look good, discussed life style modification, including eating more fish and tree nuts and try to exercise more often - as tolerated. She states intolerant to statin therapy    Patient Active Problem List   Diagnosis Date Noted  . Anemia 04/04/2018  . B12 deficiency 03/29/2018  . Cognitive decline 01/02/2017  . Chronic kidney disease, stage 3 ([place]) 09/23/2016  . Sleep apnea 07/11/2016  . ASCUS favor benign 07/10/2016  . Memory loss 05/12/2016  . Smell or taste sensation disturbance 05/12/2016  . Personal history of colonic polyps   . Benign neoplasm of transverse colon   . Rectal polyp   . Tenosynovitis of thumb 03/12/2016  . Bilateral knee pain 03/12/2016  . Reflux esophagitis   . Stricture and stenosis of esophagus   . Abnormal findings-gastrointestinal tract   . Thickening of esophagus 08/03/2015  . Choking 07/04/2015  . Benign essential HTN 05/01/2015  . CFIDS (chronic fatigue and immune dysfunction syndrome) ([place]) 05/01/2015  . Major depression, recurrent, chronic ([place]) 05/01/2015  . Dyslipidemia 05/01/2015  . Fibromyalgia syndrome 05/01/2015  . Blood glucose elevated 05/01/2015  . Eczema intertrigo 05/01/2015  . Iron deficiency anemia due to chronic blood loss 05/01/2015  . Migraine without aura and without status migrainosus, not intractable 05/01/2015  . Excessive urination at night 05/01/2015  . Dysmetabolic syndrome 15/83/0940  . History of colonoscopy  with polypectomy 05/01/2015  . Central sleep apnea 05/01/2015  . History of back surgery 05/01/2015  . Thickened nails 05/01/2015  . Malignant neoplasm of upper-outer quadrant of female breast, triple negative 06/13/2013    Past Surgical History:  Procedure Laterality Date  . BACK SURGERY    . BREAST BIOPSY Left 10/2012   +  . BREAST  EXCISIONAL BIOPSY Left 11/2012   + triple neg breast ca  . BREAST LUMPECTOMY Left 12/06/12    lumpectomy with SN biopsy and power port placement...  . COLONOSCOPY  2013   Dr. Suzette Battiest   . COLONOSCOPY WITH PROPOFOL N/A 04/28/2016   Procedure: COLONOSCOPY WITH PROPOFOL;  Surgeon: Lucilla Lame, MD;  Location: [facility];  Service: Endoscopy;  Laterality: N/A;  CPAP  . ESOPHAGOGASTRODUODENOSCOPY (EGD) WITH PROPOFOL N/A 10/23/2015   Procedure: ESOPHAGOGASTRODUODENOSCOPY (EGD) WITH PROPOFOL;  Surgeon: Lucilla Lame, MD;  Location: [facility];  Service: Endoscopy;  Laterality: N/A;  . POLYPECTOMY  04/28/2016   Procedure: POLYPECTOMY;  Surgeon: Lucilla Lame, MD;  Location: [facility];  Service: Endoscopy;;  . SHOULDER SURGERY Left 2009  . TONSILLECTOMY  1965  . TUBAL LIGATION  1982  . UPPER GI ENDOSCOPY     Dr. Suzette Battiest    Family History  Problem Relation Age of Onset  . Breast cancer Mother 55  . Breast cancer Maternal Aunt   . Brain cancer Paternal Uncle        x 2 mat uncles    Social History   Socioeconomic History  . Marital status: Married    Spouse name: Not on file  . Number of children: Not on file  . Years of education: Not on file  . Highest education level: Not on file  Occupational History  . Not on file  Social Needs  . Financial resource strain: Not on file  . Food insecurity:    Worry: Not on file    Inability: Not on file  . Transportation needs:    Medical: Not on file    Non-medical: Not on file  Tobacco Use  . Smoking status: Former Smoker    Packs/day: 1.00    Years: 20.00    Pack years: 20.00    Types: Cigarettes    Last attempt to quit: 11/24/1980    Years since quitting: 37.3  . Smokeless tobacco: Never Used  Substance and Sexual Activity  . Alcohol use: Yes    Alcohol/week: 0.0 oz    Comment: rarely, 1-2x/mo  . Drug use: No  . Sexual activity: Yes    Partners: Male  Lifestyle  . Physical activity:    Days per week: Not on  file    Minutes per session: Not on file  . Stress: Not on file  Relationships  . Social connections:    Talks on phone: Not on file    Gets together: Not on file    Attends religious service: Not on file    Active member of club or organization: Not on file    Attends meetings of clubs or organizations: Not on file    Relationship status: Not on file  . Intimate partner violence:    Fear of current or ex partner: Not on file    Emotionally abused: Not on file    Physically abused: Not on file    Forced sexual activity: Not on file  Other Topics Concern  . Not on file  Social History Narrative  . Not on file  Current Outpatient Medications:  .  ALPRAZolam (XANAX) 0.5 MG tablet, TAKE 1 TABLET BY MOUTH TWICE A DAY AS NEEDED FOR ANXIETY, Disp: 30 tablet, Rfl: 0 .  dexlansoprazole (DEXILANT) 60 MG capsule, Take 1 capsule (60 mg total) by mouth daily., Disp: 90 capsule, Rfl: 1 .  docusate sodium (COLACE) 100 MG capsule, Take 100 mg by mouth 2 (two) times daily., Disp: , Rfl:  .  Ferrous Gluconate (IRON) 240 (27 Fe) MG TABS, Take 1 tablet by mouth every morning., Disp: 30 tablet, Rfl: 0 .  gabapentin (NEURONTIN) 400 MG capsule, Take 1 capsule (400 mg total) by mouth 2 (two) times daily., Disp: 180 capsule, Rfl: 1 .  HYDROcodone-acetaminophen (NORCO) 10-325 MG tablet, Take 1 tablet by mouth every 8 (eight) hours as needed for severe pain., Disp: 45 tablet, Rfl: 0 .  Nerve Stimulator (STANDARD TENS) DEVI, 1 Units by Does not apply route daily., Disp: 1 Device, Rfl: 0 .  olmesartan-hydrochlorothiazide (BENICAR HCT) 40-25 MG tablet, Take 1 tablet by mouth daily., Disp: 90 tablet, Rfl: 1 .  ondansetron (ZOFRAN) 4 MG tablet, TAKE 1 TABLET EVERY 8 HOURS AS NEEDED FOR NAUSEA AND VOMITING, Disp: 20 tablet, Rfl: 0 .  PARoxetine (PAXIL) 40 MG tablet, Take 1 tablet (40 mg total) by mouth every morning., Disp: 90 tablet, Rfl: 1  Allergies  Allergen Reactions  . Ace Inhibitors Swelling  .  Pregabalin     weight gain and blurred vision  . Venlafaxine     foggy minded     ROS  Constitutional: Negative for fever or weight change.  Respiratory: Negative for cough and shortness of breath.   Cardiovascular: Negative for chest pain or palpitations.  Gastrointestinal: Negative for abdominal pain, no bowel changes.  Musculoskeletal: Negative for gait problem or joint swelling.  Skin: Negative for rash.  Neurological: Negative for dizziness or headache.  No other specific complaints in a complete review of systems (except as listed in HPI above).  Objective  Vitals:   04/05/18 1050  BP: 116/64  Pulse: 76  Resp: 16  Temp: 97.7 F (36.5 C)  TempSrc: Oral  SpO2: 95%  Weight: 196 lb 6.4 oz (89.1 kg)  Height: '5\' 1"'$  (1.549 m)    Body mass index is 37.11 kg/m.  Physical Exam  Constitutional: Patient appears well-developed and well-nourished. Obese No distress.  HEENT: head atraumatic, normocephalic, pupils equal and reactive to light,neck supple, throat within normal limits Cardiovascular: Normal rate, regular rhythm and normal heart sounds.  No murmur heard. No BLE edema. Pulmonary/Chest: Effort normal and breath sounds normal. No respiratory distress. Abdominal: Soft.  There is no tenderness. Muscular Skeletal: trigger point positive  Psychiatric: Patient has a normal mood and affect. behavior is normal. Judgment and thought content normal.  Recent Results (from the past 2160 hour(s))  Iron, TIBC and Ferritin Panel     Status: None   Collection Time: 03/10/18 11:24 AM  Result Value Ref Range   Iron 75 45 - 160 mcg/dL   TIBC 287 250 - 450 mcg/dL (calc)   %SAT 26 11 - 50 % (calc)   Ferritin 64 10 - 232 ng/mL  CBC with Differential/Platelet     Status: Abnormal   Collection Time: 03/10/18 11:24 AM  Result Value Ref Range   WBC 6.2 3.8 - 10.8 Thousand/uL   RBC 3.93 3.80 - 5.10 Million/uL   Hemoglobin 11.9 11.7 - 15.5 g/dL   HCT 34.9 (L) 35.0 - 45.0 %   MCV  88.8 80.0 -  100.0 fL   MCH 30.3 27.0 - 33.0 pg   MCHC 34.1 32.0 - 36.0 g/dL   RDW 13.3 11.0 - 15.0 %   Platelets 238 140 - 400 Thousand/uL   MPV 10.5 7.5 - 12.5 fL   Neutro Abs 4,551 1,500 - 7,800 cells/uL   Lymphs Abs 1,135 850 - 3,900 cells/uL   WBC mixed population 322 200 - 950 cells/uL   Eosinophils Absolute 174 15 - 500 cells/uL   Basophils Absolute 19 0 - 200 cells/uL   Neutrophils Relative % 73.4 %   Total Lymphocyte 18.3 %   Monocytes Relative 5.2 %   Eosinophils Relative 2.8 %   Basophils Relative 0.3 %  Cancer antigen 27.29     Status: None   Collection Time: 03/29/18 11:37 AM  Result Value Ref Range   CA 27.29 19.9 0.0 - 38.6 U/mL    Comment: (NOTE) Siemens Centaur Immunochemiluminometric Methodology (ICMA) Values obtained with different assay methods or kits cannot be used interchangeably. Results cannot be interpreted as absolute evidence of the presence or absence of malignant disease. Performed At: [facility] [place], [place] [id number] Rush Farmer MD [id number] Performed at [facility], [address] [place], [place] [address]   Comprehensive metabolic panel     Status: Abnormal   Collection Time: 03/29/18 11:37 AM  Result Value Ref Range   Sodium 136 135 - 145 mmol/L   Potassium 3.8 3.5 - 5.1 mmol/L   Chloride 101 101 - 111 mmol/L   CO2 26 22 - 32 mmol/L   Glucose, Bld 133 (H) 65 - 99 mg/dL   BUN 19 6 - 20 mg/dL   Creatinine, Ser 0.88 0.44 - 1.00 mg/dL   Calcium 9.3 8.9 - 10.3 mg/dL   Total Protein 6.8 6.5 - 8.1 g/dL   Albumin 3.8 3.5 - 5.0 g/dL   AST 26 15 - 41 U/L   ALT 21 14 - 54 U/L   Alkaline Phosphatase 95 38 - 126 U/L   Total Bilirubin 0.7 0.3 - 1.2 mg/dL   GFR calc non Af Amer >60 >60 mL/min   GFR calc Af Amer >60 >60 mL/min    Comment: (NOTE) The eGFR has been calculated using the CKD EPI equation. This calculation has not been validated in all clinical situations. eGFR's persistently <60 mL/min  signify possible Chronic Kidney Disease.    Anion gap 9 5 - 15    Comment: Performed at [facility], [address] [place], [place] [address]  CBC with Differential     Status: Abnormal   Collection Time: 03/29/18 11:37 AM  Result Value Ref Range   WBC 5.2 3.6 - 11.0 K/uL   RBC 3.78 (L) 3.80 - 5.20 MIL/uL   Hemoglobin 11.7 (L) 12.0 - 16.0 g/dL   HCT 33.8 (L) 35.0 - 47.0 %   MCV 89.3 80.0 - 100.0 fL   MCH 30.9 26.0 - 34.0 pg   MCHC 34.6 32.0 - 36.0 g/dL   RDW 14.3 11.5 - 14.5 %   Platelets 222 150 - 440 K/uL   Neutrophils Relative % 70 %   Neutro Abs 3.6 1.4 - 6.5 K/uL   Lymphocytes Relative 21 %   Lymphs Abs 1.1 1.0 - 3.6 K/uL   Monocytes Relative 6 %   Monocytes Absolute 0.3 0.2 - 0.9 K/uL   Eosinophils Relative 3 %   Eosinophils Absolute 0.1 0 - 0.7 K/uL   Basophils Relative 0 %  Basophils Absolute 0.0 0 - 0.1 K/uL    Comment: Performed at [facility], [address] [place], [place] [address]  TSH     Status: None   Collection Time: 03/29/18 12:31 PM  Result Value Ref Range   TSH 3.240 0.350 - 4.500 uIU/mL    Comment: Performed by a 3rd Generation assay with a functional sensitivity of <=0.01 uIU/mL. Performed at [facility], [address] [place], [place] [address]   Folate     Status: None   Collection Time: 03/29/18 12:31 PM  Result Value Ref Range   Folate 14.1 >5.9 ng/mL    Comment: Performed at [facility], [address] [place], [place] [address]  Vitamin B12     Status: None   Collection Time: 03/29/18 12:31 PM  Result Value Ref Range   Vitamin B-12 209 180 - 914 pg/mL    Comment: (NOTE) This assay is not validated for testing neonatal or myeloproliferative syndrome specimens for Vitamin B12 levels. Performed at [facility], [place] [address], [place], [place] [address]   Reticulocytes     Status: None   Collection Time: 03/29/18 12:31 PM  Result Value Ref Range   Retic Ct Pct 2.2 0.4 - 3.1 %   RBC.  3.93 3.80 - 5.20 MIL/uL   Retic Count, Absolute 86.5 19.0 - 183.0 K/uL    Comment: Performed at [facility], [address] [place], [place] [address]  Iron and TIBC     Status: None   Collection Time: 03/29/18 12:31 PM  Result Value Ref Range   Iron 52 28 - 170 ug/dL   TIBC 323 250 - 450 ug/dL   Saturation Ratios 16 10.4 - 31.8 %   UIBC 271 ug/dL    Comment: Performed at [facility], [address] [place], [place] [address]  Ferritin     Status: None   Collection Time: 03/29/18 12:31 PM  Result Value Ref Range   Ferritin 51 11 - 307 ng/mL    Comment: Performed at [facility], [address] [place], [place] [address]  Urinalysis, Complete w Microscopic     Status: Abnormal   Collection Time: 03/29/18 12:48 PM  Result Value Ref Range   Color, Urine YELLOW (A) YELLOW   APPearance CLEAR (A) CLEAR   Specific Gravity, Urine 1.011 1.005 - 1.030   pH 7.0 5.0 - 8.0   Glucose, UA NEGATIVE NEGATIVE mg/dL   Hgb urine dipstick NEGATIVE NEGATIVE   Bilirubin Urine NEGATIVE NEGATIVE   Ketones, ur NEGATIVE NEGATIVE mg/dL   Protein, ur NEGATIVE NEGATIVE mg/dL   Nitrite NEGATIVE NEGATIVE   Leukocytes, UA NEGATIVE NEGATIVE   RBC / HPF 0-5 0 - 5 RBC/hpf   WBC, UA 0-5 0 - 5 WBC/hpf   Bacteria, UA RARE (A) NONE SEEN   Squamous Epithelial / LPF 0-5 0 - 5    Comment: Please note change in reference range.   Mucus PRESENT     Comment: Performed at [facility], [address] [place], [place] [address]      PHQ2/9: Depression screen [facility] 2/9 04/05/2018 04/05/2018 10/30/2017 09/25/2017 07/03/2017  Decreased Interest 1 0 0 0 0  Down, Depressed, Hopeless 1 0 0 0 0  PHQ - 2 Score 2 0 0 0 0  Altered sleeping 0 0 - - -  Tired, decreased energy 3 0 - - -  Change in appetite 0 0 - - -  Feeling  bad or failure about yourself  0 0 - - -  Trouble concentrating 1 1 - - -  Moving slowly or fidgety/restless 2 0 - - -  Suicidal thoughts 0 0 - - -  PHQ-9 Score 8  1 - - -  Difficult doing work/chores Very difficult Not difficult at all - - -    Fall Risk: Fall Risk  01/01/2018 10/30/2017 09/25/2017 07/03/2017 01/02/2017  Falls in the past year? No No No No No  Comment - - - - -  Number falls in past yr: - - - - -  Comment - - - - -  Injury with Fall? - - - - -     Assessment & Plan  1. Anemia due to vitamin B12 deficiency, unspecified B12 deficiency type  - cyanocobalamin ((VITAMIN B-12)) injection 1,000 mcg  2. B12 deficiency  - cyanocobalamin ((VITAMIN B-12)) injection 1,000 mcg  3. Major depression, recurrent, chronic ([place])  She is worried about weight gain with prozac but knows she cannot stop medication  - gabapentin (NEURONTIN) 400 MG capsule; Take 1 capsule (400 mg total) by mouth 2 (two) times daily.  Dispense: 180 capsule; Refill: 1 - HYDROcodone-acetaminophen (NORCO) 10-325 MG tablet; Take 1 tablet by mouth every 8 (eight) hours as needed for severe pain.  Dispense: 45 tablet; Refill: 0  4. Fibromyalgia syndrome   5. Hyperglycemia  Last hgbA1C was over one year ago   6. Migraine without aura and without status migrainosus, not intractable  stable  7. Gastro-esophageal reflux disease without esophagitis  Taking Dexilan   8. CFIDS (chronic fatigue and immune dysfunction syndrome) ([facility])   9. Dyslipidemia  Cannot tolerate statin   10. Benign essential HTN  - olmesartan-hydrochlorothiazide (BENICAR HCT) 40-25 MG tablet; Take 1 tablet by mouth daily.  Dispense: 90 tablet; Refill: 1  11. Obstructive sleep apnea syndrome

## 2018-04-12 ENCOUNTER — Inpatient Hospital Stay: Payer: BLUE CROSS/BLUE SHIELD

## 2018-04-12 ENCOUNTER — Ambulatory Visit (INDEPENDENT_AMBULATORY_CARE_PROVIDER_SITE_OTHER): Payer: BLUE CROSS/BLUE SHIELD

## 2018-04-12 DIAGNOSIS — E538 Deficiency of other specified B group vitamins: Secondary | ICD-10-CM

## 2018-04-12 DIAGNOSIS — D519 Vitamin B12 deficiency anemia, unspecified: Secondary | ICD-10-CM | POA: Diagnosis not present

## 2018-04-12 MED ORDER — CYANOCOBALAMIN 1000 MCG/ML IJ SOLN
1000.0000 ug | Freq: Once | INTRAMUSCULAR | Status: AC
  Administered 2018-04-12: 1000 ug via INTRAMUSCULAR

## 2018-04-12 NOTE — Progress Notes (Signed)
cya

## 2018-04-20 ENCOUNTER — Ambulatory Visit (INDEPENDENT_AMBULATORY_CARE_PROVIDER_SITE_OTHER): Payer: BLUE CROSS/BLUE SHIELD

## 2018-04-20 ENCOUNTER — Inpatient Hospital Stay: Payer: BLUE CROSS/BLUE SHIELD

## 2018-04-20 DIAGNOSIS — E538 Deficiency of other specified B group vitamins: Secondary | ICD-10-CM | POA: Diagnosis not present

## 2018-04-20 MED ORDER — CYANOCOBALAMIN 1000 MCG/ML IJ SOLN
1000.0000 ug | INTRAMUSCULAR | Status: AC
  Administered 2018-04-26 – 2018-09-03 (×5): 1000 ug via INTRAMUSCULAR

## 2018-04-20 MED ORDER — CYANOCOBALAMIN 1000 MCG/ML IJ SOLN
1000.0000 ug | Freq: Once | INTRAMUSCULAR | Status: AC
  Administered 2018-04-20: 1000 ug via INTRAMUSCULAR

## 2018-04-20 NOTE — Progress Notes (Signed)
Patient came in for her B-12 injection. She/He tolerated it well. NKDA.    

## 2018-04-26 ENCOUNTER — Other Ambulatory Visit: Payer: Self-pay

## 2018-04-26 ENCOUNTER — Ambulatory Visit (INDEPENDENT_AMBULATORY_CARE_PROVIDER_SITE_OTHER): Payer: BLUE CROSS/BLUE SHIELD

## 2018-04-26 ENCOUNTER — Inpatient Hospital Stay: Payer: BLUE CROSS/BLUE SHIELD | Attending: Hematology and Oncology

## 2018-04-26 DIAGNOSIS — F339 Major depressive disorder, recurrent, unspecified: Secondary | ICD-10-CM

## 2018-04-26 DIAGNOSIS — E538 Deficiency of other specified B group vitamins: Secondary | ICD-10-CM

## 2018-04-26 MED ORDER — ALPRAZOLAM 0.5 MG PO TABS
ORAL_TABLET | ORAL | 0 refills | Status: DC
Start: 2018-04-26 — End: 2019-03-31

## 2018-04-26 NOTE — Telephone Encounter (Signed)
Refill request for general medication. Alprazolam to [facility]   Last office visit: 04/05/2018  Follow up on 07/06/18

## 2018-05-03 ENCOUNTER — Inpatient Hospital Stay: Payer: BLUE CROSS/BLUE SHIELD | Attending: Hematology and Oncology

## 2018-05-03 ENCOUNTER — Ambulatory Visit (INDEPENDENT_AMBULATORY_CARE_PROVIDER_SITE_OTHER): Payer: BLUE CROSS/BLUE SHIELD

## 2018-05-03 DIAGNOSIS — F339 Major depressive disorder, recurrent, unspecified: Secondary | ICD-10-CM

## 2018-05-03 DIAGNOSIS — D519 Vitamin B12 deficiency anemia, unspecified: Secondary | ICD-10-CM

## 2018-05-03 DIAGNOSIS — E538 Deficiency of other specified B group vitamins: Secondary | ICD-10-CM

## 2018-05-03 MED ORDER — CYANOCOBALAMIN 1000 MCG/ML IJ SOLN
1000.0000 ug | Freq: Once | INTRAMUSCULAR | Status: AC
  Administered 2018-05-03: 1000 ug via INTRAMUSCULAR

## 2018-05-10 ENCOUNTER — Inpatient Hospital Stay: Payer: BLUE CROSS/BLUE SHIELD | Attending: Hematology and Oncology

## 2018-05-10 ENCOUNTER — Ambulatory Visit (INDEPENDENT_AMBULATORY_CARE_PROVIDER_SITE_OTHER): Payer: BLUE CROSS/BLUE SHIELD

## 2018-05-10 DIAGNOSIS — E538 Deficiency of other specified B group vitamins: Secondary | ICD-10-CM

## 2018-05-10 NOTE — Progress Notes (Signed)
Patient came in for her B-12 injection. She/He tolerated it well. NKDA.    

## 2018-05-17 ENCOUNTER — Ambulatory Visit (INDEPENDENT_AMBULATORY_CARE_PROVIDER_SITE_OTHER): Payer: BLUE CROSS/BLUE SHIELD

## 2018-05-17 DIAGNOSIS — I10 Essential (primary) hypertension: Secondary | ICD-10-CM

## 2018-05-17 DIAGNOSIS — E538 Deficiency of other specified B group vitamins: Secondary | ICD-10-CM | POA: Diagnosis not present

## 2018-06-07 ENCOUNTER — Inpatient Hospital Stay: Payer: BLUE CROSS/BLUE SHIELD | Attending: Hematology and Oncology

## 2018-06-09 ENCOUNTER — Other Ambulatory Visit: Payer: Self-pay | Admitting: Family Medicine

## 2018-06-09 DIAGNOSIS — F339 Major depressive disorder, recurrent, unspecified: Secondary | ICD-10-CM

## 2018-06-09 NOTE — Telephone Encounter (Signed)
Refill request was sent to Dr. Krichna Sowles for approval and submission.  

## 2018-06-11 ENCOUNTER — Other Ambulatory Visit: Payer: Self-pay | Admitting: Family Medicine

## 2018-06-14 ENCOUNTER — Ambulatory Visit (INDEPENDENT_AMBULATORY_CARE_PROVIDER_SITE_OTHER): Payer: BLUE CROSS/BLUE SHIELD

## 2018-06-14 DIAGNOSIS — E538 Deficiency of other specified B group vitamins: Secondary | ICD-10-CM | POA: Diagnosis not present

## 2018-06-14 MED ORDER — CYANOCOBALAMIN 1000 MCG/ML IJ SOLN
1000.0000 ug | Freq: Once | INTRAMUSCULAR | Status: AC
  Administered 2018-06-14: 1000 ug via INTRAMUSCULAR

## 2018-07-03 ENCOUNTER — Other Ambulatory Visit: Payer: Self-pay | Admitting: Family Medicine

## 2018-07-03 DIAGNOSIS — M797 Fibromyalgia: Secondary | ICD-10-CM

## 2018-07-05 ENCOUNTER — Inpatient Hospital Stay: Payer: BLUE CROSS/BLUE SHIELD | Attending: Hematology and Oncology

## 2018-07-06 ENCOUNTER — Encounter: Payer: Self-pay | Admitting: Family Medicine

## 2018-07-06 ENCOUNTER — Ambulatory Visit: Payer: BLUE CROSS/BLUE SHIELD | Admitting: Family Medicine

## 2018-07-06 VITALS — BP 114/72 | HR 72 | Temp 97.9°F | Resp 16 | Ht 61.0 in | Wt 187.6 lb

## 2018-07-06 DIAGNOSIS — D8989 Other specified disorders involving the immune mechanism, not elsewhere classified: Secondary | ICD-10-CM

## 2018-07-06 DIAGNOSIS — R739 Hyperglycemia, unspecified: Secondary | ICD-10-CM

## 2018-07-06 DIAGNOSIS — K219 Gastro-esophageal reflux disease without esophagitis: Secondary | ICD-10-CM

## 2018-07-06 DIAGNOSIS — M797 Fibromyalgia: Secondary | ICD-10-CM | POA: Diagnosis not present

## 2018-07-06 DIAGNOSIS — D509 Iron deficiency anemia, unspecified: Secondary | ICD-10-CM

## 2018-07-06 DIAGNOSIS — F339 Major depressive disorder, recurrent, unspecified: Secondary | ICD-10-CM

## 2018-07-06 DIAGNOSIS — E538 Deficiency of other specified B group vitamins: Secondary | ICD-10-CM | POA: Diagnosis not present

## 2018-07-06 DIAGNOSIS — R5382 Chronic fatigue, unspecified: Secondary | ICD-10-CM

## 2018-07-06 DIAGNOSIS — I10 Essential (primary) hypertension: Secondary | ICD-10-CM

## 2018-07-06 DIAGNOSIS — G43009 Migraine without aura, not intractable, without status migrainosus: Secondary | ICD-10-CM

## 2018-07-06 DIAGNOSIS — E785 Hyperlipidemia, unspecified: Secondary | ICD-10-CM

## 2018-07-06 DIAGNOSIS — G4733 Obstructive sleep apnea (adult) (pediatric): Secondary | ICD-10-CM

## 2018-07-06 NOTE — Progress Notes (Signed)
Name: Deanna Baker   MRN: [id number]    DOB: [birth year]   Date:07/06/2018       Progress Note  Subjective  Chief Complaint  Chief Complaint  Patient presents with  . Hypertension    Edema in left ankle, dizziness occasionally over the past couple of months  . Depression  . Sleep Apnea  . Migraine  . Dyslipidemia  . Medication Refill    HPI  HTN: she has been taking medication and no chest pain,no dizzinesspalpitation , orthopnea or SOB. No side effects of medication, sheis onBenicar HCTZ. BP towards low end of normal, but no symptoms.   FMS: she continues to have daily pain, taking pain medications :gabapentin, and antidepressant, prn Hydrocodone ( Tramadol did not work for her)and sometimes with Zofran to control nausea. She has daily mental fogginess, trigger point painsand generalized body aches.Current pain is 4/10.Average pain 4/10  CFIDS: still feels tired all the time, but symptoms waxes and wanes in severity. Post-activity fatigue is severe. Off stimulants, but does not want to go back on medication She needs to nap most days - usually 2 hours. She has rebound fatigue after activity Unchanged   Reflux esophagitis: choking and also esophageal thickenes on CT chest/abdomen. Seen by Dr. Durwin Reges she had an EGD and esophageal dilation, and was diagnosed with reflux esophagitis and is still on Dexilant, She decided to wean medication because of low B12 and also worried about being the cause of weight gain. Now she has a dry cough, chest pain intermittently and indigestion, advised to resume medication   Major Depression: she was  taking Paxil and Wellbutrin, she has been off Wellbutrin March 2019 and started to taper off paxil a couple of months ago because of weight gain, she has been off mediation. Phq9 is higher, but she states she will get better once her grand-daughter goes to college - she states her schedule is busier now and is also an introvert and needs to be  alone.   ASCUS: discussed negative HPV, and repeat pap last year was normal, recheck yearly for a couple more years before going to every 3 years after that. She will reschedule appointment for November   OSA: she is wearing CPAP every night, all night. She tolerated mask well and it helps her with energy level in morning, however lately feeling tired all the time.  Migraine headaches: no recent episodes  Dyslipidemia: she refuses statin therapy, last lipid panel reviewed with patient and it did not look good, discussed life style modification, including eating more fish and tree nuts and try to exercise more often . She states not eating very much because of heat  Morbid obesity: lost 9 lbs since last visit, but skipping neurontin and stopping paxil. Discussed life style modification and healthier also.   Patient Active Problem List   Diagnosis Date Noted  . Anemia 04/04/2018  . B12 deficiency 03/29/2018  . Cognitive decline 01/02/2017  . Chronic kidney disease, stage 3 ([place]) 09/23/2016  . Sleep apnea 07/11/2016  . ASCUS favor benign 07/10/2016  . Memory loss 05/12/2016  . Smell or taste sensation disturbance 05/12/2016  . Personal history of colonic polyps   . Benign neoplasm of transverse colon   . Rectal polyp   . Tenosynovitis of thumb 03/12/2016  . Bilateral knee pain 03/12/2016  . Reflux esophagitis   . Stricture and stenosis of esophagus   . Abnormal findings-gastrointestinal tract   . Thickening of esophagus 08/03/2015  . Choking 07/04/2015  .  Benign essential HTN 05/01/2015  . CFIDS (chronic fatigue and immune dysfunction syndrome) ([place]) 05/01/2015  . Major depression, recurrent, chronic ([place]) 05/01/2015  . Dyslipidemia 05/01/2015  . Fibromyalgia syndrome 05/01/2015  . Blood glucose elevated 05/01/2015  . Eczema intertrigo 05/01/2015  . Iron deficiency anemia due to chronic blood loss 05/01/2015  . Migraine without aura and without status migrainosus, not  intractable 05/01/2015  . Excessive urination at night 05/01/2015  . Dysmetabolic syndrome 66/04/3015  . History of colonoscopy with polypectomy 05/01/2015  . Central sleep apnea 05/01/2015  . History of back surgery 05/01/2015  . Thickened nails 05/01/2015  . Malignant neoplasm of upper-outer quadrant of female breast, triple negative 06/13/2013    Past Surgical History:  Procedure Laterality Date  . BACK SURGERY    . BREAST BIOPSY Left 10/2012   +  . 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 BREAST LUMPECTOMY Left 12/06/12    lumpectomy with SN biopsy and power port placement...  . COLONOSCOPY  2013   Dr. Suzette Battiest   . COLONOSCOPY WITH PROPOFOL N/A 04/28/2016   Procedure: COLONOSCOPY WITH PROPOFOL;  Surgeon: Lucilla Lame, MD;  Location: [facility];  Service: Endoscopy;  Laterality: N/A;  CPAP  . ESOPHAGOGASTRODUODENOSCOPY (EGD) WITH PROPOFOL N/A 10/23/2015   Procedure: ESOPHAGOGASTRODUODENOSCOPY (EGD) WITH PROPOFOL;  Surgeon: Lucilla Lame, MD;  Location: [facility];  Service: Endoscopy;  Laterality: N/A;  . POLYPECTOMY  04/28/2016   Procedure: POLYPECTOMY;  Surgeon: Lucilla Lame, MD;  Location: [facility];  Service: Endoscopy;;  . SHOULDER SURGERY Left 2009  . TONSILLECTOMY  1965  . TUBAL LIGATION  1982  . UPPER GI ENDOSCOPY     Dr. Suzette Battiest    Family History  Problem Relation Age of Onset  . Breast cancer Mother 12  . Breast cancer Maternal Aunt   . Brain cancer Paternal Uncle        x 2 mat uncles    Social History   Socioeconomic History  . Marital status: Married    Spouse name: Not on file  . Number of children: Not on file  . Years of education: Not on file  . Highest education level: Not on file  Occupational History  . Not on file  Social Needs  . Financial resource strain: Not on file  . Food insecurity:    Worry: Not on file    Inability: Not on file  . Transportation needs:    Medical: Not on file     Non-medical: Not on file  Tobacco Use  . Smoking status: Former Smoker    Packs/day: 1.00    Years: 20.00    Pack years: 20.00    Types: Cigarettes    Last attempt to quit: 11/24/1980    Years since quitting: 37.6  . Smokeless tobacco: Never Used  Substance and Sexual Activity  . Alcohol use: Yes    Alcohol/week: 0.0 standard drinks    Comment: rarely, 1-2x/mo  . Drug use: No  . Sexual activity: Yes    Partners: Male  Lifestyle  . Physical activity:    Days per week: Not on file    Minutes per session: Not on file  . Stress: Not on file  Relationships  . Social connections:    Talks on phone: Not on file    Gets together: Not on file    Attends religious service: Not on file    Active member of club or organization: Not on  file    Attends meetings of clubs or organizations: Not on file    Relationship status: Not on file  . Intimate partner violence:    Fear of current or ex partner: Not on file    Emotionally abused: Not on file    Physically abused: Not on file    Forced sexual activity: Not on file  Other Topics Concern  . Not on file  Social History Narrative  . Not on file     Current Outpatient Medications:  .  ALPRAZolam (XANAX) 0.5 MG tablet, TAKE 1 TABLET BY MOUTH TWICE A DAY AS NEEDED FOR ANXIETY, Disp: 30 tablet, Rfl: 0 .  dexlansoprazole (DEXILANT) 60 MG capsule, Take 1 capsule (60 mg total) by mouth daily., Disp: 90 capsule, Rfl: 1 .  docusate sodium (COLACE) 100 MG capsule, Take 100 mg by mouth 2 (two) times daily., Disp: , Rfl:  .  gabapentin (NEURONTIN) 400 MG capsule, TAKE 1 CAPSULE (400 MG TOTAL) BY MOUTH 2 (TWO) TIMES DAILY. (Patient taking differently: Take 400 mg by mouth as needed. ), Disp: 180 capsule, Rfl: 1 .  HYDROcodone-acetaminophen (NORCO) 10-325 MG tablet, Take 1 tablet by mouth every 8 (eight) hours as needed for severe pain., Disp: 45 tablet, Rfl: 0 .  Nerve Stimulator (STANDARD TENS) DEVI, 1 Units by Does not apply route daily., Disp: 1  Device, Rfl: 0 .  olmesartan-hydrochlorothiazide (BENICAR HCT) 40-25 MG tablet, Take 1 tablet by mouth daily., Disp: 90 tablet, Rfl: 1 .  ondansetron (ZOFRAN) 4 MG tablet, TAKE 1 TABLET EVERY 8 HOURS AS NEEDED FOR NAUSEA AND VOMITING, Disp: 20 tablet, Rfl: 0 .  [employer] IRON 240 (27 Fe) MG tablet, TAKE 1 TABLET BY MOUTH EVERY DAY IN THE MORNING (Patient not taking: Reported on 07/06/2018), Disp: 100 tablet, Rfl: 0  Current Facility-Administered Medications:  .  cyanocobalamin ((VITAMIN B-12)) injection 1,000 mcg, 1,000 mcg, Intramuscular, Q30 days, Steele Sizer, MD, 1,000 mcg at 07/06/18 1040  Allergies  Allergen Reactions  . Ace Inhibitors Swelling  . Pregabalin     weight gain and blurred vision  . Venlafaxine     foggy minded     ROS  Constitutional: Negative for fever, positive for  weight change.  Respiratory: Positive  for cough - dry going on for the past month, but no shortness of breath.   Cardiovascular: Negative for chest pain or palpitations.  Gastrointestinal: Negative for abdominal pain, no bowel changes.  Musculoskeletal: Negative for gait problem or joint swelling.  Skin: Negative for rash.  Neurological: Negative for dizziness or headache.  No other specific complaints in a complete review of systems (except as listed in HPI above).  Objective  Vitals:   07/06/18 1041  BP: 114/72  Pulse: 72  Resp: 16  Temp: 97.9 F (36.6 C)  TempSrc: Oral  SpO2: 98%  Weight: 187 lb 9.6 oz (85.1 kg)  Height: 5\' 1"  (1.549 m)    Body mass index is 35.45 kg/m.  Physical Exam  Constitutional: Patient appears well-developed and well-nourished. Obese  No distress.  HEENT: head atraumatic, normocephalic, pupils equal and reactive to light, neck supple, throat within normal limits Cardiovascular: Normal rate, regular rhythm and normal heart sounds.  No murmur heard. No BLE edema. Pulmonary/Chest: Effort normal and breath sounds normal. No respiratory distress. Abdominal:  Soft.  There is no tenderness. Muscular Skeletal: trigger point positive Psychiatric: Patient has a normal mood and affect. behavior is normal. Judgment and thought content normal.  PHQ2/9: Depression screen PHQ  2/9 07/06/2018 04/05/2018 04/05/2018 10/30/2017 09/25/2017  Decreased Interest 2 1 0 0 0  Down, Depressed, Hopeless 1 1 0 0 0  PHQ - 2 Score 3 2 0 0 0  Altered sleeping 1 0 0 - -  Tired, decreased energy 2 3 0 - -  Change in appetite 1 0 0 - -  Feeling bad or failure about yourself  1 0 0 - -  Trouble concentrating 1 1 1  - -  Moving slowly or fidgety/restless 2 2 0 - -  Suicidal thoughts 0 0 0 - -  PHQ-9 Score 11 8 1  - -  Difficult doing work/chores Very difficult Very difficult Not difficult at all - -     Fall Risk: Fall Risk  01/01/2018 10/30/2017 09/25/2017 07/03/2017 01/02/2017  Falls in the past year? No No No No No  Comment - - - - -  Number falls in past yr: - - - - -  Comment - - - - -  Injury with Fall? - - - - -     Functional Status Survey: Is the patient deaf or have difficulty hearing?: No Does the patient have difficulty seeing, even when wearing glasses/contacts?: Yes(reading glasses) Does the patient have difficulty concentrating, remembering, or making decisions?: No Does the patient have difficulty walking or climbing stairs?: Yes Does the patient have difficulty dressing or bathing?: No Does the patient have difficulty doing errands alone such as visiting a doctor's office or shopping?: No    Assessment & Plan  1. Benign essential HTN  Towards low end of normal, discussed decreasing dose, but she wants to monitor at this time - COMPLETE METABOLIC PANEL WITH GFR  2. B12 deficiency  - Vitamin B12  3. Major depression, recurrent, chronic (H[facility])  Stopped medication on her own because of mental fogginess and weight gain. Phq9 has increased   4. Fibromyalgia syndrome  Stable  5. Gastro-esophageal reflux disease without esophagitis  She stopped  Dexilant, explained that it may be the cause of her dry cough, advised to resume medication and see if symptoms resolves   6. Migraine without aura and without status migrainosus, not intractable  Resolved   7. Obstructive sleep apnea syndrome  Continue CPAP   8. CFIDS (chronic fatigue and immune dysfunction syndrome) (HCC)  Feeling worse with the heat and increase in stress   9. Dyslipidemia  - Lipid panel  10. Iron deficiency anemia, unspecified iron deficiency anemia type  - CBC with Differential/Platelet - Iron, TIBC and Ferritin Panel    Recheck labs  12. Hyperglycemia  - Hemoglobin A1c

## 2018-07-24 ENCOUNTER — Other Ambulatory Visit: Payer: Self-pay | Admitting: Family Medicine

## 2018-07-24 DIAGNOSIS — K219 Gastro-esophageal reflux disease without esophagitis: Secondary | ICD-10-CM

## 2018-08-02 ENCOUNTER — Ambulatory Visit (INDEPENDENT_AMBULATORY_CARE_PROVIDER_SITE_OTHER): Payer: BLUE CROSS/BLUE SHIELD

## 2018-08-02 ENCOUNTER — Inpatient Hospital Stay: Payer: BLUE CROSS/BLUE SHIELD | Attending: Hematology and Oncology

## 2018-08-02 DIAGNOSIS — Z23 Encounter for immunization: Secondary | ICD-10-CM | POA: Diagnosis not present

## 2018-08-06 ENCOUNTER — Ambulatory Visit (INDEPENDENT_AMBULATORY_CARE_PROVIDER_SITE_OTHER): Payer: BLUE CROSS/BLUE SHIELD

## 2018-08-06 DIAGNOSIS — E538 Deficiency of other specified B group vitamins: Secondary | ICD-10-CM | POA: Diagnosis not present

## 2018-08-30 ENCOUNTER — Inpatient Hospital Stay: Payer: BLUE CROSS/BLUE SHIELD | Attending: Hematology and Oncology

## 2018-09-03 ENCOUNTER — Ambulatory Visit (INDEPENDENT_AMBULATORY_CARE_PROVIDER_SITE_OTHER): Payer: BLUE CROSS/BLUE SHIELD

## 2018-09-03 DIAGNOSIS — E538 Deficiency of other specified B group vitamins: Secondary | ICD-10-CM | POA: Diagnosis not present

## 2018-09-08 ENCOUNTER — Ambulatory Visit: Payer: BLUE CROSS/BLUE SHIELD | Admitting: Nurse Practitioner

## 2018-09-08 ENCOUNTER — Encounter: Payer: Self-pay | Admitting: Nurse Practitioner

## 2018-09-08 VITALS — BP 142/80 | HR 81 | Temp 98.0°F | Resp 16 | Ht 60.25 in | Wt 190.6 lb

## 2018-09-08 DIAGNOSIS — I10 Essential (primary) hypertension: Secondary | ICD-10-CM

## 2018-09-08 DIAGNOSIS — R05 Cough: Secondary | ICD-10-CM | POA: Diagnosis not present

## 2018-09-08 DIAGNOSIS — R053 Chronic cough: Secondary | ICD-10-CM

## 2018-09-08 MED ORDER — AMLODIPINE BESYLATE 5 MG PO TABS: 5.0000 mg | ORAL_TABLET | Freq: Every day | ORAL | 0 refills | Status: DC

## 2018-09-08 MED ORDER — CHLORTHALIDONE 25 MG PO TABS: 25.0000 mg | ORAL_TABLET | Freq: Every day | ORAL | 0 refills | Status: DC

## 2018-09-08 NOTE — Patient Instructions (Addendum)
-   Please stop your blood pressure medications and start the newly prescribed blood pressure medications. - Please come in one week for re-evaluation.  - Please take loratadine 10mg  daily for 14 days.    Your goal blood pressure is less than 140 mmHg on top. Try to follow the DASH guidelines (DASH stands for Dietary Approaches to Stop Hypertension) Try to limit the sodium in your diet.  Ideally, consume less than 1.5 grams (less than 1,500mg ) per day. Do not add salt when cooking or at the table.  Check the sodium amount on labels when shopping, and choose items lower in sodium when given a choice. Avoid or limit foods that already contain a lot of sodium. Eat a diet rich in fruits and vegetables and whole grains.

## 2018-09-08 NOTE — Progress Notes (Signed)
Name: Deanna Baker   MRN: [id number]    DOB: 1961/06/21   Date:09/08/2018       Progress Note  Subjective  Chief Complaint  Chief Complaint  Patient presents with  . URI    Has been going on since summer    HPI  Patient presents with cough and sneezing ongoing for months. States was taking coricidin products that would help while she was taking it but it would come right back when she start. States persistent dry cough for months. Occasionally has some sneezing and face gets red but feels like dry cough is the most persistent. No shortness of breath, sore throat, fevers, chills, body aches.   Patient Active Problem List   Diagnosis Date Noted  . Anemia 04/04/2018  . B12 deficiency 03/29/2018  . Cognitive decline 01/02/2017  . Chronic kidney disease, stage 3 ([place]) 09/23/2016  . Sleep apnea 07/11/2016  . ASCUS favor benign 07/10/2016  . Memory loss 05/12/2016  . Smell or taste sensation disturbance 05/12/2016  . Personal history of colonic polyps   . Benign neoplasm of transverse colon   . Rectal polyp   . Tenosynovitis of thumb 03/12/2016  . Bilateral knee pain 03/12/2016  . Reflux esophagitis   . Stricture and stenosis of esophagus   . Abnormal findings-gastrointestinal tract   . Thickening of esophagus 08/03/2015  . Choking 07/04/2015  . Benign essential HTN 05/01/2015  . CFIDS (chronic fatigue and immune dysfunction syndrome) ([place]) 05/01/2015  . Major depression, recurrent, chronic ([place]) 05/01/2015  . Dyslipidemia 05/01/2015  . Fibromyalgia syndrome 05/01/2015  . Blood glucose elevated 05/01/2015  . Eczema intertrigo 05/01/2015  . Iron deficiency anemia due to chronic blood loss 05/01/2015  . Migraine without aura and without status migrainosus, not intractable 05/01/2015  . Excessive urination at night 05/01/2015  . Dysmetabolic syndrome 48/54/6270  . History of colonoscopy with polypectomy 05/01/2015  . Central sleep apnea 05/01/2015  . History of back surgery  05/01/2015  . Thickened nails 05/01/2015  . Malignant neoplasm of upper-outer quadrant of female breast, triple negative 06/13/2013    Past Medical History:  Diagnosis Date  . Anemia   . Arthritis 1990  . Bronchitis 5/17  . Chronic fatigue syndrome   . Colon polyp 2013  . Fibromyalgia 1993  . GERD (gastroesophageal reflux disease)   . Heart murmur   . Hypertension 2012  . Lump or mass in breast   . Malignant neoplasm of upper-outer quadrant of female breast ([place])    Invasive mammary carcinoma, triple negative  . Measles   . Mumps 1973  . MVP (mitral valve prolapse)   . Obstructive sleep apnea on CPAP 11/27/15  . Personal history of radiation therapy    left  . Shortness of breath dyspnea    chronic fatigue/fibromyalgia    Past Surgical History:  Procedure Laterality Date  . BACK SURGERY    . BREAST BIOPSY Left 10/2012   +  . 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 BREAST LUMPECTOMY Left 12/06/12    lumpectomy with SN biopsy and power port placement...  . COLONOSCOPY  2013   Dr. Suzette Battiest   . COLONOSCOPY WITH PROPOFOL N/A 04/28/2016   Procedure: COLONOSCOPY WITH PROPOFOL;  Surgeon: Lucilla Lame, MD;  Location: [facility];  Service: Endoscopy;  Laterality: N/A;  CPAP  . ESOPHAGOGASTRODUODENOSCOPY (EGD) WITH PROPOFOL N/A 10/23/2015   Procedure: ESOPHAGOGASTRODUODENOSCOPY (EGD) WITH PROPOFOL;  Surgeon: Lucilla Lame, MD;  Location:  [facility];  Service: Endoscopy;  Laterality: N/A;  . POLYPECTOMY  04/28/2016   Procedure: POLYPECTOMY;  Surgeon: Lucilla Lame, MD;  Location: [facility];  Service: Endoscopy;;  . SHOULDER SURGERY Left 2009  . TONSILLECTOMY  1965  . TUBAL LIGATION  1982  . UPPER GI ENDOSCOPY     Dr. Suzette Battiest    Social History   Tobacco Use  . Smoking status: Former Smoker    Packs/day: 1.00    Years: 20.00    Pack years: 20.00    Types: Cigarettes    Last attempt to quit: 11/24/1980    Years since quitting:  37.8  . Smokeless tobacco: Never Used  Substance Use Topics  . Alcohol use: Yes    Alcohol/week: 0.0 standard drinks    Comment: rarely, 1-2x/mo     Current Outpatient Medications:  .  ALPRAZolam (XANAX) 0.5 MG tablet, TAKE 1 TABLET BY MOUTH TWICE A DAY AS NEEDED FOR ANXIETY, Disp: 30 tablet, Rfl: 0 .  DEXILANT 60 MG capsule, TAKE 1 CAPSULE BY MOUTH EVERY DAY, Disp: 90 capsule, Rfl: 1 .  amLODipine (NORVASC) 5 MG tablet, Take 1 tablet (5 mg total) by mouth daily., Disp: 30 tablet, Rfl: 0 .  chlorthalidone (HYGROTON) 25 MG tablet, Take 1 tablet (25 mg total) by mouth daily., Disp: 30 tablet, Rfl: 0 .  docusate sodium (COLACE) 100 MG capsule, Take 100 mg by mouth 2 (two) times daily., Disp: , Rfl:  .  Nerve Stimulator (STANDARD TENS) DEVI, 1 Units by Does not apply route daily., Disp: 1 Device, Rfl: 0  Allergies  Allergen Reactions  . Ace Inhibitors Swelling  . Pregabalin     weight gain and blurred vision  . Venlafaxine     foggy minded    ROS  No other specific complaints in a complete review of systems (except as listed in HPI above).  Objective  Vitals:   09/08/18 1522  BP: (!) 142/80  Pulse: 81  Resp: 16  Temp: 98 F (36.7 C)  SpO2: 98%  Weight: 190 lb 9.6 oz (86.5 kg)  Height: 5' 0.25" (1.53 m)     Body mass index is 36.92 kg/m.  Nursing Note and Vital Signs reviewed.  Physical Exam  Constitutional: She is oriented to person, place, and time. She appears well-developed and well-nourished.  HENT:  Head: Normocephalic and atraumatic.  Nose: Nose normal.  Mouth/Throat: Oropharynx is clear and moist. No oropharyngeal exudate.  Eyes: Conjunctivae are normal.  Neck: Normal range of motion. Neck supple.  Cardiovascular: Normal rate, regular rhythm, normal heart sounds and intact distal pulses.  Pulmonary/Chest: Effort normal and breath sounds normal. No stridor. No respiratory distress. She has no wheezes. She has no rales.  Lymphadenopathy:    She has no  cervical adenopathy.  Neurological: She is alert and oriented to person, place, and time. Coordination normal.  Skin: Skin is warm and dry. She is not diaphoretic. No erythema.  Psychiatric: She has a normal mood and affect. Her behavior is normal. Judgment and thought content normal.       No results found for this or any previous visit (from the past 48 hour(s)).  Assessment & Plan  1. Persistent dry cough Stop ARB. Offered cough medicine, states will take coricidin if needed.  Recommend daily anti-histamine for at least 2 weeks.  Follow up in one week- if unimproved consider chest x-ray, pfts.   2. Benign essential HTN Stop benicar.  - chlorthalidone (HYGROTON) 25 MG tablet; Take  1 tablet (25 mg total) by mouth daily.  Dispense: 30 tablet; Refill: 0 - amLODipine (NORVASC) 5 MG tablet; Take 1 tablet (5 mg total) by mouth daily.  Dispense: 30 tablet; Refill: 0

## 2018-09-15 ENCOUNTER — Ambulatory Visit: Payer: BLUE CROSS/BLUE SHIELD | Admitting: Nurse Practitioner

## 2018-09-15 ENCOUNTER — Encounter: Payer: Self-pay | Admitting: Nurse Practitioner

## 2018-09-15 VITALS — BP 160/90 | HR 70 | Temp 97.8°F | Resp 16 | Ht 60.25 in | Wt 188.3 lb

## 2018-09-15 DIAGNOSIS — K219 Gastro-esophageal reflux disease without esophagitis: Secondary | ICD-10-CM

## 2018-09-15 DIAGNOSIS — R05 Cough: Secondary | ICD-10-CM | POA: Diagnosis not present

## 2018-09-15 DIAGNOSIS — I10 Essential (primary) hypertension: Secondary | ICD-10-CM | POA: Diagnosis not present

## 2018-09-15 DIAGNOSIS — R053 Chronic cough: Secondary | ICD-10-CM

## 2018-09-15 MED ORDER — AMLODIPINE BESYLATE 10 MG PO TABS: 10.0000 mg | ORAL_TABLET | Freq: Every day | ORAL | 0 refills | Status: DC

## 2018-09-15 NOTE — Progress Notes (Signed)
Name: Deanna Baker   MRN: [id number]    DOB: 10/30/1961   Date:09/15/2018       Progress Note  Subjective  Chief Complaint  Chief Complaint  Patient presents with  . Hypertension  . Cough    HPI  Patient presents for follow-up for persistent dry cough; olmesartan was stopped and patient was switched to amlodipine 5mg  and chlorthalidone 25mg .   States cough has improved; still minimal dry cough but states dusty feeling in chest and throat has completely resolved. States still sneezing here and there.   Patient Active Problem List   Diagnosis Date Noted  . Anemia 04/04/2018  . B12 deficiency 03/29/2018  . Cognitive decline 01/02/2017  . Chronic kidney disease, stage 3 ([place]) 09/23/2016  . Sleep apnea 07/11/2016  . ASCUS favor benign 07/10/2016  . Memory loss 05/12/2016  . Smell or taste sensation disturbance 05/12/2016  . Personal history of colonic polyps   . Benign neoplasm of transverse colon   . Rectal polyp   . Tenosynovitis of thumb 03/12/2016  . Bilateral knee pain 03/12/2016  . Reflux esophagitis   . Stricture and stenosis of esophagus   . Abnormal findings-gastrointestinal tract   . Thickening of esophagus 08/03/2015  . Choking 07/04/2015  . Benign essential HTN 05/01/2015  . CFIDS (chronic fatigue and immune dysfunction syndrome) ([place]) 05/01/2015  . Major depression, recurrent, chronic ([place]) 05/01/2015  . Dyslipidemia 05/01/2015  . Fibromyalgia syndrome 05/01/2015  . Blood glucose elevated 05/01/2015  . Eczema intertrigo 05/01/2015  . Iron deficiency anemia due to chronic blood loss 05/01/2015  . Migraine without aura and without status migrainosus, not intractable 05/01/2015  . Excessive urination at night 05/01/2015  . Dysmetabolic syndrome 30/16/0109  . History of colonoscopy with polypectomy 05/01/2015  . Central sleep apnea 05/01/2015  . History of back surgery 05/01/2015  . Thickened nails 05/01/2015  . Malignant neoplasm of upper-outer quadrant of  female breast, triple negative 06/13/2013    Past Medical History:  Diagnosis Date  . Anemia   . Arthritis 1990  . Bronchitis 5/17  . Chronic fatigue syndrome   . Colon polyp 2013  . Fibromyalgia 1993  . GERD (gastroesophageal reflux disease)   . Heart murmur   . Hypertension 2012  . Lump or mass in breast   . Malignant neoplasm of upper-outer quadrant of female breast ([place])    Invasive mammary carcinoma, triple negative  . Measles   . Mumps 1973  . MVP (mitral valve prolapse)   . Obstructive sleep apnea on CPAP 11/27/15  . Personal history of radiation therapy    left  . Shortness of breath dyspnea    chronic fatigue/fibromyalgia    Past Surgical History:  Procedure Laterality Date  . BACK SURGERY    . BREAST BIOPSY Left 10/2012   +  . 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 BREAST LUMPECTOMY Left 12/06/12    lumpectomy with SN biopsy and power port placement...  . COLONOSCOPY  2013   Dr. Suzette Battiest   . COLONOSCOPY WITH PROPOFOL N/A 04/28/2016   Procedure: COLONOSCOPY WITH PROPOFOL;  Surgeon: Lucilla Lame, MD;  Location: [facility];  Service: Endoscopy;  Laterality: N/A;  CPAP  . ESOPHAGOGASTRODUODENOSCOPY (EGD) WITH PROPOFOL N/A 10/23/2015   Procedure: ESOPHAGOGASTRODUODENOSCOPY (EGD) WITH PROPOFOL;  Surgeon: Lucilla Lame, MD;  Location: [facility];  Service: Endoscopy;  Laterality: N/A;  . POLYPECTOMY  04/28/2016   Procedure: POLYPECTOMY;  Surgeon: Lucilla Lame, MD;  Location: [facility];  Service: Endoscopy;;  . SHOULDER SURGERY Left 2009  . TONSILLECTOMY  1965  . TUBAL LIGATION  1982  . UPPER GI ENDOSCOPY     Dr. Suzette Battiest    Social History   Tobacco Use  . Smoking status: Former Smoker    Packs/day: 1.00    Years: 20.00    Pack years: 20.00    Types: Cigarettes    Last attempt to quit: 11/24/1980    Years since quitting: 37.8  . Smokeless tobacco: Never Used  Substance Use Topics  . Alcohol use: Yes     Alcohol/week: 0.0 standard drinks    Comment: rarely, 1-2x/mo     Current Outpatient Medications:  .  ALPRAZolam (XANAX) 0.5 MG tablet, TAKE 1 TABLET BY MOUTH TWICE A DAY AS NEEDED FOR ANXIETY, Disp: 30 tablet, Rfl: 0 .  amLODipine (NORVASC) 10 MG tablet, Take 1 tablet (10 mg total) by mouth daily., Disp: 30 tablet, Rfl: 0 .  chlorthalidone (HYGROTON) 25 MG tablet, Take 1 tablet (25 mg total) by mouth daily., Disp: 30 tablet, Rfl: 0 .  DEXILANT 60 MG capsule, TAKE 1 CAPSULE BY MOUTH EVERY DAY, Disp: 90 capsule, Rfl: 1 .  Nerve Stimulator (STANDARD TENS) DEVI, 1 Units by Does not apply route daily., Disp: 1 Device, Rfl: 0 .  docusate sodium (COLACE) 100 MG capsule, Take 100 mg by mouth 2 (two) times daily., Disp: , Rfl:   Allergies  Allergen Reactions  . Ace Inhibitors Swelling  . Pregabalin     weight gain and blurred vision  . Venlafaxine     foggy minded    ROS   No other specific complaints in a complete review of systems (except as listed in HPI above).  Objective  Vitals:   09/15/18 0905 09/15/18 0906  BP: (!) 150/100 (!) 160/90  Pulse: 70   Resp: 16   Temp: 97.8 F (36.6 C)   TempSrc: Oral   SpO2: 98%   Weight: 188 lb 4.8 oz (85.4 kg)   Height: 5' 0.25" (1.53 m)      Body mass index is 36.47 kg/m.  Nursing Note and Vital Signs reviewed.  Physical Exam  Constitutional: She is oriented to person, place, and time. She appears well-developed and well-nourished. She is cooperative.  HENT:  Head: Normocephalic and atraumatic.  Right Ear: Hearing normal.  Left Ear: Hearing normal.  Mouth/Throat: Mucous membranes are normal.  Eyes: Conjunctivae are normal.  Cardiovascular: Normal rate, regular rhythm and normal heart sounds.  Pulmonary/Chest: Effort normal and breath sounds normal.  Abdominal: Normal appearance.  Musculoskeletal: Normal range of motion.  Neurological: She is alert and oriented to person, place, and time.  Psychiatric: She has a normal mood  and affect. Her speech is normal and behavior is normal. Judgment and thought content normal.     No results found for this or any previous visit (from the past 48 hour(s)).  Assessment & Plan  1. Persistent dry cough Largely resolved   2. Benign essential HTN Increased amlodipine, will check BP over the next week and let us know so we can adjust medicines as needed. Patient states has appointment in November unable to come in office for recheck - amLODipine (NORVASC) 10 MG tablet; Take 1 tablet (10 mg total) by mouth daily.  Dispense: 30 tablet; Refill: 0  3. Gastro-esophageal reflux disease without esophagitis Smaller frequent meals, discussed keeping food diary, take antacids PRN in addition to dexilant, wedge.

## 2018-09-15 NOTE — Patient Instructions (Addendum)
-   take 2 of the amlodipine a total of 10mg  daily in addition to the chlorthalidone 25mg  daily  - Please check your blood pressure 3 times a week when you are well rested and relaxed and send [place] these readings via phone or mychart.  - Eat smaller, frequent meals and keep food diary of symptoms to try to see what your triggers might be - You can take an antacid when you are having acid-reflux symptoms. Antacids with calcium can lead to firmer stools and ones with magnesium can lead to softer stools.

## 2018-09-27 ENCOUNTER — Other Ambulatory Visit: Payer: Self-pay

## 2018-09-27 DIAGNOSIS — D649 Anemia, unspecified: Secondary | ICD-10-CM

## 2018-09-27 DIAGNOSIS — Z171 Estrogen receptor negative status [ER-]: Principal | ICD-10-CM

## 2018-09-27 DIAGNOSIS — C50412 Malignant neoplasm of upper-outer quadrant of left female breast: Secondary | ICD-10-CM

## 2018-09-28 LAB — COMPLETE METABOLIC PANEL WITH GFR
AG Ratio: 1.8 (calc) (ref 1.0–2.5)
ALBUMIN MSPROF: 4.2 g/dL (ref 3.6–5.1)
ALKALINE PHOSPHATASE (APISO): 113 U/L (ref 33–130)
ALT: 16 U/L (ref 6–29)
AST: 17 U/L (ref 10–35)
BILIRUBIN TOTAL: 0.5 mg/dL (ref 0.2–1.2)
BUN: 13 mg/dL (ref 7–25)
CHLORIDE: 106 mmol/L (ref 98–110)
CO2: 28 mmol/L (ref 20–32)
CREATININE: 0.87 mg/dL (ref 0.50–1.05)
Calcium: 9.5 mg/dL (ref 8.6–10.4)
GFR, EST AFRICAN AMERICAN: 86 mL/min/{1.73_m2} (ref >=60)
GFR, Est Non African American: 74 mL/min/{1.73_m2} (ref >=60)
GLOBULIN: 2.3 g/dL (ref 1.9–3.7)
GLUCOSE: 116 mg/dL — AB (ref 65–99)
Potassium: 4.1 mmol/L (ref 3.5–5.3)
SODIUM: 141 mmol/L (ref 135–146)
TOTAL PROTEIN: 6.5 g/dL (ref 6.1–8.1)

## 2018-09-28 LAB — CBC WITH DIFFERENTIAL/PLATELET
BASOS PCT: 0.2 %
Basophils Absolute: 10 cells/uL (ref 0–200)
EOS ABS: 140 {cells}/uL (ref 15–500)
Eosinophils Relative: 2.7 %
HCT: 38.4 % (ref 35.0–45.0)
HEMOGLOBIN: 12.8 g/dL (ref 11.7–15.5)
Lymphs Abs: 1128 cells/uL (ref 850–3900)
MCH: 29.3 pg (ref 27.0–33.0)
MCHC: 33.3 g/dL (ref 32.0–36.0)
MCV: 87.9 fL (ref 80.0–100.0)
MONOS PCT: 5.2 %
MPV: 10.3 fL (ref 7.5–12.5)
NEUTROS ABS: 3650 {cells}/uL (ref 1500–7800)
Neutrophils Relative %: 70.2 %
PLATELETS: 233 10*3/uL (ref 140–400)
RBC: 4.37 10*6/uL (ref 3.80–5.10)
RDW: 13 % (ref 11.0–15.0)
TOTAL LYMPHOCYTE: 21.7 %
WBC: 5.2 10*3/uL (ref 3.8–10.8)
WBCMIX: 270 {cells}/uL (ref 200–950)

## 2018-09-28 LAB — LIPID PANEL
Cholesterol: 247 mg/dL — ABNORMAL HIGH (ref <200)
HDL: 56 mg/dL (ref >50)
LDL CHOLESTEROL (CALC): 160 mg/dL — AB
NON-HDL CHOLESTEROL (CALC): 191 mg/dL — AB (ref <130)
TRIGLYCERIDES: 163 mg/dL — AB (ref <150)
Total CHOL/HDL Ratio: 4.4 (calc) (ref <5.0)

## 2018-09-28 LAB — IRON,TIBC AND FERRITIN PANEL
%SAT: 28 % (calc) (ref 16–45)
Ferritin: 68 ng/mL (ref 16–232)
IRON: 88 ug/dL (ref 45–160)
TIBC: 318 ug/dL (ref 250–450)

## 2018-09-28 LAB — HEMOGLOBIN A1C
Hgb A1c MFr Bld: 5.8 % of total Hgb — ABNORMAL HIGH (ref <5.7)
Mean Plasma Glucose: 120 (calc)
eAG (mmol/L): 6.6 (calc)

## 2018-09-28 LAB — VITAMIN B12: Vitamin B-12: 1142 pg/mL — ABNORMAL HIGH (ref 200–1100)

## 2018-09-28 LAB — CANCER ANTIGEN 27.29: CA 27.29: 21 U/mL (ref <38)

## 2018-09-30 ENCOUNTER — Encounter: Payer: Self-pay | Admitting: Family Medicine

## 2018-09-30 ENCOUNTER — Inpatient Hospital Stay: Payer: BLUE CROSS/BLUE SHIELD

## 2018-09-30 ENCOUNTER — Inpatient Hospital Stay: Payer: BLUE CROSS/BLUE SHIELD | Attending: Hematology and Oncology | Admitting: Hematology and Oncology

## 2018-09-30 ENCOUNTER — Ambulatory Visit: Payer: BLUE CROSS/BLUE SHIELD | Admitting: Family Medicine

## 2018-09-30 VITALS — BP 142/88 | HR 80 | Temp 98.0°F | Resp 18 | Ht 60.0 in | Wt 188.4 lb

## 2018-09-30 VITALS — BP 139/83 | HR 60 | Temp 98.2°F | Resp 18 | Wt 188.6 lb

## 2018-09-30 DIAGNOSIS — I10 Essential (primary) hypertension: Secondary | ICD-10-CM | POA: Diagnosis not present

## 2018-09-30 DIAGNOSIS — Z923 Personal history of irradiation: Secondary | ICD-10-CM | POA: Diagnosis not present

## 2018-09-30 DIAGNOSIS — Z171 Estrogen receptor negative status [ER-]: Secondary | ICD-10-CM

## 2018-09-30 DIAGNOSIS — Z803 Family history of malignant neoplasm of breast: Secondary | ICD-10-CM | POA: Insufficient documentation

## 2018-09-30 DIAGNOSIS — F339 Major depressive disorder, recurrent, unspecified: Secondary | ICD-10-CM | POA: Diagnosis not present

## 2018-09-30 DIAGNOSIS — Z1239 Encounter for other screening for malignant neoplasm of breast: Secondary | ICD-10-CM | POA: Diagnosis not present

## 2018-09-30 DIAGNOSIS — Z01419 Encounter for gynecological examination (general) (routine) without abnormal findings: Secondary | ICD-10-CM

## 2018-09-30 DIAGNOSIS — Z9221 Personal history of antineoplastic chemotherapy: Secondary | ICD-10-CM | POA: Diagnosis not present

## 2018-09-30 DIAGNOSIS — Z87891 Personal history of nicotine dependence: Secondary | ICD-10-CM

## 2018-09-30 DIAGNOSIS — E538 Deficiency of other specified B group vitamins: Secondary | ICD-10-CM | POA: Diagnosis not present

## 2018-09-30 DIAGNOSIS — M797 Fibromyalgia: Secondary | ICD-10-CM

## 2018-09-30 DIAGNOSIS — K219 Gastro-esophageal reflux disease without esophagitis: Secondary | ICD-10-CM

## 2018-09-30 DIAGNOSIS — Z853 Personal history of malignant neoplasm of breast: Secondary | ICD-10-CM

## 2018-09-30 DIAGNOSIS — C50412 Malignant neoplasm of upper-outer quadrant of left female breast: Secondary | ICD-10-CM

## 2018-09-30 MED ORDER — AMLODIPINE BESYLATE 10 MG PO TABS: 10.0000 mg | ORAL_TABLET | Freq: Every day | ORAL | 0 refills | Status: DC

## 2018-09-30 MED ORDER — CHLORTHALIDONE 25 MG PO TABS: 12.5000 mg | ORAL_TABLET | Freq: Every day | ORAL | 0 refills | Status: DC

## 2018-09-30 MED ORDER — CYANOCOBALAMIN 1000 MCG/ML IJ SOLN
1000.0000 ug | Freq: Once | INTRAMUSCULAR | Status: AC
  Administered 2018-09-30: 1000 ug via INTRAMUSCULAR

## 2018-09-30 NOTE — Progress Notes (Signed)
Patient had physical today.  Got her flu shot.  Will start chlorthalidone tomorrow 12.5 daily. No concerns regarding her breast cancer.

## 2018-09-30 NOTE — Progress Notes (Signed)
Name: Deanna Baker   MRN: [id number]    DOB: 08/14/1961   Date:09/30/2018       Progress Note  Subjective  Chief Complaint  Chief Complaint  Patient presents with  . Annual Exam    HPI   Patient presents for annual CPE and follow up  HTN: seen by Benjamine Mola a couple of weeks ago, bp was up, she was having chest congestion and cough, Benicar HCTZ was stopped and she is now on Norvasc 10 mg, bp still a little elevated, she thought only needed to take chlorthalidone if she developed swelling, explained that it is a diuretic that works for bp, but to take half pill only in am and Norvasc at night to get bp at goal. No chest pain or palpitation.   FMS: she continues to have daily pain, she stopped all her medications a few months ago, no longer on gabapentin, hydrocodone and zofran. Pain is now 3/10 and is okay.   CFIDS: she states she has been feeling better, B12 has helped and also coming off gabapentin, however still has post-activity fatigue and needs to nap at times. She also takes breaks during the day to avoid exhaustion.   Reflux esophagitis: choking and also esophageal thickenes on CT chest/abdomen. Seen by Dr. Durwin Reges she had an EGD and esophageal dilation, and was diagnosed with reflux esophagitis. She is only taking Dexilant prn now, states feeling much better.   Major Depression: she wastaking Paxil and Wellbutrin, but stopped this Summer and states feeling better, fatigue is stable, no crying spells, suicidal thoughts or ideation   OSA: she is wearing CPAP every night, all night. Unchanged   Dyslipidemia: she refuses statin therapy, last lipid panel reviewed with patient and it did not look good, discussed life style modification, including eating more fish and tree nuts and try to exercise more often   Morbid obesity: lost 9 lbs over the Summer and weight is stable now, trying to eat healthy.   Diet: she eats balanced , likes fruit and vegetables and cooks mostly at  home Exercise: walking her dog about 5 days a week for 30 minutes      Office Visit from 09/30/2018 in [facility]  AUDIT-C Score  1     Depression:  Depression screen [facility] 2/9 09/30/2018 09/08/2018 07/06/2018 04/05/2018 04/05/2018  Decreased Interest 0 0 2 1 0  Down, Depressed, Hopeless 0 0 1 1 0  PHQ - 2 Score 0 0 3 2 0  Altered sleeping 0 - 1 0 0  Tired, decreased energy 1 - 2 3 0  Change in appetite 0 - 1 0 0  Feeling bad or failure about yourself  0 - 1 0 0  Trouble concentrating 0 - '1 1 1  '$ Moving slowly or fidgety/restless 0 - 2 2 0  Suicidal thoughts 0 - 0 0 0  PHQ-9 Score 1 - '11 8 1  '$ Difficult doing work/chores Not difficult at all - Very difficult Very difficult Not difficult at all   Hypertension: BP Readings from Last 3 Encounters:  09/30/18 (!) 142/88  09/15/18 (!) 160/90  09/08/18 (!) 142/80   Obesity: Wt Readings from Last 3 Encounters:  09/30/18 188 lb 6.4 oz (85.5 kg)  09/15/18 188 lb 4.8 oz (85.4 kg)  09/08/18 190 lb 9.6 oz (86.5 kg)   BMI Readings from Last 3 Encounters:  09/30/18 36.79 kg/m  09/15/18 36.47 kg/m  09/08/18 36.92 kg/m    Hep C Screening: up  to date STD testing and prevention (HIV/chl/gon/syphilis): N/A Intimate partner violence: negative screen  Sexual History/Pain during Intercourse: not recently Menstrual History/LMP/Abnormal Bleeding: no vaginal bleeding, discussed post-menopausal bleeding  Incontinence Symptoms: very mild and intermittent symptoms   Advanced Care Planning: A voluntary discussion about advance care planning including the explanation and discussion of advance directives.  Discussed health care proxy and Living will, and the patient was able to identify a health care proxy as husband.   Patient does have a living will at present time.  Breast cancer: we will order it today  BRCA gene screening: N/A, history of breast cancer and lumpectomy  Cervical cancer screening: we will repeat in 3 years    Osteoporosis Screening:  No results found for: HMDEXASCAN  Lipids:  Lab Results  Component Value Date   CHOL 247 (H) 09/27/2018   CHOL 237 (H) 07/03/2017   CHOL 212 (H) 07/09/2016   Lab Results  Component Value Date   HDL 56 09/27/2018   HDL 46 (L) 07/03/2017   HDL 59 07/09/2016   Lab Results  Component Value Date   LDLCALC 160 (H) 09/27/2018   LDLCALC 153 (H) 07/03/2017   LDLCALC 131 (H) 07/09/2016   Lab Results  Component Value Date   TRIG 163 (H) 09/27/2018   TRIG 190 (H) 07/03/2017   TRIG 109 07/09/2016   Lab Results  Component Value Date   CHOLHDL 4.4 09/27/2018   CHOLHDL 5.2 (H) 07/03/2017   CHOLHDL 3.6 07/09/2016   No results found for: LDLDIRECT  Glucose:  Glucose  Date Value Ref Range Status  02/20/2015 113 (H) mg/dL Final    Comment:    65-99 NOTE: New Reference Range  01/30/15   08/22/2014 94 65 - 99 mg/dL Final  02/14/2014 117 (H) 65 - 99 mg/dL Final   Glucose, Bld  Date Value Ref Range Status  09/27/2018 116 (H) 65 - 99 mg/dL Final    Comment:    .            Fasting reference interval . For someone without known diabetes, a glucose value between 100 and 125 mg/dL is consistent with prediabetes and should be confirmed with a follow-up test. .   03/29/2018 133 (H) 65 - 99 mg/dL Final  09/21/2017 102 (H) 65 - 99 mg/dL Final   Glucose-Capillary  Date Value Ref Range Status  08/16/2015 124 (H) 65 - 99 mg/dL Final    Skin cancer: discussed atypical lesions  Colorectal cancer: up to date  Lung cancer:   Low Dose CT Chest recommended if Age 15-80 years, 30 pack-year currently smoking OR have quit w/in 15years. Patient does not qualify.   ECG: today   Patient Active Problem List   Diagnosis Date Noted  . Anemia 04/04/2018  . B12 deficiency 03/29/2018  . Cognitive decline 01/02/2017  . Chronic kidney disease, stage 3 ([place]) 09/23/2016  . Sleep apnea 07/11/2016  . ASCUS favor benign 07/10/2016  . Memory loss 05/12/2016  . Smell  or taste sensation disturbance 05/12/2016  . Personal history of colonic polyps   . Benign neoplasm of transverse colon   . Rectal polyp   . Tenosynovitis of thumb 03/12/2016  . Bilateral knee pain 03/12/2016  . Reflux esophagitis   . Stricture and stenosis of esophagus   . Abnormal findings-gastrointestinal tract   . Thickening of esophagus 08/03/2015  . Choking 07/04/2015  . Benign essential HTN 05/01/2015  . CFIDS (chronic fatigue and immune dysfunction syndrome) ([place]) 05/01/2015  .  Major depression, recurrent, chronic ([place]) 05/01/2015  . Dyslipidemia 05/01/2015  . Fibromyalgia syndrome 05/01/2015  . Blood glucose elevated 05/01/2015  . Eczema intertrigo 05/01/2015  . Iron deficiency anemia due to chronic blood loss 05/01/2015  . Migraine without aura and without status migrainosus, not intractable 05/01/2015  . Excessive urination at night 05/01/2015  . Dysmetabolic syndrome 44/31/5400  . History of colonoscopy with polypectomy 05/01/2015  . Central sleep apnea 05/01/2015  . History of back surgery 05/01/2015  . Thickened nails 05/01/2015  . Malignant neoplasm of upper-outer quadrant of female breast, triple negative 06/13/2013    Past Surgical History:  Procedure Laterality Date  . BACK SURGERY    . BREAST BIOPSY Left 10/2012   +  . 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 BREAST LUMPECTOMY Left 12/06/12    lumpectomy with SN biopsy and power port placement...  . COLONOSCOPY  2013   Dr. Suzette Battiest   . COLONOSCOPY WITH PROPOFOL N/A 04/28/2016   Procedure: COLONOSCOPY WITH PROPOFOL;  Surgeon: Lucilla Lame, MD;  Location: [place];  Service: Endoscopy;  Laterality: N/A;  CPAP  . ESOPHAGOGASTRODUODENOSCOPY (EGD) WITH PROPOFOL N/A 10/23/2015   Procedure: ESOPHAGOGASTRODUODENOSCOPY (EGD) WITH PROPOFOL;  Surgeon: Lucilla Lame, MD;  Location: [facility] ENDOSCOPY;  Service: Endoscopy;  Laterality: N/A;  . POLYPECTOMY  04/28/2016   Procedure:  POLYPECTOMY;  Surgeon: Lucilla Lame, MD;  Location: [place];  Service: Endoscopy;;  . SHOULDER SURGERY Left 2009  . TONSILLECTOMY  1965  . TUBAL LIGATION  1982  . UPPER GI ENDOSCOPY     Dr. Suzette Battiest    Family History  Problem Relation Age of Onset  . Breast cancer Mother 60  . Diabetes Mother   . Gout Mother   . Kidney failure Mother   . Hypertension Mother   . Congestive Heart Failure Mother   . Breast cancer Maternal Aunt   . Stomach cancer Maternal Aunt   . Brain cancer Paternal Uncle        x 2 mat uncles  . Heart attack Maternal Grandmother   . Hypertension Maternal Grandmother   . Stroke Maternal Grandmother   . Epilepsy Maternal Aunt     Social History   Socioeconomic History  . Marital status: Married    Spouse name: Christia Reading  . Number of children: 1  . Years of education: Not on file  . Highest education level: GED or equivalent  Occupational History  . Occupation: Unemployed  Social Needs  . Financial resource strain: Not hard at all  . Food insecurity:    Worry: Never true    Inability: Never true  . Transportation needs:    Medical: No    Non-medical: No  Tobacco Use  . Smoking status: Former Smoker    Packs/day: 0.50    Years: 4.00    Pack years: 2.00    Types: Cigarettes    Start date: 11/24/1976    Last attempt to quit: 11/24/1980    Years since quitting: 37.8  . Smokeless tobacco: Never Used  Substance and Sexual Activity  . Alcohol use: Yes    Alcohol/week: 0.0 standard drinks    Comment: rarely, 1-2x/mo glass of wine  . Drug use: No  . Sexual activity: Yes    Partners: Male  Lifestyle  . Physical activity:    Days per week: 5 days    Minutes per session: 40 min  . Stress: Only a little  Relationships  .  Social connections:    Talks on phone: More than three times a week    Gets together: More than three times a week    Attends religious service: More than 4 times per year    Active member of club or organization: Yes     Attends meetings of clubs or organizations: More than 4 times per year    Relationship status: Married  . Intimate partner violence:    Fear of current or ex partner: No    Emotionally abused: No    Physically abused: No    Forced sexual activity: No  Other Topics Concern  . Not on file  Social History Narrative  . Not on file     Current Outpatient Medications:  .  ALPRAZolam (XANAX) 0.5 MG tablet, TAKE 1 TABLET BY MOUTH TWICE A DAY AS NEEDED FOR ANXIETY, Disp: 30 tablet, Rfl: 0 .  amLODipine (NORVASC) 10 MG tablet, Take 1 tablet (10 mg total) by mouth daily. At night, Disp: 90 tablet, Rfl: 0 .  DEXILANT 60 MG capsule, TAKE 1 CAPSULE BY MOUTH EVERY DAY (Patient taking differently: Take 60 mg by mouth as needed. ), Disp: 90 capsule, Rfl: 1 .  Nerve Stimulator (STANDARD TENS) DEVI, 1 Units by Does not apply route daily., Disp: 1 Device, Rfl: 0 .  chlorthalidone (HYGROTON) 25 MG tablet, Take 0.5 tablets (12.5 mg total) by mouth daily. In am, Disp: 45 tablet, Rfl: 0 .  docusate sodium (COLACE) 100 MG capsule, Take 100 mg by mouth 2 (two) times daily., Disp: , Rfl:   Current Facility-Administered Medications:  .  cyanocobalamin ((VITAMIN B-12)) injection 1,000 mcg, 1,000 mcg, Intramuscular, Once, Steele Sizer, MD  Allergies  Allergen Reactions  . Ace Inhibitors Swelling  . Benicar [Olmesartan] Other (See Comments)    Chest Pressure and Allergic Reaction  . Pregabalin     weight gain and blurred vision  . Venlafaxine     foggy minded     ROS  Constitutional: Negative for fever or weight change.  Respiratory: Negative for cough and shortness of breath.   Cardiovascular: Negative for chest pain or palpitations.  Gastrointestinal: Negative for abdominal pain, no bowel changes.  Musculoskeletal: Negative for gait problem or joint swelling.  Skin: Negative for rash.  Neurological: Negative for dizziness or headache.  No other specific complaints in a complete review of systems  (except as listed in HPI above).   Objective  Vitals:   09/30/18 0820  BP: (!) 142/88  Pulse: 80  Resp: 18  Temp: 98 F (36.7 C)  TempSrc: Oral  SpO2: 99%  Weight: 188 lb 6.4 oz (85.5 kg)  Height: 5' (1.524 m)    Body mass index is 36.79 kg/m.  Physical Exam  Constitutional: Patient appears well-developed and well-nourished. No distress.  HENT: Head: Normocephalic and atraumatic. Ears: B TMs ok, no erythema or effusion; Nose: Nose normal. Mouth/Throat: Oropharynx is clear and moist. No oropharyngeal exudate.  Eyes: Conjunctivae and EOM are normal. Pupils are equal, round, and reactive to light. No scleral icterus.  Neck: Normal range of motion. Neck supple. No JVD present. No thyromegaly present.  Cardiovascular: Normal rate, regular rhythm and normal heart sounds.  No murmur heard. No BLE edema. Pulmonary/Chest: Effort normal and breath sounds normal. No respiratory distress. Abdominal: Soft. Bowel sounds are normal, no distension. There is no tenderness. no masses Breast: no lumps or masses, no nipple discharge or rashes FEMALE GENITALIA: * External genitalia normal External urethra normal Speculum exam not  done Bimanual exam normal without masses RECTAL: normal external exam Musculoskeletal: Normal range of motion, no joint effusions. No gross deformities. Trigger point positive  Neurological: he is alert and oriented to person, place, and time. No cranial nerve deficit. Coordination, balance, strength, speech and gait are normal.  Skin: Skin is warm and dry. No rash noted. No erythema.  Psychiatric: Patient has a normal mood and affect. behavior is normal. Judgment and thought content normal.   Recent Results (from the past 2160 hour(s))  CBC with Differential/Platelet     Status: None   Collection Time: 09/27/18  8:11 AM  Result Value Ref Range   WBC 5.2 3.8 - 10.8 Thousand/uL   RBC 4.37 3.80 - 5.10 Million/uL   Hemoglobin 12.8 11.7 - 15.5 g/dL   HCT 38.4 35.0 -  45.0 %   MCV 87.9 80.0 - 100.0 fL   MCH 29.3 27.0 - 33.0 pg   MCHC 33.3 32.0 - 36.0 g/dL   RDW 13.0 11.0 - 15.0 %   Platelets 233 140 - 400 Thousand/uL   MPV 10.3 7.5 - 12.5 fL   Neutro Abs 3,650 1,500 - 7,800 cells/uL   Lymphs Abs 1,128 850 - 3,900 cells/uL   WBC mixed population 270 200 - 950 cells/uL   Eosinophils Absolute 140 15 - 500 cells/uL   Basophils Absolute 10 0 - 200 cells/uL   Neutrophils Relative % 70.2 %   Total Lymphocyte 21.7 %   Monocytes Relative 5.2 %   Eosinophils Relative 2.7 %   Basophils Relative 0.2 %  COMPLETE METABOLIC PANEL WITH GFR     Status: Abnormal   Collection Time: 09/27/18  8:11 AM  Result Value Ref Range   Glucose, Bld 116 (H) 65 - 99 mg/dL    Comment: .            Fasting reference interval . For someone without known diabetes, a glucose value between 100 and 125 mg/dL is consistent with prediabetes and should be confirmed with a follow-up test. .    BUN 13 7 - 25 mg/dL   Creat 0.87 0.50 - 1.05 mg/dL    Comment: For patients >48 years of age, the reference limit for Creatinine is approximately 13% higher for people identified as African-American. .    GFR, Est Non African American 74 > OR = 60 mL/min/1.57m   GFR, Est African American 86 > OR = 60 mL/min/1.753m  BUN/Creatinine Ratio NOT APPLICABLE 6 - 22 (calc)   Sodium 141 135 - 146 mmol/L   Potassium 4.1 3.5 - 5.3 mmol/L   Chloride 106 98 - 110 mmol/L   CO2 28 20 - 32 mmol/L   Calcium 9.5 8.6 - 10.4 mg/dL   Total Protein 6.5 6.1 - 8.1 g/dL   Albumin 4.2 3.6 - 5.1 g/dL   Globulin 2.3 1.9 - 3.7 g/dL (calc)   AG Ratio 1.8 1.0 - 2.5 (calc)   Total Bilirubin 0.5 0.2 - 1.2 mg/dL   Alkaline phosphatase (APISO) 113 33 - 130 U/L   AST 17 10 - 35 U/L   ALT 16 6 - 29 U/L  Vitamin B12     Status: Abnormal   Collection Time: 09/27/18  8:11 AM  Result Value Ref Range   Vitamin B-12 1,142 (H) 200 - 1,100 pg/mL  Lipid panel     Status: Abnormal   Collection Time: 09/27/18  8:11 AM   Result Value Ref Range   Cholesterol 247 (H) <200 mg/dL  HDL 56 >50 mg/dL   Triglycerides 163 (H) <150 mg/dL   LDL Cholesterol (Calc) 160 (H) mg/dL (calc)    Comment: Reference range: <100 . Desirable range <100 mg/dL for primary prevention;   <70 mg/dL for patients with CHD or diabetic patients  with > or = 2 CHD risk factors. Marland Kitchen LDL-C is now calculated using the Martin-Hopkins  calculation, which is a validated novel method providing  better accuracy than the Friedewald equation in the  estimation of LDL-C.  Cresenciano Genre et al. Annamaria Helling. 6967;893(81): 2061-2068  (http://education.[employer].com/faq/FAQ164)    Total CHOL/HDL Ratio 4.4 <5.0 (calc)   Non-HDL Cholesterol (Calc) 191 (H) <130 mg/dL (calc)    Comment: For patients with diabetes plus 1 major ASCVD risk  factor, treating to a non-HDL-C goal of <100 mg/dL  (LDL-C of <70 mg/dL) is considered a therapeutic  option.   Iron, TIBC and Ferritin Panel     Status: None   Collection Time: 09/27/18  8:11 AM  Result Value Ref Range   Iron 88 45 - 160 mcg/dL   TIBC 318 250 - 450 mcg/dL (calc)   %SAT 28 16 - 45 % (calc)   Ferritin 68 16 - 232 ng/mL  Hemoglobin A1c     Status: Abnormal   Collection Time: 09/27/18  8:11 AM  Result Value Ref Range   Hgb A1c MFr Bld 5.8 (H) <5.7 % of total Hgb    Comment: For someone without known diabetes, a hemoglobin  A1c value between 5.7% and 6.4% is consistent with prediabetes and should be confirmed with a  follow-up test. . For someone with known diabetes, a value <7% indicates that their diabetes is well controlled. A1c targets should be individualized based on duration of diabetes, age, comorbid conditions, and other considerations. . This assay result is consistent with an increased risk of diabetes. . Currently, no consensus exists regarding use of hemoglobin A1c for diagnosis of diabetes for children. .    Mean Plasma Glucose 120 (calc)   eAG (mmol/L) 6.6 (calc)  Cancer  antigen 27.29     Status: None   Collection Time: 09/27/18  8:11 AM  Result Value Ref Range   CA 27.29 21 <38 U/mL    Comment: . This test was performed using the Siemens Chemiluminescent method. Values obtained from different assay methods cannot be used interchangeably. CA 27.29 levels, regardless of value, should not be interpreted as absolute evidence of the presence or absence of disease. .      PHQ2/9: Depression screen [facility] 2/9 09/30/2018 09/08/2018 07/06/2018 04/05/2018 04/05/2018  Decreased Interest 0 0 2 1 0  Down, Depressed, Hopeless 0 0 1 1 0  PHQ - 2 Score 0 0 3 2 0  Altered sleeping 0 - 1 0 0  Tired, decreased energy 1 - 2 3 0  Change in appetite 0 - 1 0 0  Feeling bad or failure about yourself  0 - 1 0 0  Trouble concentrating 0 - '1 1 1  '$ Moving slowly or fidgety/restless 0 - 2 2 0  Suicidal thoughts 0 - 0 0 0  PHQ-9 Score 1 - '11 8 1  '$ Difficult doing work/chores Not difficult at all - Very difficult Very difficult Not difficult at all     Fall Risk: Fall Risk  09/30/2018 09/15/2018 09/08/2018 01/01/2018 10/30/2017  Falls in the past year? 1 No No No No  Comment - - - - -  Number falls in past yr: 1 - - - -  Comment - - - - -  Injury with Fall? 0 - - - -  Risk for fall due to : Other (Comment) - - - -  Risk for fall due to: Comment She trips easily - - - -    Assessment & Plan  1. Well woman exam   2. Breast cancer screening  -mammogram   3. Benign essential HTN  - amLODipine (NORVASC) 10 MG tablet; Take 1 tablet (10 mg total) by mouth daily. At night  Dispense: 90 tablet; Refill: 0 - chlorthalidone (HYGROTON) 25 MG tablet; Take 0.5 tablets (12.5 mg total) by mouth daily. In am  Dispense: 45 tablet; Refill: 0  4. B12 deficiency  - cyanocobalamin ((VITAMIN B-12)) injection 1,000 mcg  5. Fibromyalgia syndrome   6. Major depression, recurrent, chronic (HCC)  Off medication, in remission   7. Gastro-esophageal reflux disease without  esophagitis    -USPSTF grade A and B recommendations reviewed with patient; age-appropriate recommendations, preventive care, screening tests, etc discussed and encouraged; healthy living encouraged; see AVS for patient education given to patient -Discussed importance of 150 minutes of physical activity weekly, eat two servings of fish weekly, eat one serving of tree nuts ( cashews, pistachios, pecans, almonds.Marland Kitchen) every other day, eat 6 servings of fruit/vegetables daily and drink plenty of water and avoid sweet beverages.

## 2018-09-30 NOTE — Progress Notes (Signed)
[facility] day:  09/30/18  Chief Complaint: Deanna Baker is a 57 y.o. female with stage IA triple negative left breast cancer who is seen for 6 month assessment.  HPI:  The patient was last seen in the medical oncology clinic on 03/29/2018.  At that time, she denied any breast concerns.  She complained of pain "all over" secondary to fibromyalgia. Exam was stable.  She had mild anemia (new).  Hemoglobin was 11.7, hematocrit 33.8, MCV 89.3, and platelets 222,000.  CA27.29 was normal.  Ferritin was 51.  Iron saturation was 16% with a TIBC of 323.  B12 was 209.  Folate was 14.1.  She received B12 weekly x 6 (04/05/2018 - 05/17/2018) then monthly (last 09/30/2018). B12 was started.  Labs on 09/27/2018 revealed a hematocrit of 38.4, hemoglobin 12.8, MCV 87.9, platelets 233,000, WBC 5200 with an ANC of 3650.  CMP revealed a creatinine 0.87 and normal LFTs.  B12 was 1142.  Ferritin was 68.  Iron saturation 28% with a TIBC of 318.  CA27.29 was 21.  During the interim, patient has been really tired. Her fibromyalgia is "acting up" today. Patient denies that she has experienced any B symptoms. She denies any interval infections.  Patient does not verbalize any significant concerns with regards to her breasts today. She has some tenderness in her LEFT breast with the weather change. She states, "I guess my breast is my barometer now". Patient performs monthly self breast examinations as recommended.     Memory has improved with the discontinuation of gabapentin. She notes that her fibromyalgia "has been good" lately, unless she overdoes things. She does not currently take any pharmacological interventions for her fibromyalgia. Since discontinuing the gabapentin, patient notes that she does not have as much trouble "getting her words out".   Patient advises that she maintains an adequate appetite. She is eating well. Weight today is 188 lb 9 oz (85.5 kg), which  compared to her last visit to the clinic, represents a 6 pound decrease.    Patient complained of generalized pain rated 5/10 in the clinic today.   Past Medical History:  Diagnosis Date  . Anemia   . Arthritis 1990  . Bronchitis 5/17  . Chronic fatigue syndrome   . Colon polyp 2013  . Fibromyalgia 1993  . GERD (gastroesophageal reflux disease)   . Heart murmur   . Hypertension 2012  . Lump or mass in breast   . Malignant neoplasm of upper-outer quadrant of female breast ([place])    Invasive mammary carcinoma, triple negative  . Measles   . Mumps 1973  . MVP (mitral valve prolapse)   . Obstructive sleep apnea on CPAP 11/27/15  . Personal history of radiation therapy    left  . Shortness of breath dyspnea    chronic fatigue/fibromyalgia    Past Surgical History:  Procedure Laterality Date  . BACK SURGERY    . BREAST BIOPSY Left 10/2012   +  . 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 BREAST LUMPECTOMY Left 12/06/12    lumpectomy with SN biopsy and power port placement...  . COLONOSCOPY  2013   Dr. Suzette Battiest   . COLONOSCOPY WITH PROPOFOL N/A 04/28/2016   Procedure: COLONOSCOPY WITH PROPOFOL;  Surgeon: Lucilla Lame, MD;  Location: [facility];  Service: Endoscopy;  Laterality: N/A;  CPAP  . ESOPHAGOGASTRODUODENOSCOPY (EGD) WITH PROPOFOL N/A 10/23/2015   Procedure: ESOPHAGOGASTRODUODENOSCOPY (EGD) WITH  PROPOFOL;  Surgeon: Lucilla Lame, MD;  Location: [facility] ENDOSCOPY;  Service: Endoscopy;  Laterality: N/A;  . POLYPECTOMY  04/28/2016   Procedure: POLYPECTOMY;  Surgeon: Lucilla Lame, MD;  Location: [facility];  Service: Endoscopy;;  . SHOULDER SURGERY Left 2009  . TONSILLECTOMY  1965  . TUBAL LIGATION  1982  . UPPER GI ENDOSCOPY     Dr. Suzette Battiest    Family History  Problem Relation Age of Onset  . Breast cancer Mother 41  . Diabetes Mother   . Gout Mother   . Kidney failure Mother   . Hypertension Mother   . Congestive Heart Failure  Mother   . Breast cancer Maternal Aunt   . Stomach cancer Maternal Aunt   . Brain cancer Paternal Uncle        x 2 mat uncles  . Heart attack Maternal Grandmother   . Hypertension Maternal Grandmother   . Stroke Maternal Grandmother   . Epilepsy Maternal Aunt     Social History:  reports that she quit smoking about 37 years ago. Her smoking use included cigarettes. She started smoking about 41 years ago. She has a 2.00 pack-year smoking history. She has never used smokeless tobacco. She reports that she drinks alcohol. She reports that she does not use drugs.  She has a 58 year old healthy daughter.  She lives in [place]. The patient is alone today.  Allergies:  Allergies  Allergen Reactions  . Ace Inhibitors Swelling  . Benicar [Olmesartan] Other (See Comments)    Chest Pressure and Allergic Reaction  . Pregabalin     weight gain and blurred vision  . Venlafaxine     foggy minded    Current Medications: Current Outpatient Medications  Medication Sig Dispense Refill  . ALPRAZolam (XANAX) 0.5 MG tablet TAKE 1 TABLET BY MOUTH TWICE A DAY AS NEEDED FOR ANXIETY 30 tablet 0  . amLODipine (NORVASC) 10 MG tablet Take 1 tablet (10 mg total) by mouth daily. At night 90 tablet 0  . chlorthalidone (HYGROTON) 25 MG tablet Take 0.5 tablets (12.5 mg total) by mouth daily. In am 45 tablet 0  . DEXILANT 60 MG capsule TAKE 1 CAPSULE BY MOUTH EVERY DAY (Patient taking differently: Take 60 mg by mouth as needed. ) 90 capsule 1  . Nerve Stimulator (STANDARD TENS) DEVI 1 Units by Does not apply route daily. 1 Device 0  . docusate sodium (COLACE) 100 MG capsule Take 100 mg by mouth 2 (two) times daily.     No current facility-administered medications for this visit.     Review of Systems  Constitutional: Positive for malaise/fatigue (chronic) and weight loss (down 6 pounds). Negative for diaphoresis and fever.  HENT: Negative.   Eyes: Negative.   Respiratory: Negative for cough, hemoptysis, sputum  production and shortness of breath.        PMH (+) for OSAH syndrome - uses nocturnal PAP therapy.   Cardiovascular: Negative for chest pain, palpitations, orthopnea, leg swelling and PND.  Gastrointestinal: Positive for heartburn (on Dexilant). Negative for abdominal pain, blood in stool, constipation, diarrhea, melena, nausea and vomiting.  Genitourinary: Negative for dysuria, frequency, hematuria and urgency.  Musculoskeletal: Negative for back pain, falls, joint pain and myalgias.       PMH (+) for fibromyalgia  Skin: Negative for itching and rash.  Neurological: Negative for dizziness, tremors, weakness and headaches.  Endo/Heme/Allergies: Does not bruise/bleed easily.  Psychiatric/Behavioral: Negative for depression, memory loss and suicidal ideas. The patient is not nervous/anxious and  does not have insomnia.        Memory issues improved with the discontinuation of gabapentin.  All other systems reviewed and are negative.  Performance status (ECOG): 1 - Symptomatic but completely ambulatory  Vital Signs BP 139/83 (BP Location: Right Arm, Patient Position: Sitting)   Pulse 60   Temp 98.2 F (36.8 C) (Tympanic)   Resp 18   Wt 188 lb 9 oz (85.5 kg)   BMI 36.83 kg/m   Physical Exam  Constitutional: She is oriented to person, place, and time and well-developed, well-nourished, and in no distress. No distress.  HENT:  Head: Normocephalic and atraumatic.  Mouth/Throat: Oropharynx is clear and moist and mucous membranes are normal. No oropharyngeal exudate.  Shoulder length brown hair.  Eyes: Pupils are equal, round, and reactive to light. Conjunctivae and EOM are normal. No scleral icterus.  Blue eyes.  Neck: Normal range of motion. Neck supple. No JVD present.  Cardiovascular: Normal rate, regular rhythm, normal heart sounds and intact distal pulses. Exam reveals no gallop and no friction rub.  No murmur heard. Pulmonary/Chest: Effort normal and breath sounds normal. No  respiratory distress. She has no wheezes. She has no rales. Right breast exhibits no inverted nipple, no mass, no nipple discharge, no skin change and no tenderness. Left breast exhibits skin change (s/p lumpectomy; mild radiation changes and edema inferiorly). Left breast exhibits no inverted nipple, no mass and no nipple discharge.  Abdominal: Soft. Bowel sounds are normal. She exhibits no distension and no mass. There is no abdominal tenderness. There is no rebound and no guarding.  Musculoskeletal: Normal range of motion. She exhibits no edema or tenderness.  Lymphadenopathy:    She has no cervical adenopathy.    She has no axillary adenopathy.       Right: No inguinal and no supraclavicular adenopathy present.       Left: No inguinal and no supraclavicular adenopathy present.  Neurological: She is alert and oriented to person, place, and time.  Skin: Skin is warm and dry. No rash noted. She is not diaphoretic. No erythema.  Psychiatric: Mood, affect and judgment normal.  Nursing note and vitals reviewed.   No visits with results within 3 Day(s) from this visit.  Latest known visit with results is:  Office Visit on 07/06/2018  Component Date Value Ref Range Status  . WBC 09/27/2018 5.2  3.8 - 10.8 Thousand/uL Final  . RBC 09/27/2018 4.37  3.80 - 5.10 Million/uL Final  . Hemoglobin 09/27/2018 12.8  11.7 - 15.5 g/dL Final  . HCT 09/27/2018 38.4  35.0 - 45.0 % Final  . MCV 09/27/2018 87.9  80.0 - 100.0 fL Final  . MCH 09/27/2018 29.3  27.0 - 33.0 pg Final  . MCHC 09/27/2018 33.3  32.0 - 36.0 g/dL Final  . RDW 09/27/2018 13.0  11.0 - 15.0 % Final  . Platelets 09/27/2018 233  140 - 400 Thousand/uL Final  . MPV 09/27/2018 10.3  7.5 - 12.5 fL Final  . Neutro Abs 09/27/2018 3,650  1,500 - 7,800 cells/uL Final  . Lymphs Abs 09/27/2018 1,128  850 - 3,900 cells/uL Final  . WBC mixed population 09/27/2018 270  200 - 950 cells/uL Final  . Eosinophils Absolute 09/27/2018 140  15 - 500 cells/uL  Final  . Basophils Absolute 09/27/2018 10  0 - 200 cells/uL Final  . Neutrophils Relative % 09/27/2018 70.2  % Final  . Total Lymphocyte 09/27/2018 21.7  % Final  . Monocytes Relative 09/27/2018 5.2  %  Final  . Eosinophils Relative 09/27/2018 2.7  % Final  . Basophils Relative 09/27/2018 0.2  % Final  . Glucose, Bld 09/27/2018 116* 65 - 99 mg/dL Final   Comment: .            Fasting reference interval . For someone without known diabetes, a glucose value between 100 and 125 mg/dL is consistent with prediabetes and should be confirmed with a follow-up test. .   . BUN 09/27/2018 13  7 - 25 mg/dL Final  . Creat 09/27/2018 0.87  0.50 - 1.05 mg/dL Final   Comment: For patients >81 years of age, the reference limit for Creatinine is approximately 13% higher for people identified as African-American. .   . GFR, Est Non African American 09/27/2018 74  > OR = 60 mL/min/1.35m Final  . GFR, Est African American 09/27/2018 86  > OR = 60 mL/min/1.758mFinal  . BUN/Creatinine Ratio 1124/23/5361OT APPLICABLE  6 - 22 (calc) Final  . Sodium 09/27/2018 141  135 - 146 mmol/L Final  . Potassium 09/27/2018 4.1  3.5 - 5.3 mmol/L Final  . Chloride 09/27/2018 106  98 - 110 mmol/L Final  . CO2 09/27/2018 28  20 - 32 mmol/L Final  . Calcium 09/27/2018 9.5  8.6 - 10.4 mg/dL Final  . Total Protein 09/27/2018 6.5  6.1 - 8.1 g/dL Final  . Albumin 09/27/2018 4.2  3.6 - 5.1 g/dL Final  . Globulin 09/27/2018 2.3  1.9 - 3.7 g/dL (calc) Final  . AG Ratio 09/27/2018 1.8  1.0 - 2.5 (calc) Final  . Total Bilirubin 09/27/2018 0.5  0.2 - 1.2 mg/dL Final  . Alkaline phosphatase (APISO) 09/27/2018 113  33 - 130 U/L Final  . AST 09/27/2018 17  10 - 35 U/L Final  . ALT 09/27/2018 16  6 - 29 U/L Final  . Vitamin B-12 09/27/2018 1,142* 200 - 1,100 pg/mL Final  . Cholesterol 09/27/2018 247* <200 mg/dL Final  . HDL 09/27/2018 56  >50 mg/dL Final  . Triglycerides 09/27/2018 163* <150 mg/dL Final  . LDL Cholesterol  (Calc) 09/27/2018 160* mg/dL (calc) Final   Comment: Reference range: <100 . Desirable range <100 mg/dL for primary prevention;   <70 mg/dL for patients with CHD or diabetic patients  with > or = 2 CHD risk factors. . Marland KitchenDL-C is now calculated using the Martin-Hopkins  calculation, which is a validated novel method providing  better accuracy than the Friedewald equation in the  estimation of LDL-C.  MaCresenciano Genret al. JAAnnamaria [id number];[id number]  (http://education.[employer].com/faq/FAQ164)   . Total CHOL/HDL Ratio 09/27/2018 4.4  <5.0 (calc) Final  . Non-HDL Cholesterol (Calc) 09/27/2018 191* <130 mg/dL (calc) Final   Comment: For patients with diabetes plus 1 major ASCVD risk  factor, treating to a non-HDL-C goal of <100 mg/dL  (LDL-C of <70 mg/dL) is considered a therapeutic  option.   . Iron 09/27/2018 88  45 - 160 mcg/dL Final  . TIBC 09/27/2018 318  250 - 450 mcg/dL (calc) Final  . %SAT 09/27/2018 28  16 - 45 % (calc) Final  . Ferritin 09/27/2018 68  16 - 232 ng/mL Final  . Hgb A1c MFr Bld 09/27/2018 5.8* <5.7 % of total Hgb Final   Comment: For someone without known diabetes, a hemoglobin  A1c value between 5.7% and 6.4% is consistent with prediabetes and should be confirmed with a  follow-up test. . For someone with known diabetes, a value <7% indicates that their diabetes is well controlled.  A1c targets should be individualized based on duration of diabetes, age, comorbid conditions, and other considerations. . This assay result is consistent with an increased risk of diabetes. . Currently, no consensus exists regarding use of hemoglobin A1c for diagnosis of diabetes for children. .   . Mean Plasma Glucose 09/27/2018 120  (calc) Final  . eAG (mmol/L) 09/27/2018 6.6  (calc) Final  . CA 27.29 09/27/2018 21  <38 U/mL Final   Comment: . This test was performed using the Siemens Chemiluminescent method. Values obtained from different assay methods cannot be  used interchangeably. CA 27.29 levels, regardless of value, should not be interpreted as absolute evidence of the presence or absence of disease. .     Assessment:  Deanna Baker is a 57 y.o. female with stage IA triple negative left breast cancer s/p lumpectomy and sentinel lymph node biopsy on 12/06/2012.  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  Tumor was ER, PR and HER-2/neu negative. Sentinel left nodes were negative.  Pathologic stage was T1cN0M0.    BRCA mutation was negative in 11/2012.  She has a family history of breast cancer (mother at age 47 and maternal aunt).  She received Adriamycin and Cytoxan (AC) 4 from 12/17/2011 - 02/17/2013. She received weekly Taxol 12 from 03/10/2013 - 06/02/2013.  She received radiation.   CA27.29 has been monitored:  33.9 on 06/02/2013, 26.1 on 02/14/2014, 23.8 on 08/22/2014, 25.1 on 09/18/2016, 23.4 on 03/19/2017, 21.1 on 09/21/2017, 19.9 on 03/29/2018, and 21 on 09/27/2018.  Bilateral diagnostic mammogram on 12/14/2015 revealed expected density and architectural distortion with dystrophic calcifications in the upper outer quadrant of the left breast stable compared to prior studies. There was left breast skin thickening, stable with post radiation change. There are no suspicious masses, malignant calcifications, sites of nonsurgical architectural distortions or assymmetries in either breast.  Bilateral diagnostic mammogram on 12/17/2016 revealed no evidence of malignancy. Bilateral diagnostic mammogram on 12/21/2017 revealing no mammographic evidence of malignancy in either breast.  She has a history of iron deficiency anemia.  She is on ferrous sulfate 325 mg a day.  Diet is good.  She denies any melena or hematochezia.  Hematocrit has improved from 30.4 on 03/18/2016 to 35.6 on 09/18/2016.     Ferritin has been followed: 39 on 09/18/2016, 36 on 03/19/2017, 34 on 09/21/2017, 64 on 03/10/2018, 51 on  03/29/2018, and 68 on 09/27/2018.  She was diagnosed with B12 deficiency on 03/29/2018.  B12 was 209.  She received B12 weekly x 6 (04/05/2018 - 05/17/2018) then monthly (last 09/03/2018).  B12 was 1142 on 09/27/2018.  Colonoscopy on 04/28/2016 revealed a 3 mm and 7 mm polyp in the transverse colon.  There was a 2 mm polyp in the rectum.  Repeat colonoscopy was recommended in 5 years for surveillance.  EGD on 10/23/2015 revealed benign-appearing esophageal stenosis which was dilated.  She had LA Grade B reflux esophagitis.  Stomach and duodenum was normal.    She has fibromyalgia. Gabapentin caused memory issues and slurred speech, thus it was discontinued.   Symptomatically, she is doing well overall.  She notes that her memory improved with the discontinuation of gabapentin.  She denies any B symptoms.  Complains of intermittent tenderness in her left breast associated with weather changes.  Patient has chronic fibromyalgia.  She is no longer on any interventions.  Exam is grossly unremarkable.  Plan: 1. Review labs from 09/27/2018. 2. Stage IA triple negative left breast  cancer  Doing well overall.  Minimal tenderness in the left breast associated with weather changes.    Bilateral mammogram due 12/21/2018. 3. B12 deficiency  Patient receiving monthly B12  B12 level normal on 09/27/2018.  Check anti-parietal cell antibodies and intrinsic factor antibody with next lab check. 4. Iron deficiency  Labs reviewed.  No anemia or microcytosis.  Ferritin stable at 68 ng/mL.  Iron saturation 28% with a TIBC 318 mcg/dL. 5. RTC in 1 year for MD assessment and labs (CBC with diff, CMP, CA27.29, anti-parietal Ab, intrinsic factor Ab).   Honor Loh, NP  09/30/18, 3:47 PM   I saw and evaluated the patient, participating in the key portions of the service and reviewing pertinent diagnostic studies and records.  I reviewed the nurse practitioner's note and agree with the findings and the plan.   The assessment and plan were discussed with the patient.  Several questions were asked by the patient and answered.   Nolon Stalls, MD 09/30/2018,3:47 PM

## 2018-09-30 NOTE — Patient Instructions (Signed)
Preventive Care 40-64 Years, Female Preventive care refers to lifestyle choices and visits with your health care provider that can promote health and wellness. What does preventive care include?  A yearly physical exam. This is also called an annual well check.  Dental exams once or twice a year.  Routine eye exams. Ask your health care provider how often you should have your eyes checked.  Personal lifestyle choices, including: ? Daily care of your teeth and gums. ? Regular physical activity. ? Eating a healthy diet. ? Avoiding tobacco and drug use. ? Limiting alcohol use. ? Practicing safe sex. ? Taking low-dose aspirin daily starting at age 58. ? Taking vitamin and mineral supplements as recommended by your health care provider. What happens during an annual well check? The services and screenings done by your health care provider during your annual well check will depend on your age, overall health, lifestyle risk factors, and family history of disease. Counseling Your health care provider may ask you questions about your:  Alcohol use.  Tobacco use.  Drug use.  Emotional well-being.  Home and relationship well-being.  Sexual activity.  Eating habits.  Work and work Statistician.  Method of birth control.  Menstrual cycle.  Pregnancy history.  Screening You may have the following tests or measurements:  Height, weight, and BMI.  Blood pressure.  Lipid and cholesterol levels. These may be checked every 5 years, or more frequently if you are over 81 years old.  Skin check.  Lung cancer screening. You may have this screening every year starting at age 78 if you have a 30-pack-year history of smoking and currently smoke or have quit within the past 15 years.  Fecal occult blood test (FOBT) of the stool. You may have this test every year starting at age 65.  Flexible sigmoidoscopy or colonoscopy. You may have a sigmoidoscopy every 5 years or a colonoscopy  every 10 years starting at age 30.  Hepatitis C blood test.  Hepatitis B blood test.  Sexually transmitted disease (STD) testing.  Diabetes screening. This is done by checking your blood sugar (glucose) after you have not eaten for a while (fasting). You may have this done every 1-3 years.  Mammogram. This may be done every 1-2 years. Talk to your health care provider about when you should start having regular mammograms. This may depend on whether you have a family history of breast cancer.  BRCA-related cancer screening. This may be done if you have a family history of breast, ovarian, tubal, or peritoneal cancers.  Pelvic exam and Pap test. This may be done every 3 years starting at age 80. Starting at age 36, this may be done every 5 years if you have a Pap test in combination with an HPV test.  Bone density scan. This is done to screen for osteoporosis. You may have this scan if you are at high risk for osteoporosis.  Discuss your test results, treatment options, and if necessary, the need for more tests with your health care provider. Vaccines Your health care provider may recommend certain vaccines, such as:  Influenza vaccine. This is recommended every year.  Tetanus, diphtheria, and acellular pertussis (Tdap, Td) vaccine. You may need a Td booster every 10 years.  Varicella vaccine. You may need this if you have not been vaccinated.  Zoster vaccine. You may need this after age 5.  Measles, mumps, and rubella (MMR) vaccine. You may need at least one dose of MMR if you were born in  1957 or later. You may also need a second dose.  Pneumococcal 13-valent conjugate (PCV13) vaccine. You may need this if you have certain conditions and were not previously vaccinated.  Pneumococcal polysaccharide (PPSV23) vaccine. You may need one or two doses if you smoke cigarettes or if you have certain conditions.  Meningococcal vaccine. You may need this if you have certain  conditions.  Hepatitis A vaccine. You may need this if you have certain conditions or if you travel or work in places where you may be exposed to hepatitis A.  Hepatitis B vaccine. You may need this if you have certain conditions or if you travel or work in places where you may be exposed to hepatitis B.  Haemophilus influenzae type b (Hib) vaccine. You may need this if you have certain conditions.  Talk to your health care provider about which screenings and vaccines you need and how often you need them. This information is not intended to replace advice given to you by your health care provider. Make sure you discuss any questions you have with your health care provider. Document Released: 12/07/2015 Document Revised: 07/30/2016 Document Reviewed: 09/11/2015 Elsevier Interactive Patient Education  2018 [employer] Inc.  

## 2018-10-01 ENCOUNTER — Other Ambulatory Visit: Payer: Self-pay | Admitting: Nurse Practitioner

## 2018-10-01 DIAGNOSIS — I10 Essential (primary) hypertension: Secondary | ICD-10-CM

## 2018-11-01 ENCOUNTER — Ambulatory Visit: Payer: BLUE CROSS/BLUE SHIELD

## 2018-11-01 VITALS — BP 142/88 | Ht 60.0 in | Wt 188.0 lb

## 2018-11-01 DIAGNOSIS — E538 Deficiency of other specified B group vitamins: Secondary | ICD-10-CM

## 2018-11-01 MED ORDER — CYANOCOBALAMIN 1000 MCG/ML IJ SOLN
1000.0000 ug | Freq: Once | INTRAMUSCULAR | Status: AC
  Administered 2018-11-01: 1000 ug via INTRAMUSCULAR

## 2018-11-01 NOTE — Progress Notes (Signed)
Patient came in for her B-12 injection. She tolerated it well. NKDA.   

## 2018-11-22 ENCOUNTER — Encounter: Payer: Self-pay | Admitting: Hematology and Oncology

## 2018-11-29 ENCOUNTER — Other Ambulatory Visit: Payer: Self-pay | Admitting: Family Medicine

## 2018-11-29 ENCOUNTER — Encounter: Payer: Self-pay | Admitting: Family Medicine

## 2018-11-30 ENCOUNTER — Other Ambulatory Visit: Payer: Self-pay | Admitting: Family Medicine

## 2018-11-30 DIAGNOSIS — Z1239 Encounter for other screening for malignant neoplasm of breast: Secondary | ICD-10-CM

## 2018-11-30 DIAGNOSIS — Z01419 Encounter for gynecological examination (general) (routine) without abnormal findings: Secondary | ICD-10-CM

## 2018-12-02 ENCOUNTER — Ambulatory Visit (INDEPENDENT_AMBULATORY_CARE_PROVIDER_SITE_OTHER): Payer: BLUE CROSS/BLUE SHIELD

## 2018-12-02 DIAGNOSIS — Z9889 Other specified postprocedural states: Secondary | ICD-10-CM | POA: Diagnosis not present

## 2018-12-02 DIAGNOSIS — E538 Deficiency of other specified B group vitamins: Secondary | ICD-10-CM

## 2018-12-02 MED ORDER — CYANOCOBALAMIN 1000 MCG/ML IJ SOLN
1000.0000 ug | INTRAMUSCULAR | Status: AC
  Administered 2018-12-02 – 2019-12-09 (×12): 1000 ug via INTRAMUSCULAR

## 2018-12-02 NOTE — Progress Notes (Signed)
Patient came in for her B-12 injection. She tolerated it well. NKDA.  Patient also need a mammogram ordered.

## 2018-12-22 ENCOUNTER — Ambulatory Visit
Admission: RE | Admit: 2018-12-22 | Discharge: 2018-12-22 | Disposition: A | Discharge status: Home / Self Care | Payer: BLUE CROSS/BLUE SHIELD | Source: Ambulatory Visit | Attending: Family Medicine | Admitting: Family Medicine

## 2018-12-22 DIAGNOSIS — Z1239 Encounter for other screening for malignant neoplasm of breast: Secondary | ICD-10-CM | POA: Diagnosis not present

## 2018-12-22 DIAGNOSIS — Z01419 Encounter for gynecological examination (general) (routine) without abnormal findings: Secondary | ICD-10-CM | POA: Diagnosis present

## 2018-12-22 HISTORY — DX: Malignant neoplasm of unspecified site of unspecified female breast: C50.919

## 2018-12-31 ENCOUNTER — Encounter: Payer: Self-pay | Admitting: Family Medicine

## 2018-12-31 ENCOUNTER — Ambulatory Visit (INDEPENDENT_AMBULATORY_CARE_PROVIDER_SITE_OTHER): Payer: BLUE CROSS/BLUE SHIELD | Admitting: Family Medicine

## 2018-12-31 VITALS — BP 116/68 | HR 71 | Temp 98.0°F | Resp 16 | Ht 60.0 in | Wt 183.9 lb

## 2018-12-31 DIAGNOSIS — G9332 Myalgic encephalomyelitis/chronic fatigue syndrome: Secondary | ICD-10-CM

## 2018-12-31 DIAGNOSIS — G4733 Obstructive sleep apnea (adult) (pediatric): Secondary | ICD-10-CM | POA: Diagnosis not present

## 2018-12-31 DIAGNOSIS — K219 Gastro-esophageal reflux disease without esophagitis: Secondary | ICD-10-CM

## 2018-12-31 DIAGNOSIS — E538 Deficiency of other specified B group vitamins: Secondary | ICD-10-CM | POA: Diagnosis not present

## 2018-12-31 DIAGNOSIS — F339 Major depressive disorder, recurrent, unspecified: Secondary | ICD-10-CM | POA: Diagnosis not present

## 2018-12-31 DIAGNOSIS — R5382 Chronic fatigue, unspecified: Secondary | ICD-10-CM

## 2018-12-31 DIAGNOSIS — I10 Essential (primary) hypertension: Secondary | ICD-10-CM

## 2018-12-31 DIAGNOSIS — G43009 Migraine without aura, not intractable, without status migrainosus: Secondary | ICD-10-CM

## 2018-12-31 DIAGNOSIS — M797 Fibromyalgia: Secondary | ICD-10-CM

## 2018-12-31 DIAGNOSIS — D8989 Other specified disorders involving the immune mechanism, not elsewhere classified: Secondary | ICD-10-CM

## 2018-12-31 DIAGNOSIS — E785 Hyperlipidemia, unspecified: Secondary | ICD-10-CM

## 2018-12-31 MED ORDER — AMLODIPINE BESYLATE 10 MG PO TABS: 10.0000 mg | ORAL_TABLET | Freq: Every day | ORAL | 0 refills | Status: DC

## 2018-12-31 MED ORDER — MODAFINIL 100 MG PO TABS: 100.0000 mg | ORAL_TABLET | Freq: Every day | ORAL | 0 refills | Status: DC

## 2018-12-31 NOTE — Progress Notes (Signed)
Name: GRACEANNE GUIN   MRN: [id number]    DOB: 08-May-1961   Date:12/31/2018       Progress Note  Subjective  Chief Complaint  Chief Complaint  Patient presents with  . Medication Refill  . Hypertension    States it has been up and down  . Fibromyalgia  . Depression  . Dyslipidemia  . Gastroesophageal Reflux  . Sleep Apnea    Will take Gapabentin as needed for FMS pain and will help her sleep a little bit longer    HPI  HTN: seen by Benjamine Mola a couple of weeks ago, bp was up, she was having chest congestion and cough, Benicar HCTZ was stopped and she is now on Norvasc 10 mg, bp still a little elevated, she thought only needed to take chlorthalidone if she developed swelling, explained that it is a diuretic that works for bp. She is monitoring bp at home, and usually well controlled 120's-130's, occasionally spikes and she takes chlorthalidone prn only.   FMS: she continues to have daily pain, she stopped all her medications a 6 months ago, no longer taking hydrocodone, but is back taking gabapentin prn during flares, it helps her sleep and feels better when she wakes up. Pain is now 3-4/10   CFIDS: she states she has been feeling more tired than usual lately. She still has post-activity fatigue and needs to nap at times, she states she tends to overdue when feeling well and she pays for it afterwards.   Reflux esophagitis: Seen by Dr. Durwin Reges she had an EGD and esophageal dilation, and was diagnosed with reflux esophagitis. She is only taking Dexilant prn now, symptoms are stable, still has heartburn intermittently   Major Depression: she wastaking Paxil and Wellbutrin, but stopped this Summer and states feeling better, fatigue is stable, no crying spells, suicidal thoughts or ideation . She states mood is good.   OSA: she is wearing CPAP every night, all night. She gets her tubes replaced when needed. She states she still feels tired during the day, we will try provigil    Dyslipidemia: she refuses statin therapy, last lipid panel reviewed with patient and it did not look good, discussed life style modification, including eating more fish and tree nuts and walking her dog  Morbid obesity: lost 9 lbs over the Summer and 5 lbs since Fall, she states it must have been from not taking gabapentin daily like she used to. Also eating lean meat, fruit and vegetables.   History of breast cancer: seen by Dr. Mike Gip in the fall, mammogram is up to date   Flush sensation: she states yesterday her head felt full, followed by fatigue, bp was high, but no chest pain or associated SOB, discussed angina symptoms and call 911 if keeps happening.    Patient Active Problem List   Diagnosis Date Noted  . Anemia 04/04/2018  . B12 deficiency 03/29/2018  . Cognitive decline 01/02/2017  . Chronic kidney disease, stage 3 ([place]) 09/23/2016  . Sleep apnea 07/11/2016  . ASCUS favor benign 07/10/2016  . Memory loss 05/12/2016  . Smell or taste sensation disturbance 05/12/2016  . Personal history of colonic polyps   . Benign neoplasm of transverse colon   . Rectal polyp   . Tenosynovitis of thumb 03/12/2016  . Bilateral knee pain 03/12/2016  . Reflux esophagitis   . Stricture and stenosis of esophagus   . Abnormal findings-gastrointestinal tract   . Thickening of esophagus 08/03/2015  . Choking 07/04/2015  .  Benign essential HTN 05/01/2015  . CFIDS (chronic fatigue and immune dysfunction syndrome) ([place]) 05/01/2015  . Major depression, recurrent, chronic ([place]) 05/01/2015  . Dyslipidemia 05/01/2015  . Fibromyalgia syndrome 05/01/2015  . Blood glucose elevated 05/01/2015  . Eczema intertrigo 05/01/2015  . Iron deficiency anemia due to chronic blood loss 05/01/2015  . Migraine without aura and without status migrainosus, not intractable 05/01/2015  . Excessive urination at night 05/01/2015  . Dysmetabolic syndrome 09/17/8526  . History of colonoscopy with polypectomy  05/01/2015  . Central sleep apnea 05/01/2015  . History of back surgery 05/01/2015  . Thickened nails 05/01/2015  . Malignant neoplasm of upper-outer quadrant of female breast, triple negative 06/13/2013    Past Surgical History:  Procedure Laterality Date  . BACK SURGERY    . BREAST BIOPSY Left 10/2012   +  . 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 BREAST LUMPECTOMY Left 12/06/12    lumpectomy with SN biopsy and power port placement...  . COLONOSCOPY  2013   Dr. Suzette Battiest   . COLONOSCOPY WITH PROPOFOL N/A 04/28/2016   Procedure: COLONOSCOPY WITH PROPOFOL;  Surgeon: Lucilla Lame, MD;  Location: [facility];  Service: Endoscopy;  Laterality: N/A;  CPAP  . ESOPHAGOGASTRODUODENOSCOPY (EGD) WITH PROPOFOL N/A 10/23/2015   Procedure: ESOPHAGOGASTRODUODENOSCOPY (EGD) WITH PROPOFOL;  Surgeon: Lucilla Lame, MD;  Location: [facility];  Service: Endoscopy;  Laterality: N/A;  . POLYPECTOMY  04/28/2016   Procedure: POLYPECTOMY;  Surgeon: Lucilla Lame, MD;  Location: [facility];  Service: Endoscopy;;  . SHOULDER SURGERY Left 2009  . TONSILLECTOMY  1965  . TUBAL LIGATION  1982  . UPPER GI ENDOSCOPY     Dr. Suzette Battiest    Family History  Problem Relation Age of Onset  . Breast cancer Mother 80  . Diabetes Mother   . Gout Mother   . Kidney failure Mother   . Hypertension Mother   . Congestive Heart Failure Mother   . Stomach cancer Maternal Aunt   . Brain cancer Paternal Uncle        x 2 mat uncles  . Heart attack Maternal Grandmother   . Hypertension Maternal Grandmother   . Stroke Maternal Grandmother   . Epilepsy Maternal Aunt     Social History   Socioeconomic History  . Marital status: Married    Spouse name: Christia Reading  . Number of children: 1  . Years of education: Not on file  . Highest education level: GED or equivalent  Occupational History  . Occupation: Unemployed  Social Needs  . Financial resource strain: Not hard at all  .  Food insecurity:    Worry: Never true    Inability: Never true  . Transportation needs:    Medical: No    Non-medical: No  Tobacco Use  . Smoking status: Former Smoker    Packs/day: 0.50    Years: 4.00    Pack years: 2.00    Types: Cigarettes    Start date: 11/24/1976    Last attempt to quit: 11/24/1980    Years since quitting: 38.1  . Smokeless tobacco: Never Used  Substance and Sexual Activity  . Alcohol use: Yes    Alcohol/week: 0.0 standard drinks    Comment: rarely, 1-2x/mo glass of wine  . Drug use: No  . Sexual activity: Yes    Partners: Male  Lifestyle  . Physical activity:    Days per week: 5 days    Minutes per session: 40  min  . Stress: Only a little  Relationships  . Social connections:    Talks on phone: More than three times a week    Gets together: More than three times a week    Attends religious service: More than 4 times per year    Active member of club or organization: Yes    Attends meetings of clubs or organizations: More than 4 times per year    Relationship status: Married  . Intimate partner violence:    Fear of current or ex partner: No    Emotionally abused: No    Physically abused: No    Forced sexual activity: No  Other Topics Concern  . Not on file  Social History Narrative  . Not on file     Current Outpatient Medications:  .  ALPRAZolam (XANAX) 0.5 MG tablet, TAKE 1 TABLET BY MOUTH TWICE A DAY AS NEEDED FOR ANXIETY, Disp: 30 tablet, Rfl: 0 .  amLODipine (NORVASC) 10 MG tablet, Take 1 tablet (10 mg total) by mouth daily. At night, Disp: 90 tablet, Rfl: 0 .  chlorthalidone (HYGROTON) 25 MG tablet, Take 0.5 tablets (12.5 mg total) by mouth daily. In am (Patient taking differently: Take 12.5 mg by mouth as needed. In am), Disp: 45 tablet, Rfl: 0 .  DEXILANT 60 MG capsule, TAKE 1 CAPSULE BY MOUTH EVERY DAY (Patient taking differently: Take 60 mg by mouth as needed. ), Disp: 90 capsule, Rfl: 1 .  docusate sodium (COLACE) 100 MG capsule, Take  100 mg by mouth daily as needed. , Disp: , Rfl:  .  Nerve Stimulator (STANDARD TENS) DEVI, 1 Units by Does not apply route daily., Disp: 1 Device, Rfl: 0  Current Facility-Administered Medications:  .  cyanocobalamin ((VITAMIN B-12)) injection 1,000 mcg, 1,000 mcg, Intramuscular, Q30 days, Steele Sizer, MD, 1,000 mcg at 12/31/18 9242  Allergies  Allergen Reactions  . Ace Inhibitors Swelling  . Benicar [Olmesartan] Other (See Comments)    Chest Pressure and Allergic Reaction  . Pregabalin     weight gain and blurred vision  . Venlafaxine     foggy minded    I personally reviewed active problem list, medication list, allergies, family history, social history with the patient/caregiver today.   ROS  Constitutional: Negative for fever or significant weight change.  Respiratory: Negative for cough and shortness of breath.   Cardiovascular: Negative for chest pain or palpitations.  Gastrointestinal: Negative for abdominal pain, no bowel changes.  Musculoskeletal: Negative for gait problem or joint swelling.  Skin: Negative for rash.  Neurological: Negative for dizziness or headache.  No other specific complaints in a complete review of systems (except as listed in HPI above).   Objective  Vitals:   12/31/18 0930  BP: 116/68  Pulse: 71  Resp: 16  Temp: 98 F (36.7 C)  TempSrc: Oral  SpO2: 99%  Weight: 183 lb 14.4 oz (83.4 kg)  Height: 5' (1.524 m)    Body mass index is 35.92 kg/m.  Physical Exam  Constitutional: Patient appears well-developed and well-nourished. Obese No distress.  HEENT: head atraumatic, normocephalic, pupils equal and reactive to light, neck supple, throat within normal limits Cardiovascular: Normal rate, regular rhythm and normal heart sounds.  No murmur heard. No BLE edema. Pulmonary/Chest: Effort normal and breath sounds normal. No respiratory distress. Abdominal: Soft.  There is no tenderness. Muscular skeletal: trigger point positive   Psychiatric: Patient has a normal mood and affect. behavior is normal. Judgment and thought content normal.  PHQ2/9: Depression screen [facility] 2/9 12/31/2018 09/30/2018 09/08/2018 07/06/2018 04/05/2018  Decreased Interest 1 0 0 2 1  Down, Depressed, Hopeless 0 0 0 1 1  PHQ - 2 Score 1 0 0 3 2  Altered sleeping 0 0 - 1 0  Tired, decreased energy 1 1 - 2 3  Change in appetite 0 0 - 1 0  Feeling bad or failure about yourself  0 0 - 1 0  Trouble concentrating 0 0 - 1 1  Moving slowly or fidgety/restless 1 0 - 2 2  Suicidal thoughts 0 0 - 0 0  PHQ-9 Score 3 1 - 11 8  Difficult doing work/chores Not difficult at all Not difficult at all - Very difficult Very difficult  Some recent data might be hidden     Fall Risk: Fall Risk  12/31/2018 09/30/2018 09/15/2018 09/08/2018 01/01/2018  Falls in the past year? 0 1 No No No  Comment - - - - -  Number falls in past yr: - 1 - - -  Comment - - - - -  Injury with Fall? - 0 - - -  Risk for fall due to : - Other (Comment) - - -  Risk for fall due to: Comment - She trips easily - - -     Functional Status Survey: Is the patient deaf or have difficulty hearing?: No Does the patient have difficulty seeing, even when wearing glasses/contacts?: No Does the patient have difficulty concentrating, remembering, or making decisions?: No Does the patient have difficulty walking or climbing stairs?: Yes Does the patient have difficulty dressing or bathing?: No Does the patient have difficulty doing errands alone such as visiting a doctor's office or shopping?: No    Assessment & Plan  1. Benign essential HTN  - amLODipine (NORVASC) 10 MG tablet; Take 1 tablet (10 mg total) by mouth daily. At night  Dispense: 90 tablet; Refill: 0  2. CFIDS (chronic fatigue and immune dysfunction syndrome) ([facility])   3. Major depression, recurrent, chronic ([facility])  Mostly secondary to medical problems that causes severe fatigue, off medications and doing well   4. Obstructive  sleep apnea syndrome  She is compliant with CPAP , she feels tired all the time and would like to try modanafil.   - modafinil (PROVIGIL) 100 MG tablet; Take 1 tablet (100 mg total) by mouth daily.  Dispense: 30 tablet; Refill: 0  5. Migraine without aura and without status migrainosus, not intractable  No recent episodes   6. Gastro-esophageal reflux disease without esophagitis   7. B12 deficiency  B12 today  8. Fibromyalgia syndrome  Stable   9. Dyslipidemia  Refuses statin therapy

## 2019-01-04 ENCOUNTER — Other Ambulatory Visit: Payer: Self-pay

## 2019-01-04 MED ORDER — ARMODAFINIL 150 MG PO TABS: 150.0000 mg | ORAL_TABLET | Freq: Every day | ORAL | 0 refills | Status: DC

## 2019-01-04 NOTE — Telephone Encounter (Signed)
Insurance does not want to cover Modafinil until patient tries Armodafinil.

## 2019-01-31 ENCOUNTER — Ambulatory Visit (INDEPENDENT_AMBULATORY_CARE_PROVIDER_SITE_OTHER): Payer: BLUE CROSS/BLUE SHIELD

## 2019-01-31 DIAGNOSIS — E538 Deficiency of other specified B group vitamins: Secondary | ICD-10-CM

## 2019-01-31 NOTE — Progress Notes (Signed)
Patient came in for her B-12 injection. She tolerated it well. NKDA.   

## 2019-02-14 ENCOUNTER — Encounter: Payer: Self-pay | Admitting: Family Medicine

## 2019-02-28 ENCOUNTER — Other Ambulatory Visit: Payer: Self-pay

## 2019-02-28 ENCOUNTER — Ambulatory Visit (INDEPENDENT_AMBULATORY_CARE_PROVIDER_SITE_OTHER): Payer: BLUE CROSS/BLUE SHIELD

## 2019-02-28 DIAGNOSIS — E538 Deficiency of other specified B group vitamins: Secondary | ICD-10-CM | POA: Diagnosis not present

## 2019-02-28 NOTE — Progress Notes (Signed)
Patient came in for her B-12 injection. She tolerated it well. NKDA.   

## 2019-03-28 ENCOUNTER — Ambulatory Visit: Payer: BLUE CROSS/BLUE SHIELD

## 2019-03-31 ENCOUNTER — Encounter: Payer: Self-pay | Admitting: Family Medicine

## 2019-03-31 ENCOUNTER — Other Ambulatory Visit: Payer: Self-pay

## 2019-03-31 ENCOUNTER — Ambulatory Visit: Payer: BLUE CROSS/BLUE SHIELD | Admitting: Family Medicine

## 2019-03-31 VITALS — BP 136/82 | HR 69 | Temp 97.6°F | Resp 16 | Ht 60.0 in | Wt 181.1 lb

## 2019-03-31 DIAGNOSIS — R5382 Chronic fatigue, unspecified: Secondary | ICD-10-CM

## 2019-03-31 DIAGNOSIS — K219 Gastro-esophageal reflux disease without esophagitis: Secondary | ICD-10-CM

## 2019-03-31 DIAGNOSIS — G43009 Migraine without aura, not intractable, without status migrainosus: Secondary | ICD-10-CM

## 2019-03-31 DIAGNOSIS — F419 Anxiety disorder, unspecified: Secondary | ICD-10-CM

## 2019-03-31 DIAGNOSIS — F325 Major depressive disorder, single episode, in full remission: Secondary | ICD-10-CM

## 2019-03-31 DIAGNOSIS — D8989 Other specified disorders involving the immune mechanism, not elsewhere classified: Secondary | ICD-10-CM

## 2019-03-31 DIAGNOSIS — I10 Essential (primary) hypertension: Secondary | ICD-10-CM | POA: Diagnosis not present

## 2019-03-31 DIAGNOSIS — M797 Fibromyalgia: Secondary | ICD-10-CM

## 2019-03-31 DIAGNOSIS — E538 Deficiency of other specified B group vitamins: Secondary | ICD-10-CM

## 2019-03-31 DIAGNOSIS — E785 Hyperlipidemia, unspecified: Secondary | ICD-10-CM

## 2019-03-31 DIAGNOSIS — Z9889 Other specified postprocedural states: Secondary | ICD-10-CM

## 2019-03-31 DIAGNOSIS — G4733 Obstructive sleep apnea (adult) (pediatric): Secondary | ICD-10-CM

## 2019-03-31 MED ORDER — ALPRAZOLAM 0.5 MG PO TABS: ORAL_TABLET | ORAL | 0 refills | Status: DC

## 2019-03-31 MED ORDER — AMLODIPINE BESYLATE 10 MG PO TABS: 10.0000 mg | ORAL_TABLET | Freq: Every day | ORAL | 0 refills | Status: DC

## 2019-03-31 MED ORDER — GABAPENTIN 400 MG PO CAPS: 400.0000 mg | ORAL_CAPSULE | Freq: Every day | ORAL | 0 refills | Status: DC

## 2019-03-31 NOTE — Progress Notes (Signed)
Name: Deanna Baker   MRN: [id number]    DOB: 08/07/1961   Date:03/31/2019       Progress Note  Subjective  Chief Complaint  Chief Complaint  Patient presents with  . Medication Refill  . Hypertension  . Fibromyalgia  . CFIDS  . Depression  . Dyslipidemia  . History of breast cancer    HPI  HTN: she is now on Norvasc 10 mg and occasional chlorthalidone for swelling, however doing well at thsi time, bp has been at goal and no side effects . No chest pain or palpitation.   FMS: she continues to have daily pain,she stopped all her medications a 6 months ago, no longer taking hydrocodone, but is back taking gabapentin prn during flares, it helps her sleep and feels better when she wakes up. Pain is now 4/10 , it can go up to 8/10.   CFIDS:she states she has been feeling more tired than usual lately. She still has post-activity fatigue and needs to nap at times, she states she tends to overdue when feeling well and she pays for it afterwards. Unchanged   Reflux esophagitis: Seen by Dr. Durwin Reges she had an EGD and esophageal dilation, and was diagnosed with reflux esophagitis. She is only taking Dexilant prn now because she is afraid of getting her B12 too low, symptoms are stable, still has heartburn intermittently , worse at night   Major Depression: she wastaking Paxil and Wellbutrin, but stopped this Summer 2019 and states feeling better, fatigue is stable, no crying spells, suicidal thoughts or ideation. She states mood is good, currently in remission . Takes alprazolam very seldom for anxiety   OSA: she is wearing CPAP every night, all night. She gets her tubes replaced when needed. She states she still feels tired during the day, we sent rx of Provigil but unable to afford, not covered by insurance   Dyslipidemia: she refuses statin therapy, last lipid panel reviewed with patient and it did not look good, discussed life style modification, including eating more fish and tree  nuts and walking her dog  Morbid obesity: lost 15 lbs since last year, she states seems that it started after she stopped taking gabapentin daily and also walking more with her dog and eating lean meat, fruit and vegetables.   History of breast cancer: seen by Dr. Mike Gip in the fall, mammogram is up to date    Patient Active Problem List   Diagnosis Date Noted  . Anemia 04/04/2018  . B12 deficiency 03/29/2018  . Cognitive decline 01/02/2017  . Chronic kidney disease, stage 3 ([place]) 09/23/2016  . Sleep apnea 07/11/2016  . ASCUS favor benign 07/10/2016  . Memory loss 05/12/2016  . Smell or taste sensation disturbance 05/12/2016  . Personal history of colonic polyps   . Benign neoplasm of transverse colon   . Rectal polyp   . Tenosynovitis of thumb 03/12/2016  . Bilateral knee pain 03/12/2016  . Reflux esophagitis   . Stricture and stenosis of esophagus   . Abnormal findings-gastrointestinal tract   . Thickening of esophagus 08/03/2015  . Choking 07/04/2015  . Benign essential HTN 05/01/2015  . CFIDS (chronic fatigue and immune dysfunction syndrome) ([place]) 05/01/2015  . Major depression, recurrent, chronic ([place]) 05/01/2015  . Dyslipidemia 05/01/2015  . Fibromyalgia syndrome 05/01/2015  . Blood glucose elevated 05/01/2015  . Eczema intertrigo 05/01/2015  . Iron deficiency anemia due to chronic blood loss 05/01/2015  . Migraine without aura and without status migrainosus, not intractable 05/01/2015  .  Excessive urination at night 05/01/2015  . Dysmetabolic syndrome 75/17/0017  . History of colonoscopy with polypectomy 05/01/2015  . Central sleep apnea 05/01/2015  . History of back surgery 05/01/2015  . Thickened nails 05/01/2015    Past Surgical History:  Procedure Laterality Date  . BACK SURGERY    . BREAST BIOPSY Left 10/2012   +  . 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 BREAST LUMPECTOMY Left 12/06/12    lumpectomy with SN biopsy and power  port placement...  . COLONOSCOPY  2013   Dr. Suzette Battiest   . COLONOSCOPY WITH PROPOFOL N/A 04/28/2016   Procedure: COLONOSCOPY WITH PROPOFOL;  Surgeon: Lucilla Lame, MD;  Location: [facility];  Service: Endoscopy;  Laterality: N/A;  CPAP  . ESOPHAGOGASTRODUODENOSCOPY (EGD) WITH PROPOFOL N/A 10/23/2015   Procedure: ESOPHAGOGASTRODUODENOSCOPY (EGD) WITH PROPOFOL;  Surgeon: Lucilla Lame, MD;  Location: [facility];  Service: Endoscopy;  Laterality: N/A;  . POLYPECTOMY  04/28/2016   Procedure: POLYPECTOMY;  Surgeon: Lucilla Lame, MD;  Location: [place];  Service: Endoscopy;;  . SHOULDER SURGERY Left 2009  . TONSILLECTOMY  1965  . TUBAL LIGATION  1982  . UPPER GI ENDOSCOPY     Dr. Suzette Battiest    Family History  Problem Relation Age of Onset  . Breast cancer Mother 18  . Diabetes Mother   . Gout Mother   . Kidney failure Mother   . Hypertension Mother   . Congestive Heart Failure Mother   . Stomach cancer Maternal Aunt   . Brain cancer Paternal Uncle        x 2 mat uncles  . Heart attack Maternal Grandmother   . Hypertension Maternal Grandmother   . Stroke Maternal Grandmother   . Epilepsy Maternal Aunt     Social History   Socioeconomic History  . Marital status: Married    Spouse name: Christia Reading  . Number of children: 1  . Years of education: Not on file  . Highest education level: GED or equivalent  Occupational History  . Occupation: Unemployed  Social Needs  . Financial resource strain: Not hard at all  . Food insecurity:    Worry: Never true    Inability: Never true  . Transportation needs:    Medical: No    Non-medical: No  Tobacco Use  . Smoking status: Former Smoker    Packs/day: 0.50    Years: 4.00    Pack years: 2.00    Types: Cigarettes    Start date: 11/24/1976    Last attempt to quit: 11/24/1980    Years since quitting: 38.3  . Smokeless tobacco: Never Used  Substance and Sexual Activity  . Alcohol use: Yes    Alcohol/week: 0.0 standard  drinks    Comment: rarely, 1-2x/mo glass of wine  . Drug use: No  . Sexual activity: Yes    Partners: Male  Lifestyle  . Physical activity:    Days per week: 5 days    Minutes per session: 40 min  . Stress: Only a little  Relationships  . Social connections:    Talks on phone: More than three times a week    Gets together: More than three times a week    Attends religious service: More than 4 times per year    Active member of club or organization: Yes    Attends meetings of clubs or organizations: More than 4 times per year    Relationship status: Married  .  Intimate partner violence:    Fear of current or ex partner: No    Emotionally abused: No    Physically abused: No    Forced sexual activity: No  Other Topics Concern  . Not on file  Social History Narrative  . Not on file     Current Outpatient Medications:  .  ALPRAZolam (XANAX) 0.5 MG tablet, TAKE 1 TABLET BY MOUTH TWICE A DAY AS NEEDED FOR ANXIETY, Disp: 30 tablet, Rfl: 0 .  amLODipine (NORVASC) 10 MG tablet, Take 1 tablet (10 mg total) by mouth daily. At night, Disp: 90 tablet, Rfl: 0 .  chlorthalidone (HYGROTON) 25 MG tablet, Take 0.5 tablets (12.5 mg total) by mouth daily. In am (Patient taking differently: Take 12.5 mg by mouth as needed. In am), Disp: 45 tablet, Rfl: 0 .  DEXILANT 60 MG capsule, TAKE 1 CAPSULE BY MOUTH EVERY DAY (Patient taking differently: Take 60 mg by mouth as needed. ), Disp: 90 capsule, Rfl: 1 .  gabapentin (NEURONTIN) 400 MG capsule, Take 1 capsule (400 mg total) by mouth at bedtime., Disp: 90 capsule, Rfl: 0 .  Nerve Stimulator (STANDARD TENS) DEVI, 1 Units by Does not apply route daily., Disp: 1 Device, Rfl: 0  Current Facility-Administered Medications:  .  cyanocobalamin ((VITAMIN B-12)) injection 1,000 mcg, 1,000 mcg, Intramuscular, Q30 days, Steele Sizer, MD, 1,000 mcg at 03/31/19 0931  Allergies  Allergen Reactions  . Ace Inhibitors Swelling  . Benicar [Olmesartan] Other (See  Comments)    Chest Pressure and Allergic Reaction  . Pregabalin     weight gain and blurred vision  . Venlafaxine     foggy minded    I personally reviewed active problem list, medication list, allergies, family history with the patient/caregiver today.   ROS  Constitutional: Negative for fever or weight change.  Respiratory: Negative for cough and shortness of breath.   Cardiovascular: Negative for chest pain or palpitations.  Gastrointestinal: Negative for abdominal pain, no bowel changes.  Musculoskeletal: Negative for gait problem or joint swelling.  Skin: Negative for rash.  Neurological: Negative for dizziness or headache.  No other specific complaints in a complete review of systems (except as listed in HPI above).  Objective  Vitals:   03/31/19 0932  BP: 136/82  Pulse: 69  Resp: 16  Temp: 97.6 F (36.4 C)  TempSrc: Oral  SpO2: 99%  Weight: 181 lb 1.6 oz (82.1 kg)  Height: 5' (1.524 m)    Body mass index is 35.37 kg/m.  Physical Exam  Constitutional: Patient appears well-developed and well-nourished. Obese  No distress.  HEENT: head atraumatic, normocephalic, pupils equal and reactive to light,  neck supple, throat within normal limits Cardiovascular: Normal rate, regular rhythm and normal heart sounds.  No murmur heard. No BLE edema. Pulmonary/Chest: Effort normal and breath sounds normal. No respiratory distress. Abdominal: Soft.  There is no tenderness. Muscular Skeletal: trigger points positive  Psychiatric: Patient has a normal mood and affect. behavior is normal. Judgment and thought content normal.  PHQ2/9: Depression screen [facility] 2/9 03/31/2019 12/31/2018 09/30/2018 09/08/2018 07/06/2018  Decreased Interest 0 1 0 0 2  Down, Depressed, Hopeless 0 0 0 0 1  PHQ - 2 Score 0 1 0 0 3  Altered sleeping 1 0 0 - 1  Tired, decreased energy 1 1 1  - 2  Change in appetite 0 0 0 - 1  Feeling bad or failure about yourself  0 0 0 - 1  Trouble concentrating 0 0 0 -  1   Moving slowly or fidgety/restless 1 1 0 - 2  Suicidal thoughts 0 0 0 - 0  PHQ-9 Score 3 3 1  - 11  Difficult doing work/chores Not difficult at all Not difficult at all Not difficult at all - Very difficult  Some recent data might be hidden    phq 9 is negative   Fall Risk: Fall Risk  03/31/2019 12/31/2018 09/30/2018 09/15/2018 09/08/2018  Falls in the past year? 0 0 1 No No  Comment - - - - -  Number falls in past yr: 0 - 1 - -  Comment - - - - -  Injury with Fall? 0 - 0 - -  Risk for fall due to : - - Other (Comment) - -  Risk for fall due to: Comment - - She trips easily - -     Functional Status Survey: Is the patient deaf or have difficulty hearing?: No Does the patient have difficulty seeing, even when wearing glasses/contacts?: No Does the patient have difficulty concentrating, remembering, or making decisions?: No Does the patient have difficulty walking or climbing stairs?: No Does the patient have difficulty dressing or bathing?: No Does the patient have difficulty doing errands alone such as visiting a doctor's office or shopping?: No    Assessment & Plan  1. Major depression in remission [facility])  Doing well off SSRI, only taking alprazolam prn, doing well   2. Benign essential HTN  - amLODipine (NORVASC) 10 MG tablet; Take 1 tablet (10 mg total) by mouth daily. At night  Dispense: 90 tablet; Refill: 0  3. B12 deficiency  B12 today   4. CFIDS (chronic fatigue and immune dysfunction syndrome) ([facility])   5. Obstructive sleep apnea syndrome   6. Fibromyalgia syndrome  - gabapentin (NEURONTIN) 400 MG capsule; Take 1 capsule (400 mg total) by mouth at bedtime.  Dispense: 90 capsule; Refill: 0  7. Dyslipidemia  On life style modification  8. Status post left breast lumpectomy  For breast cancer   9. Gastro-esophageal reflux disease without esophagitis  Only taking medication prn now   10. Migraine without aura and without status migrainosus, not  intractable   11. Anxiety  - ALPRAZolam (XANAX) 0.5 MG tablet; TAKE 1 TABLET BY MOUTH TWICE A DAY AS NEEDED FOR ANXIETY  Dispense: 30 tablet; Refill: 0

## 2019-04-28 ENCOUNTER — Other Ambulatory Visit: Payer: Self-pay

## 2019-04-28 ENCOUNTER — Ambulatory Visit (INDEPENDENT_AMBULATORY_CARE_PROVIDER_SITE_OTHER): Payer: BLUE CROSS/BLUE SHIELD

## 2019-04-28 DIAGNOSIS — E538 Deficiency of other specified B group vitamins: Secondary | ICD-10-CM

## 2019-04-28 NOTE — Progress Notes (Signed)
Patient came in for her B-12 injection. She tolerated it well. NKDA.   

## 2019-05-30 ENCOUNTER — Ambulatory Visit (INDEPENDENT_AMBULATORY_CARE_PROVIDER_SITE_OTHER): Payer: BLUE CROSS/BLUE SHIELD

## 2019-05-30 ENCOUNTER — Other Ambulatory Visit: Payer: Self-pay

## 2019-05-30 DIAGNOSIS — E538 Deficiency of other specified B group vitamins: Secondary | ICD-10-CM

## 2019-05-30 NOTE — Progress Notes (Signed)
Patient came in for her B-12 injection. She tolerated it well. NKDA.   

## 2019-06-23 ENCOUNTER — Other Ambulatory Visit: Payer: Self-pay | Admitting: Family Medicine

## 2019-06-23 DIAGNOSIS — M797 Fibromyalgia: Secondary | ICD-10-CM

## 2019-07-01 MED ORDER — CYANOCOBALAMIN 1000 MCG/ML IJ SOLN
1000.0000 ug | Freq: Once | INTRAMUSCULAR | Status: DC
  Administered 2019-05-30: 1000 ug via INTRAMUSCULAR

## 2019-07-01 NOTE — Addendum Note (Signed)
Addended by: Inda Coke on: 07/01/2019 02:45 PM   Modules accepted: Orders

## 2019-07-06 ENCOUNTER — Encounter: Payer: Self-pay | Admitting: Family Medicine

## 2019-07-06 ENCOUNTER — Other Ambulatory Visit: Payer: Self-pay

## 2019-07-06 ENCOUNTER — Telehealth: Payer: Self-pay

## 2019-07-06 ENCOUNTER — Ambulatory Visit (INDEPENDENT_AMBULATORY_CARE_PROVIDER_SITE_OTHER): Payer: BLUE CROSS/BLUE SHIELD | Admitting: Family Medicine

## 2019-07-06 VITALS — BP 132/70 | HR 79 | Temp 97.8°F | Resp 16 | Ht 60.0 in | Wt 180.1 lb

## 2019-07-06 DIAGNOSIS — I10 Essential (primary) hypertension: Secondary | ICD-10-CM | POA: Diagnosis not present

## 2019-07-06 DIAGNOSIS — G4733 Obstructive sleep apnea (adult) (pediatric): Secondary | ICD-10-CM

## 2019-07-06 DIAGNOSIS — F325 Major depressive disorder, single episode, in full remission: Secondary | ICD-10-CM | POA: Diagnosis not present

## 2019-07-06 DIAGNOSIS — J300 Vasomotor rhinitis: Secondary | ICD-10-CM | POA: Diagnosis not present

## 2019-07-06 DIAGNOSIS — R5382 Chronic fatigue, unspecified: Secondary | ICD-10-CM

## 2019-07-06 DIAGNOSIS — E538 Deficiency of other specified B group vitamins: Secondary | ICD-10-CM | POA: Diagnosis not present

## 2019-07-06 DIAGNOSIS — E785 Hyperlipidemia, unspecified: Secondary | ICD-10-CM

## 2019-07-06 DIAGNOSIS — D8989 Other specified disorders involving the immune mechanism, not elsewhere classified: Secondary | ICD-10-CM

## 2019-07-06 DIAGNOSIS — Z1211 Encounter for screening for malignant neoplasm of colon: Secondary | ICD-10-CM

## 2019-07-06 DIAGNOSIS — M797 Fibromyalgia: Secondary | ICD-10-CM

## 2019-07-06 MED ORDER — CELECOXIB 200 MG PO CAPS: 200.0000 mg | ORAL_CAPSULE | Freq: Every day | ORAL | 0 refills | Status: DC

## 2019-07-06 MED ORDER — IPRATROPIUM BROMIDE 0.06 % NA SOLN: 2.0000 | Freq: Four times a day (QID) | NASAL | 2 refills | Status: DC

## 2019-07-06 MED ORDER — TRAMADOL HCL 50 MG PO TABS: 50.0000 mg | ORAL_TABLET | Freq: Three times a day (TID) | ORAL | 0 refills | Status: AC | PRN

## 2019-07-06 MED ORDER — AMLODIPINE BESYLATE 10 MG PO TABS: 10.0000 mg | ORAL_TABLET | Freq: Every day | ORAL | 0 refills | Status: DC

## 2019-07-06 NOTE — Progress Notes (Signed)
Name: Deanna Baker   MRN: [id number]    DOB: 1960-12-28   Date:07/06/2019       Progress Note  Subjective  Chief Complaint  Chief Complaint  Patient presents with  . Follow-up    3 month recheck  . B12 Injection    HPI  HTN: she is now on Norvasc 10 mg and occasional chlorthalidone for swelling, no chest pain, palpitation or SOB  FMS: she continues to have daily pain,she stopped all her medications a13months ago, no longer takinghydrocodone, but is back taking gabapentin prn during flares, it helps her sleep and feels better when she wakes up.Pain is now 5/10 , it can go up to 10/10. She has episodes that the entire right side from her head to her leg, feels like burning and tingling sensation intermittent, explained it may be secondary to FMS, episodes can take a few days to resolve, tens units seems to help  CFIDS:shestates she has been feeling more tired than usual lately. Shestill has post-activity fatigue and needs to nap at times, she states she tends to overdue when feeling well and she pays for it afterwards. Unchanged   Reflux esophagitis: Seen by Dr. Durwin Reges she had an EGD and esophageal dilation, and was diagnosed with reflux esophagitis. She is only taking Dexilant prn now because she is afraid of getting he r B12 too low, symptoms seems to be a little worse since the severe rhinorrhea and post-nasal drainage   Major Depression: she wastaking Paxil and Wellbutrin, but stopped this Summer 2019 and states feeling better, fatigue is stable, no crying spells, suicidal thoughts or ideation. She states mood is good, currently in remission Takes alprazolam very seldom for anxiety, she states only feels bad when pain is intense, it causes irritability   OSA: she is wearing CPAP every night, all night.She gets her tubes replaced when needed. She states she still feels tired during the day, we sent rx of Provigil but unable to afford - not covered by insurance    Dyslipidemia: she refuses statin therapy, last lipid panel reviewed with patient and it did not look good, discussed life style modification, she wants to hold off to check during her wellness this fall  Postnasal drainage: she states she used to have a lot of drainage years ago however getting worse again, she has to clear her throat, blows her nose and spits up mucus when she is showering. No SOB, no wheezing The drainage is always clear, she also has to rub her nose when eating. Discussed vaso-motor rhinitis Seems to be getting worse, happens when brushing her teeth also   Morbid obesity: lost8 lbs since last year, she states seems that it started after she stopped taking gabapentin daily and also walking more with her dog and eating lean meat, fruit and vegetables. She is walking her dog at least 5 days a week for about one mile  History of breast cancer:  mammogram is up to date , done 11/2018   Patient Active Problem List   Diagnosis Date Noted  . Anemia 04/04/2018  . B12 deficiency 03/29/2018  . Cognitive decline 01/02/2017  . Chronic kidney disease, stage 3 ([place]) 09/23/2016  . Sleep apnea 07/11/2016  . ASCUS favor benign 07/10/2016  . Memory loss 05/12/2016  . Smell or taste sensation disturbance 05/12/2016  . Personal history of colonic polyps   . Benign neoplasm of transverse colon   . Rectal polyp   . Tenosynovitis of thumb 03/12/2016  . Bilateral  knee pain 03/12/2016  . Reflux esophagitis   . Stricture and stenosis of esophagus   . Abnormal findings-gastrointestinal tract   . Thickening of esophagus 08/03/2015  . Choking 07/04/2015  . Benign essential HTN 05/01/2015  . CFIDS (chronic fatigue and immune dysfunction syndrome) ([place]) 05/01/2015  . Major depression, recurrent, chronic ([place]) 05/01/2015  . Dyslipidemia 05/01/2015  . Fibromyalgia syndrome 05/01/2015  . Blood glucose elevated 05/01/2015  . Eczema intertrigo 05/01/2015  . Iron deficiency anemia due to  chronic blood loss 05/01/2015  . Migraine without aura and without status migrainosus, not intractable 05/01/2015  . Excessive urination at night 05/01/2015  . Dysmetabolic syndrome 62/26/3335  . History of colonoscopy with polypectomy 05/01/2015  . Central sleep apnea 05/01/2015  . History of back surgery 05/01/2015  . Thickened nails 05/01/2015    Past Surgical History:  Procedure Laterality Date  . BACK SURGERY    . BREAST BIOPSY Left 10/2012   +  . 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 BREAST LUMPECTOMY Left 12/06/12    lumpectomy with SN biopsy and power port placement...  . COLONOSCOPY  2013   Dr. Suzette Battiest   . COLONOSCOPY WITH PROPOFOL N/A 04/28/2016   Procedure: COLONOSCOPY WITH PROPOFOL;  Surgeon: Lucilla Lame, MD;  Location: [facility];  Service: Endoscopy;  Laterality: N/A;  CPAP  . ESOPHAGOGASTRODUODENOSCOPY (EGD) WITH PROPOFOL N/A 10/23/2015   Procedure: ESOPHAGOGASTRODUODENOSCOPY (EGD) WITH PROPOFOL;  Surgeon: Lucilla Lame, MD;  Location: [facility];  Service: Endoscopy;  Laterality: N/A;  . POLYPECTOMY  04/28/2016   Procedure: POLYPECTOMY;  Surgeon: Lucilla Lame, MD;  Location: [facility];  Service: Endoscopy;;  . SHOULDER SURGERY Left 2009  . TONSILLECTOMY  1965  . TUBAL LIGATION  1982  . UPPER GI ENDOSCOPY     Dr. Suzette Battiest    Family History  Problem Relation Age of Onset  . Breast cancer Mother 36  . Diabetes Mother   . Gout Mother   . Kidney failure Mother   . Hypertension Mother   . Congestive Heart Failure Mother   . Stomach cancer Maternal Aunt   . Brain cancer Paternal Uncle        x 2 mat uncles  . Heart attack Maternal Grandmother   . Hypertension Maternal Grandmother   . Stroke Maternal Grandmother   . Epilepsy Maternal Aunt     Social History   Socioeconomic History  . Marital status: Married    Spouse name: Christia Reading  . Number of children: 1  . Years of education: Not on file  . Highest  education level: GED or equivalent  Occupational History  . Occupation: Unemployed  Social Needs  . Financial resource strain: Not hard at all  . Food insecurity    Worry: Never true    Inability: Never true  . Transportation needs    Medical: No    Non-medical: No  Tobacco Use  . Smoking status: Former Smoker    Packs/day: 0.50    Years: 4.00    Pack years: 2.00    Types: Cigarettes    Start date: 11/24/1976    Quit date: 11/24/1980    Years since quitting: 38.6  . Smokeless tobacco: Never Used  Substance and Sexual Activity  . Alcohol use: Yes    Alcohol/week: 0.0 standard drinks    Comment: rarely, 1-2x/mo glass of wine  . Drug use: No  . Sexual activity: Yes    Partners: Male  Lifestyle  .  Physical activity    Days per week: 5 days    Minutes per session: 40 min  . Stress: Only a little  Relationships  . Social connections    Talks on phone: More than three times a week    Gets together: More than three times a week    Attends religious service: More than 4 times per year    Active member of club or organization: Yes    Attends meetings of clubs or organizations: More than 4 times per year    Relationship status: Married  . Intimate partner violence    Fear of current or ex partner: No    Emotionally abused: No    Physically abused: No    Forced sexual activity: No  Other Topics Concern  . Not on file  Social History Narrative  . Not on file     Current Outpatient Medications:  .  ALPRAZolam (XANAX) 0.5 MG tablet, TAKE 1 TABLET BY MOUTH TWICE A DAY AS NEEDED FOR ANXIETY, Disp: 30 tablet, Rfl: 0 .  amLODipine (NORVASC) 10 MG tablet, Take 1 tablet (10 mg total) by mouth daily. At night, Disp: 90 tablet, Rfl: 0 .  chlorthalidone (HYGROTON) 25 MG tablet, Take 0.5 tablets (12.5 mg total) by mouth daily. In am (Patient taking differently: Take 12.5 mg by mouth as needed. In am), Disp: 45 tablet, Rfl: 0 .  DEXILANT 60 MG capsule, TAKE 1 CAPSULE BY MOUTH EVERY DAY  (Patient taking differently: Take 60 mg by mouth as needed. ), Disp: 90 capsule, Rfl: 1 .  gabapentin (NEURONTIN) 400 MG capsule, TAKE 1 CAPSULE (400 MG TOTAL) BY MOUTH AT BEDTIME., Disp: 90 capsule, Rfl: 1 .  Nerve Stimulator (STANDARD TENS) DEVI, 1 Units by Does not apply route daily., Disp: 1 Device, Rfl: 0  Current Facility-Administered Medications:  .  cyanocobalamin ((VITAMIN B-12)) injection 1,000 mcg, 1,000 mcg, Intramuscular, Q30 days, Steele Sizer, MD, 1,000 mcg at 05/30/19 1416  Allergies  Allergen Reactions  . Ace Inhibitors Swelling  . Benicar [Olmesartan] Other (See Comments)    Chest Pressure and Allergic Reaction  . Pregabalin     weight gain and blurred vision  . Venlafaxine     foggy minded    I personally reviewed active problem list, medication list, allergies, family history, social history with the patient/caregiver today.   ROS  Constitutional: Negative for fever or weight change.  Respiratory: positive for intermittent cough but no  shortness of breath.   Cardiovascular: Negative for chest pain or palpitations.  Gastrointestinal: Negative for abdominal pain, no bowel changes.  Musculoskeletal: Negative for gait problem or joint swelling.  Skin: Negative for rash.  Neurological: Negative for dizziness or headache.  No other specific complaints in a complete review of systems (except as listed in HPI above).  Objective  Vitals:   07/06/19 1014  BP: 132/70  Pulse: 79  Resp: 16  Temp: 97.8 F (36.6 C)  TempSrc: Oral  SpO2: 99%  Weight: 180 lb 1.6 oz (81.7 kg)  Height: 5' (1.524 m)    Body mass index is 35.17 kg/m.  Physical Exam  Constitutional: Patient appears well-developed and well-nourished. Obese  No distress.  HEENT: head atraumatic, normocephalic, pupils equal and reactive to light,  neck supple, Cardiovascular: Normal rate, regular rhythm and normal heart sounds.  No murmur heard. No BLE edema. Pulmonary/Chest: Effort normal and  breath sounds normal. No respiratory distress. Abdominal: Soft.  There is no tenderness. Muscular skeletal: trigger point positive  Psychiatric: Patient has a normal mood and affect. behavior is normal. Judgment and thought content normal.  PHQ2/9: Depression screen [facility] 2/9 07/06/2019 03/31/2019 12/31/2018 09/30/2018 09/08/2018  Decreased Interest 0 0 1 0 0  Down, Depressed, Hopeless 0 0 0 0 0  PHQ - 2 Score 0 0 1 0 0  Altered sleeping 0 1 0 0 -  Tired, decreased energy 0 1 1 1  -  Change in appetite 0 0 0 0 -  Feeling bad or failure about yourself  0 0 0 0 -  Trouble concentrating 0 0 0 0 -  Moving slowly or fidgety/restless 0 1 1 0 -  Suicidal thoughts 0 0 0 0 -  PHQ-9 Score 0 3 3 1  -  Difficult doing work/chores Not difficult at all Not difficult at all Not difficult at all Not difficult at all -  Some recent data might be hidden    phq 9 is negative   Fall Risk: Fall Risk  07/06/2019 03/31/2019 12/31/2018 09/30/2018 09/15/2018  Falls in the past year? 0 0 0 1 No  Comment - - - - -  Number falls in past yr: 0 0 - 1 -  Comment - - - - -  Injury with Fall? 0 0 - 0 -  Risk for fall due to : - - - Other (Comment) -  Risk for fall due to: Comment - - - She trips easily -  Follow up Falls evaluation completed - - - -     Assessment & Plan  1. Vasomotor rhinitis  - ipratropium (ATROVENT) 0.06 % nasal spray; Place 2 sprays into both nostrils 4 (four) times daily.  Dispense: 15 mL; Refill: 2  2. Benign essential HTN  - amLODipine (NORVASC) 10 MG tablet; Take 1 tablet (10 mg total) by mouth daily. At night  Dispense: 90 tablet; Refill: 0  3. Major depression in remission [facility])  Doing well   4. B12 deficiency  B12 today   5. Fibromyalgia syndrome  - celecoxib (CELEBREX) 200 MG capsule; Take 1 capsule (200 mg total) by mouth daily.  Dispense: 30 capsule; Refill: 0 - traMADol (ULTRAM) 50 MG tablet; Take 1 tablet (50 mg total) by mouth every 8 (eight) hours as needed for up to 5  days.  Dispense: 15 tablet; Refill: 0  6. Obstructive sleep apnea syndrome   7. CFIDS (chronic fatigue and immune dysfunction syndrome) ([facility])  stable  8. Dyslipidemia  Discussed diet

## 2019-07-06 NOTE — Telephone Encounter (Signed)
During patients triage she indicated that she would like to discuss GERD symptoms with Dr. Allen Norris prior to colonoscopy in case he would like to do EGD and Colon at the same time.  Appt has been scheduled with Dr. Allen Norris on October 6th.  Thanks [employer]

## 2019-07-06 NOTE — Patient Instructions (Signed)
Nonallergic Rhinitis Nonallergic rhinitis is a condition that causes symptoms that affect the nose, such as a runny nose and a stuffed-up nose (nasal congestion) that can make it hard to breathe through the nose. This condition is different from having an allergy (allergic rhinitis). Allergic rhinitis occurs when the body's defense system (immune system) reacts to a substance that you are allergic to (allergen), such as pollen, pet dander, mold, or dust. Nonallergic rhinitis has many similar symptoms, but it is not caused by allergens. Nonallergic rhinitis can be a short-term or long-term problem. What are the causes? This condition can be caused by many different things. Some common types of nonallergic rhinitis include: Infectious rhinitis  This is usually due to an infection in the upper respiratory tract. Vasomotor rhinitis  This is the most common type of long-term nonallergic rhinitis.  It is caused by too much blood flow through the nose, which makes the tissue inside of the nose swell.  Symptoms are often triggered by strong odors, cold air, stress, drinking alcohol, cigarette smoke, or changes in the weather. Occupational rhinitis  This type is caused by triggers in the workplace, such as chemicals, dusts, animal dander, or air pollution. Hormonal rhinitis  This type occurs in women as a result of an increase in the female hormone estrogen.  It may occur during pregnancy, puberty, and menstrual cycles.  Symptoms improve when estrogen levels drop. Drug-induced rhinitis Several drugs can cause nonallergic rhinitis, including:  Medicines that are used to treat high blood pressure, heart disease, and Parkinson disease.  Aspirin and NSAIDs.  Over-the-counter nasal decongestant sprays. These can cause a type of nonallergic rhinitis (rhinitis medicamentosa) when they are used for more than a few days. Nonallergic rhinitis with eosinophilia syndrome (NARES)  This type is caused by  having too much of a certain type of white blood cell (eosinophil). Nonallergic rhinitis can also be caused by a reaction to eating hot or spicy foods. This does not usually cause long-term symptoms. In some cases, the cause of nonallergic rhinitis is not known. What increases the risk? You are more likely to develop this condition if:  You are 73-37 years of age.  You are a woman. Women are twice as likely to have this condition. What are the signs or symptoms? Common symptoms of this condition include:  Nasal congestion.  Runny nose.  The feeling of mucus going down the back of the throat (postnasal drip).  Trouble sleeping at night and daytime sleepiness. Less common symptoms include:  Sneezing.  Coughing.  Itchy nose.  Bloodshot eyes. How is this diagnosed? This condition may be diagnosed based on:  Your symptoms and medical history.  A physical exam.  Allergy testing to rule out allergic rhinitis. You may have skin tests or blood tests. In some cases, the health care provider may take a swab of nasal secretions to look for an increased number of eosinophils. This would be done to confirm a diagnosis of NARES. How is this treated? Treatment for this condition depends on the cause. No single treatment works for everyone. Work with your health care provider to find the best treatment for you. Treatment may include:  Avoiding the things that trigger your symptoms.  Using medicines to relieve congestion, such as: ? Steroid nasal spray. There are many types. You may need to try a few to find out which one works best. ? Decongestant medicine. This may be an oral medicine or a nasal spray. These medicines are only used for  a short time.  Using medicines to relieve a runny nose. These may include antihistamine medicines or anticholinergic nasal sprays.  Surgery to remove tissue from inside the nose may be needed in severe cases if the condition has not improved after 6-12  months of medical treatment. Follow these instructions at home:  Take or use over-the-counter and prescription medicines only as told by your health care provider. Do not stop using your medicine even if you start to feel better.  Use salt-water (saline) rinses or other solutions (nasal washes or irrigations) to wash or rinse out the inside of your nose as told by your health care provider.  Do not take NSAIDs or medicines that contain aspirin if they make your symptoms worse.  Do not drink alcohol if it makes your symptoms worse.  Do not use any tobacco products, such as cigarettes, chewing tobacco, and e-cigarettes. If you need help quitting, ask your health care provider.  Avoid secondhand smoke.  Get some exercise every day. Exercise may help reduce symptoms of nonallergic rhinitis for some people. Ask your health care provider how much exercise and what types of exercise are safe for you.  Sleep with the head of your bed raised (elevated). This may reduce nighttime nasal congestion.  Keep all follow-up visits as told by your health care provider. This is important. Contact a health care provider if:  You have a fever.  Your symptoms are getting worse at home.  Your symptoms are not responding to medicine.  You develop new symptoms, especially a headache or nosebleed. This information is not intended to replace advice given to you by your health care provider. Make sure you discuss any questions you have with your health care provider. Document Released: 03/03/2016 Document Revised: 10/23/2017 Document Reviewed: 01/31/2016 Elsevier Patient Education  2020 [employer].

## 2019-07-31 ENCOUNTER — Other Ambulatory Visit: Payer: Self-pay | Admitting: Family Medicine

## 2019-07-31 DIAGNOSIS — M797 Fibromyalgia: Secondary | ICD-10-CM

## 2019-08-08 ENCOUNTER — Other Ambulatory Visit: Payer: Self-pay

## 2019-08-08 ENCOUNTER — Ambulatory Visit (INDEPENDENT_AMBULATORY_CARE_PROVIDER_SITE_OTHER): Payer: BLUE CROSS/BLUE SHIELD

## 2019-08-08 DIAGNOSIS — Z23 Encounter for immunization: Secondary | ICD-10-CM

## 2019-08-08 DIAGNOSIS — E538 Deficiency of other specified B group vitamins: Secondary | ICD-10-CM | POA: Diagnosis not present

## 2019-08-08 NOTE — Progress Notes (Signed)
Patient came in for her B-12 injection. She tolerated it well. NKDA.   

## 2019-08-28 ENCOUNTER — Other Ambulatory Visit: Payer: Self-pay | Admitting: Family Medicine

## 2019-08-28 DIAGNOSIS — M797 Fibromyalgia: Secondary | ICD-10-CM

## 2019-08-30 ENCOUNTER — Other Ambulatory Visit: Payer: Self-pay

## 2019-08-30 ENCOUNTER — Ambulatory Visit: Payer: BLUE CROSS/BLUE SHIELD | Admitting: Gastroenterology

## 2019-09-05 ENCOUNTER — Other Ambulatory Visit: Payer: Self-pay

## 2019-09-05 ENCOUNTER — Ambulatory Visit (INDEPENDENT_AMBULATORY_CARE_PROVIDER_SITE_OTHER): Payer: BLUE CROSS/BLUE SHIELD

## 2019-09-05 DIAGNOSIS — E538 Deficiency of other specified B group vitamins: Secondary | ICD-10-CM | POA: Diagnosis not present

## 2019-09-05 NOTE — Progress Notes (Signed)
Patient came in for her B-12 injection. She tolerated it well. NKDA.   

## 2019-09-13 ENCOUNTER — Encounter: Payer: Self-pay | Admitting: Hematology and Oncology

## 2019-09-23 ENCOUNTER — Telehealth: Payer: Self-pay

## 2019-09-23 DIAGNOSIS — C50412 Malignant neoplasm of upper-outer quadrant of left female breast: Secondary | ICD-10-CM

## 2019-09-23 DIAGNOSIS — R739 Hyperglycemia, unspecified: Secondary | ICD-10-CM

## 2019-09-23 DIAGNOSIS — E785 Hyperlipidemia, unspecified: Secondary | ICD-10-CM

## 2019-09-23 DIAGNOSIS — E538 Deficiency of other specified B group vitamins: Secondary | ICD-10-CM

## 2019-09-23 DIAGNOSIS — D509 Iron deficiency anemia, unspecified: Secondary | ICD-10-CM

## 2019-09-23 DIAGNOSIS — I10 Essential (primary) hypertension: Secondary | ICD-10-CM

## 2019-09-23 NOTE — Addendum Note (Signed)
Addended by: Steele Sizer F on: 09/23/2019 08:03 AM   Modules accepted: Orders

## 2019-09-23 NOTE — Telephone Encounter (Signed)
Patient called.  Patient aware.  

## 2019-09-23 NOTE — Telephone Encounter (Signed)
Copied from [place] [id number]. Topic: Appointment Scheduling - Scheduling Inquiry for Clinic >> Sep 22, 2019  2:13 PM Scherrie Gerlach wrote: Reason for CRM: pt would like to confirm you received the order for labs from Dr Mike Gip office, and she wants the labs Dr Ancil Boozer wants her to have for her cpe on 11/18 so she can do all labs at once. Also requesting these labs to be done PRIOR to 11/09 so Dr Mike Gip will have them in advance. Pt wants to know when to come? Please call.  Either number.

## 2019-09-30 LAB — COMPLETE METABOLIC PANEL WITH GFR
AG Ratio: 1.7 (calc) (ref 1.0–2.5)
ALT: 11 U/L (ref 6–29)
AST: 15 U/L (ref 10–35)
Albumin: 4.3 g/dL (ref 3.6–5.1)
Alkaline phosphatase (APISO): 128 U/L (ref 37–153)
BUN: 11 mg/dL (ref 7–25)
CO2: 27 mmol/L (ref 20–32)
Calcium: 10.1 mg/dL (ref 8.6–10.4)
Chloride: 106 mmol/L (ref 98–110)
Creat: 1.03 mg/dL (ref 0.50–1.05)
GFR, Est African American: 69 mL/min/{1.73_m2} (ref >=60)
GFR, Est Non African American: 60 mL/min/{1.73_m2} (ref >=60)
Globulin: 2.6 g/dL (calc) (ref 1.9–3.7)
Glucose, Bld: 113 mg/dL — ABNORMAL HIGH (ref 65–99)
Potassium: 4.3 mmol/L (ref 3.5–5.3)
Sodium: 144 mmol/L (ref 135–146)
Total Bilirubin: 0.7 mg/dL (ref 0.2–1.2)
Total Protein: 6.9 g/dL (ref 6.1–8.1)

## 2019-09-30 LAB — CBC WITH DIFFERENTIAL/PLATELET
Absolute Monocytes: 336 cells/uL (ref 200–950)
Basophils Absolute: 29 cells/uL (ref 0–200)
Basophils Relative: 0.5 %
Eosinophils Absolute: 108 cells/uL (ref 15–500)
Eosinophils Relative: 1.9 %
HCT: 40.4 % (ref 35.0–45.0)
Hemoglobin: 13.6 g/dL (ref 11.7–15.5)
Lymphs Abs: 1362 cells/uL (ref 850–3900)
MCH: 29.6 pg (ref 27.0–33.0)
MCHC: 33.7 g/dL (ref 32.0–36.0)
MCV: 88 fL (ref 80.0–100.0)
MPV: 11 fL (ref 7.5–12.5)
Monocytes Relative: 5.9 %
Neutro Abs: 3865 cells/uL (ref 1500–7800)
Neutrophils Relative %: 67.8 %
Platelets: 240 10*3/uL (ref 140–400)
RBC: 4.59 10*6/uL (ref 3.80–5.10)
RDW: 13.4 % (ref 11.0–15.0)
Total Lymphocyte: 23.9 %
WBC: 5.7 10*3/uL (ref 3.8–10.8)

## 2019-09-30 LAB — HEMOGLOBIN A1C
Hgb A1c MFr Bld: 5.4 % of total Hgb (ref <5.7)
Mean Plasma Glucose: 108 (calc)
eAG (mmol/L): 6 (calc)

## 2019-09-30 LAB — LIPID PANEL
Cholesterol: 252 mg/dL — ABNORMAL HIGH (ref <200)
HDL: 56 mg/dL (ref >=50)
LDL Cholesterol (Calc): 164 mg/dL (calc) — ABNORMAL HIGH
Non-HDL Cholesterol (Calc): 196 mg/dL (calc) — ABNORMAL HIGH (ref <130)
Total CHOL/HDL Ratio: 4.5 (calc) (ref <5.0)
Triglycerides: 167 mg/dL — ABNORMAL HIGH (ref <150)

## 2019-09-30 LAB — INTRINSIC FACTOR ANTIBODIES: Intrinsic Factor: NEGATIVE

## 2019-09-30 LAB — ANTI-PARIETAL ANTIBODY: PARIETAL CELL AB SCREEN: NEGATIVE

## 2019-09-30 LAB — CANCER ANTIGEN 27.29: CA 27.29: 29 U/mL (ref <38)

## 2019-10-02 NOTE — Progress Notes (Signed)
[facility]  [address], [address] [place], [place] [address] Phone: [phone]  Fax: [phone]   Clinic Day:  10/03/2019  Referring physician: Steele Sizer, MD  Chief Complaint: Deanna Baker is a 58 y.o. female with stage IA triple negative left breast cancer who is seen for 1 year assessment.  HPI: The patient was last seen in the medical oncology clinic on 09/30/2018. At that time, she was doing well overall.  Her memory improved with the discontinuation of gabapentin. She denied any B symptoms. She complained of intermittent tenderness in her left breast associated with weather changes. Patient had chronic fibromyalgia. She was no longer on any interventions. Exam was grossly unremarkable. Ferritin was 68 with an iron saturation 28% with a TIBC of 318. She continued monthly B-12 injections.   Bilateral mammogram on 12/22/2018 revealed no evidence of malignancy.   Patient was seen by Dr Ancil Boozer on 12/31/2018. She felt tired all the time. Patient was agreed to try modafinil. Patient was started on modafinil (Provigil) 100 mg tablet daily.   Patient was seen by Dr Ancil Boozer on 07/06/2019 for a follow up and B12 injection. Patient was doing well.  Last B12 was on 09/05/2019.  During the interim, she has felt good. She has had sinus drainage, reflux, and vomiting with brushing teeth. Her fatigue is related to her fibromyalgia. She notes problems with GERD. She notes a daily cough, and her throat feels "raspy". She note her chest feels heavy when she is experiencing all these symptoms. Her weight is down 12 pounds and she notes that she is not losing weight intentionally.   She is performing monthly self breast exams. She is getting her monthly B-12 injections. She reports being active.   She was unable to have an EGD and colonoscopy due to her insurance.  She is followed by Dr Allen Norris.   Past Medical History:  Diagnosis Date  . Anemia   . Arthritis 1990  .  Breast cancer ([place]) 2014  . Bronchitis 5/17  . Chronic fatigue syndrome   . Colon polyp 2013  . Fibromyalgia 1993  . GERD (gastroesophageal reflux disease)   . Heart murmur   . Hypertension 2012  . Lump or mass in breast   . Malignant neoplasm of upper-outer quadrant of female breast ([place])    Invasive mammary carcinoma, triple negative  . Measles   . Mumps 1973  . MVP (mitral valve prolapse)   . Obstructive sleep apnea on CPAP 11/27/15  . Personal history of radiation therapy    left  . Shortness of breath dyspnea    chronic fatigue/fibromyalgia    Past Surgical History:  Procedure Laterality Date  . BACK SURGERY    . BREAST BIOPSY Left 10/2012   +  . 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 BREAST LUMPECTOMY Left 12/06/12    lumpectomy with SN biopsy and power port placement...  . COLONOSCOPY  2013   Dr. Suzette Battiest   . COLONOSCOPY WITH PROPOFOL N/A 04/28/2016   Procedure: COLONOSCOPY WITH PROPOFOL;  Surgeon: Lucilla Lame, MD;  Location: [place];  Service: Endoscopy;  Laterality: N/A;  CPAP  . ESOPHAGOGASTRODUODENOSCOPY (EGD) WITH PROPOFOL N/A 10/23/2015   Procedure: ESOPHAGOGASTRODUODENOSCOPY (EGD) WITH PROPOFOL;  Surgeon: Lucilla Lame, MD;  Location: [facility];  Service: Endoscopy;  Laterality: N/A;  . POLYPECTOMY  04/28/2016   Procedure: POLYPECTOMY;  Surgeon: Lucilla Lame, MD;  Location: [place];  Service: Endoscopy;;  .  SHOULDER SURGERY Left 2009  . TONSILLECTOMY  1965  . TUBAL LIGATION  1982  . UPPER GI ENDOSCOPY     Dr. Suzette Battiest    Family History  Problem Relation Age of Onset  . Breast cancer Mother 66  . Diabetes Mother   . Gout Mother   . Kidney failure Mother   . Hypertension Mother   . Congestive Heart Failure Mother   . Stomach cancer Maternal Aunt   . Brain cancer Paternal Uncle        x 2 mat uncles  . Heart attack Maternal Grandmother   . Hypertension Maternal Grandmother   . Stroke Maternal  Grandmother   . Epilepsy Maternal Aunt     Social History:  reports that she quit smoking about 38 years ago. Her smoking use included cigarettes. She started smoking about 42 years ago. She has a 2.00 pack-year smoking history. She has never used smokeless tobacco. She reports current alcohol use. She reports that she does not use drugs. She has a 59 year old healthy daughter.  She lives in [place]. The patient is alone today.  Allergies:  Allergies  Allergen Reactions  . Ace Inhibitors Swelling  . Benicar [Olmesartan] Other (See Comments)    Chest Pressure and Allergic Reaction  . Pregabalin     weight gain and blurred vision  . Venlafaxine     foggy minded    Current Medications: Current Outpatient Medications  Medication Sig Dispense Refill  . ALPRAZolam (XANAX) 0.5 MG tablet TAKE 1 TABLET BY MOUTH TWICE A DAY AS NEEDED FOR ANXIETY 30 tablet 0  . amLODipine (NORVASC) 10 MG tablet Take 1 tablet (10 mg total) by mouth daily. At night 90 tablet 0  . celecoxib (CELEBREX) 200 MG capsule TAKE 1 CAPSULE BY MOUTH EVERY DAY 30 capsule 0  . chlorthalidone (HYGROTON) 25 MG tablet Take 0.5 tablets (12.5 mg total) by mouth daily. In am (Patient taking differently: Take 12.5 mg by mouth as needed. In am) 45 tablet 0  . DEXILANT 60 MG capsule TAKE 1 CAPSULE BY MOUTH EVERY DAY (Patient taking differently: Take 60 mg by mouth as needed. ) 90 capsule 1  . gabapentin (NEURONTIN) 400 MG capsule TAKE 1 CAPSULE (400 MG TOTAL) BY MOUTH AT BEDTIME. 90 capsule 1  . ipratropium (ATROVENT) 0.06 % nasal spray Place 2 sprays into both nostrils 4 (four) times daily. 15 mL 2  . Nerve Stimulator (STANDARD TENS) DEVI 1 Units by Does not apply route daily. 1 Device 0   Current Facility-Administered Medications  Medication Dose Route Frequency Provider Last Rate Last Dose  . cyanocobalamin ((VITAMIN B-12)) injection 1,000 mcg  1,000 mcg Intramuscular Q30 days Steele Sizer, MD   1,000 mcg at 09/05/19 0932     Review of Systems  Constitutional: Positive for malaise/fatigue (chronic) and weight loss (12 pounds since 09/30/2018; unintentional). Negative for diaphoresis and fever.       Feels "good", but fatigued.  HENT: Negative.  Negative for congestion, ear pain, hearing loss, nosebleeds, sinus pain and sore throat.        Sinus drainage. Throat feeling "raspy".   Eyes: Negative.  Negative for blurred vision, double vision and photophobia.  Respiratory: Positive for cough (daily). Negative for hemoptysis, sputum production and shortness of breath.        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   Cardiovascular: Negative.  Negative for chest pain, palpitations, orthopnea, leg swelling and PND.  Gastrointestinal: Positive for heartburn (on Dexilant), nausea and  vomiting (when brushing teeth). Negative for abdominal pain, blood in stool, constipation, diarrhea and melena.  Genitourinary: Negative.  Negative for dysuria, frequency, hematuria and urgency.  Musculoskeletal: Negative for back pain, falls, joint pain and myalgias.       Fibromyalgia.  Skin: Negative.  Negative for itching and rash.  Neurological: Negative.  Negative for dizziness, tremors, sensory change, speech change, focal weakness, weakness and headaches.  Endo/Heme/Allergies: Negative.  Does not bruise/bleed easily.  Psychiatric/Behavioral: Negative.  Negative for depression and memory loss. The patient is not nervous/anxious and does not have insomnia.   All other systems reviewed and are negative.  Performance status (ECOG):  1  Vitals Blood pressure (!) 150/76, pulse 69, temperature 98.3 F (36.8 C), temperature source Tympanic, resp. rate 18, height 5' (1.524 m), weight 176 lb 5.9 oz (80 kg), SpO2 100 %.  Physical Exam  Constitutional: She is oriented to person, place, and time. She appears well-developed and well-nourished. No distress.  HENT:  Head: Normocephalic and atraumatic.  Mouth/Throat: Oropharynx is clear and  moist. No oropharyngeal exudate.  Short brown hair. Mask.  Eyes: Pupils are equal, round, and reactive to light. Conjunctivae and EOM are normal. No scleral icterus.  Neck: Normal range of motion. Neck supple. No JVD present.  Cardiovascular: Normal rate, regular rhythm and normal heart sounds. Exam reveals no gallop.  No murmur heard. Pulmonary/Chest: Effort normal and breath sounds normal. No respiratory distress. She has no wheezes. She has no rales. She exhibits no tenderness. Left breast exhibits tenderness. Skin change: fibrocystic changes.  Abdominal: Soft. Bowel sounds are normal. She exhibits no distension and no mass. There is no abdominal tenderness. There is no rebound and no guarding.  Musculoskeletal: Normal range of motion.        General: Tenderness (bilateral lower extremities) present. No edema.  Lymphadenopathy:       Head (right side): No preauricular, no posterior auricular and no occipital adenopathy present.       Head (left side): No preauricular, no posterior auricular and no occipital adenopathy present.    She has no cervical adenopathy.    She has no axillary adenopathy.       Right: No inguinal and no supraclavicular adenopathy present.       Left: No inguinal and no supraclavicular adenopathy present.  Neurological: She is alert and oriented to person, place, and time.  Skin: Skin is warm and dry. No rash noted. She is not diaphoretic. No erythema. No pallor.  Psychiatric: She has a normal mood and affect. Her behavior is normal. Judgment and thought content normal.  Nursing note and vitals reviewed.   No visits with results within 3 Day(s) from this visit.  Latest known visit with results is:  Telephone on 09/23/2019  Component Date Value Ref Range Status  . Glucose, Bld 09/26/2019 113* 65 - 99 mg/dL Final   Comment: .            Fasting reference interval . For someone without known diabetes, a glucose value between 100 and 125 mg/dL is consistent with  prediabetes and should be confirmed with a follow-up test. .   . BUN 09/26/2019 11  7 - 25 mg/dL Final  . Creat 09/26/2019 1.03  0.50 - 1.05 mg/dL Final   Comment: For patients >50 years of age, the reference limit for Creatinine is approximately 13% higher for people identified as African-American. .   . GFR, Est Non African American 09/26/2019 60  > OR = 60  mL/min/1.46m Final  . GFR, Est African American 09/26/2019 69  > OR = 60 mL/min/1.778mFinal  . BUN/Creatinine Ratio 1193/90/3009OT APPLICABLE  6 - 22 (calc) Final  . Sodium 09/26/2019 144  135 - 146 mmol/L Final  . Potassium 09/26/2019 4.3  3.5 - 5.3 mmol/L Final  . Chloride 09/26/2019 106  98 - 110 mmol/L Final  . CO2 09/26/2019 27  20 - 32 mmol/L Final  . Calcium 09/26/2019 10.1  8.6 - 10.4 mg/dL Final  . Total Protein 09/26/2019 6.9  6.1 - 8.1 g/dL Final  . Albumin 09/26/2019 4.3  3.6 - 5.1 g/dL Final  . Globulin 09/26/2019 2.6  1.9 - 3.7 g/dL (calc) Final  . AG Ratio 09/26/2019 1.7  1.0 - 2.5 (calc) Final  . Total Bilirubin 09/26/2019 0.7  0.2 - 1.2 mg/dL Final  . Alkaline phosphatase (APISO) 09/26/2019 128  37 - 153 U/L Final  . AST 09/26/2019 15  10 - 35 U/L Final  . ALT 09/26/2019 11  6 - 29 U/L Final  . WBC 09/26/2019 5.7  3.8 - 10.8 Thousand/uL Final  . RBC 09/26/2019 4.59  3.80 - 5.10 Million/uL Final  . Hemoglobin 09/26/2019 13.6  11.7 - 15.5 g/dL Final  . HCT 09/26/2019 40.4  35.0 - 45.0 % Final  . MCV 09/26/2019 88.0  80.0 - 100.0 fL Final  . MCH 09/26/2019 29.6  27.0 - 33.0 pg Final  . MCHC 09/26/2019 33.7  32.0 - 36.0 g/dL Final  . RDW 09/26/2019 13.4  11.0 - 15.0 % Final  . Platelets 09/26/2019 240  140 - 400 Thousand/uL Final  . MPV 09/26/2019 11.0  7.5 - 12.5 fL Final  . Neutro Abs 09/26/2019 3,865  1,500 - 7,800 cells/uL Final  . Lymphs Abs 09/26/2019 1,362  850 - 3,900 cells/uL Final  . Absolute Monocytes 09/26/2019 336  200 - 950 cells/uL Final  . Eosinophils Absolute 09/26/2019 108  15 - 500  cells/uL Final  . Basophils Absolute 09/26/2019 29  0 - 200 cells/uL Final  . Neutrophils Relative % 09/26/2019 67.8  % Final  . Total Lymphocyte 09/26/2019 23.9  % Final  . Monocytes Relative 09/26/2019 5.9  % Final  . Eosinophils Relative 09/26/2019 1.9  % Final  . Basophils Relative 09/26/2019 0.5  % Final  . Cholesterol 09/26/2019 252* <200 mg/dL Final  . HDL 09/26/2019 56  > OR = 50 mg/dL Final  . Triglycerides 09/26/2019 167* <150 mg/dL Final  . LDL Cholesterol (Calc) 09/26/2019 164* mg/dL (calc) Final   Comment: Reference range: <100 . Desirable range <100 mg/dL for primary prevention;   <70 mg/dL for patients with CHD or diabetic patients  with > or = 2 CHD risk factors. . Marland KitchenDL-C is now calculated using the Martin-Hopkins  calculation, which is a validated novel method providing  better accuracy than the Friedewald equation in the  estimation of LDL-C.  MaCresenciano Genret al. JAAnnamaria [id number];[id number]  (http://education.[facility].com/faq/FAQ164)   . Total CHOL/HDL Ratio 09/26/2019 4.5  <5.0 (calc) Final  . Non-HDL Cholesterol (Calc) 09/26/2019 196* <130 mg/dL (calc) Final   Comment: For patients with diabetes plus 1 major ASCVD risk  factor, treating to a non-HDL-C goal of <100 mg/dL  (LDL-C of <70 mg/dL) is considered a therapeutic  option.   . Hgb A1c MFr Bld 09/26/2019 5.4  <5.7 % of total Hgb Final   Comment: For the purpose of screening for the presence of diabetes: . <5.7%  Consistent with the absence of diabetes 5.7-6.4%    Consistent with increased risk for diabetes             (prediabetes) > or =6.5%  Consistent with diabetes . This assay result is consistent with a decreased risk of diabetes. . Currently, no consensus exists regarding use of hemoglobin A1c for diagnosis of diabetes in children. . According to American Diabetes Association (ADA) guidelines, hemoglobin A1c <7.0% represents optimal control in non-pregnant diabetic patients.  Different metrics may apply to specific patient populations.  Standards of Medical Care in Diabetes(ADA). .   . Mean Plasma Glucose 09/26/2019 108  (calc) Final  . eAG (mmol/L) 09/26/2019 6.0  (calc) Final  . Intrinsic Factor 09/26/2019 Negative  Negative Final   Comment: . For additional information, please refer to http://education.[facility].com/faq/IFAB (This link is being provided for informational/  educational purposes only.) .   Marland Kitchen PARIETAL CELL AB SCREEN 09/26/2019 NEGATIVE  NEGATIVE Final  . CA 27.29 09/26/2019 29  <38 U/mL Final   Comment: . This test was performed using the Siemens Chemiluminescent method. Values obtained from different assay methods cannot be used interchangeably. CA 27.29 levels, regardless of value, should not be interpreted as absolute evidence of the presence or absence of disease. .     Assessment:  PENNIE VANBLARCOM is a 58 y.o. female with stage IA triple negative left breast cancer s/p lumpectomy and sentinel lymph node biopsy on 12/06/2012.  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  Tumor was ER, PR and HER-2/neu negative. Sentinel left nodes were negative. Pathologic stage was T1cN0M0.    BRCA mutation was negative in 11/2012.  She has a family history of breast cancer (mother at age 49 and maternal aunt).  She received Adriamycin and Cytoxan (AC) 4 from 12/17/2011 - 02/17/2013. She received weekly Taxol 12 from 03/10/2013 - 06/02/2013.  She received radiation.   CA27.29 has been monitored:  33.9 on 06/02/2013, 26.1 on 02/14/2014, 23.8 on 08/22/2014, 25.1 on 09/18/2016, 23.4 on 03/19/2017, 21.1 on 09/21/2017, 19.9 on 03/29/2018, 21 on 09/27/2018, and 29 on 09/26/2019.  Bilateral diagnostic mammogram on 12/14/2015 revealed expected density and architectural distortion with dystrophic calcifications in the upper outer quadrant of the left breast stable compared to prior studies. There was  left breast skin thickening, stable with post radiation change. There are no suspicious masses, malignant calcifications, sites of nonsurgical architectural distortions or assymmetries in either breast.  Bilateral diagnostic mammogram on 12/17/2016 revealed no evidence of malignancy. Bilateral diagnostic mammogram on 12/21/2017 revealing no mammographic evidence of malignancy in either breast.  Bilateral screening mammogram on 12/22/2018 revealed no evidence of malignancy.   She has a history of iron deficiency anemia.  She is on ferrous sulfate 325 mg a day.  Diet is good.  She denies any melena or hematochezia.  Hematocrit has improved from 30.4 on 03/18/2016 to 35.6 on 09/18/2016.     Ferritin has been followed: 39 on 09/18/2016, 36 on 03/19/2017, 34 on 09/21/2017, 64 on 03/10/2018, 51 on 03/29/2018, and 68 on 09/27/2018.  She was diagnosed with B12 deficiency on 03/29/2018.  B12 was 209.  She received B12 weekly x 6 (04/05/2018 - 05/17/2018) then monthly (last 09/03/2018).  B12 was 1142 on 09/27/2018.  Colonoscopy on 04/28/2016 revealed a 3 mm and 7 mm polyp in the transverse colon.  There was a 2 mm polyp in the rectum.  Repeat colonoscopy was recommended in 5 years for surveillance.  EGD  on 10/23/2015 revealed benign-appearing esophageal stenosis which was dilated.  She had LA Grade B reflux esophagitis.  Stomach and duodenum was normal.    She has fibromyalgia. Gabapentin caused memory issues and slurred speech, thus it was discontinued.   Symptomatically, she feels good, but is fatigued.  She has chronic sinus issues.  She has lost 12 pounds in the past year.  Exam is unremarkable.  Plan: 1.   Review labs from 09/26/2019. 2.   Stage IA triple negative left breast cancer Clinically she is doing well. Exam is unremarkable. Screening mammogram on 12/22/2018 revealed no evidence of malignancy. CA27.29 was normal on 09/26/2019. Continue yearly surveillance. 3.   B12 deficiency She  receives monthly B12 (last 09/05/2019). B12 was 1142 on 09/27/2018. Intrinsic factor antibody was negative on 09/26/2019. 4.   Iron deficiency Hematocrit 40.4.  Hemoglobin 13.6.  MCV 88.  Ferritin was 68 with an iron saturation of 28% on 09/28/2019. Continue to monitor. 5.   Sinus issues  Patient notes significant QOL issues secondary to ongoing sinus issues.  Referral to ENT. 6.   Encourage follow-up with GI. 7.   Schedule bilateral mammogram 12/23/2019. 8.   RTC in 1 year for MD assessment, labs (CBC with diff, CMP, CA27.29) and review of mammogram.  I discussed the assessment and treatment plan with the patient.  The patient was provided an opportunity to ask questions and all were answered.  The patient agreed with the plan and demonstrated an understanding of the instructions.  The patient was advised to call back if the symptoms worsen or if the condition fails to improve as anticipated.  I provided 16 minutes of face-to-face time during this this encounter and > 50% was spent counseling as documented under my assessment and plan.    Lequita Asal, MD, PhD    10/03/2019, 11:33 AM  I, Selena Batten, am acting as scribe for [employer]. Mike Gip, MD, PhD.  I, Melissa C. Mike Gip, MD, have reviewed the above documentation for accuracy and completeness, and I agree with the above.

## 2019-10-03 ENCOUNTER — Other Ambulatory Visit: Payer: BLUE CROSS/BLUE SHIELD

## 2019-10-03 ENCOUNTER — Other Ambulatory Visit: Payer: Self-pay

## 2019-10-03 ENCOUNTER — Inpatient Hospital Stay: Payer: BLUE CROSS/BLUE SHIELD | Attending: Hematology and Oncology | Admitting: Hematology and Oncology

## 2019-10-03 ENCOUNTER — Inpatient Hospital Stay: Payer: BLUE CROSS/BLUE SHIELD

## 2019-10-03 ENCOUNTER — Ambulatory Visit: Payer: BLUE CROSS/BLUE SHIELD | Admitting: Hematology and Oncology

## 2019-10-03 ENCOUNTER — Encounter: Payer: Self-pay | Admitting: Hematology and Oncology

## 2019-10-03 VITALS — BP 150/76 | HR 69 | Temp 98.3°F | Resp 18 | Ht 60.0 in | Wt 176.4 lb

## 2019-10-03 DIAGNOSIS — E538 Deficiency of other specified B group vitamins: Secondary | ICD-10-CM | POA: Insufficient documentation

## 2019-10-03 DIAGNOSIS — J349 Unspecified disorder of nose and nasal sinuses: Secondary | ICD-10-CM

## 2019-10-03 DIAGNOSIS — Z79899 Other long term (current) drug therapy: Secondary | ICD-10-CM | POA: Insufficient documentation

## 2019-10-03 DIAGNOSIS — M797 Fibromyalgia: Secondary | ICD-10-CM | POA: Insufficient documentation

## 2019-10-03 DIAGNOSIS — C50912 Malignant neoplasm of unspecified site of left female breast: Secondary | ICD-10-CM | POA: Insufficient documentation

## 2019-10-03 DIAGNOSIS — C50412 Malignant neoplasm of upper-outer quadrant of left female breast: Secondary | ICD-10-CM

## 2019-10-03 DIAGNOSIS — Z171 Estrogen receptor negative status [ER-]: Secondary | ICD-10-CM | POA: Insufficient documentation

## 2019-10-03 DIAGNOSIS — D500 Iron deficiency anemia secondary to blood loss (chronic): Secondary | ICD-10-CM

## 2019-10-03 NOTE — Progress Notes (Signed)
No new changes noted today 

## 2019-10-12 ENCOUNTER — Encounter: Payer: Self-pay | Admitting: Family Medicine

## 2019-10-12 ENCOUNTER — Other Ambulatory Visit: Payer: Self-pay

## 2019-10-12 ENCOUNTER — Ambulatory Visit (INDEPENDENT_AMBULATORY_CARE_PROVIDER_SITE_OTHER): Payer: BLUE CROSS/BLUE SHIELD | Admitting: Family Medicine

## 2019-10-12 DIAGNOSIS — M797 Fibromyalgia: Secondary | ICD-10-CM | POA: Diagnosis not present

## 2019-10-12 DIAGNOSIS — I10 Essential (primary) hypertension: Secondary | ICD-10-CM

## 2019-10-12 DIAGNOSIS — E538 Deficiency of other specified B group vitamins: Secondary | ICD-10-CM | POA: Diagnosis not present

## 2019-10-12 DIAGNOSIS — R0981 Nasal congestion: Secondary | ICD-10-CM

## 2019-10-12 DIAGNOSIS — R0982 Postnasal drip: Secondary | ICD-10-CM

## 2019-10-12 DIAGNOSIS — F419 Anxiety disorder, unspecified: Secondary | ICD-10-CM | POA: Diagnosis not present

## 2019-10-12 MED ORDER — ALPRAZOLAM 0.5 MG PO TABS: ORAL_TABLET | ORAL | 0 refills | Status: DC

## 2019-10-12 MED ORDER — AMLODIPINE BESYLATE 10 MG PO TABS: 10.0000 mg | ORAL_TABLET | Freq: Every day | ORAL | 0 refills | Status: DC

## 2019-10-12 MED ORDER — GABAPENTIN 400 MG PO CAPS: 400.0000 mg | ORAL_CAPSULE | Freq: Every day | ORAL | 1 refills | Status: DC

## 2019-10-12 NOTE — Progress Notes (Signed)
Name: Deanna Baker   MRN: [id number]    DOB: 1961/04/10   Date:10/12/2019       Progress Note  Subjective  Chief Complaint  Chief Complaint  Patient presents with  . Well woman exam    was unable to get Colonoscopy due to them not taking her insurance anymore    HPI  Patient presents for annual CPE.  GERD: she went to see Dr. Durwin Reges but colonoscopy and EGD but insurance no longer covering her visit, she will try to get another plan  Post-nasal drainage: constant drainage, tried multiple nasal spray, it makes her choke and vomit at times. Worse when showering and right after she brushes her teeth, it causes cough, no wheezing or SOB. When she vomits there is a lot of clear mucus. Going on for over one year, she is ready to see ENT since symptoms are getting progressively worse. She has substernal burning constantly . She used to take Dexilant but stopped because it low B12 about one year ago. Discussed resuming PPI to control symptoms   HTN: bp has been high lately, she has been taking norvasc daily but Chlorthalidone only prn, advised to take diuretic half pill daily and monitor. She has intermittent headache, no chest pain or palpitation   Weight loss: she did not change her diet, but lost 13 lbs since last year, discussed monitoring her protein intake. She sees Dr. Mike Gip   FMS: she takes gabapentin, activity makes it flare, pain seems to improve with medication. She still has mental fogginess.   Diet: she tries to eat a balanced diet  Exercise: she walks 5 times a week for about 30 minutes with her dog  USPSTF grade A and B recommendations    Office Visit from 10/12/2019 in [facility]  AUDIT-C Score  1     Depression: Phq 9 is  negative Depression screen [facility] 2/9 10/12/2019 07/06/2019 03/31/2019 12/31/2018 09/30/2018  Decreased Interest 0 0 0 1 0  Down, Depressed, Hopeless 0 0 0 0 0  PHQ - 2 Score 0 0 0 1 0  Altered sleeping 0 0 1 0 0  Tired, decreased  energy 2 0 '1 1 1  '$ Change in appetite 0 0 0 0 0  Feeling bad or failure about yourself  0 0 0 0 0  Trouble concentrating 1 0 0 0 0  Moving slowly or fidgety/restless 0 0 1 1 0  Suicidal thoughts 0 0 0 0 0  PHQ-9 Score 3 0 '3 3 1  '$ Difficult doing work/chores - Not difficult at all Not difficult at all Not difficult at all Not difficult at all  Some recent data might be hidden   Hypertension: BP Readings from Last 3 Encounters:  10/12/19 (!) 144/86  10/03/19 (!) 150/76  07/06/19 132/70   Obesity: Wt Readings from Last 3 Encounters:  10/12/19 175 lb 4.8 oz (79.5 kg)  10/03/19 176 lb 5.9 oz (80 kg)  07/06/19 180 lb 1.6 oz (81.7 kg)   BMI Readings from Last 3 Encounters:  10/12/19 33.95 kg/m  10/03/19 34.44 kg/m  07/06/19 35.17 kg/m     Hep C Screening: 2015 STD testing and prevention (HIV/chl/gon/syphilis): N/A Intimate partner violence: negative screen  Sexual History/Pain during Intercourse:no pain or discomfort  Menstrual History/LMP/Abnormal Bleeding: discussed post-menopausal bleeding  Incontinence Symptoms: very seldom has stress incontinence when the bladder is full and she sneezes, discussed kegel  Breast cancer:  - Last Mammogram: already scheduled for Feb 2021 -  BRCA gene screening: personal history of breast cancer   Osteoporosis: discussed high calcium and vitamin D supplementation  Cervical cancer screening: repeat every 5 years   Skin cancer: discussed atypical lesions  Colorectal cancer: she went to Dr. Durwin Reges but insurance not covering and she is looking for another plan  Lung cancer:   Low Dose CT Chest recommended if Age 53-80 years, 30 pack-year currently smoking OR have quit w/in 15years. Patient does not qualify.   ECG:09/2018   Advanced Care Planning: A voluntary discussion about advance care planning including the explanation and discussion of advance directives.  Discussed health care proxy and Living will, and the patient was able to identify a  health care proxy as husband   Patient does have a living will at present time.   Lipids: Lab Results  Component Value Date   CHOL 252 (H) 09/26/2019   CHOL 247 (H) 09/27/2018   CHOL 237 (H) 07/03/2017   Lab Results  Component Value Date   HDL 56 09/26/2019   HDL 56 09/27/2018   HDL 46 (L) 07/03/2017   Lab Results  Component Value Date   LDLCALC 164 (H) 09/26/2019   LDLCALC 160 (H) 09/27/2018   LDLCALC 153 (H) 07/03/2017   Lab Results  Component Value Date   TRIG 167 (H) 09/26/2019   TRIG 163 (H) 09/27/2018   TRIG 190 (H) 07/03/2017   Lab Results  Component Value Date   CHOLHDL 4.5 09/26/2019   CHOLHDL 4.4 09/27/2018   CHOLHDL 5.2 (H) 07/03/2017   No results found for: LDLDIRECT  Glucose: Glucose  Date Value Ref Range Status  02/20/2015 113 (H) mg/dL Final    Comment:    65-99 NOTE: New Reference Range  01/30/15   08/22/2014 94 65 - 99 mg/dL Final  02/14/2014 117 (H) 65 - 99 mg/dL Final   Glucose, Bld  Date Value Ref Range Status  09/26/2019 113 (H) 65 - 99 mg/dL Final    Comment:    .            Fasting reference interval . For someone without known diabetes, a glucose value between 100 and 125 mg/dL is consistent with prediabetes and should be confirmed with a follow-up test. .   09/27/2018 116 (H) 65 - 99 mg/dL Final    Comment:    .            Fasting reference interval . For someone without known diabetes, a glucose value between 100 and 125 mg/dL is consistent with prediabetes and should be confirmed with a follow-up test. .   03/29/2018 133 (H) 65 - 99 mg/dL Final   Glucose-Capillary  Date Value Ref Range Status  08/16/2015 124 (H) 65 - 99 mg/dL Final    Patient Active Problem List   Diagnosis Date Noted  . Anemia 04/04/2018  . B12 deficiency 03/29/2018  . Cognitive decline 01/02/2017  . Chronic kidney disease, stage 3 09/23/2016  . Sleep apnea 07/11/2016  . ASCUS favor benign 07/10/2016  . Memory loss 05/12/2016  . Smell  or taste sensation disturbance 05/12/2016  . Personal history of colonic polyps   . Benign neoplasm of transverse colon   . Rectal polyp   . Tenosynovitis of thumb 03/12/2016  . Bilateral knee pain 03/12/2016  . Reflux esophagitis   . Stricture and stenosis of esophagus   . Abnormal findings-gastrointestinal tract   . Thickening of esophagus 08/03/2015  . Choking 07/04/2015  . Benign essential HTN 05/01/2015  .  CFIDS (chronic fatigue and immune dysfunction syndrome) ([place]) 05/01/2015  . Major depression, recurrent, chronic ([place]) 05/01/2015  . Dyslipidemia 05/01/2015  . Fibromyalgia syndrome 05/01/2015  . Blood glucose elevated 05/01/2015  . Eczema intertrigo 05/01/2015  . Iron deficiency anemia due to chronic blood loss 05/01/2015  . Migraine without aura and without status migrainosus, not intractable 05/01/2015  . Excessive urination at night 05/01/2015  . Dysmetabolic syndrome 37/34/2876  . History of colonoscopy with polypectomy 05/01/2015  . Central sleep apnea 05/01/2015  . History of back surgery 05/01/2015  . Thickened nails 05/01/2015    Past Surgical History:  Procedure Laterality Date  . BACK SURGERY    . BREAST BIOPSY Left 10/2012   +  . 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 BREAST LUMPECTOMY Left 12/06/12    lumpectomy with SN biopsy and power port placement...  . COLONOSCOPY  2013   Dr. Suzette Battiest   . COLONOSCOPY WITH PROPOFOL N/A 04/28/2016   Procedure: COLONOSCOPY WITH PROPOFOL;  Surgeon: Lucilla Lame, MD;  Location: [place];  Service: Endoscopy;  Laterality: N/A;  CPAP  . ESOPHAGOGASTRODUODENOSCOPY (EGD) WITH PROPOFOL N/A 10/23/2015   Procedure: ESOPHAGOGASTRODUODENOSCOPY (EGD) WITH PROPOFOL;  Surgeon: Lucilla Lame, MD;  Location: [facility];  Service: Endoscopy;  Laterality: N/A;  . POLYPECTOMY  04/28/2016   Procedure: POLYPECTOMY;  Surgeon: Lucilla Lame, MD;  Location: [place];  Service: Endoscopy;;  .  SHOULDER SURGERY Left 2009  . TONSILLECTOMY  1965  . TUBAL LIGATION  1982  . UPPER GI ENDOSCOPY     Dr. Suzette Battiest    Family History  Problem Relation Age of Onset  . Breast cancer Mother 30  . Diabetes Mother   . Gout Mother   . Kidney failure Mother   . Hypertension Mother   . Congestive Heart Failure Mother   . Stomach cancer Maternal Aunt   . Brain cancer Paternal Uncle        x 2 mat uncles  . Heart attack Maternal Grandmother   . Hypertension Maternal Grandmother   . Stroke Maternal Grandmother   . Epilepsy Maternal Aunt     Social History   Socioeconomic History  . Marital status: Married    Spouse name: Christia Reading  . Number of children: 1  . Years of education: Not on file  . Highest education level: GED or equivalent  Occupational History  . Occupation: Unemployed  Social Needs  . Financial resource strain: Not hard at all  . Food insecurity    Worry: Never true    Inability: Never true  . Transportation needs    Medical: No    Non-medical: No  Tobacco Use  . Smoking status: Former Smoker    Packs/day: 0.50    Years: 4.00    Pack years: 2.00    Types: Cigarettes    Start date: 11/24/1976    Quit date: 11/24/1980    Years since quitting: 38.9  . Smokeless tobacco: Never Used  Substance and Sexual Activity  . Alcohol use: Yes    Alcohol/week: 0.0 standard drinks    Comment: rarely, 1-2x/mo glass of wine  . Drug use: No  . Sexual activity: Yes    Partners: Male    Birth control/protection: Post-menopausal  Lifestyle  . Physical activity    Days per week: 4 days    Minutes per session: 30 min  . Stress: Only a little  Relationships  . Social connections  Talks on phone: More than three times a week    Gets together: More than three times a week    Attends religious service: More than 4 times per year    Active member of club or organization: Yes    Attends meetings of clubs or organizations: More than 4 times per year    Relationship status:  Married  . Intimate partner violence    Fear of current or ex partner: No    Emotionally abused: No    Physically abused: No    Forced sexual activity: No  Other Topics Concern  . Not on file  Social History Narrative  . Not on file     Current Outpatient Medications:  .  ALPRAZolam (XANAX) 0.5 MG tablet, TAKE 1 TABLET BY MOUTH TWICE A DAY AS NEEDED FOR ANXIETY, Disp: 30 tablet, Rfl: 0 .  amLODipine (NORVASC) 10 MG tablet, Take 1 tablet (10 mg total) by mouth daily. At night, Disp: 90 tablet, Rfl: 0 .  celecoxib (CELEBREX) 200 MG capsule, TAKE 1 CAPSULE BY MOUTH EVERY DAY, Disp: 30 capsule, Rfl: 0 .  chlorthalidone (HYGROTON) 25 MG tablet, Take 0.5 tablets (12.5 mg total) by mouth daily. In am (Patient taking differently: Take 12.5 mg by mouth as needed. In am), Disp: 45 tablet, Rfl: 0 .  gabapentin (NEURONTIN) 400 MG capsule, TAKE 1 CAPSULE (400 MG TOTAL) BY MOUTH AT BEDTIME., Disp: 90 capsule, Rfl: 1 .  Nerve Stimulator (STANDARD TENS) DEVI, 1 Units by Does not apply route daily., Disp: 1 Device, Rfl: 0 .  DEXILANT 60 MG capsule, TAKE 1 CAPSULE BY MOUTH EVERY DAY (Patient not taking: No sig reported), Disp: 90 capsule, Rfl: 1 .  ipratropium (ATROVENT) 0.06 % nasal spray, Place 2 sprays into both nostrils 4 (four) times daily. (Patient not taking: Reported on 10/12/2019), Disp: 15 mL, Rfl: 2  Current Facility-Administered Medications:  .  cyanocobalamin ((VITAMIN B-12)) injection 1,000 mcg, 1,000 mcg, Intramuscular, Q30 days, Steele Sizer, MD, 1,000 mcg at 09/05/19 0932  Allergies  Allergen Reactions  . Ace Inhibitors Swelling  . Benicar [Olmesartan] Other (See Comments)    Chest Pressure and Allergic Reaction  . Pregabalin     weight gain and blurred vision  . Venlafaxine     foggy minded     ROS  Constitutional: Negative for fever or weight change.  Respiratory: Positive for cough ( post-nasal drainage ) but no shortness of breath.   Cardiovascular: Negative for chest  pain or palpitations.  Gastrointestinal: Negative for abdominal pain, no bowel changes.  Musculoskeletal: Negative for gait problem or joint swelling.  Skin: Negative for rash.  Neurological: Negative for dizziness, positive for intermittent  headache.  No other specific complaints in a complete review of systems (except as listed in HPI above).  Objective  Vitals:   10/12/19 1110  BP: (!) 144/86  Pulse: 82  Resp: 16  Temp: (!) 97.5 F (36.4 C)  TempSrc: Temporal  SpO2: 97%  Weight: 175 lb 4.8 oz (79.5 kg)  Height: 5' 0.25" (1.53 m)    Body mass index is 33.95 kg/m.  Physical Exam  Constitutional: Patient appears well-developed and obese No distress.  HENT: Head: Normocephalic and atraumatic. Ears: B TMs ok, no erythema or effusion; Nose: Nose normal. Mouth/Throat: Oropharynx is clear and moist. No oropharyngeal exudate.  Eyes: Conjunctivae and EOM are normal. Pupils are equal, round, and reactive to light. No scleral icterus.  Neck: Normal range of motion. Neck supple. No JVD present.  No thyromegaly present.  Cardiovascular: Normal rate, regular rhythm and normal heart sounds.  No murmur heard. No BLE edema. Pulmonary/Chest: Effort normal and breath sounds normal. No respiratory distress. Abdominal: Soft. Bowel sounds are normal, no distension. There is no tenderness. no masses Breast: no lumps or masses, no nipple discharge or rashes FEMALE GENITALIA:  Not done RECTAL: not done Musculoskeletal: Normal range of motion, no joint effusions. No gross deformities Neurological: he is alert and oriented to person, place, and time. No cranial nerve deficit. Coordination, balance, strength, speech and gait are normal.  Skin: Skin is warm and dry. No rash noted. No erythema.  Psychiatric: Patient has a normal mood and affect. behavior is normal. Judgment and thought content normal.  Recent Results (from the past 2160 hour(s))  COMPLETE METABOLIC PANEL WITH GFR     Status: Abnormal    Collection Time: 09/26/19 12:00 AM  Result Value Ref Range   Glucose, Bld 113 (H) 65 - 99 mg/dL    Comment: .            Fasting reference interval . For someone without known diabetes, a glucose value between 100 and 125 mg/dL is consistent with prediabetes and should be confirmed with a follow-up test. .    BUN 11 7 - 25 mg/dL   Creat 1.03 0.50 - 1.05 mg/dL    Comment: For patients >87 years of age, the reference limit for Creatinine is approximately 13% higher for people identified as African-American. .    GFR, Est Non African American 60 > OR = 60 mL/min/1.48m   GFR, Est African American 69 > OR = 60 mL/min/1.759m  BUN/Creatinine Ratio NOT APPLICABLE 6 - 22 (calc)   Sodium 144 135 - 146 mmol/L   Potassium 4.3 3.5 - 5.3 mmol/L   Chloride 106 98 - 110 mmol/L   CO2 27 20 - 32 mmol/L   Calcium 10.1 8.6 - 10.4 mg/dL   Total Protein 6.9 6.1 - 8.1 g/dL   Albumin 4.3 3.6 - 5.1 g/dL   Globulin 2.6 1.9 - 3.7 g/dL (calc)   AG Ratio 1.7 1.0 - 2.5 (calc)   Total Bilirubin 0.7 0.2 - 1.2 mg/dL   Alkaline phosphatase (APISO) 128 37 - 153 U/L   AST 15 10 - 35 U/L   ALT 11 6 - 29 U/L  CBC with Differential/Platelet     Status: None   Collection Time: 09/26/19 12:00 AM  Result Value Ref Range   WBC 5.7 3.8 - 10.8 Thousand/uL   RBC 4.59 3.80 - 5.10 Million/uL   Hemoglobin 13.6 11.7 - 15.5 g/dL   HCT 40.4 35.0 - 45.0 %   MCV 88.0 80.0 - 100.0 fL   MCH 29.6 27.0 - 33.0 pg   MCHC 33.7 32.0 - 36.0 g/dL   RDW 13.4 11.0 - 15.0 %   Platelets 240 140 - 400 Thousand/uL   MPV 11.0 7.5 - 12.5 fL   Neutro Abs 3,865 1,500 - 7,800 cells/uL   Lymphs Abs 1,362 850 - 3,900 cells/uL   Absolute Monocytes 336 200 - 950 cells/uL   Eosinophils Absolute 108 15 - 500 cells/uL   Basophils Absolute 29 0 - 200 cells/uL   Neutrophils Relative % 67.8 %   Total Lymphocyte 23.9 %   Monocytes Relative 5.9 %   Eosinophils Relative 1.9 %   Basophils Relative 0.5 %  Lipid panel     Status: Abnormal    Collection Time: 09/26/19 12:00 AM  Result Value  Ref Range   Cholesterol 252 (H) <200 mg/dL   HDL 56 > OR = 50 mg/dL   Triglycerides 167 (H) <150 mg/dL   LDL Cholesterol (Calc) 164 (H) mg/dL (calc)    Comment: Reference range: <100 . Desirable range <100 mg/dL for primary prevention;   <70 mg/dL for patients with CHD or diabetic patients  with > or = 2 CHD risk factors. Marland Kitchen LDL-C is now calculated using the Martin-Hopkins  calculation, which is a validated novel method providing  better accuracy than the Friedewald equation in the  estimation of LDL-C.  Cresenciano Genre et al. Annamaria Helling. 8938;101(75): 2061-2068  (http://education.[facility].com/faq/FAQ164)    Total CHOL/HDL Ratio 4.5 <5.0 (calc)   Non-HDL Cholesterol (Calc) 196 (H) <130 mg/dL (calc)    Comment: For patients with diabetes plus 1 major ASCVD risk  factor, treating to a non-HDL-C goal of <100 mg/dL  (LDL-C of <70 mg/dL) is considered a therapeutic  option.   Hemoglobin A1c     Status: None   Collection Time: 09/26/19 12:00 AM  Result Value Ref Range   Hgb A1c MFr Bld 5.4 <5.7 % of total Hgb    Comment: For the purpose of screening for the presence of diabetes: . <5.7%       Consistent with the absence of diabetes 5.7-6.4%    Consistent with increased risk for diabetes             (prediabetes) > or =6.5%  Consistent with diabetes . This assay result is consistent with a decreased risk of diabetes. . Currently, no consensus exists regarding use of hemoglobin A1c for diagnosis of diabetes in children. . According to American Diabetes Association (ADA) guidelines, hemoglobin A1c <7.0% represents optimal control in non-pregnant diabetic patients. Different metrics may apply to specific patient populations.  Standards of Medical Care in Diabetes(ADA). .    Mean Plasma Glucose 108 (calc)   eAG (mmol/L) 6.0 (calc)  Intrinsic Factor Antibodies     Status: None   Collection Time: 09/26/19 12:00 AM  Result Value Ref  Range   Intrinsic Factor Negative Negative    Comment: . For additional information, please refer to http://education.[facility].com/faq/IFAB (This link is being provided for informational/  educational purposes only.) .   Anti-Parietal Antibody     Status: None   Collection Time: 09/26/19 12:00 AM  Result Value Ref Range   PARIETAL CELL AB SCREEN NEGATIVE NEGATIVE  Cancer antigen 27.29     Status: None   Collection Time: 09/26/19 12:00 AM  Result Value Ref Range   CA 27.29 29 <38 U/mL    Comment: . This test was performed using the Siemens Chemiluminescent method. Values obtained from different assay methods cannot be used interchangeably. CA 27.29 levels, regardless of value, should not be interpreted as absolute evidence of the presence or absence of disease. .       Fall Risk: Fall Risk  10/12/2019 07/06/2019 03/31/2019 12/31/2018 09/30/2018  Falls in the past year? 1 0 0 0 1  Comment - - - - -  Number falls in past yr: 0 0 0 - 1  Comment Walking her dog after it rained - - - -  Injury with Fall? 1 0 0 - 0  Comment Contusion on her left knee - - - -  Risk for fall due to : - - - - Other (Comment)  Risk for fall due to: Comment - - - - She trips easily  Follow up - Falls evaluation completed - - -  Functional Status Survey: Is the patient deaf or have difficulty hearing?: No Does the patient have difficulty seeing, even when wearing glasses/contacts?: No Does the patient have difficulty concentrating, remembering, or making decisions?: No Does the patient have difficulty walking or climbing stairs?: No Does the patient have difficulty dressing or bathing?: No Does the patient have difficulty doing errands alone such as visiting a doctor's office or shopping?: No   Assessment & Plan  1. Fibromyalgia syndrome  - gabapentin (NEURONTIN) 400 MG capsule; Take 1 capsule (400 mg total) by mouth at bedtime.  Dispense: 90 capsule; Refill: 1  2. Benign essential  HTN  - amLODipine (NORVASC) 10 MG tablet; Take 1 tablet (10 mg total) by mouth daily. At night  Dispense: 90 tablet; Refill: 0  3. Anxiety  - ALPRAZolam (XANAX) 0.5 MG tablet; TAKE 1 TABLET BY MOUTH TWICE A DAY AS NEEDED FOR ANXIETY  Dispense: 30 tablet; Refill: 0  4. Chronic nasal congestion  - Ambulatory referral to ENT  5. Post-nasal drainage  - Ambulatory referral to ENT  -USPSTF grade A and B recommendations reviewed with patient; age-appropriate recommendations, preventive care, screening tests, etc discussed and encouraged; healthy living encouraged; see AVS for patient education given to patient -Discussed importance of 150 minutes of physical activity weekly, eat two servings of fish weekly, eat one serving of tree nuts ( cashews, pistachios, pecans, almonds.Marland Kitchen) every other day, eat 6 servings of fruit/vegetables daily and drink plenty of water and avoid sweet beverages.

## 2019-10-14 ENCOUNTER — Telehealth: Payer: Self-pay

## 2019-10-14 NOTE — Telephone Encounter (Signed)
The patient was refered to [facility] ENT and has been schedule for 10/18/2019 @ 10:30 AM. The patient was understanding.

## 2019-11-11 ENCOUNTER — Ambulatory Visit (INDEPENDENT_AMBULATORY_CARE_PROVIDER_SITE_OTHER): Payer: BLUE CROSS/BLUE SHIELD

## 2019-11-11 ENCOUNTER — Other Ambulatory Visit: Payer: Self-pay

## 2019-11-11 DIAGNOSIS — E538 Deficiency of other specified B group vitamins: Secondary | ICD-10-CM

## 2019-11-11 MED ORDER — CYANOCOBALAMIN 1000 MCG/ML IJ SOLN
1000.0000 ug | Freq: Once | INTRAMUSCULAR | Status: AC
  Administered 2019-11-11: 1000 ug via INTRAMUSCULAR

## 2019-12-09 ENCOUNTER — Other Ambulatory Visit: Payer: Self-pay

## 2019-12-09 ENCOUNTER — Ambulatory Visit (INDEPENDENT_AMBULATORY_CARE_PROVIDER_SITE_OTHER): Payer: 59

## 2019-12-09 DIAGNOSIS — E538 Deficiency of other specified B group vitamins: Secondary | ICD-10-CM | POA: Diagnosis not present

## 2019-12-09 NOTE — Progress Notes (Signed)
Patient came in for her B-12 injection. She tolerated it well. NKDA.   

## 2019-12-26 ENCOUNTER — Ambulatory Visit
Admission: RE | Admit: 2019-12-26 | Discharge: 2019-12-26 | Disposition: A | Discharge status: Home / Self Care | Payer: 59 | Source: Ambulatory Visit | Attending: Hematology and Oncology | Admitting: Hematology and Oncology

## 2019-12-26 ENCOUNTER — Ambulatory Visit: Payer: BLUE CROSS/BLUE SHIELD

## 2019-12-26 DIAGNOSIS — Z171 Estrogen receptor negative status [ER-]: Secondary | ICD-10-CM | POA: Diagnosis not present

## 2019-12-26 DIAGNOSIS — Z1231 Encounter for screening mammogram for malignant neoplasm of breast: Secondary | ICD-10-CM | POA: Diagnosis present

## 2019-12-26 DIAGNOSIS — C50412 Malignant neoplasm of upper-outer quadrant of left female breast: Secondary | ICD-10-CM | POA: Insufficient documentation

## 2019-12-26 HISTORY — DX: Personal history of antineoplastic chemotherapy: Z92.21

## 2020-01-05 ENCOUNTER — Other Ambulatory Visit: Payer: Self-pay | Admitting: Family Medicine

## 2020-01-05 DIAGNOSIS — I10 Essential (primary) hypertension: Secondary | ICD-10-CM

## 2020-01-13 ENCOUNTER — Other Ambulatory Visit: Payer: Self-pay

## 2020-01-13 ENCOUNTER — Encounter: Payer: Self-pay | Admitting: Family Medicine

## 2020-01-13 ENCOUNTER — Ambulatory Visit (INDEPENDENT_AMBULATORY_CARE_PROVIDER_SITE_OTHER): Payer: 59 | Admitting: Family Medicine

## 2020-01-13 VITALS — BP 118/74 | HR 97 | Temp 97.5°F | Resp 16 | Ht 60.03 in | Wt 165.0 lb

## 2020-01-13 DIAGNOSIS — D8989 Other specified disorders involving the immune mechanism, not elsewhere classified: Secondary | ICD-10-CM

## 2020-01-13 DIAGNOSIS — M797 Fibromyalgia: Secondary | ICD-10-CM

## 2020-01-13 DIAGNOSIS — R634 Abnormal weight loss: Secondary | ICD-10-CM

## 2020-01-13 DIAGNOSIS — F338 Other recurrent depressive disorders: Secondary | ICD-10-CM

## 2020-01-13 DIAGNOSIS — E538 Deficiency of other specified B group vitamins: Secondary | ICD-10-CM

## 2020-01-13 DIAGNOSIS — Z1211 Encounter for screening for malignant neoplasm of colon: Secondary | ICD-10-CM

## 2020-01-13 DIAGNOSIS — I10 Essential (primary) hypertension: Secondary | ICD-10-CM | POA: Diagnosis not present

## 2020-01-13 DIAGNOSIS — R5382 Chronic fatigue, unspecified: Secondary | ICD-10-CM

## 2020-01-13 DIAGNOSIS — K219 Gastro-esophageal reflux disease without esophagitis: Secondary | ICD-10-CM

## 2020-01-13 DIAGNOSIS — J300 Vasomotor rhinitis: Secondary | ICD-10-CM

## 2020-01-13 DIAGNOSIS — E785 Hyperlipidemia, unspecified: Secondary | ICD-10-CM

## 2020-01-13 MED ORDER — CYANOCOBALAMIN 1000 MCG/ML IJ SOLN
1000.0000 ug | Freq: Once | INTRAMUSCULAR | Status: AC
  Administered 2020-01-13: 1000 ug via INTRAMUSCULAR

## 2020-01-13 MED ORDER — AMLODIPINE BESYLATE 10 MG PO TABS: 10.0000 mg | ORAL_TABLET | Freq: Every day | ORAL | 0 refills | Status: DC

## 2020-01-13 NOTE — Progress Notes (Signed)
vasomeName: Deanna Baker   MRN: [id number]    DOB: March 20, 1961   Date:01/13/2020       Progress Note  Subjective  Chief Complaint  Chief Complaint  Patient presents with  . Medication Refill  . Depression  . Fibromyalgia    Legs feel weak today  . Hypertension  . Gastroesophageal Reflux    Unchanged-stopped her CPAP in awhile to see if that was causing her sore throat  . Weight Loss    Lost 10 more pounds since last visit 4 months ago-not trying to lose weight. Appetite has been lower than usually. Eating half of her normal portion    HPI  GERD: she went to see Dr. Durwin Reges but colonoscopy and EGD and is ready to go back now since she changed her insurance. No blood in stools.   HTN: bp has been high lately, she has been taking norvasc daily but Chlorthalidone only prn, advised to take diuretic half pill daily and monitor. She has intermittent headache, no chest pain or palpitation   Seasonal Affective disorder: she has symptoms when cold , her pain is worse, she is unable to be as active during winter months and mood goes down.   Weight loss: she  lost 18 lbs over the past 12 months, and of those 10 lbs was in the past 3 months. She has not changed her diet or exercise level, but she stopped gabapentin Fall of 2020 . She had multiple labs done 09/2019. She is up to date with mammogram . She is behind on her colonoscopy, she was supposed to have it done in 04/2019 but at the time they did not accept her insurance. We will place a referral to Dr. Durwin Reges since her insurance has changed   FMS: she stopped  Gabapentin on her own, she has celebrex but only taking it prn. She states she has pain all over pain at this time is 3/10, but still has constant fatigue and mental fogginess.   Vasomotor rhinitis she went back to Dr. Tami Ribas for recurrent clear rhinorrhea and vomiting when showering, had a CT sinus that per patient was negative ( unable to see it) she was given nasal spray but not using  it. She also gags when brushing her teeth, she needs to discuss symptoms with Dr. Durwin Reges also  Dyslipidemia: low risk of heart attack and strokes in the next 10 years and she said she would not take a statin anyways  The 10-year ASCVD risk score Mikey Bussing DC Brooke Bonito., et al., 2013) is: 4.1%   Values used to calculate the score:     Age: 59 years     Sex: Female     Is Non-Hispanic African American: No     Diabetic: No     Tobacco smoker: No     Systolic Blood Pressure: 123456 mmHg     Is BP treated: Yes     HDL Cholesterol: 56 mg/dL     Total Cholesterol: 252 mg/dL   Patient Active Problem List   Diagnosis Date Noted  . Anemia 04/04/2018  . B12 deficiency 03/29/2018  . Cognitive decline 01/02/2017  . Chronic kidney disease, stage 3 09/23/2016  . Sleep apnea 07/11/2016  . ASCUS favor benign 07/10/2016  . Memory loss 05/12/2016  . Smell or taste sensation disturbance 05/12/2016  . Personal history of colonic polyps   . Benign neoplasm of transverse colon   . Rectal polyp   . Tenosynovitis of thumb 03/12/2016  . Bilateral knee  pain 03/12/2016  . Reflux esophagitis   . Stricture and stenosis of esophagus   . Abnormal findings-gastrointestinal tract   . Thickening of esophagus 08/03/2015  . Choking 07/04/2015  . Benign essential HTN 05/01/2015  . CFIDS (chronic fatigue and immune dysfunction syndrome) ([place]) 05/01/2015  . Major depression, recurrent, chronic ([place]) 05/01/2015  . Dyslipidemia 05/01/2015  . Fibromyalgia syndrome 05/01/2015  . Blood glucose elevated 05/01/2015  . Eczema intertrigo 05/01/2015  . Iron deficiency anemia due to chronic blood loss 05/01/2015  . Migraine without aura and without status migrainosus, not intractable 05/01/2015  . Excessive urination at night 05/01/2015  . Dysmetabolic syndrome [id number]  . History of colonoscopy with polypectomy 05/01/2015  . Central sleep apnea 05/01/2015  . History of back surgery 05/01/2015  . Thickened nails 05/01/2015     Past Surgical History:  Procedure Laterality Date  . BACK SURGERY    . BREAST BIOPSY Left 10/2012   +  . 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 BREAST LUMPECTOMY Left 12/06/12    lumpectomy with SN biopsy and power port placement...  . COLONOSCOPY  2013   Dr. Suzette Battiest   . COLONOSCOPY WITH PROPOFOL N/A 04/28/2016   Procedure: COLONOSCOPY WITH PROPOFOL;  Surgeon: Lucilla Lame, MD;  Location: [place];  Service: Endoscopy;  Laterality: N/A;  CPAP  . ESOPHAGOGASTRODUODENOSCOPY (EGD) WITH PROPOFOL N/A 10/23/2015   Procedure: ESOPHAGOGASTRODUODENOSCOPY (EGD) WITH PROPOFOL;  Surgeon: Lucilla Lame, MD;  Location: [facility];  Service: Endoscopy;  Laterality: N/A;  . POLYPECTOMY  04/28/2016   Procedure: POLYPECTOMY;  Surgeon: Lucilla Lame, MD;  Location: [place];  Service: Endoscopy;;  . SHOULDER SURGERY Left 2009  . TONSILLECTOMY  1965  . TUBAL LIGATION  1982  . UPPER GI ENDOSCOPY     Dr. Suzette Battiest    Family History  Problem Relation Age of Onset  . Breast cancer Mother 89  . Diabetes Mother   . Gout Mother   . Kidney failure Mother   . Hypertension Mother   . Congestive Heart Failure Mother   . Stomach cancer Maternal Aunt   . Brain cancer Paternal Uncle        x 2 mat uncles  . Heart attack Maternal Grandmother   . Hypertension Maternal Grandmother   . Stroke Maternal Grandmother   . Epilepsy Maternal Aunt     Social History   Tobacco Use  . Smoking status: Former Smoker    Packs/day: 0.50    Years: 4.00    Pack years: 2.00    Types: Cigarettes    Start date: 11/24/1976    Quit date: 11/24/1980    Years since quitting: 39.1  . Smokeless tobacco: Never Used  Substance Use Topics  . Alcohol use: Yes    Alcohol/week: 0.0 standard drinks    Comment: rarely, 1-2x/mo glass of wine  . Drug use: No     Current Outpatient Medications:  .  amLODipine (NORVASC) 10 MG tablet, TAKE 1 TABLET (10 MG TOTAL) BY MOUTH DAILY. AT  NIGHT, Disp: 90 tablet, Rfl: 0 .  chlorthalidone (HYGROTON) 25 MG tablet, Take 0.5 tablets (12.5 mg total) by mouth daily. In am (Patient taking differently: Take 12.5 mg by mouth as needed. In am), Disp: 45 tablet, Rfl: 0 .  celecoxib (CELEBREX) 200 MG capsule, TAKE 1 CAPSULE BY MOUTH EVERY DAY (Patient not taking: Reported on 01/13/2020), Disp: 30 capsule, Rfl: 0 .  Nerve Stimulator (STANDARD TENS) DEVI, 1  Units by Does not apply route daily. (Patient not taking: Reported on 01/13/2020), Disp: 1 Device, Rfl: 0  Allergies  Allergen Reactions  . Ace Inhibitors Swelling  . Benicar [Olmesartan] Other (See Comments)    Chest Pressure and Allergic Reaction  . Pregabalin     weight gain and blurred vision  . Venlafaxine     foggy minded    I personally reviewed active problem list, medication list, allergies, family history, social history, health maintenance with the patient/caregiver today.   ROS  Constitutional: Negative for fever , positive for weight change.  Respiratory: Negative for cough and shortness of breath.   Cardiovascular: Negative for chest pain or palpitations.  Gastrointestinal: Negative for abdominal pain, no bowel changes.  Musculoskeletal: Negative for gait problem or joint swelling.  Skin: Negative for rash.  Neurological: Negative for dizziness or headache.  No other specific complaints in a complete review of systems (except as listed in HPI above).  Objective  Vitals:   01/13/20 1032  BP: 118/74  Pulse: 97  Resp: 16  Temp: (!) 97.5 F (36.4 C)  TempSrc: Temporal  SpO2: 98%  Weight: 165 lb (74.8 kg)  Height: 5' 0.03" (1.525 m)    Body mass index is 32.2 kg/m.  Physical Exam  Constitutional: Patient appears well-developed and well-nourished. Obese  No distress.  HEENT: head atraumatic, normocephalic, pupils equal and reactive to lightt, neck supple Cardiovascular: Normal rate, regular rhythm and normal heart sounds.  No murmur heard. No BLE  edema. Pulmonary/Chest: Effort normal and breath sounds normal. No respiratory distress. Abdominal: Soft.  There is no tenderness. Psychiatric: Patient has a normal mood and affect. behavior is normal. Judgment and thought content normal. Muscular Skeletal: trigger point positive   PHQ2/9: Depression screen [facility] 2/9 01/13/2020 10/12/2019 07/06/2019 03/31/2019 12/31/2018  Decreased Interest 0 0 0 0 1  Down, Depressed, Hopeless 1 0 0 0 0  PHQ - 2 Score 1 0 0 0 1  Altered sleeping 0 0 0 1 0  Tired, decreased energy 2 2 0 1 1  Change in appetite 2 0 0 0 0  Feeling bad or failure about yourself  1 0 0 0 0  Trouble concentrating 0 1 0 0 0  Moving slowly or fidgety/restless 0 0 0 1 1  Suicidal thoughts 0 0 0 0 0  PHQ-9 Score 6 3 0 3 3  Difficult doing work/chores Somewhat difficult - Not difficult at all Not difficult at all Not difficult at all  Some recent data might be hidden    phq 9 is positive   Fall Risk: Fall Risk  01/13/2020 10/12/2019 07/06/2019 03/31/2019 12/31/2018  Falls in the past year? 0 1 0 0 0  Comment - - - - -  Number falls in past yr: 0 0 0 0 -  Comment - Walking her dog after it rained - - -  Injury with Fall? 0 1 0 0 -  Comment - Contusion on her left knee - - -  Risk for fall due to : - - - - -  Risk for fall due to: Comment - - - - -  Follow up - - Falls evaluation completed - -     Functional Status Survey: Is the patient deaf or have difficulty hearing?: No Does the patient have difficulty seeing, even when wearing glasses/contacts?: No Does the patient have difficulty concentrating, remembering, or making decisions?: No Does the patient have difficulty walking or climbing stairs?: No Does the patient  have difficulty dressing or bathing?: No Does the patient have difficulty doing errands alone such as visiting a doctor's office or shopping?: No    Assessment & Plan  1. Seasonal affective disorder ([place])  She does not want medications  2. B12  deficiency  - cyanocobalamin ((VITAMIN B-12)) injection 1,000 mcg  3. Benign essential HTN  - amLODipine (NORVASC) 10 MG tablet; Take 1 tablet (10 mg total) by mouth daily. At night  Dispense: 90 tablet; Refill: 0  4. Fibromyalgia syndrome  Feeling worse   5. Dyslipidemia  Low ASCVD   6. CFIDS (chronic fatigue and immune dysfunction syndrome) ([facility])  Worse this time of the year   7. Weight loss  - Ambulatory referral to Gastroenterology  8. Gastroesophageal reflux disease without esophagitis  - Ambulatory referral to Gastroenterology  9. Colon cancer screening  - Ambulatory referral to Gastroenterology

## 2020-01-13 NOTE — Patient Instructions (Signed)
Vasomotor rhinitis

## 2020-02-10 ENCOUNTER — Ambulatory Visit (INDEPENDENT_AMBULATORY_CARE_PROVIDER_SITE_OTHER): Payer: 59

## 2020-02-10 ENCOUNTER — Other Ambulatory Visit: Payer: Self-pay

## 2020-02-10 DIAGNOSIS — E538 Deficiency of other specified B group vitamins: Secondary | ICD-10-CM | POA: Diagnosis not present

## 2020-02-10 MED ORDER — CYANOCOBALAMIN 1000 MCG/ML IJ SOLN
1000.0000 ug | Freq: Once | INTRAMUSCULAR | Status: AC
  Administered 2020-02-10: 1000 ug via INTRAMUSCULAR

## 2020-03-12 ENCOUNTER — Other Ambulatory Visit: Payer: Self-pay

## 2020-03-12 ENCOUNTER — Ambulatory Visit (INDEPENDENT_AMBULATORY_CARE_PROVIDER_SITE_OTHER): Payer: 59

## 2020-03-12 VITALS — Ht 60.25 in | Wt 165.3 lb

## 2020-03-12 DIAGNOSIS — E538 Deficiency of other specified B group vitamins: Secondary | ICD-10-CM | POA: Diagnosis not present

## 2020-03-12 MED ORDER — CYANOCOBALAMIN 1000 MCG/ML IJ SOLN
1000.0000 ug | Freq: Once | INTRAMUSCULAR | Status: AC
  Administered 2020-03-12: 1000 ug via INTRAMUSCULAR

## 2020-03-12 NOTE — Progress Notes (Signed)
Patient in office for B12 injection. Patient given B12 injection in right deltoid. Patient tolerated well, no adverse reaction.

## 2020-03-20 ENCOUNTER — Other Ambulatory Visit: Payer: Self-pay

## 2020-03-20 ENCOUNTER — Encounter: Payer: Self-pay | Admitting: Gastroenterology

## 2020-03-20 ENCOUNTER — Ambulatory Visit (INDEPENDENT_AMBULATORY_CARE_PROVIDER_SITE_OTHER): Payer: 59 | Admitting: Gastroenterology

## 2020-03-20 VITALS — BP 153/87 | HR 92 | Temp 98.2°F | Ht 60.0 in | Wt 163.2 lb

## 2020-03-20 DIAGNOSIS — R634 Abnormal weight loss: Secondary | ICD-10-CM

## 2020-03-20 DIAGNOSIS — K219 Gastro-esophageal reflux disease without esophagitis: Secondary | ICD-10-CM | POA: Diagnosis not present

## 2020-03-20 NOTE — Progress Notes (Signed)
Gastroenterology Consultation  Referring Provider:     Steele Sizer, MD Primary Care Physician:  Steele Sizer, MD Primary Gastroenterologist:  Dr. Allen Norris     Reason for Consultation:     Weight loss with GERD        HPI:   Deanna Baker is a 59 y.o. y/o female referred for consultation & management of weight loss with GERD by Dr. Ancil Boozer, Drue Stager, MD.  This patient comes in today reporting a weight loss that has been unintentional.  The patient has a history of sessile serrated adenoma's and adenomatous polyps in the past.  She also reports that she has not had any diarrhea or constipation.  The patient was on a PPI and states that she became B12 deficient so therefore she stopped the PPI.  She was seen by ENT for what she thought was postnasal drip from sinusitis.  The patient states that in the morning when she take a shower her postnasal drip causes her to start vomiting and she was told that her sinusitis may be caused by her acid reflux.  The patient states that she is eating well he usually eats 2 meals a day with them being breakfast and dinner.  She denies any change in dietary habits that would explain her weight loss.  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 The patient denies any abdominal pain but states that she suffers from fibromyalgia and has chronic pain.  In 2016 the patient had an upper endoscopy by me that showed her to have a lower esophageal stenosis which was dilated with a finding of esophagitis.  Past Medical History:  Diagnosis Date  . Anemia   . Arthritis 1990  . Breast cancer ([place]) 2014  . Bronchitis 5/17  . Chronic fatigue syndrome   . Colon polyp 2013  . Fibromyalgia 1993  . GERD (gastroesophageal reflux disease)   . Heart murmur   . Hypertension 2012  . Lump or mass in breast   . Malignant neoplasm of upper-outer quadrant of female breast ([place])    Invasive mammary carcinoma, triple negative  . Measles   . Mumps 1973    . MVP (mitral valve prolapse)   . Obstructive sleep apnea on CPAP 11/27/15  . Personal history of chemotherapy   . Personal history of radiation therapy    left  . Shortness of breath dyspnea    chronic fatigue/fibromyalgia    Past Surgical History:  Procedure Laterality Date  . BACK SURGERY    . BREAST BIOPSY Left 10/2012   +  . 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 BREAST LUMPECTOMY Left 12/06/12    lumpectomy with SN biopsy and power port placement...  . COLONOSCOPY  2013   Dr. Suzette Battiest   . COLONOSCOPY WITH PROPOFOL N/A 04/28/2016   Procedure: COLONOSCOPY WITH PROPOFOL;  Surgeon: Lucilla Lame, MD;  Location: [place];  Service: Endoscopy;  Laterality: N/A;  CPAP  . ESOPHAGOGASTRODUODENOSCOPY (EGD) WITH PROPOFOL N/A 10/23/2015   Procedure: ESOPHAGOGASTRODUODENOSCOPY (EGD) WITH PROPOFOL;  Surgeon: Lucilla Lame, MD;  Location: [facility];  Service: Endoscopy;  Laterality: N/A;  . POLYPECTOMY  04/28/2016   Procedure: POLYPECTOMY;  Surgeon: Lucilla Lame, MD;  Location: [place];  Service: Endoscopy;;  . SHOULDER SURGERY Left 2009  . TONSILLECTOMY  1965  . TUBAL LIGATION  1982  . UPPER GI ENDOSCOPY     Dr. Suzette Battiest    Prior  to Admission medications   Medication Sig Start Date End Date Taking? Authorizing Provider  amLODipine (NORVASC) 10 MG tablet Take 1 tablet (10 mg total) by mouth daily. At night 01/13/20  Yes Sowles, Drue Stager, MD  chlorthalidone (HYGROTON) 25 MG tablet Take 0.5 tablets (12.5 mg total) by mouth daily. In am Patient taking differently: Take 12.5 mg by mouth as needed. In am 09/30/18  Yes Sowles, Drue Stager, MD  celecoxib (CELEBREX) 200 MG capsule TAKE 1 CAPSULE BY MOUTH EVERY DAY Patient not taking: Reported on 01/13/2020 08/28/19   Steele Sizer, MD  Nerve Stimulator (STANDARD TENS) DEVI 1 Units by Does not apply route daily. Patient not taking: Reported on 01/13/2020 03/12/16   Steele Sizer, MD    Family  History  Problem Relation Age of Onset  . Breast cancer Mother 62  . Diabetes Mother   . Gout Mother   . Kidney failure Mother   . Hypertension Mother   . Congestive Heart Failure Mother   . Stomach cancer Maternal Aunt   . Brain cancer Paternal Uncle        x 2 mat uncles  . Heart attack Maternal Grandmother   . Hypertension Maternal Grandmother   . Stroke Maternal Grandmother   . Epilepsy Maternal Aunt      Social History   Tobacco Use  . Smoking status: Former Smoker    Packs/day: 0.50    Years: 4.00    Pack years: 2.00    Types: Cigarettes    Start date: 11/24/1976    Quit date: 11/24/1980    Years since quitting: 39.3  . Smokeless tobacco: Never Used  Substance Use Topics  . Alcohol use: Yes    Alcohol/week: 0.0 standard drinks    Comment: rarely, 1-2x/mo glass of wine  . Drug use: No    Allergies as of 03/20/2020 - Review Complete 03/20/2020  Allergen Reaction Noted  . Ace inhibitors Swelling 02/04/2016  . Benicar [olmesartan] Other (See Comments) 09/30/2018  . Pregabalin  05/01/2015  . Venlafaxine  05/01/2015    Review of Systems:    All systems reviewed and negative except where noted in HPI.   Physical Exam:  BP (!) 153/87   Pulse 92   Temp 98.2 F (36.8 C) (Oral)   Ht 5' (1.524 m)   Wt 163 lb 3.2 oz (74 kg)   BMI 31.87 kg/m  No LMP recorded. Patient is postmenopausal. General:   Alert,  Well-developed, well-nourished, pleasant and cooperative in NAD Head:  Normocephalic and atraumatic. Eyes:  Sclera clear, no icterus.   Conjunctiva pink. Ears:  Normal auditory acuity. Neck:  Supple; no masses or thyromegaly. Lungs:  Respirations even and unlabored.  Clear throughout to auscultation.   No wheezes, crackles, or rhonchi. No acute distress. Heart:  Regular rate and rhythm; no murmurs, clicks, rubs, or gallops. Abdomen:  Normal bowel sounds.  No bruits.  Soft, non-tender and non-distended without masses, hepatosplenomegaly or hernias noted.  No  guarding or rebound tenderness.  Negative Carnett sign.   Rectal:  Deferred.  Pulses:  Normal pulses noted. Extremities:  No clubbing or edema.  No cyanosis. Neurologic:  Alert and oriented x3;  grossly normal neurologically. Skin:  Intact without significant lesions or rashes.  No jaundice. Lymph Nodes:  No significant cervical adenopathy. Psych:  Alert and cooperative. Normal mood and affect.  Imaging Studies: No results found.  Assessment and Plan:   LOENA PORRITT is a 59 y.o. y/o female who comes in today with  a history of unintentional weight loss.  The patient states that she has not had any change in bowel habits.  The patient stopped her PPI because she became B12 deficient and has been taking shots recently.  She reports that her upper GI symptoms have gotten worse with her having sinusitis from her reflux as reported by ENT with vomiting from the postnasal drip.  The patient will be set up for an EGD and colonoscopy.  The patient has been told that since she is already getting B12 shots she may want to reconsider starting the PPI back again to see if that helps her symptoms.  The patient has been explained the plan and agrees with it.    Lucilla Lame, MD. Marval Regal    Note: This dictation was prepared with Dragon dictation along with smaller phrase technology. Any transcriptional errors that result from this process are unintentional.

## 2020-03-20 NOTE — H&P (View-Only) (Signed)
Gastroenterology Consultation  Referring Provider:     Steele Sizer, MD Primary Care Physician:  Steele Sizer, MD Primary Gastroenterologist:  Dr. Allen Norris     Reason for Consultation:     Weight loss with GERD        HPI:   Deanna Baker is a 59 y.o. y/o female referred for consultation & management of weight loss with GERD by Dr. Ancil Boozer, Drue Stager, MD.  This patient comes in today reporting a weight loss that has been unintentional.  The patient has a history of sessile serrated adenoma's and adenomatous polyps in the past.  She also reports that she has not had any diarrhea or constipation.  The patient was on a PPI and states that she became B12 deficient so therefore she stopped the PPI.  She was seen by ENT for what she thought was postnasal drip from sinusitis.  The patient states that in the morning when she take a shower her postnasal drip causes her to start vomiting and she was told that her sinusitis may be caused by her acid reflux.  The patient states that she is eating well he usually eats 2 meals a day with them being breakfast and dinner.  She denies any change in dietary habits that would explain her weight loss.  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 The patient denies any abdominal pain but states that she suffers from fibromyalgia and has chronic pain.  In 2016 the patient had an upper endoscopy by me that showed her to have a lower esophageal stenosis which was dilated with a finding of esophagitis.  Past Medical History:  Diagnosis Date  . Anemia   . Arthritis 1990  . Breast cancer ([place]) 2014  . Bronchitis 5/17  . Chronic fatigue syndrome   . Colon polyp 2013  . Fibromyalgia 1993  . GERD (gastroesophageal reflux disease)   . Heart murmur   . Hypertension 2012  . Lump or mass in breast   . Malignant neoplasm of upper-outer quadrant of female breast ([place])    Invasive mammary carcinoma, triple negative  . Measles   . Mumps 1973   . MVP (mitral valve prolapse)   . Obstructive sleep apnea on CPAP 11/27/15  . Personal history of chemotherapy   . Personal history of radiation therapy    left  . Shortness of breath dyspnea    chronic fatigue/fibromyalgia    Past Surgical History:  Procedure Laterality Date  . BACK SURGERY    . BREAST BIOPSY Left 10/2012   +  . 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 BREAST LUMPECTOMY Left 12/06/12    lumpectomy with SN biopsy and power port placement...  . COLONOSCOPY  2013   Dr. Suzette Battiest   . COLONOSCOPY WITH PROPOFOL N/A 04/28/2016   Procedure: COLONOSCOPY WITH PROPOFOL;  Surgeon: Lucilla Lame, MD;  Location: [place];  Service: Endoscopy;  Laterality: N/A;  CPAP  . ESOPHAGOGASTRODUODENOSCOPY (EGD) WITH PROPOFOL N/A 10/23/2015   Procedure: ESOPHAGOGASTRODUODENOSCOPY (EGD) WITH PROPOFOL;  Surgeon: Lucilla Lame, MD;  Location: [facility] ENDOSCOPY;  Service: Endoscopy;  Laterality: N/A;  . POLYPECTOMY  04/28/2016   Procedure: POLYPECTOMY;  Surgeon: Lucilla Lame, MD;  Location: [place];  Service: Endoscopy;;  . SHOULDER SURGERY Left 2009  . TONSILLECTOMY  1965  . TUBAL LIGATION  1982  . UPPER GI ENDOSCOPY     Dr. Suzette Battiest    Prior to  Admission medications   Medication Sig Start Date End Date Taking? Authorizing Provider  amLODipine (NORVASC) 10 MG tablet Take 1 tablet (10 mg total) by mouth daily. At night 01/13/20  Yes Sowles, Drue Stager, MD  chlorthalidone (HYGROTON) 25 MG tablet Take 0.5 tablets (12.5 mg total) by mouth daily. In am Patient taking differently: Take 12.5 mg by mouth as needed. In am 09/30/18  Yes Sowles, Drue Stager, MD  celecoxib (CELEBREX) 200 MG capsule TAKE 1 CAPSULE BY MOUTH EVERY DAY Patient not taking: Reported on 01/13/2020 08/28/19   Steele Sizer, MD  Nerve Stimulator (STANDARD TENS) DEVI 1 Units by Does not apply route daily. Patient not taking: Reported on 01/13/2020 03/12/16   Steele Sizer, MD    Family History   Problem Relation Age of Onset  . Breast cancer Mother 93  . Diabetes Mother   . Gout Mother   . Kidney failure Mother   . Hypertension Mother   . Congestive Heart Failure Mother   . Stomach cancer Maternal Aunt   . Brain cancer Paternal Uncle        x 2 mat uncles  . Heart attack Maternal Grandmother   . Hypertension Maternal Grandmother   . Stroke Maternal Grandmother   . Epilepsy Maternal Aunt      Social History   Tobacco Use  . Smoking status: Former Smoker    Packs/day: 0.50    Years: 4.00    Pack years: 2.00    Types: Cigarettes    Start date: 11/24/1976    Quit date: 11/24/1980    Years since quitting: 39.3  . Smokeless tobacco: Never Used  Substance Use Topics  . Alcohol use: Yes    Alcohol/week: 0.0 standard drinks    Comment: rarely, 1-2x/mo glass of wine  . Drug use: No    Allergies as of 03/20/2020 - Review Complete 03/20/2020  Allergen Reaction Noted  . Ace inhibitors Swelling 02/04/2016  . Benicar [olmesartan] Other (See Comments) 09/30/2018  . Pregabalin  05/01/2015  . Venlafaxine  05/01/2015    Review of Systems:    All systems reviewed and negative except where noted in HPI.   Physical Exam:  BP (!) 153/87   Pulse 92   Temp 98.2 F (36.8 C) (Oral)   Ht 5' (1.524 m)   Wt 163 lb 3.2 oz (74 kg)   BMI 31.87 kg/m  No LMP recorded. Patient is postmenopausal. General:   Alert,  Well-developed, well-nourished, pleasant and cooperative in NAD Head:  Normocephalic and atraumatic. Eyes:  Sclera clear, no icterus.   Conjunctiva pink. Ears:  Normal auditory acuity. Neck:  Supple; no masses or thyromegaly. Lungs:  Respirations even and unlabored.  Clear throughout to auscultation.   No wheezes, crackles, or rhonchi. No acute distress. Heart:  Regular rate and rhythm; no murmurs, clicks, rubs, or gallops. Abdomen:  Normal bowel sounds.  No bruits.  Soft, non-tender and non-distended without masses, hepatosplenomegaly or hernias noted.  No guarding or  rebound tenderness.  Negative Carnett sign.   Rectal:  Deferred.  Pulses:  Normal pulses noted. Extremities:  No clubbing or edema.  No cyanosis. Neurologic:  Alert and oriented x3;  grossly normal neurologically. Skin:  Intact without significant lesions or rashes.  No jaundice. Lymph Nodes:  No significant cervical adenopathy. Psych:  Alert and cooperative. Normal mood and affect.  Imaging Studies: No results found.  Assessment and Plan:   STARLEE ENSMINGER is a 59 y.o. y/o female who comes in today with a  history of unintentional weight loss.  The patient states that she has not had any change in bowel habits.  The patient stopped her PPI because she became B12 deficient and has been taking shots recently.  She reports that her upper GI symptoms have gotten worse with her having sinusitis from her reflux as reported by ENT with vomiting from the postnasal drip.  The patient will be set up for an EGD and colonoscopy.  The patient has been told that since she is already getting B12 shots she may want to reconsider starting the PPI back again to see if that helps her symptoms.  The patient has been explained the plan and agrees with it.    Lucilla Lame, MD. Marval Regal    Note: This dictation was prepared with Dragon dictation along with smaller phrase technology. Any transcriptional errors that result from this process are unintentional.

## 2020-04-04 ENCOUNTER — Other Ambulatory Visit: Payer: Self-pay

## 2020-04-04 DIAGNOSIS — K219 Gastro-esophageal reflux disease without esophagitis: Secondary | ICD-10-CM

## 2020-04-04 DIAGNOSIS — R634 Abnormal weight loss: Secondary | ICD-10-CM

## 2020-04-06 ENCOUNTER — Ambulatory Visit (INDEPENDENT_AMBULATORY_CARE_PROVIDER_SITE_OTHER): Payer: 59

## 2020-04-06 ENCOUNTER — Ambulatory Visit: Payer: 59 | Admitting: Family Medicine

## 2020-04-06 ENCOUNTER — Other Ambulatory Visit: Payer: Self-pay

## 2020-04-06 ENCOUNTER — Other Ambulatory Visit
Admission: RE | Admit: 2020-04-06 | Discharge: 2020-04-06 | Disposition: A | Discharge status: Home / Self Care | Payer: 59 | Source: Ambulatory Visit | Attending: Gastroenterology | Admitting: Gastroenterology

## 2020-04-06 DIAGNOSIS — Z20822 Contact with and (suspected) exposure to covid-19: Secondary | ICD-10-CM | POA: Insufficient documentation

## 2020-04-06 DIAGNOSIS — E538 Deficiency of other specified B group vitamins: Secondary | ICD-10-CM | POA: Diagnosis not present

## 2020-04-06 DIAGNOSIS — Z01812 Encounter for preprocedural laboratory examination: Secondary | ICD-10-CM | POA: Diagnosis present

## 2020-04-06 MED ORDER — CYANOCOBALAMIN 1000 MCG/ML IJ SOLN
1000.0000 ug | INTRAMUSCULAR | Status: DC
  Administered 2020-04-06 – 2020-07-02 (×4): 1000 ug via INTRAMUSCULAR

## 2020-04-06 NOTE — Progress Notes (Signed)
Patient came in for her B-12 injection. She tolerated it well. NKDA.   

## 2020-04-07 LAB — SARS CORONAVIRUS 2 (TAT 6-24 HRS): SARS Coronavirus 2: NEGATIVE

## 2020-04-09 ENCOUNTER — Other Ambulatory Visit: Payer: Self-pay

## 2020-04-09 MED ORDER — SUPREP BOWEL PREP KIT 17.5-3.13-1.6 GM/177ML PO SOLN: 1.0000 | ORAL | 0 refills | Status: DC

## 2020-04-10 ENCOUNTER — Other Ambulatory Visit: Payer: Self-pay

## 2020-04-10 ENCOUNTER — Encounter: Payer: Self-pay | Admitting: Gastroenterology

## 2020-04-10 ENCOUNTER — Ambulatory Visit: Payer: 59 | Admitting: Anesthesiology

## 2020-04-10 ENCOUNTER — Encounter
Admission: RE | Disposition: A | Discharge status: Home / Self Care | Payer: Self-pay | Source: Home / Self Care | Attending: Gastroenterology

## 2020-04-10 ENCOUNTER — Ambulatory Visit
Admission: RE | Admit: 2020-04-10 | Discharge: 2020-04-10 | Disposition: A | Discharge status: Home / Self Care | Payer: 59 | Attending: Gastroenterology | Admitting: Gastroenterology

## 2020-04-10 DIAGNOSIS — Z833 Family history of diabetes mellitus: Secondary | ICD-10-CM | POA: Diagnosis not present

## 2020-04-10 DIAGNOSIS — K2100 Gastro-esophageal reflux disease with esophagitis, without bleeding: Secondary | ICD-10-CM | POA: Diagnosis not present

## 2020-04-10 DIAGNOSIS — R011 Cardiac murmur, unspecified: Secondary | ICD-10-CM | POA: Diagnosis not present

## 2020-04-10 DIAGNOSIS — I341 Nonrheumatic mitral (valve) prolapse: Secondary | ICD-10-CM | POA: Insufficient documentation

## 2020-04-10 DIAGNOSIS — Z9221 Personal history of antineoplastic chemotherapy: Secondary | ICD-10-CM | POA: Diagnosis not present

## 2020-04-10 DIAGNOSIS — D649 Anemia, unspecified: Secondary | ICD-10-CM | POA: Insufficient documentation

## 2020-04-10 DIAGNOSIS — K449 Diaphragmatic hernia without obstruction or gangrene: Secondary | ICD-10-CM | POA: Diagnosis not present

## 2020-04-10 DIAGNOSIS — R12 Heartburn: Secondary | ICD-10-CM | POA: Insufficient documentation

## 2020-04-10 DIAGNOSIS — Z8249 Family history of ischemic heart disease and other diseases of the circulatory system: Secondary | ICD-10-CM | POA: Insufficient documentation

## 2020-04-10 DIAGNOSIS — R5382 Chronic fatigue, unspecified: Secondary | ICD-10-CM | POA: Insufficient documentation

## 2020-04-10 DIAGNOSIS — R519 Headache, unspecified: Secondary | ICD-10-CM | POA: Insufficient documentation

## 2020-04-10 DIAGNOSIS — G709 Myoneural disorder, unspecified: Secondary | ICD-10-CM | POA: Insufficient documentation

## 2020-04-10 DIAGNOSIS — G4733 Obstructive sleep apnea (adult) (pediatric): Secondary | ICD-10-CM | POA: Insufficient documentation

## 2020-04-10 DIAGNOSIS — M797 Fibromyalgia: Secondary | ICD-10-CM | POA: Insufficient documentation

## 2020-04-10 DIAGNOSIS — Z86010 Personal history of colonic polyps: Secondary | ICD-10-CM | POA: Insufficient documentation

## 2020-04-10 DIAGNOSIS — K640 First degree hemorrhoids: Secondary | ICD-10-CM | POA: Insufficient documentation

## 2020-04-10 DIAGNOSIS — Z8349 Family history of other endocrine, nutritional and metabolic diseases: Secondary | ICD-10-CM | POA: Insufficient documentation

## 2020-04-10 DIAGNOSIS — R634 Abnormal weight loss: Secondary | ICD-10-CM | POA: Diagnosis not present

## 2020-04-10 DIAGNOSIS — Z841 Family history of disorders of kidney and ureter: Secondary | ICD-10-CM | POA: Insufficient documentation

## 2020-04-10 DIAGNOSIS — J329 Chronic sinusitis, unspecified: Secondary | ICD-10-CM | POA: Insufficient documentation

## 2020-04-10 DIAGNOSIS — E538 Deficiency of other specified B group vitamins: Secondary | ICD-10-CM | POA: Insufficient documentation

## 2020-04-10 DIAGNOSIS — R0602 Shortness of breath: Secondary | ICD-10-CM | POA: Insufficient documentation

## 2020-04-10 DIAGNOSIS — Z923 Personal history of irradiation: Secondary | ICD-10-CM | POA: Diagnosis not present

## 2020-04-10 DIAGNOSIS — Z87891 Personal history of nicotine dependence: Secondary | ICD-10-CM | POA: Insufficient documentation

## 2020-04-10 DIAGNOSIS — Z79899 Other long term (current) drug therapy: Secondary | ICD-10-CM | POA: Insufficient documentation

## 2020-04-10 DIAGNOSIS — Z823 Family history of stroke: Secondary | ICD-10-CM | POA: Insufficient documentation

## 2020-04-10 DIAGNOSIS — M1990 Unspecified osteoarthritis, unspecified site: Secondary | ICD-10-CM | POA: Insufficient documentation

## 2020-04-10 DIAGNOSIS — Z820 Family history of epilepsy and other diseases of the nervous system: Secondary | ICD-10-CM | POA: Insufficient documentation

## 2020-04-10 DIAGNOSIS — K219 Gastro-esophageal reflux disease without esophagitis: Secondary | ICD-10-CM

## 2020-04-10 DIAGNOSIS — Z853 Personal history of malignant neoplasm of breast: Secondary | ICD-10-CM | POA: Diagnosis not present

## 2020-04-10 DIAGNOSIS — Z803 Family history of malignant neoplasm of breast: Secondary | ICD-10-CM | POA: Insufficient documentation

## 2020-04-10 DIAGNOSIS — Z6831 Body mass index (BMI) 31.0-31.9, adult: Secondary | ICD-10-CM | POA: Diagnosis not present

## 2020-04-10 DIAGNOSIS — I10 Essential (primary) hypertension: Secondary | ICD-10-CM | POA: Insufficient documentation

## 2020-04-10 DIAGNOSIS — F329 Major depressive disorder, single episode, unspecified: Secondary | ICD-10-CM | POA: Insufficient documentation

## 2020-04-10 DIAGNOSIS — Z800 Family history of malignant neoplasm of digestive organs: Secondary | ICD-10-CM | POA: Insufficient documentation

## 2020-04-10 DIAGNOSIS — Z791 Long term (current) use of non-steroidal anti-inflammatories (NSAID): Secondary | ICD-10-CM | POA: Insufficient documentation

## 2020-04-10 DIAGNOSIS — Z808 Family history of malignant neoplasm of other organs or systems: Secondary | ICD-10-CM | POA: Insufficient documentation

## 2020-04-10 HISTORY — PX: ESOPHAGOGASTRODUODENOSCOPY (EGD) WITH PROPOFOL: SHX5813

## 2020-04-10 HISTORY — PX: COLONOSCOPY WITH PROPOFOL: SHX5780

## 2020-04-10 SURGERY — COLONOSCOPY WITH PROPOFOL
Anesthesia: General

## 2020-04-10 MED ORDER — GLYCOPYRROLATE 0.2 MG/ML IJ SOLN
INTRAMUSCULAR | Status: DC | PRN
  Administered 2020-04-10: .2 mg via INTRAVENOUS

## 2020-04-10 MED ORDER — LIDOCAINE 2% (20 MG/ML) 5 ML SYRINGE
INTRAMUSCULAR | Status: DC | PRN
  Administered 2020-04-10: 100 mg via INTRAVENOUS

## 2020-04-10 MED ORDER — PROPOFOL 10 MG/ML IV BOLUS
INTRAVENOUS | Status: DC | PRN
  Administered 2020-04-10: 80 mg via INTRAVENOUS

## 2020-04-10 MED ORDER — SODIUM CHLORIDE 0.9 % IV SOLN: INTRAVENOUS | Status: DC

## 2020-04-10 MED ORDER — PROPOFOL 500 MG/50ML IV EMUL
INTRAVENOUS | Status: AC
  Filled 2020-04-10: qty 50

## 2020-04-10 MED ORDER — PROPOFOL 500 MG/50ML IV EMUL
INTRAVENOUS | Status: DC | PRN
  Administered 2020-04-10: 150 ug/kg/min via INTRAVENOUS

## 2020-04-10 NOTE — Anesthesia Preprocedure Evaluation (Signed)
Anesthesia Evaluation  Patient identified by MRN, date of birth, ID band Patient awake    Reviewed: Allergy & Precautions, H&P , NPO status , Patient's Chart, lab work & pertinent test results, reviewed documented beta blocker date and time   Airway Mallampati: II   Neck ROM: full    Dental  (+) Poor Dentition   Pulmonary shortness of breath, sleep apnea , former smoker,    Pulmonary exam normal        Cardiovascular Exercise Tolerance: Poor hypertension, On Medications Normal cardiovascular exam+ Valvular Problems/Murmurs  Rhythm:regular Rate:Normal     Neuro/Psych  Headaches, PSYCHIATRIC DISORDERS Depression  Neuromuscular disease    GI/Hepatic Neg liver ROS, GERD  Medicated,  Endo/Other  negative endocrine ROS  Renal/GU Renal disease  negative genitourinary   Musculoskeletal   Abdominal   Peds  Hematology  (+) Blood dyscrasia, anemia ,   Anesthesia Other Findings Past Medical History: No date: Anemia 1990: Arthritis 2014: Breast cancer ([place]) 5/17: Bronchitis No date: Chronic fatigue syndrome 2013: Colon polyp 1993: Fibromyalgia No date: GERD (gastroesophageal reflux disease) No date: Heart murmur 2012: Hypertension No date: Lump or mass in breast No date: Malignant neoplasm of upper-outer quadrant of female breast  ([place])     Comment:  Invasive mammary carcinoma, triple negative No date: Measles 1973: Mumps No date: MVP (mitral valve prolapse) 11/27/15: Obstructive sleep apnea on CPAP No date: Personal history of chemotherapy No date: Personal history of radiation therapy     Comment:  left No date: Shortness of breath dyspnea     Comment:  chronic fatigue/fibromyalgia Past Surgical History: No date: BACK SURGERY 10/2012: BREAST BIOPSY; Left     Comment:  + 11/2012: BREAST EXCISIONAL BIOPSY; Left     Comment:  + triple neg breast ca 12/06/12: BREAST LUMPECTOMY; Left     Comment:   lumpectomy  with SN biopsy and power port placement... 2013: COLONOSCOPY     Comment:  Dr. Suzette Battiest  04/28/2016: COLONOSCOPY WITH PROPOFOL; N/A     Comment:  Procedure: COLONOSCOPY WITH PROPOFOL;  Surgeon: Lucilla Lame, MD;  Location: [place];  Service:               Endoscopy;  Laterality: N/A;  CPAP 10/23/2015: ESOPHAGOGASTRODUODENOSCOPY (EGD) WITH PROPOFOL; N/A     Comment:  Procedure: ESOPHAGOGASTRODUODENOSCOPY (EGD) WITH               PROPOFOL;  Surgeon: Lucilla Lame, MD;  Location: [facility]               ENDOSCOPY;  Service: Endoscopy;  Laterality: N/A; 04/28/2016: POLYPECTOMY     Comment:  Procedure: POLYPECTOMY;  Surgeon: Lucilla Lame, MD;                Location: [place];  Service: Endoscopy;; 2009: SHOULDER SURGERY; Left 1965: TONSILLECTOMY 1982: TUBAL LIGATION No date: UPPER GI ENDOSCOPY     Comment:  Dr. Suzette Battiest   Reproductive/Obstetrics negative OB ROS                             Anesthesia Physical Anesthesia Plan  ASA: III  Anesthesia Plan: General   Post-op Pain Management:    Induction:   PONV Risk Score and Plan:   Airway Management Planned:   Additional Equipment:   Intra-op Plan:   Post-operative Plan:   Informed Consent:  I have reviewed the patients History and Physical, chart, labs and discussed the procedure including the risks, benefits and alternatives for the proposed anesthesia with the patient or authorized representative who has indicated his/her understanding and acceptance.     Dental Advisory Given  Plan Discussed with: CRNA  Anesthesia Plan Comments:         Anesthesia Quick Evaluation

## 2020-04-10 NOTE — Op Note (Signed)
[facility] Gastroenterology Patient Name: Deanna Baker Procedure Date: 04/10/2020 9:27 AM MRN: [id number] Account #: [id number] Date of Birth: 1961-02-05 Admit Type: Outpatient Age: 59 Room: [facility] ENDO ROOM 4 Gender: Female Note Status: Finalized Procedure:             Upper GI endoscopy Indications:           Heartburn Providers:             Lucilla Lame MD, MD Referring MD:          Bethena Roys. Sowles, MD (Referring MD) Medicines:             Propofol per Anesthesia Complications:         No immediate complications. Procedure:             Pre-Anesthesia Assessment:                        - Prior to the procedure, a History and Physical was                         performed, and patient medications and allergies were                         reviewed. The patient's tolerance of previous                         anesthesia was also reviewed. The risks and benefits                         of the procedure and the sedation options and risks                         were discussed with the patient. All questions were                         answered, and informed consent was obtained. Prior                         Anticoagulants: The patient has taken no previous                         anticoagulant or antiplatelet agents. ASA Grade                         Assessment: II - A patient with mild systemic disease.                         After reviewing the risks and benefits, the patient                         was deemed in satisfactory condition to undergo the                         procedure.                        After obtaining informed consent, the endoscope was  passed under direct vision. Throughout the procedure,                         the patient's blood pressure, pulse, and oxygen                         saturations were monitored continuously. The Endoscope                         was introduced through the mouth, and advanced to the                         second part of duodenum. The upper GI endoscopy was                         accomplished without difficulty. The patient tolerated                         the procedure well. Findings:      The Z-line was irregular and was found at the gastroesophageal junction.      A small hiatal hernia was present.      The stomach was normal.      The examined duodenum was normal. Biopsies were taken with a cold       forceps for histology. Impression:            - Z-line irregular, at the gastroesophageal junction.                        - Small hiatal hernia.                        - Normal stomach.                        - Normal examined duodenum. Biopsied. Recommendation:        - Discharge patient to home.                        - Resume previous diet.                        - Continue present medications.                        - Await pathology results.                        - Perform a colonoscopy today. Procedure Code(s):     --- Professional ---                        [phone], Esophagogastroduodenoscopy, flexible,                         transoral; with biopsy, single or multiple Diagnosis Code(s):     --- Professional ---                        R12, Heartburn CPT copyright 2019 American Medical Association. All rights reserved. The codes documented in this report are preliminary and upon coder review may  be revised to  meet current compliance requirements. Lucilla Lame MD, MD 04/10/2020 9:35:45 AM This report has been signed electronically. Number of Addenda: 0 Note Initiated On: 04/10/2020 9:27 AM Estimated Blood Loss:  Estimated blood loss: none.      [facility]

## 2020-04-10 NOTE — Transfer of Care (Signed)
Immediate Anesthesia Transfer of Care Note  Patient: Deanna Baker  Procedure(s) Performed: COLONOSCOPY WITH PROPOFOL (N/A ) ESOPHAGOGASTRODUODENOSCOPY (EGD) WITH PROPOFOL (N/A )  Patient Location: Endoscopy Unit  Anesthesia Type:General  Level of Consciousness: sedated  Airway & Oxygen Therapy: Patient Spontanous Breathing and Patient connected to nasal cannula oxygen  Post-op Assessment: Post -op Vital signs reviewed and stable  Post vital signs: stable  Last Vitals:  Vitals Value Taken Time  BP 145/72 04/10/20 0952  Temp 36.1 C 04/10/20 0950  Pulse 85 04/10/20 0951  Resp 11 04/10/20 0951  SpO2 100 % 04/10/20 0951  Vitals shown include unvalidated device data.  Last Pain:  Vitals:   04/10/20 0950  TempSrc: Temporal  PainSc:          Complications: No apparent anesthesia complications

## 2020-04-10 NOTE — Interval H&P Note (Signed)
History and Physical Interval Note:  04/10/2020 8:53 AM  Deanna Baker  has presented today for surgery, with the diagnosis of weight loss R63.4 GERD K21.9.  The various methods of treatment have been discussed with the patient and family. After consideration of risks, benefits and other options for treatment, the patient has consented to  Procedure(s): COLONOSCOPY WITH PROPOFOL (N/A) ESOPHAGOGASTRODUODENOSCOPY (EGD) WITH PROPOFOL (N/A) as a surgical intervention.  The patient's history has been reviewed, patient examined, no change in status, stable for surgery.  I have reviewed the patient's chart and labs.  Questions were answered to the patient's satisfaction.     Aleksey Newbern [employer]

## 2020-04-10 NOTE — Op Note (Signed)
[facility] Gastroenterology Patient Name: Deanna Baker Procedure Date: 04/10/2020 9:22 AM MRN: [id number] Account #: [id number] Date of Birth: 1961-05-18 Admit Type: Outpatient Age: 59 Room: [facility] ENDO ROOM 4 Gender: Female Note Status: Finalized Procedure:             Colonoscopy Indications:           Weight loss Providers:             Lucilla Lame MD, MD Referring MD:          Bethena Roys. Sowles, MD (Referring MD) Medicines:             Propofol per Anesthesia Complications:         No immediate complications. Procedure:             Pre-Anesthesia Assessment:                        - Prior to the procedure, a History and Physical was                         performed, and patient medications and allergies were                         reviewed. The patient's tolerance of previous                         anesthesia was also reviewed. The risks and benefits                         of the procedure and the sedation options and risks                         were discussed with the patient. All questions were                         answered, and informed consent was obtained. Prior                         Anticoagulants: The patient has taken no previous                         anticoagulant or antiplatelet agents. ASA Grade                         Assessment: II - A patient with mild systemic disease.                         After reviewing the risks and benefits, the patient                         was deemed in satisfactory condition to undergo the                         procedure.                        After obtaining informed consent, the colonoscope was  passed under direct vision. Throughout the procedure,                         the patient's blood pressure, pulse, and oxygen                         saturations were monitored continuously. The                         Colonoscope was introduced through the anus and      advanced to the the cecum, identified by appendiceal                         orifice and ileocecal valve. The colonoscopy was                         performed without difficulty. The patient tolerated                         the procedure well. The quality of the bowel                         preparation was good. Findings:      The perianal and digital rectal examinations were normal.      Non-bleeding internal hemorrhoids were found during retroflexion. The       hemorrhoids were Grade I (internal hemorrhoids that do not prolapse). Impression:            - Non-bleeding internal hemorrhoids.                        - No specimens collected. Recommendation:        - Discharge patient to home.                        - Resume previous diet.                        - Continue present medications.                        - Repeat colonoscopy in 10 years for screening unless                         any change in family history or lower GI problems. Procedure Code(s):     --- Professional ---                        [phone], Colonoscopy, flexible; diagnostic, including                         collection of specimen(s) by brushing or washing, when                         performed (separate procedure) Diagnosis Code(s):     --- Professional ---                        R63.4, Abnormal weight loss CPT copyright 2019 American Medical Association. All rights reserved. The codes documented in this report are preliminary and upon  coder review may  be revised to meet current compliance requirements. Lucilla Lame MD, MD 04/10/2020 9:49:29 AM This report has been signed electronically. Number of Addenda: 0 Note Initiated On: 04/10/2020 9:22 AM Scope Withdrawal Time: 0 hours 6 minutes 44 seconds  Total Procedure Duration: 0 hours 10 minutes 22 seconds  Estimated Blood Loss:  Estimated blood loss: none.      [facility]

## 2020-04-11 ENCOUNTER — Encounter: Payer: Self-pay | Admitting: *Deleted

## 2020-04-11 ENCOUNTER — Encounter: Payer: Self-pay | Admitting: Gastroenterology

## 2020-04-11 LAB — SURGICAL PATHOLOGY

## 2020-04-16 NOTE — Anesthesia Postprocedure Evaluation (Signed)
Anesthesia Post Note  Patient: JENNAVIEVE MANCINE  Procedure(s) Performed: COLONOSCOPY WITH PROPOFOL (N/A ) ESOPHAGOGASTRODUODENOSCOPY (EGD) WITH PROPOFOL (N/A )  Patient location during evaluation: PACU Anesthesia Type: General Level of consciousness: awake and alert Pain management: pain level controlled Vital Signs Assessment: post-procedure vital signs reviewed and stable Respiratory status: spontaneous breathing, nonlabored ventilation, respiratory function stable and patient connected to nasal cannula oxygen Cardiovascular status: blood pressure returned to baseline and stable Postop Assessment: no apparent nausea or vomiting Anesthetic complications: no     Last Vitals:  Vitals:   04/10/20 1010 04/10/20 1020  BP: (!) 165/97 (!) 179/98  Pulse: 100 96  Resp: 19 12  Temp:    SpO2: 100% 100%    Last Pain:  Vitals:   04/10/20 0950  TempSrc: Temporal  PainSc:                  Molli Barrows

## 2020-05-07 ENCOUNTER — Other Ambulatory Visit: Payer: Self-pay

## 2020-05-07 ENCOUNTER — Ambulatory Visit (INDEPENDENT_AMBULATORY_CARE_PROVIDER_SITE_OTHER): Payer: 59

## 2020-05-07 DIAGNOSIS — E538 Deficiency of other specified B group vitamins: Secondary | ICD-10-CM | POA: Diagnosis not present

## 2020-06-04 ENCOUNTER — Other Ambulatory Visit: Payer: Self-pay

## 2020-06-04 ENCOUNTER — Ambulatory Visit (INDEPENDENT_AMBULATORY_CARE_PROVIDER_SITE_OTHER): Payer: 59

## 2020-06-04 DIAGNOSIS — E538 Deficiency of other specified B group vitamins: Secondary | ICD-10-CM | POA: Diagnosis not present

## 2020-06-04 NOTE — Progress Notes (Signed)
Patient came in for her B-12 injection. She tolerated it well. NKDA.   

## 2020-07-02 ENCOUNTER — Ambulatory Visit (INDEPENDENT_AMBULATORY_CARE_PROVIDER_SITE_OTHER): Payer: 59

## 2020-07-02 ENCOUNTER — Other Ambulatory Visit: Payer: Self-pay

## 2020-07-02 DIAGNOSIS — E538 Deficiency of other specified B group vitamins: Secondary | ICD-10-CM

## 2020-07-16 ENCOUNTER — Telehealth: Payer: Self-pay | Admitting: Family Medicine

## 2020-07-16 ENCOUNTER — Other Ambulatory Visit: Payer: Self-pay | Admitting: Family Medicine

## 2020-07-16 DIAGNOSIS — E538 Deficiency of other specified B group vitamins: Secondary | ICD-10-CM

## 2020-07-16 NOTE — Telephone Encounter (Signed)
Dr Allen Norris is asking for the patient to stop her b12 injections for 3 mnths and to have labs done to what her level is. The medication that he was giving her ( dexiliant for acid reflux) was depleting her B12 and now he  wants to see where her levels are with the b12 for 3 months. Patient will need orders for lab

## 2020-07-31 ENCOUNTER — Ambulatory Visit: Payer: 59

## 2020-09-27 ENCOUNTER — Encounter (INDEPENDENT_AMBULATORY_CARE_PROVIDER_SITE_OTHER): Payer: Self-pay

## 2020-09-27 ENCOUNTER — Telehealth: Payer: Self-pay | Admitting: Hematology and Oncology

## 2020-09-27 NOTE — Telephone Encounter (Signed)
Patient called and cancelled her f/u appt with Dr C. She said she wodl call back and reschedule.

## 2020-10-02 ENCOUNTER — Ambulatory Visit: Payer: BLUE CROSS/BLUE SHIELD | Admitting: Hematology and Oncology

## 2020-10-02 ENCOUNTER — Other Ambulatory Visit: Payer: BLUE CROSS/BLUE SHIELD

## 2020-10-15 ENCOUNTER — Telehealth: Payer: Self-pay

## 2020-10-15 NOTE — Telephone Encounter (Signed)
Copied from [place] [phone]. Topic: General - Other >> Oct 15, 2020 10:27 AM Deanna Baker wrote: Reason for CRM: Pt called stating that she her oncologist was supposed to send over lab orders for the pt to have done at her lab appt with PCP. Please advise.

## 2020-10-16 ENCOUNTER — Other Ambulatory Visit: Payer: Self-pay

## 2020-10-16 ENCOUNTER — Encounter: Payer: Self-pay | Admitting: Family Medicine

## 2020-10-17 ENCOUNTER — Other Ambulatory Visit: Payer: Self-pay | Admitting: Family Medicine

## 2020-10-17 DIAGNOSIS — C50412 Malignant neoplasm of upper-outer quadrant of left female breast: Secondary | ICD-10-CM

## 2020-10-17 DIAGNOSIS — E785 Hyperlipidemia, unspecified: Secondary | ICD-10-CM

## 2020-10-17 DIAGNOSIS — E538 Deficiency of other specified B group vitamins: Secondary | ICD-10-CM

## 2020-10-17 DIAGNOSIS — I10 Essential (primary) hypertension: Secondary | ICD-10-CM

## 2020-10-17 DIAGNOSIS — R739 Hyperglycemia, unspecified: Secondary | ICD-10-CM

## 2020-10-17 DIAGNOSIS — Z171 Estrogen receptor negative status [ER-]: Secondary | ICD-10-CM

## 2020-10-23 LAB — CBC WITH DIFFERENTIAL/PLATELET
Absolute Monocytes: 273 cells/uL (ref 200–950)
Basophils Absolute: 17 cells/uL (ref 0–200)
Basophils Relative: 0.3 %
Eosinophils Absolute: 93 cells/uL (ref 15–500)
Eosinophils Relative: 1.6 %
HCT: 39.8 % (ref 35.0–45.0)
Hemoglobin: 13.4 g/dL (ref 11.7–15.5)
Lymphs Abs: 1386 cells/uL (ref 850–3900)
MCH: 30 pg (ref 27.0–33.0)
MCHC: 33.7 g/dL (ref 32.0–36.0)
MCV: 89 fL (ref 80.0–100.0)
MPV: 11.1 fL (ref 7.5–12.5)
Monocytes Relative: 4.7 %
Neutro Abs: 4031 cells/uL (ref 1500–7800)
Neutrophils Relative %: 69.5 %
Platelets: 230 10*3/uL (ref 140–400)
RBC: 4.47 10*6/uL (ref 3.80–5.10)
RDW: 12.6 % (ref 11.0–15.0)
Total Lymphocyte: 23.9 %
WBC: 5.8 10*3/uL (ref 3.8–10.8)

## 2020-10-23 LAB — LIPID PANEL
Cholesterol: 248 mg/dL — ABNORMAL HIGH (ref <200)
HDL: 59 mg/dL (ref >=50)
LDL Cholesterol (Calc): 165 mg/dL (calc) — ABNORMAL HIGH
Non-HDL Cholesterol (Calc): 189 mg/dL (calc) — ABNORMAL HIGH (ref <130)
Total CHOL/HDL Ratio: 4.2 (calc) (ref <5.0)
Triglycerides: 118 mg/dL (ref <150)

## 2020-10-23 LAB — CANCER ANTIGEN 27.29: CA 27.29: 18 U/mL (ref <38)

## 2020-10-23 LAB — COMPLETE METABOLIC PANEL WITH GFR
AG Ratio: 1.9 (calc) (ref 1.0–2.5)
ALT: 11 U/L (ref 6–29)
AST: 16 U/L (ref 10–35)
Albumin: 4.3 g/dL (ref 3.6–5.1)
Alkaline phosphatase (APISO): 111 U/L (ref 37–153)
BUN: 14 mg/dL (ref 7–25)
CO2: 28 mmol/L (ref 20–32)
Calcium: 9.6 mg/dL (ref 8.6–10.4)
Chloride: 106 mmol/L (ref 98–110)
Creat: 0.88 mg/dL (ref 0.50–0.99)
GFR, Est African American: 83 mL/min/{1.73_m2} (ref >=60)
GFR, Est Non African American: 71 mL/min/{1.73_m2} (ref >=60)
Globulin: 2.3 g/dL (calc) (ref 1.9–3.7)
Glucose, Bld: 107 mg/dL — ABNORMAL HIGH (ref 65–99)
Potassium: 4.1 mmol/L (ref 3.5–5.3)
Sodium: 141 mmol/L (ref 135–146)
Total Bilirubin: 0.6 mg/dL (ref 0.2–1.2)
Total Protein: 6.6 g/dL (ref 6.1–8.1)

## 2020-10-23 LAB — HEMOGLOBIN A1C
Hgb A1c MFr Bld: 5.4 % of total Hgb (ref <5.7)
Mean Plasma Glucose: 108 (calc)
eAG (mmol/L): 6 (calc)

## 2020-10-23 LAB — VITAMIN B12: Vitamin B-12: >2000 pg/mL — ABNORMAL HIGH (ref 200–1100)

## 2020-10-29 NOTE — Progress Notes (Signed)
Name: Deanna Baker   MRN: [id number]    DOB: [birth year]   Date:10/30/2020       Progress Note  Subjective  Chief Complaint  Chief Complaint  Patient presents with  . Annual Exam    HPI  Patient presents for annual CPE and follow up   GERD: she had daily symptoms, she stopped PPI on her own, but states does not want to resume medication. Recently seen by Dr. Allen Norris.   HTN: bp is elevated today, she has not been taking Norvasc or Chlorthalidone, only taking medications prn. Discussed switching to HCTZ 12.5 mg to take half to one daily but to take daily. No chest pain or palpitation   Seasonal Affective disorder: she states so far she has been doing well. Denies crying spells, she still deals with pain daily but coping better  Weight loss: she  lost 18 lbs over the past 12 months, and of those 10 lbs was in the past 3 months. She has not changed her diet or exercise level, but she stopped gabapentin Fall of 2020 . She had multiple labs done 09/2019. She is up to date with mammogram and colonoscopy. Weight is now stable . She states weight loss secondary to stopping medications and healthier diet  FMS: she stopped Gabapentin on her own, she has been taking ibuprofen prn . She states she has pain all over pain at this time is 3-5/10, but still has constant fatigue and mental fogginess. She has chronic fatigue syndrome, but also stable   Vasomotor rhinitis she went back to Dr. Tami Ribas for recurrent clear rhinorrhea and vomiting when showering, had a CT sinus that per patient was negative ( unable to see it) she was given nasal spray but not using it. She also gags when brushing her teeth. Discussed trying baths instead of showers since that also bothers her but she has difficulty getting in the bathtub  Dyslipidemia: low risk of heart attack and strokes in the next 10 years and not interested in statin therapy   The 10-year ASCVD risk score Mikey Bussing DC Brooke Bonito., et al., 2013) is: 6.1%   Values  used to calculate the score:     Age: 59 years     Sex: Female     Is Non-Hispanic African American: No     Diabetic: No     Tobacco smoker: No     Systolic Blood Pressure: 191 mmHg     Is BP treated: Yes     HDL Cholesterol: 59 mg/dL     Total Cholesterol: 248 mg/dL   Diet: balanced Exercise: continue 150 minutes per week     Office Visit from 10/12/2019 in [facility]  AUDIT-C Score 1     Depression: Phq 9 is  negative Depression screen [facility] 2/9 10/30/2020 01/13/2020 10/12/2019 07/06/2019 03/31/2019  Decreased Interest 0 0 0 0 0  Down, Depressed, Hopeless 0 1 0 0 0  PHQ - 2 Score 0 1 0 0 0  Altered sleeping 0 0 0 0 1  Tired, decreased energy $RemoveBeforeDE'2 2 2 'EovLuQsUIDoKkOv$ 0 1  Change in appetite 0 2 0 0 0  Feeling bad or failure about yourself  1 1 0 0 0  Trouble concentrating 1 0 1 0 0  Moving slowly or fidgety/restless 1 0 0 0 1  Suicidal thoughts 0 0 0 0 0  PHQ-9 Score $RemoveBef'5 6 3 'PMmazlPcLb$ 0 3  Difficult doing work/chores - Somewhat difficult - Not difficult at all Not difficult  at all  Some recent data might be hidden   Hypertension: BP Readings from Last 3 Encounters:  10/30/20 (!) 142/84  04/10/20 (!) 179/98  03/20/20 (!) 153/87   Obesity: Wt Readings from Last 3 Encounters:  10/30/20 164 lb 3.2 oz (74.5 kg)  04/10/20 163 lb (73.9 kg)  03/20/20 163 lb 3.2 oz (74 kg)   BMI Readings from Last 3 Encounters:  10/30/20 32.07 kg/m  04/10/20 31.83 kg/m  03/20/20 31.87 kg/m     Vaccines:   Shingrix: 75-64 yo and ask insurance if covered when patient above 31 yo Pneumonia:  educated and discussed with patient. Flu:  educated and discussed with patient.  Hep C Screening: 10/16/14 STD testing and prevention (HIV/chl/gon/syphilis): not interested  Intimate partner violence: negative screen  Sexual History :no pain  Menstrual History/LMP/Abnormal Bleeding: discussed post menopausal bleeding  Incontinence Symptoms: very mild stress incontinence  Breast cancer:  - Last  Mammogram: 12/26/19 - BRCA gene screening: 02/12/15  Osteoporosis: Discussed high calcium and vitamin D supplementation, weight bearing exercises  Cervical cancer screening: repeat next year   Skin cancer: Discussed monitoring for atypical lesions  Colorectal cancer: 04/10/20   Lung cancer:  Low Dose CT Chest recommended if Age 34-80 years, 10 pack-year currently smoking OR have quit w/in 15years. Patient does not qualify.   ECG: 2019  Advanced Care Planning: A voluntary discussion about advance care planning including the explanation and discussion of advance directives husband.  Discussed health care proxy and Living will, and the patient was able to identify a health care proxy as husband.    Lipids: Lab Results  Component Value Date   CHOL 248 (H) 10/22/2020   CHOL 252 (H) 09/26/2019   CHOL 247 (H) 09/27/2018   Lab Results  Component Value Date   HDL 59 10/22/2020   HDL 56 09/26/2019   HDL 56 09/27/2018   Lab Results  Component Value Date   LDLCALC 165 (H) 10/22/2020   LDLCALC 164 (H) 09/26/2019   LDLCALC 160 (H) 09/27/2018   Lab Results  Component Value Date   TRIG 118 10/22/2020   TRIG 167 (H) 09/26/2019   TRIG 163 (H) 09/27/2018   Lab Results  Component Value Date   CHOLHDL 4.2 10/22/2020   CHOLHDL 4.5 09/26/2019   CHOLHDL 4.4 09/27/2018   No results found for: LDLDIRECT  Glucose: Glucose  Date Value Ref Range Status  02/20/2015 113 (H) mg/dL Final    Comment:    65-99 NOTE: New Reference Range  01/30/15   08/22/2014 94 65 - 99 mg/dL Final  02/14/2014 117 (H) 65 - 99 mg/dL Final   Glucose, Bld  Date Value Ref Range Status  10/22/2020 107 (H) 65 - 99 mg/dL Final    Comment:    .            Fasting reference interval . For someone without known diabetes, a glucose value between 100 and 125 mg/dL is consistent with prediabetes and should be confirmed with a follow-up test. .   09/26/2019 113 (H) 65 - 99 mg/dL Final    Comment:    .             Fasting reference interval . For someone without known diabetes, a glucose value between 100 and 125 mg/dL is consistent with prediabetes and should be confirmed with a follow-up test. .   09/27/2018 116 (H) 65 - 99 mg/dL Final    Comment:    .  Fasting reference interval . For someone without known diabetes, a glucose value between 100 and 125 mg/dL is consistent with prediabetes and should be confirmed with a follow-up test. .    Glucose-Capillary  Date Value Ref Range Status  08/16/2015 124 (H) 65 - 99 mg/dL Final    Patient Active Problem List   Diagnosis Date Noted  . Loss of weight   . Gastroesophageal reflux disease   . Anemia 04/04/2018  . B12 deficiency 03/29/2018  . Cognitive decline 01/02/2017  . Chronic kidney disease, stage 3 ([place]) 09/23/2016  . Sleep apnea 07/11/2016  . ASCUS favor benign 07/10/2016  . Memory loss 05/12/2016  . Smell or taste sensation disturbance 05/12/2016  . Personal history of colonic polyps   . Benign neoplasm of transverse colon   . Rectal polyp   . Tenosynovitis of thumb 03/12/2016  . Bilateral knee pain 03/12/2016  . Reflux esophagitis   . Stricture and stenosis of esophagus   . Abnormal findings-gastrointestinal tract   . Thickening of esophagus 08/03/2015  . Choking 07/04/2015  . Benign essential HTN 05/01/2015  . CFIDS (chronic fatigue and immune dysfunction syndrome) ([place]) 05/01/2015  . Major depression, recurrent, chronic ([place]) 05/01/2015  . Dyslipidemia 05/01/2015  . Fibromyalgia syndrome 05/01/2015  . Blood glucose elevated 05/01/2015  . Eczema intertrigo 05/01/2015  . Iron deficiency anemia due to chronic blood loss 05/01/2015  . Migraine without aura and without status migrainosus, not intractable 05/01/2015  . Excessive urination at night 05/01/2015  . Dysmetabolic syndrome 12/45/8099  . History of colonoscopy with polypectomy 05/01/2015  . Central sleep apnea 05/01/2015  . History of back surgery  05/01/2015  . Thickened nails 05/01/2015    Past Surgical History:  Procedure Laterality Date  . BACK SURGERY    . BREAST BIOPSY Left 10/2012   +  . 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 BREAST LUMPECTOMY Left 12/06/12    lumpectomy with SN biopsy and power port placement...  . COLONOSCOPY  2013   Dr. Suzette Battiest   . COLONOSCOPY WITH PROPOFOL N/A 04/28/2016   Procedure: COLONOSCOPY WITH PROPOFOL;  Surgeon: Lucilla Lame, MD;  Location: [place];  Service: Endoscopy;  Laterality: N/A;  CPAP  . COLONOSCOPY WITH PROPOFOL N/A 04/10/2020   Procedure: COLONOSCOPY WITH PROPOFOL;  Surgeon: Lucilla Lame, MD;  Location[facility] ENDOSCOPY;  Service: Endoscopy;  Laterality: N/A;  . ESOPHAGOGASTRODUODENOSCOPY (EGD) WITH PROPOFOL N/A 10/23/2015   Procedure: ESOPHAGOGASTRODUODENOSCOPY (EGD) WITH PROPOFOL;  Surgeon: Lucilla Lame, MD;  Location: [facility] ENDOSCOPY;  Service: Endoscopy;  Laterality: N/A;  . ESOPHAGOGASTRODUODENOSCOPY (EGD) WITH PROPOFOL N/A 04/10/2020   Procedure: ESOPHAGOGASTRODUODENOSCOPY (EGD) WITH PROPOFOL;  Surgeon: Lucilla Lame, MD;  Location: [facility] ENDOSCOPY;  Service: Endoscopy;  Laterality: N/A;  . POLYPECTOMY  04/28/2016   Procedure: POLYPECTOMY;  Surgeon: Lucilla Lame, MD;  Location: [place];  Service: Endoscopy;;  . SHOULDER SURGERY Left 2009  . TONSILLECTOMY  1965  . TUBAL LIGATION  1982  . UPPER GI ENDOSCOPY     Dr. Suzette Battiest    Family History  Problem Relation Age of Onset  . Breast cancer Mother 34  . Diabetes Mother   . Gout Mother   . Kidney failure Mother   . Hypertension Mother   . Congestive Heart Failure Mother   . Stomach cancer Maternal Aunt   . Brain cancer Paternal Uncle        x 2 mat uncles  . Heart attack Maternal Grandmother   .  Hypertension Maternal Grandmother   . Stroke Maternal Grandmother   . Epilepsy Maternal Aunt     Social History   Socioeconomic History  . Marital status: Married    Spouse name:  Christia Reading  . Number of children: 1  . Years of education: Not on file  . Highest education level: GED or equivalent  Occupational History  . Occupation: Unemployed  Tobacco Use  . Smoking status: Former Smoker    Packs/day: 0.50    Years: 4.00    Pack years: 2.00    Types: Cigarettes    Start date: 11/24/1976    Quit date: 11/24/1980    Years since quitting: 39.9  . Smokeless tobacco: Never Used  Vaping Use  . Vaping Use: Never used  Substance and Sexual Activity  . Alcohol use: Yes    Alcohol/week: 0.0 standard drinks    Comment: rarely, 1-2x/mo glass of wine  . Drug use: No  . Sexual activity: Yes    Partners: Male    Birth control/protection: Post-menopausal  Other Topics Concern  . Not on file  Social History Narrative  . Not on file   Social Determinants of Health   Financial Resource Strain: Low Risk   . Difficulty of Paying Living Expenses: Not hard at all  Food Insecurity: No Food Insecurity  . Worried About Charity fundraiser in the Last Year: Never true  . Ran Out of Food in the Last Year: Never true  Transportation Needs: No Transportation Needs  . Lack of Transportation (Medical): No  . Lack of Transportation (Non-Medical): No  Physical Activity: Sufficiently Active  . Days of Exercise per Week: 5 days  . Minutes of Exercise per Session: 30 min  Stress: No Stress Concern Present  . Feeling of Stress : Only a little  Social Connections: Socially Integrated  . Frequency of Communication with Friends and Family: More than three times a week  . Frequency of Social Gatherings with Friends and Family: Once a week  . Attends Religious Services: More than 4 times per year  . Active Member of Clubs or Organizations: Yes  . Attends Archivist Meetings: More than 4 times per year  . Marital Status: Married  Human resources officer Violence: Not At Risk  . Fear of Current or Ex-Partner: No  . Emotionally Abused: No  . Physically Abused: No  . Sexually Abused: No      Current Outpatient Medications:  .  amLODipine (NORVASC) 10 MG tablet, Take 1 tablet (10 mg total) by mouth daily. At night, Disp: 90 tablet, Rfl: 0 .  celecoxib (CELEBREX) 200 MG capsule, TAKE 1 CAPSULE BY MOUTH EVERY DAY, Disp: 30 capsule, Rfl: 0 .  chlorthalidone (HYGROTON) 25 MG tablet, Take 0.5 tablets (12.5 mg total) by mouth daily. In am (Patient taking differently: Take 12.5 mg by mouth as needed. In am), Disp: 45 tablet, Rfl: 0 .  ibuprofen (ADVIL) 400 MG tablet, Take 400 mg by mouth every 6 (six) hours as needed., Disp: , Rfl:  .  Nerve Stimulator (STANDARD TENS) DEVI, 1 Units by Does not apply route daily., Disp: 1 Device, Rfl: 0 .  Na Sulfate-K Sulfate-Mg Sulf (SUPREP BOWEL PREP KIT) 17.5-3.13-1.6 GM/177ML SOLN, Take 1 kit by mouth as directed. (Patient not taking: Reported on 10/30/2020), Disp: 354 mL, Rfl: 0  Current Facility-Administered Medications:  .  cyanocobalamin ((VITAMIN B-12)) injection 1,000 mcg, 1,000 mcg, Intramuscular, Q30 days, Steele Sizer, MD, 1,000 mcg at 07/02/20 0847  Allergies  Allergen  Reactions  . Ace Inhibitors Swelling  . Benicar [Olmesartan] Other (See Comments)    Chest Pressure and Allergic Reaction  . Pregabalin     weight gain and blurred vision  . Venlafaxine     foggy minded     ROS  Constitutional: Negative for fever or weight change.  Respiratory: Negative for cough and shortness of breath.   Cardiovascular: Negative for chest pain or palpitations.  Gastrointestinal: Negative for abdominal pain, no bowel changes.  Musculoskeletal: Negative for gait problem or joint swelling.  Skin: Negative for rash.  Neurological: Negative for dizziness or headache.  No other specific complaints in a complete review of systems (except as listed in HPI above).  Objective  Vitals:   10/30/20 1054  BP: (!) 142/84  Pulse: 84  Resp: 16  Temp: 97.9 F (36.6 C)  TempSrc: Oral  SpO2: 100%  Weight: 164 lb 3.2 oz (74.5 kg)  Height: 5'  (1.524 m)    Body mass index is 32.07 kg/m.  Physical Exam  Constitutional: Patient appears well-developed and well-nourished, obese No distress.  HENT: Head: Normocephalic and atraumatic. Ears: B TMs ok, no erythema or effusion; Nose: not done Mouth/Throat: not done  Eyes: Conjunctivae and EOM are normal. Pupils are equal, round, and reactive to light. No scleral icterus.  Neck: Normal range of motion. Neck supple. No JVD present. No thyromegaly present.  Cardiovascular: Normal rate, regular rhythm and normal heart sounds.  No murmur heard. No BLE edema. Pulmonary/Chest: Effort normal and breath sounds normal. No respiratory distress. Abdominal: Soft. Bowel sounds are normal, no distension. There is no tenderness. no masses Breast: no lumps or masses, no nipple discharge or rashes FEMALE GENITALIA:  Not done RECTAL: not done Musculoskeletal: Normal range of motion, no joint effusions. No gross deformities Trigger point positive  Neurological: he is alert and oriented to person, place, and time. No cranial nerve deficit. Coordination, balance, strength, speech and gait are normal.  Skin: Skin is warm and dry. No rash noted. No erythema.  Psychiatric: Patient has a normal mood and affect. behavior is normal. Judgment and thought content normal.  Recent Results (from the past 2160 hour(s))  CBC with Differential/Platelet     Status: None   Collection Time: 10/22/20  9:51 AM  Result Value Ref Range   WBC 5.8 3.8 - 10.8 Thousand/uL   RBC 4.47 3.80 - 5.10 Million/uL   Hemoglobin 13.4 11.7 - 15.5 g/dL   HCT 39.8 35 - 45 %   MCV 89.0 80.0 - 100.0 fL   MCH 30.0 27.0 - 33.0 pg   MCHC 33.7 32.0 - 36.0 g/dL   RDW 12.6 11.0 - 15.0 %   Platelets 230 140 - 400 Thousand/uL   MPV 11.1 7.5 - 12.5 fL   Neutro Abs 4,031 1,500 - 7,800 cells/uL   Lymphs Abs 1,386 850 - 3,900 cells/uL   Absolute Monocytes 273 200 - 950 cells/uL   Eosinophils Absolute 93 15.0 - 500.0 cells/uL   Basophils Absolute  17 0.0 - 200.0 cells/uL   Neutrophils Relative % 69.5 %   Total Lymphocyte 23.9 %   Monocytes Relative 4.7 %   Eosinophils Relative 1.6 %   Basophils Relative 0.3 %  COMPLETE METABOLIC PANEL WITH GFR     Status: Abnormal   Collection Time: 10/22/20  9:51 AM  Result Value Ref Range   Glucose, Bld 107 (H) 65 - 99 mg/dL    Comment: .  Fasting reference interval . For someone without known diabetes, a glucose value between 100 and 125 mg/dL is consistent with prediabetes and should be confirmed with a follow-up test. .    BUN 14 7 - 25 mg/dL   Creat 0.88 0.50 - 0.99 mg/dL    Comment: For patients >42 years of age, the reference limit for Creatinine is approximately 13% higher for people identified as African-American. .    GFR, Est Non African American 71 > OR = 60 mL/min/1.32m2   GFR, Est African American 83 > OR = 60 mL/min/1.62m2   BUN/Creatinine Ratio NOT APPLICABLE 6 - 22 (calc)   Sodium 141 135 - 146 mmol/L   Potassium 4.1 3.5 - 5.3 mmol/L   Chloride 106 98 - 110 mmol/L   CO2 28 20 - 32 mmol/L   Calcium 9.6 8.6 - 10.4 mg/dL   Total Protein 6.6 6.1 - 8.1 g/dL   Albumin 4.3 3.6 - 5.1 g/dL   Globulin 2.3 1.9 - 3.7 g/dL (calc)   AG Ratio 1.9 1.0 - 2.5 (calc)   Total Bilirubin 0.6 0.2 - 1.2 mg/dL   Alkaline phosphatase (APISO) 111 37 - 153 U/L   AST 16 10 - 35 U/L   ALT 11 6 - 29 U/L  Lipid panel     Status: Abnormal   Collection Time: 10/22/20  9:51 AM  Result Value Ref Range   Cholesterol 248 (H) <200 mg/dL   HDL 59 > OR = 50 mg/dL   Triglycerides 118 <150 mg/dL   LDL Cholesterol (Calc) 165 (H) mg/dL (calc)    Comment: Reference range: <100 . Desirable range <100 mg/dL for primary prevention;   <70 mg/dL for patients with CHD or diabetic patients  with > or = 2 CHD risk factors. Marland Kitchen LDL-C is now calculated using the Martin-Hopkins  calculation, which is a validated novel method providing  better accuracy than the Friedewald equation in the  estimation  of LDL-C.  Cresenciano Genre et al. Annamaria Helling. 1610;960(45): 2061-2068  (http://education.[facility].com/faq/FAQ164)    Total CHOL/HDL Ratio 4.2 <5.0 (calc)   Non-HDL Cholesterol (Calc) 189 (H) <130 mg/dL (calc)    Comment: For patients with diabetes plus 1 major ASCVD risk  factor, treating to a non-HDL-C goal of <100 mg/dL  (LDL-C of <70 mg/dL) is considered a therapeutic  option.   Vitamin B12     Status: Abnormal   Collection Time: 10/22/20  9:51 AM  Result Value Ref Range   Vitamin B-12 >2,000 (H) 200 - 1,100 pg/mL  Cancer antigen 27.29     Status: None   Collection Time: 10/22/20  9:51 AM  Result Value Ref Range   CA 27.29 18 <38 U/mL    Comment: . This test was performed using the Siemens Chemiluminescent method. Values obtained from different assay methods cannot be used interchangeably. CA 27.29 levels, regardless of value, should not be interpreted as absolute evidence of the presence or absence of disease. Marland Kitchen   Hemoglobin A1c     Status: None   Collection Time: 10/22/20  9:51 AM  Result Value Ref Range   Hgb A1c MFr Bld 5.4 <5.7 % of total Hgb    Comment: For the purpose of screening for the presence of diabetes: . <5.7%       Consistent with the absence of diabetes 5.7-6.4%    Consistent with increased risk for diabetes             (prediabetes) > or =6.5%  Consistent with  diabetes . This assay result is consistent with a decreased risk of diabetes. . Currently, no consensus exists regarding use of hemoglobin A1c for diagnosis of diabetes in children. . According to American Diabetes Association (ADA) guidelines, hemoglobin A1c <7.0% represents optimal control in non-pregnant diabetic patients. Different metrics may apply to specific patient populations.  Standards of Medical Care in Diabetes(ADA). .    Mean Plasma Glucose 108 (calc)   eAG (mmol/L) 6.0 (calc)      Fall Risk: Fall Risk  10/30/2020 01/13/2020 10/12/2019 07/06/2019 03/31/2019  Falls in the  past year? 0 0 1 0 0  Comment - - - - -  Number falls in past yr: 0 0 0 0 0  Comment - - Walking her dog after it rained - -  Injury with Fall? 0 0 1 0 0  Comment - - Contusion on her left knee - -  Risk for fall due to : - - - - -  Risk for fall due to: Comment - - - - -  Follow up - - - Falls evaluation completed -     Functional Status Survey: Is the patient deaf or have difficulty hearing?: Yes Does the patient have difficulty seeing, even when wearing glasses/contacts?: No Does the patient have difficulty concentrating, remembering, or making decisions?: Yes Does the patient have difficulty walking or climbing stairs?: No Does the patient have difficulty dressing or bathing?: No Does the patient have difficulty doing errands alone such as visiting a doctor's office or shopping?: No   Assessment & Plan  1. Benign essential HTN  - hydrochlorothiazide (HYDRODIURIL) 12.5 MG tablet; Take 0.5-1 tablets (6.25-12.5 mg total) by mouth daily.  Dispense: 90 tablet; Refill: 1  2. Dyslipidemia   3. Seasonal affective disorder ([place])  Monitor for now, does not want medication  4. Hyperglycemia   5. Fibromyalgia syndrome   6. Gastroesophageal reflux disease without esophagitis   7. Obstructive sleep apnea syndrome   8. Migraine without aura and without status migrainosus, not intractable  stable  9. Needs flu shot  refused  10. Need for shingles vaccine  refused  11. Well adult exam   12. CFIDS (chronic fatigue and immune dysfunction syndrome) ([facility])   13. Encounter for screening mammogram for malignant neoplasm of breast  - MM 3D SCREEN BREAST BILATERAL; Future  -USPSTF grade A and B recommendations reviewed with patient; age-appropriate recommendations, preventive care, screening tests, etc discussed and encouraged; healthy living encouraged; see AVS for patient education given to patient -Discussed importance of 150 minutes of physical activity weekly, eat  two servings of fish weekly, eat one serving of tree nuts ( cashews, pistachios, pecans, almonds.Marland Kitchen) every other day, eat 6 servings of fruit/vegetables daily and drink plenty of water and avoid sweet beverages.

## 2020-10-30 ENCOUNTER — Ambulatory Visit (INDEPENDENT_AMBULATORY_CARE_PROVIDER_SITE_OTHER): Payer: 59 | Admitting: Family Medicine

## 2020-10-30 ENCOUNTER — Other Ambulatory Visit: Payer: Self-pay

## 2020-10-30 ENCOUNTER — Encounter: Payer: Self-pay | Admitting: Family Medicine

## 2020-10-30 VITALS — BP 142/84 | HR 84 | Temp 97.9°F | Resp 16 | Ht 60.0 in | Wt 164.2 lb

## 2020-10-30 DIAGNOSIS — I10 Essential (primary) hypertension: Secondary | ICD-10-CM | POA: Diagnosis not present

## 2020-10-30 DIAGNOSIS — K219 Gastro-esophageal reflux disease without esophagitis: Secondary | ICD-10-CM

## 2020-10-30 DIAGNOSIS — G4733 Obstructive sleep apnea (adult) (pediatric): Secondary | ICD-10-CM

## 2020-10-30 DIAGNOSIS — G43009 Migraine without aura, not intractable, without status migrainosus: Secondary | ICD-10-CM

## 2020-10-30 DIAGNOSIS — Z23 Encounter for immunization: Secondary | ICD-10-CM

## 2020-10-30 DIAGNOSIS — F338 Other recurrent depressive disorders: Secondary | ICD-10-CM

## 2020-10-30 DIAGNOSIS — Z0000 Encounter for general adult medical examination without abnormal findings: Secondary | ICD-10-CM

## 2020-10-30 DIAGNOSIS — R739 Hyperglycemia, unspecified: Secondary | ICD-10-CM

## 2020-10-30 DIAGNOSIS — M797 Fibromyalgia: Secondary | ICD-10-CM

## 2020-10-30 DIAGNOSIS — G9332 Myalgic encephalomyelitis/chronic fatigue syndrome: Secondary | ICD-10-CM

## 2020-10-30 DIAGNOSIS — E785 Hyperlipidemia, unspecified: Secondary | ICD-10-CM

## 2020-10-30 DIAGNOSIS — D8989 Other specified disorders involving the immune mechanism, not elsewhere classified: Secondary | ICD-10-CM

## 2020-10-30 DIAGNOSIS — R5382 Chronic fatigue, unspecified: Secondary | ICD-10-CM

## 2020-10-30 DIAGNOSIS — Z1231 Encounter for screening mammogram for malignant neoplasm of breast: Secondary | ICD-10-CM

## 2020-10-30 MED ORDER — HYDROCHLOROTHIAZIDE 12.5 MG PO TABS: 6.2500 mg | ORAL_TABLET | Freq: Every day | ORAL | 1 refills | Status: DC

## 2020-10-30 NOTE — Patient Instructions (Signed)

## 2020-12-28 ENCOUNTER — Ambulatory Visit
Admission: RE | Admit: 2020-12-28 | Discharge: 2020-12-28 | Disposition: A | Discharge status: Home / Self Care | Payer: 59 | Source: Ambulatory Visit | Attending: Family Medicine | Admitting: Family Medicine

## 2020-12-28 ENCOUNTER — Other Ambulatory Visit: Payer: Self-pay

## 2020-12-28 DIAGNOSIS — Z1231 Encounter for screening mammogram for malignant neoplasm of breast: Secondary | ICD-10-CM | POA: Diagnosis not present

## 2021-04-30 ENCOUNTER — Ambulatory Visit: Payer: 59 | Admitting: Family Medicine

## 2021-05-05 ENCOUNTER — Other Ambulatory Visit: Payer: Self-pay | Admitting: Family Medicine

## 2021-05-05 DIAGNOSIS — I10 Essential (primary) hypertension: Secondary | ICD-10-CM

## 2021-05-05 NOTE — Telephone Encounter (Signed)
Requested Prescriptions  Pending Prescriptions Disp Refills  . hydrochlorothiazide (HYDRODIURIL) 12.5 MG tablet [Pharmacy Med Name: HYDROCHLOROTHIAZIDE 12.5 MG TB] 90 tablet 0    Sig: TAKE 0.5-1 TABLETS (6.25-12.5 MG TOTAL) BY MOUTH DAILY.     Cardiovascular: Diuretics - Thiazide Failed - 05/05/2021 12:50 AM      Failed - Last BP in normal range    BP Readings from Last 1 Encounters:  10/30/20 (!) 142/84         Failed - Valid encounter within last 6 months    Recent Outpatient Visits          6 months ago Benign essential HTN   [facility] Steele Sizer, MD   1 year ago Seasonal affective disorder [facility])   [facility] Steele Sizer, MD   1 year ago Fibromyalgia syndrome   [facility] Steele Sizer, MD   1 year ago Major depression in remission [facility])   [facility] Steele Sizer, MD   2 years ago Major depression in remission [facility])   [facility] Steele Sizer, MD      Future Appointments            In 1 week Steele Sizer, MD [facility] [place]   In 5 months Steele Sizer, MD [facility] [place] in normal range and within 360 days    Calcium  Date Value Ref Range Status  10/22/2020 9.6 8.6 - 10.4 mg/dL Final   Calcium, Total  Date Value Ref Range Status  02/20/2015 8.7 (L) mg/dL Final    Comment:    8.9-10.3 NOTE: New Reference Range  01/30/15    Calcium, Ion  Date Value Ref Range Status  09/20/2015 5.1 4.5 - 5.6 mg/dL Final         Passed - Cr in normal range and within 360 days    Creat  Date Value Ref Range Status  10/22/2020 0.88 0.50 - 0.99 mg/dL Final    Comment:    For patients >87 years of age, the reference limit for Creatinine is approximately 13% higher for people identified as African-American. .          Passed - K in normal range and within 360 days    Potassium  Date  Value Ref Range Status  10/22/2020 4.1 3.5 - 5.3 mmol/L Final  02/20/2015 3.9 mmol/L Final    Comment:    3.5-5.1 NOTE: New Reference Range  01/30/15          Passed - Na in normal range and within 360 days    Sodium  Date Value Ref Range Status  10/22/2020 141 135 - 146 mmol/L Final  07/09/2016 144 134 - 144 mmol/L Final  02/20/2015 136 mmol/L Final    Comment:    135-145 NOTE: New Reference Range  01/30/15

## 2021-05-10 NOTE — Progress Notes (Signed)
Name: Deanna Baker   MRN: [id number]    DOB: 03/08/1961   Date:05/13/2021       Progress Note  Subjective  Chief Complaint  Follow up   HPI   GERD: she had daily symptoms, she stopped PPI on her own, she had EGD and colonoscopy 2021 and showed mild hiatal hernia. She is doing well at this time , but states cannot wear CPAP because it causes burning inside anterior chest/esophagus. Discussed going back for titration study or auto pap but she would like to hold off for now    HTN: bp is slightly elevated today, she is only taking HCTZ 12.5 mg daily, she states it oscillates, but most of the time 130's-140's, DBP can go up to 100 but she has not been using her CPAP due to her anterior chest feels like it is burning. Discussed likely the cause of DBP elevation    Seasonal Affective disorder: she is doing well, off all medications.    Weight loss: she  lost 18 lbs over the past 12 months, and 10 lbs the 3 months prior to her last visit, she went to GI and is up to date with all screening tests. Weight is down another 5 lbs, she states all started when she stopped taking all her chronic medications, except for bp medication. She still takes gabapentin prn only    FMS: she is only taking Gabapentin and ibuprofen prn now, less than once a week on average. . She states she has pain all over pain at this time is 3/10, but still has constant fatigue and mental fogginess. She has chronic fatigue syndrome, states symptoms of fatigue worse over the past 5 days.    Vasomotor rhinitis she went back to Dr. Tami Ribas for recurrent clear rhinorrhea and vomiting when showering, had a CT sinus that per patient was negative ( unable to see it) she was given nasal spray but not using it. She also gags when brushing her teeth. Discussed trying baths instead of showers since that also bothers her but she has difficulty getting in the bathtub. Unchanged, evaluated by ENT and GI , unchanged    Dyslipidemia: low risk of  heart attack and strokes in the next 10 years and not interested in statin therapy . Unchanged   The 10-year ASCVD risk score Mikey Bussing DC Jr., et al., 2013) is: 6.6%   Values used to calculate the score:     Age: 60 years     Sex: Female     Is Non-Hispanic African American: No     Diabetic: No     Tobacco smoker: No     Systolic Blood Pressure: 219 mmHg     Is BP treated: Yes     HDL Cholesterol: 59 mg/dL     Total Cholesterol: 248 mg/dL   History of B12 deficiency: stopped all supplementation and we will recheck level  Patient Active Problem List   Diagnosis Date Noted   Gastroesophageal reflux disease    B12 deficiency 03/29/2018   Cognitive decline 01/02/2017   Sleep apnea 07/11/2016   Smell or taste sensation disturbance 05/12/2016   Personal history of colonic polyps    Benign neoplasm of transverse colon    Rectal polyp    Tenosynovitis of thumb 03/12/2016   Bilateral knee pain 03/12/2016   Reflux esophagitis    Stricture and stenosis of esophagus    Thickening of esophagus 08/03/2015   Benign essential HTN 05/01/2015   CFIDS (chronic fatigue  and immune dysfunction syndrome) ([place]) 05/01/2015   Major depression, recurrent, chronic ([place]) 05/01/2015   Dyslipidemia 05/01/2015   Fibromyalgia syndrome 05/01/2015   Blood glucose elevated 05/01/2015   Eczema intertrigo 05/01/2015   Migraine without aura and without status migrainosus, not intractable 05/01/2015   Excessive urination at night 53/66/4403   Dysmetabolic syndrome 47/42/5956   History of colonoscopy with polypectomy 05/01/2015   Central sleep apnea 05/01/2015   History of back surgery 05/01/2015   Thickened nails 05/01/2015    Past Surgical History:  Procedure Laterality Date   BACK SURGERY     BREAST BIOPSY Left 10/2012   +   BREAST EXCISIONAL BIOPSY Left 11/2012   + 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   COLONOSCOPY  2013   Dr.  Suzette Battiest    COLONOSCOPY WITH PROPOFOL N/A 04/28/2016   Procedure: COLONOSCOPY WITH PROPOFOL;  Surgeon: Lucilla Lame, MD;  Location: [place];  Service: Endoscopy;  Laterality: N/A;  CPAP   COLONOSCOPY WITH PROPOFOL N/A 04/10/2020   Procedure: COLONOSCOPY WITH PROPOFOL;  Surgeon: Lucilla Lame, MD;  Location: [facility] ENDOSCOPY;  Service: Endoscopy;  Laterality: N/A;   ESOPHAGOGASTRODUODENOSCOPY (EGD) WITH PROPOFOL N/A 10/23/2015   Procedure: ESOPHAGOGASTRODUODENOSCOPY (EGD) WITH PROPOFOL;  Surgeon: Lucilla Lame, MD;  Location: [facility] ENDOSCOPY;  Service: Endoscopy;  Laterality: N/A;   ESOPHAGOGASTRODUODENOSCOPY (EGD) WITH PROPOFOL N/A 04/10/2020   Procedure: ESOPHAGOGASTRODUODENOSCOPY (EGD) WITH PROPOFOL;  Surgeon: Lucilla Lame, MD;  Location: [facility] ENDOSCOPY;  Service: Endoscopy;  Laterality: N/A;   POLYPECTOMY  04/28/2016   Procedure: POLYPECTOMY;  Surgeon: Lucilla Lame, MD;  Location: [name];  Service: Endoscopy;;   SHOULDER SURGERY Left 2009   TONSILLECTOMY  1965   TUBAL LIGATION  1982   UPPER GI ENDOSCOPY     Dr. Suzette Battiest    Family History  Problem Relation Age of Onset   Breast cancer Mother 53   Diabetes Mother    Gout Mother    Kidney failure Mother    Hypertension Mother    Congestive Heart Failure Mother    Stomach cancer Maternal Aunt    Brain cancer Paternal Uncle        x 2 mat uncles   Heart attack Maternal Grandmother    Hypertension Maternal Grandmother    Stroke Maternal Grandmother    Epilepsy Maternal Aunt     Social History   Tobacco Use   Smoking status: Former    Packs/day: 0.50    Years: 4.00    Pack years: 2.00    Types: Cigarettes    Start date: 11/24/1976    Quit date: 11/24/1980    Years since quitting: 40.4   Smokeless tobacco: Never  Substance Use Topics   Alcohol use: Yes    Alcohol/week: 0.0 standard drinks    Comment: rarely, 1-2x/mo glass of wine     Current Outpatient Medications:    ibuprofen (ADVIL) 400 MG tablet, Take  400 mg by mouth every 6 (six) hours as needed., Disp: , Rfl:    Nerve Stimulator (STANDARD TENS) DEVI, 1 Units by Does not apply route daily., Disp: 1 Device, Rfl: 0   spironolactone-hydrochlorothiazide (ALDACTAZIDE) 25-25 MG tablet, Take 0.5 tablets by mouth daily., Disp: 45 tablet, Rfl: 0  Allergies  Allergen Reactions   Ace Inhibitors Swelling   Benicar [Olmesartan] Other (See Comments)    Chest Pressure and Allergic Reaction   Pregabalin     weight  gain and blurred vision   Venlafaxine     foggy minded    I personally reviewed active problem list, medication list, allergies, family history, social history, health maintenance with the patient/caregiver today.   ROS  Constitutional: Negative for fever , positive for weight change.  Respiratory: Negative for cough and shortness of breath.   Cardiovascular: Negative for chest pain or palpitations.  Gastrointestinal: Negative for abdominal pain, no bowel changes.  Musculoskeletal: Negative for gait problem or joint swelling.  Skin: Negative for rash.  Neurological: Negative for dizziness or headache.  No other specific complaints in a complete review of systems (except as listed in HPI above).   Objective  Vitals:   05/13/21 1143  BP: (!) 148/84  Pulse: 89  Resp: 16  Temp: 98.3 F (36.8 C)  TempSrc: Oral  SpO2: 99%  Weight: 159 lb (72.1 kg)  Height: 5' (1.524 m)    Body mass index is 31.05 kg/m.  Physical Exam  Constitutional: Patient appears well-developed and well-nourished. Obese No distress.  HEENT: head atraumatic, normocephalic, pupils equal and reactive to light, neck supple, throat within normal limits Cardiovascular: Normal rate, regular rhythm and normal heart sounds.  No murmur heard. No BLE edema. Pulmonary/Chest: Effort normal and breath sounds normal. No respiratory distress. Abdominal: Soft.  There is no tenderness. Muscular Skeletal: trigger point positive  Psychiatric: Patient has a normal mood  and affect. behavior is normal. Judgment and thought content normal.    PHQ2/9: Depression screen [facility] 2/9 05/13/2021 10/30/2020 01/13/2020 10/12/2019 07/06/2019  Decreased Interest 1 0 0 0 0  Down, Depressed, Hopeless 0 0 1 0 0  PHQ - 2 Score 1 0 1 0 0  Altered sleeping 0 0 0 0 0  Tired, decreased energy 3 2 2 2  0  Change in appetite 0 0 2 0 0  Feeling bad or failure about yourself  0 1 1 0 0  Trouble concentrating 0 1 0 1 0  Moving slowly or fidgety/restless 0 1 0 0 0  Suicidal thoughts 0 0 0 0 0  PHQ-9 Score 4 5 6 3  0  Difficult doing work/chores - - Somewhat difficult - Not difficult at all  Some recent data might be hidden    phq 9 is positive   Fall Risk: Fall Risk  05/13/2021 10/30/2020 01/13/2020 10/12/2019 07/06/2019  Falls in the past year? 0 0 0 1 0  Comment - - - - -  Number falls in past yr: 0 0 0 0 0  Comment - - - Walking her dog after it rained -  Injury with Fall? 0 0 0 1 0  Comment - - - Contusion on her left knee -  Risk for fall due to : - - - - -  Risk for fall due to: Comment - - - - -  Follow up - - - - Falls evaluation completed    Functional Status Survey: Is the patient deaf or have difficulty hearing?: No Does the patient have difficulty seeing, even when wearing glasses/contacts?: Yes Does the patient have difficulty concentrating, remembering, or making decisions?: Yes Does the patient have difficulty walking or climbing stairs?: No Does the patient have difficulty dressing or bathing?: No Does the patient have difficulty doing errands alone such as visiting a doctor's office or shopping?: No    Assessment & Plan  1. Benign essential HTN  Changing from HCTZ to Aldactize to keep bp in the 130 range more often  - spironolactone-hydrochlorothiazide (ALDACTAZIDE)  25-25 MG tablet; Take 0.5 tablets by mouth daily.  Dispense: 45 tablet; Refill: 0  2. Obstructive sleep apnea syndrome  Re-consider CPAP titration   3. Fibromyalgia syndrome   4.  Dyslipidemia   5. Gastroesophageal reflux disease without esophagitis   6. CFIDS (chronic fatigue and immune dysfunction syndrome) ([facility])   7. Migraine without aura and without status migrainosus, not intractable   8. B12 deficiency  - Vitamin B12 - it was high last time, she asked to recheck today   9. Hyperglycemia   10. Vasomotor rhinitis  Stable   11. Seasonal affective disorder ([place])  Off medications at this time

## 2021-05-13 ENCOUNTER — Encounter: Payer: Self-pay | Admitting: Family Medicine

## 2021-05-13 ENCOUNTER — Other Ambulatory Visit: Payer: Self-pay

## 2021-05-13 ENCOUNTER — Ambulatory Visit (INDEPENDENT_AMBULATORY_CARE_PROVIDER_SITE_OTHER): Payer: 59 | Admitting: Family Medicine

## 2021-05-13 VITALS — BP 148/84 | HR 89 | Temp 98.3°F | Resp 16 | Ht 60.0 in | Wt 159.0 lb

## 2021-05-13 DIAGNOSIS — I10 Essential (primary) hypertension: Secondary | ICD-10-CM

## 2021-05-13 DIAGNOSIS — G43009 Migraine without aura, not intractable, without status migrainosus: Secondary | ICD-10-CM

## 2021-05-13 DIAGNOSIS — M797 Fibromyalgia: Secondary | ICD-10-CM

## 2021-05-13 DIAGNOSIS — E785 Hyperlipidemia, unspecified: Secondary | ICD-10-CM

## 2021-05-13 DIAGNOSIS — G4733 Obstructive sleep apnea (adult) (pediatric): Secondary | ICD-10-CM | POA: Diagnosis not present

## 2021-05-13 DIAGNOSIS — R5382 Chronic fatigue, unspecified: Secondary | ICD-10-CM

## 2021-05-13 DIAGNOSIS — R739 Hyperglycemia, unspecified: Secondary | ICD-10-CM

## 2021-05-13 DIAGNOSIS — F338 Other recurrent depressive disorders: Secondary | ICD-10-CM

## 2021-05-13 DIAGNOSIS — D8989 Other specified disorders involving the immune mechanism, not elsewhere classified: Secondary | ICD-10-CM

## 2021-05-13 DIAGNOSIS — J300 Vasomotor rhinitis: Secondary | ICD-10-CM

## 2021-05-13 DIAGNOSIS — K219 Gastro-esophageal reflux disease without esophagitis: Secondary | ICD-10-CM

## 2021-05-13 DIAGNOSIS — E538 Deficiency of other specified B group vitamins: Secondary | ICD-10-CM

## 2021-05-13 MED ORDER — SPIRONOLACTONE-HCTZ 25-25 MG PO TABS: 0.5000 | ORAL_TABLET | Freq: Every day | ORAL | 0 refills | Status: DC

## 2021-05-13 NOTE — Patient Instructions (Signed)
Take two of the HCTZ until you run out , after that you will take half a pill of aldactone hctz 25/25 mg

## 2021-05-14 LAB — VITAMIN B12: Vitamin B-12: 593 pg/mL (ref 200–1100)

## 2021-07-30 ENCOUNTER — Other Ambulatory Visit: Payer: Self-pay | Admitting: Family Medicine

## 2021-07-30 DIAGNOSIS — I10 Essential (primary) hypertension: Secondary | ICD-10-CM

## 2021-10-28 ENCOUNTER — Other Ambulatory Visit: Payer: Self-pay | Admitting: Family Medicine

## 2021-10-28 ENCOUNTER — Telehealth: Payer: Self-pay

## 2021-10-28 DIAGNOSIS — E538 Deficiency of other specified B group vitamins: Secondary | ICD-10-CM

## 2021-10-28 DIAGNOSIS — I10 Essential (primary) hypertension: Secondary | ICD-10-CM

## 2021-10-28 DIAGNOSIS — E785 Hyperlipidemia, unspecified: Secondary | ICD-10-CM

## 2021-10-28 DIAGNOSIS — R739 Hyperglycemia, unspecified: Secondary | ICD-10-CM

## 2021-10-28 NOTE — Telephone Encounter (Signed)
Copied from [place] [id number]. Topic: General - Other >> Oct 28, 2021  8:48 AM Valere Dross wrote: Reason for CRM: Pt called in stating she has her CPE coming up and needed to get the labs ordered and if a nurse could give her a call to let her know when she can come in. Please advise

## 2021-10-28 NOTE — Telephone Encounter (Signed)
Patient aware.

## 2021-10-29 LAB — CBC WITH DIFFERENTIAL/PLATELET
Absolute Monocytes: 416 cells/uL (ref 200–950)
Basophils Absolute: 19 cells/uL (ref 0–200)
Basophils Relative: 0.3 %
Eosinophils Absolute: 164 cells/uL (ref 15–500)
Eosinophils Relative: 2.6 %
HCT: 38.6 % (ref 35.0–45.0)
Hemoglobin: 12.8 g/dL (ref 11.7–15.5)
Lymphs Abs: 1733 cells/uL (ref 850–3900)
MCH: 29.3 pg (ref 27.0–33.0)
MCHC: 33.2 g/dL (ref 32.0–36.0)
MCV: 88.3 fL (ref 80.0–100.0)
MPV: 10.8 fL (ref 7.5–12.5)
Monocytes Relative: 6.6 %
Neutro Abs: 3969 cells/uL (ref 1500–7800)
Neutrophils Relative %: 63 %
Platelets: 227 10*3/uL (ref 140–400)
RBC: 4.37 10*6/uL (ref 3.80–5.10)
RDW: 12.7 % (ref 11.0–15.0)
Total Lymphocyte: 27.5 %
WBC: 6.3 10*3/uL (ref 3.8–10.8)

## 2021-10-29 LAB — COMPLETE METABOLIC PANEL WITH GFR
AG Ratio: 1.9 (calc) (ref 1.0–2.5)
ALT: 12 U/L (ref 6–29)
AST: 19 U/L (ref 10–35)
Albumin: 4.1 g/dL (ref 3.6–5.1)
Alkaline phosphatase (APISO): 88 U/L (ref 37–153)
BUN: 16 mg/dL (ref 7–25)
CO2: 28 mmol/L (ref 20–32)
Calcium: 9.8 mg/dL (ref 8.6–10.4)
Chloride: 103 mmol/L (ref 98–110)
Creat: 0.95 mg/dL (ref 0.50–1.05)
Globulin: 2.2 g/dL (calc) (ref 1.9–3.7)
Glucose, Bld: 101 mg/dL — ABNORMAL HIGH (ref 65–99)
Potassium: 3.8 mmol/L (ref 3.5–5.3)
Sodium: 140 mmol/L (ref 135–146)
Total Bilirubin: 0.5 mg/dL (ref 0.2–1.2)
Total Protein: 6.3 g/dL (ref 6.1–8.1)
eGFR: 68 mL/min/{1.73_m2} (ref >=60)

## 2021-10-29 LAB — LIPID PANEL
Cholesterol: 229 mg/dL — ABNORMAL HIGH (ref <200)
HDL: 58 mg/dL (ref >=50)
LDL Cholesterol (Calc): 145 mg/dL (calc) — ABNORMAL HIGH
Non-HDL Cholesterol (Calc): 171 mg/dL (calc) — ABNORMAL HIGH (ref <130)
Total CHOL/HDL Ratio: 3.9 (calc) (ref <5.0)
Triglycerides: 135 mg/dL (ref <150)

## 2021-10-29 LAB — HEMOGLOBIN A1C
Hgb A1c MFr Bld: 5.4 % of total Hgb (ref <5.7)
Mean Plasma Glucose: 108 mg/dL
eAG (mmol/L): 6 mmol/L

## 2021-10-29 LAB — VITAMIN B12: Vitamin B-12: 416 pg/mL (ref 200–1100)

## 2021-10-30 NOTE — Progress Notes (Signed)
Name: Deanna Baker   MRN: [id number]    DOB: 09/05/1961   Date:10/31/2021       Progress Note  Subjective  Chief Complaint  Chief Complaint  Patient presents with   Annual Exam   Follow-up    HPI  GERD: she had daily symptoms, she stopped PPI on her own, she had EGD and colonoscopy 2021 and showed mild hiatal hernia. She is doing well at this time , but states cannot wear CPAP because it causes burning inside anterior chest/esophagus. She does not want to go back for repeat study    HTN: bp was elevated last visit and we switched from HCTZ 12.5 mg to Aldactizide to take half daily, she tried it for 3 days but stopped because it caused chest pain. She states bp at home in the 160/100 most of the time , but yesterday it was towards low end of normal , she is worried that her bp goes up and down, we discussed referral to cardiologist but she would like to try a new medication first. We will try low dose of Norvasc for now.   Seasonal Affective disorder: phq 9 is  negative today  She has seasonal affective disorder but does not want medication. Discussed importance being exposed to sun light as much as possible. She is walking with her dog for at least 30 minutes daily    Weight loss: she had  lost 18 lbs in the year prior to her last visit., weight is up 6 lbs since last visit    FMS: she is only taking Gabapentin and ibuprofen prn now, less than once a week on average. . She states she has pain all over pain at this time is 3/10, but still has constant fatigue and mental fogginess. She has chronic fatigue syndrome, she has been stable.    Vasomotor rhinitis she went back to Dr. Tami Ribas for recurrent clear rhinorrhea and vomiting when showering, had a CT sinus that per patient was negative ( unable to see it) she was given nasal spray but not using it. She also gags when brushing her teeth. Discussed trying baths instead of showers since that also bothers her but she has difficulty getting in  the bathtub. She was evaluated by GI and ENT and is unchanged.    Dyslipidemia: low risk of heart attack and strokes in the next 10 years and not interested in statin therapy . Discussed risk below. We will try to get her bp at goal   The 10-year ASCVD risk score (Arnett DK, et al., 2019) is: 16.8%   Values used to calculate the score:     Age: 60 years     Sex: Female     Is Non-Hispanic African American: No     Diabetic: No     Tobacco smoker: Yes     Systolic Blood Pressure: 761 mmHg     Is BP treated: Yes     HDL Cholesterol: 58 mg/dL     Total Cholesterol: 229 mg/dL    B12 deficiency: advised to resume supplementation at 500 mcg daily SL    Patient Active Problem List   Diagnosis Date Noted   Gastroesophageal reflux disease    B12 deficiency 03/29/2018   Cognitive decline 01/02/2017   Sleep apnea 07/11/2016   Smell or taste sensation disturbance 05/12/2016   Personal history of colonic polyps    Benign neoplasm of transverse colon    Rectal polyp    Tenosynovitis of thumb  03/12/2016   Bilateral knee pain 03/12/2016   Reflux esophagitis    Stricture and stenosis of esophagus    Thickening of esophagus 08/03/2015   Benign essential HTN 05/01/2015   CFIDS (chronic fatigue and immune dysfunction syndrome) ([place]) 05/01/2015   Major depression, recurrent, chronic ([place]) 05/01/2015   Dyslipidemia 05/01/2015   Fibromyalgia syndrome 05/01/2015   Blood glucose elevated 05/01/2015   Eczema intertrigo 05/01/2015   Migraine without aura and without status migrainosus, not intractable 05/01/2015   Excessive urination at night 58/07/9832   Dysmetabolic syndrome 82/50/5397   History of colonoscopy with polypectomy 05/01/2015   Central sleep apnea 05/01/2015   History of back surgery 05/01/2015   Thickened nails 05/01/2015    Past Surgical History:  Procedure Laterality Date   BACK SURGERY     BREAST BIOPSY Left 10/2012   +   BREAST EXCISIONAL BIOPSY Left 11/2012   + 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   COLONOSCOPY  2013   Dr. Suzette Battiest    COLONOSCOPY WITH PROPOFOL N/A 04/28/2016   Procedure: COLONOSCOPY WITH PROPOFOL;  Surgeon: Lucilla Lame, MD;  Location: [facility];  Service: Endoscopy;  Laterality: N/A;  CPAP   COLONOSCOPY WITH PROPOFOL N/A 04/10/2020   Procedure: COLONOSCOPY WITH PROPOFOL;  Surgeon: Lucilla Lame, MD;  Location: [facility] ENDOSCOPY;  Service: Endoscopy;  Laterality: N/A;   ESOPHAGOGASTRODUODENOSCOPY (EGD) WITH PROPOFOL N/A 10/23/2015   Procedure: ESOPHAGOGASTRODUODENOSCOPY (EGD) WITH PROPOFOL;  Surgeon: Lucilla Lame, MD;  Location: [facility] ENDOSCOPY;  Service: Endoscopy;  Laterality: N/A;   ESOPHAGOGASTRODUODENOSCOPY (EGD) WITH PROPOFOL N/A 04/10/2020   Procedure: ESOPHAGOGASTRODUODENOSCOPY (EGD) WITH PROPOFOL;  Surgeon: Lucilla Lame, MD;  Location: [facility] ENDOSCOPY;  Service: Endoscopy;  Laterality: N/A;   POLYPECTOMY  04/28/2016   Procedure: POLYPECTOMY;  Surgeon: Lucilla Lame, MD;  Location: [facility];  Service: Endoscopy;;   SHOULDER SURGERY Left 2009   TONSILLECTOMY  1965   TUBAL LIGATION  1982   UPPER GI ENDOSCOPY     Dr. Suzette Battiest    Family History  Problem Relation Age of Onset   Breast cancer Mother 1   Diabetes Mother    Gout Mother    Kidney failure Mother    Hypertension Mother    Congestive Heart Failure Mother    Stomach cancer Maternal Aunt    Brain cancer Paternal Uncle        x 2 mat uncles   Heart attack Maternal Grandmother    Hypertension Maternal Grandmother    Stroke Maternal Grandmother    Epilepsy Maternal Aunt     Social History   Tobacco Use   Smoking status: Former    Packs/day: 0.50    Years: 4.00    Pack years: 2.00    Types: Cigarettes    Start date: 11/24/1976    Quit date: 11/24/1980    Years since quitting: 40.9   Smokeless tobacco: Never  Substance Use Topics   Alcohol use: Yes    Alcohol/week: 0.0 standard  drinks    Comment: rarely, 1-2x/mo glass of wine     Current Outpatient Medications:    amLODipine (NORVASC) 2.5 MG tablet, Take 1 tablet (2.5 mg total) by mouth daily., Disp: 30 tablet, Rfl: 0   Cyanocobalamin (VITAMIN B-12) 500 MCG SUBL, Place 1 tablet (500 mcg total) under the tongue daily at 12 noon., Disp: 100 tablet, Rfl: 1   ibuprofen (ADVIL) 400 MG tablet, Take 400  mg by mouth every 6 (six) hours as needed., Disp: , Rfl:    Nerve Stimulator (STANDARD TENS) DEVI, 1 Units by Does not apply route daily., Disp: 1 Device, Rfl: 0  Allergies  Allergen Reactions   Ace Inhibitors Swelling   Benicar [Olmesartan] Other (See Comments)    Chest Pressure and Allergic Reaction   Pregabalin     weight gain and blurred vision   Spironolactone     Chest pain    Venlafaxine     foggy minded    I personally reviewed active problem list, medication list, allergies, family history, social history with the patient/caregiver today.   ROS  Constitutional: Negative for fever, positive for mild  weight change.  Respiratory: Negative for cough and shortness of breath.   Cardiovascular: Negative for chest pain or palpitations.  Gastrointestinal: Negative for abdominal pain, no bowel changes.  Musculoskeletal: Negative for gait problem or joint swelling.  Skin: Negative for rash.  Neurological: Negative for dizziness or headache.  No other specific complaints in a complete review of systems (except as listed in HPI above).   Objective  Vitals:   10/31/21 0937 10/31/21 0953  BP: (!) 160/88 (!) 164/82  Pulse: 76   Resp: 16   Temp: 98.1 F (36.7 C)   TempSrc: Oral   SpO2: 99%   Weight: 165 lb 14.4 oz (75.3 kg)   Height: 5' (1.524 m)     Body mass index is 32.4 kg/m.  Physical Exam  Constitutional: Patient appears well-developed and well-nourished. Obese  No distress.  HEENT: head atraumatic, normocephalic, pupils equal and reactive to light,  neck supple Cardiovascular: Normal  rate, regular rhythm and normal heart sounds.  No murmur heard. No BLE edema. Pulmonary/Chest: Effort normal and breath sounds normal. No respiratory distress. Abdominal: Soft.  There is no tenderness. Psychiatric: Patient has a normal mood and affect. behavior is normal. Judgment and thought content normal.   Recent Results (from the past 2160 hour(s))  CBC with Differential/Platelet     Status: None   Collection Time: 10/28/21  3:12 PM  Result Value Ref Range   WBC 6.3 3.8 - 10.8 Thousand/uL   RBC 4.37 3.80 - 5.10 Million/uL   Hemoglobin 12.8 11.7 - 15.5 g/dL   HCT 38.6 35.0 - 45.0 %   MCV 88.3 80.0 - 100.0 fL   MCH 29.3 27.0 - 33.0 pg   MCHC 33.2 32.0 - 36.0 g/dL   RDW 12.7 11.0 - 15.0 %   Platelets 227 140 - 400 Thousand/uL   MPV 10.8 7.5 - 12.5 fL   Neutro Abs 3,969 1,500 - 7,800 cells/uL   Lymphs Abs 1,733 850 - 3,900 cells/uL   Absolute Monocytes 416 200 - 950 cells/uL   Eosinophils Absolute 164 15 - 500 cells/uL   Basophils Absolute 19 0 - 200 cells/uL   Neutrophils Relative % 63 %   Total Lymphocyte 27.5 %   Monocytes Relative 6.6 %   Eosinophils Relative 2.6 %   Basophils Relative 0.3 %  COMPLETE METABOLIC PANEL WITH GFR     Status: Abnormal   Collection Time: 10/28/21  3:12 PM  Result Value Ref Range   Glucose, Bld 101 (H) 65 - 99 mg/dL    Comment: .            Fasting reference interval . For someone without known diabetes, a glucose value between 100 and 125 mg/dL is consistent with prediabetes and should be confirmed with a follow-up test. .  BUN 16 7 - 25 mg/dL   Creat 0.95 0.50 - 1.05 mg/dL   eGFR 68 > OR = 60 mL/min/1.82m2    Comment: The eGFR is based on the CKD-EPI 2021 equation. To calculate  the new eGFR from a previous Creatinine or Cystatin C result, go to https://www.kidney.org/professionals/ kdoqi/gfr%5Fcalculator    BUN/Creatinine Ratio NOT APPLICABLE 6 - 22 (calc)   Sodium 140 135 - 146 mmol/L   Potassium 3.8 3.5 - 5.3 mmol/L    Chloride 103 98 - 110 mmol/L   CO2 28 20 - 32 mmol/L   Calcium 9.8 8.6 - 10.4 mg/dL   Total Protein 6.3 6.1 - 8.1 g/dL   Albumin 4.1 3.6 - 5.1 g/dL   Globulin 2.2 1.9 - 3.7 g/dL (calc)   AG Ratio 1.9 1.0 - 2.5 (calc)   Total Bilirubin 0.5 0.2 - 1.2 mg/dL   Alkaline phosphatase (APISO) 88 37 - 153 U/L   AST 19 10 - 35 U/L   ALT 12 6 - 29 U/L  Lipid panel     Status: Abnormal   Collection Time: 10/28/21  3:12 PM  Result Value Ref Range   Cholesterol 229 (H) <200 mg/dL   HDL 58 > OR = 50 mg/dL   Triglycerides 135 <150 mg/dL   LDL Cholesterol (Calc) 145 (H) mg/dL (calc)    Comment: Reference range: <100 . Desirable range <100 mg/dL for primary prevention;   <70 mg/dL for patients with CHD or diabetic patients  with > or = 2 CHD risk factors. Marland Kitchen LDL-C is now calculated using the Martin-Hopkins  calculation, which is a validated novel method providing  better accuracy than the Friedewald equation in the  estimation of LDL-C.  Cresenciano Genre et al. Annamaria Helling. 4503;888(28): 2061-2068  (http://education.[facility].com/faq/FAQ164)    Total CHOL/HDL Ratio 3.9 <5.0 (calc)   Non-HDL Cholesterol (Calc) 171 (H) <130 mg/dL (calc)    Comment: For patients with diabetes plus 1 major ASCVD risk  factor, treating to a non-HDL-C goal of <100 mg/dL  (LDL-C of <70 mg/dL) is considered a therapeutic  option.   Hemoglobin A1c     Status: None   Collection Time: 10/28/21  3:12 PM  Result Value Ref Range   Hgb A1c MFr Bld 5.4 <5.7 % of total Hgb    Comment: For the purpose of screening for the presence of diabetes: . <5.7%       Consistent with the absence of diabetes 5.7-6.4%    Consistent with increased risk for diabetes             (prediabetes) > or =6.5%  Consistent with diabetes . This assay result is consistent with a decreased risk of diabetes. . Currently, no consensus exists regarding use of hemoglobin A1c for diagnosis of diabetes in children. . According to American Diabetes  Association (ADA) guidelines, hemoglobin A1c <7.0% represents optimal control in non-pregnant diabetic patients. Different metrics may apply to specific patient populations.  Standards of Medical Care in Diabetes(ADA). .    Mean Plasma Glucose 108 mg/dL   eAG (mmol/L) 6.0 mmol/L  Vitamin B12     Status: None   Collection Time: 10/28/21  3:12 PM  Result Value Ref Range   Vitamin B-12 416 200 - 1,100 pg/mL      PHQ2/9: Depression screen [facility] 2/9 10/31/2021 05/13/2021 10/30/2020 01/13/2020 10/12/2019  Decreased Interest 0 1 0 0 0  Down, Depressed, Hopeless 0 0 0 1 0  PHQ - 2 Score 0 1 0 1 0  Altered sleeping 0 0 0 0 0  Tired, decreased energy 0 $Remove'3 2 2 2  'gFhGCgv$ Change in appetite 0 0 0 2 0  Feeling bad or failure about yourself  0 0 1 1 0  Trouble concentrating 0 0 1 0 1  Moving slowly or fidgety/restless 0 0 1 0 0  Suicidal thoughts 0 0 0 0 0  PHQ-9 Score 0 $Remov'4 5 6 3  'xSJniQ$ Difficult doing work/chores Not difficult at all - - Somewhat difficult -  Some recent data might be hidden    phq 9 is negative   Fall Risk: Fall Risk  10/31/2021 05/13/2021 10/30/2020 01/13/2020 10/12/2019  Falls in the past year? 0 0 0 0 1  Comment - - - - -  Number falls in past yr: 0 0 0 0 0  Comment - - - - Walking her dog after it rained  Injury with Fall? 0 0 0 0 1  Comment - - - - Contusion on her left knee  Risk for fall due to : No Fall Risks - - - -  Risk for fall due to: Comment - - - - -  Follow up Falls prevention discussed - - - -      Functional Status Survey: Is the patient deaf or have difficulty hearing?: No Does the patient have difficulty seeing, even when wearing glasses/contacts?: No Does the patient have difficulty concentrating, remembering, or making decisions?: No Does the patient have difficulty walking or climbing stairs?: No Does the patient have difficulty dressing or bathing?: No Does the patient have difficulty doing errands alone such as visiting a doctor's office or shopping?:  No    Assessment & Plan  1. Dyslipidemia  Refused therapy   2. Benign essential HTN  - amLODipine (NORVASC) 2.5 MG tablet; Take 1 tablet (2.5 mg total) by mouth daily.  Dispense: 30 tablet; Refill: 0  3. B12 deficiency  - Cyanocobalamin (VITAMIN B-12) 500 MCG SUBL; Place 1 tablet (500 mcg total) under the tongue daily at 12 noon.  Dispense: 100 tablet; Refill: 1  4. Fibromyalgia syndrome   5. Obstructive sleep apnea syndrome   6. Gastroesophageal reflux disease without esophagitis   Off medication

## 2021-10-31 ENCOUNTER — Encounter: Payer: Self-pay | Admitting: Family Medicine

## 2021-10-31 ENCOUNTER — Ambulatory Visit (INDEPENDENT_AMBULATORY_CARE_PROVIDER_SITE_OTHER): Payer: 59 | Admitting: Family Medicine

## 2021-10-31 VITALS — BP 164/82 | HR 76 | Temp 98.1°F | Resp 16 | Ht 60.0 in | Wt 165.9 lb

## 2021-10-31 DIAGNOSIS — M797 Fibromyalgia: Secondary | ICD-10-CM

## 2021-10-31 DIAGNOSIS — I10 Essential (primary) hypertension: Secondary | ICD-10-CM

## 2021-10-31 DIAGNOSIS — E538 Deficiency of other specified B group vitamins: Secondary | ICD-10-CM | POA: Diagnosis not present

## 2021-10-31 DIAGNOSIS — E785 Hyperlipidemia, unspecified: Secondary | ICD-10-CM

## 2021-10-31 DIAGNOSIS — K219 Gastro-esophageal reflux disease without esophagitis: Secondary | ICD-10-CM

## 2021-10-31 DIAGNOSIS — Z0000 Encounter for general adult medical examination without abnormal findings: Secondary | ICD-10-CM

## 2021-10-31 DIAGNOSIS — G4733 Obstructive sleep apnea (adult) (pediatric): Secondary | ICD-10-CM

## 2021-10-31 MED ORDER — AMLODIPINE BESYLATE 2.5 MG PO TABS: 2.5000 mg | ORAL_TABLET | Freq: Every day | ORAL | 0 refills | Status: DC

## 2021-10-31 MED ORDER — VITAMIN B-12 500 MCG SL SUBL: 1.0000 | SUBLINGUAL_TABLET | Freq: Every day | SUBLINGUAL | 1 refills | Status: DC

## 2021-11-21 ENCOUNTER — Encounter: Payer: Self-pay | Admitting: Urgent Care

## 2021-11-24 ENCOUNTER — Other Ambulatory Visit: Payer: Self-pay | Admitting: Family Medicine

## 2021-11-24 DIAGNOSIS — I10 Essential (primary) hypertension: Secondary | ICD-10-CM

## 2021-12-02 NOTE — Progress Notes (Signed)
Name: Deanna Baker   MRN: [id number]    DOB: [birth year]   Date:12/03/2021       Progress Note  Subjective  Chief Complaint  Annual Exam  HPI  Patient presents for annual CPE.  Diet: trying to eat healthy  Exercise: she has been active    [employer] Visit from 12/03/2021 in [facility]  AUDIT-C Score 1      Depression: Phq 9 is  positive - she does not want medication   Depression screen [facility] 2/9 12/03/2021 10/31/2021 05/13/2021 10/30/2020 01/13/2020  Decreased Interest 2 0 1 0 0  Down, Depressed, Hopeless 2 0 0 0 1  PHQ - 2 Score 4 0 1 0 1  Altered sleeping 0 0 0 0 0  Tired, decreased energy 2 0 $R'3 2 2  'Op$ Change in appetite 0 0 0 0 2  Feeling bad or failure about yourself  1 0 0 1 1  Trouble concentrating 1 0 0 1 0  Moving slowly or fidgety/restless 1 0 0 1 0  Suicidal thoughts 0 0 0 0 0  PHQ-9 Score 9 0 $R'4 5 6  'Gv$ Difficult doing work/chores Somewhat difficult Not difficult at all - - Somewhat difficult  Some recent data might be hidden   GAD 7 : Generalized Anxiety Score 12/03/2021 09/30/2018 07/06/2018  Nervous, Anxious, on Edge 2 0 2  Control/stop worrying 1 0 1  Worry too much - different things 0 0 1  Trouble relaxing $RemoveBeforeDE'2 1 2  'UqYoSrHACMutRJs$ Restless $RemoveBeforeD'1 1 1  'NXiqyFIgCXvnSJ$ Easily annoyed or irritable 1 0 2  Afraid - awful might happen 0 0 0  Total GAD 7 Score $Remov'7 2 9  'UbfbLk$ Anxiety Difficulty Somewhat difficult Not difficult at all Somewhat difficult     Hypertension: BP Readings from Last 3 Encounters:  12/03/21 132/84  10/31/21 (!) 164/82  05/13/21 (!) 148/84   Obesity: Wt Readings from Last 3 Encounters:  12/03/21 165 lb (74.8 kg)  10/31/21 165 lb 14.4 oz (75.3 kg)  05/13/21 159 lb (72.1 kg)   BMI Readings from Last 3 Encounters:  12/03/21 32.22 kg/m  10/31/21 32.40 kg/m  05/13/21 31.05 kg/m     Vaccines:   Shingrix: 63-64 yo and ask insurance if covered when patient above 52 yo Pneumonia: educated and discussed with patient. Flu: educated and discussed with  patient.  Hep C Screening: 10/16/14 STD testing and prevention (HIV/chl/gon/syphilis): not interested at this time Intimate partner violence: negative Sexual History : not sexually active in over 6 months.  Menstrual History/LMP/Abnormal Bleeding: discussed post-menopausal bleeding  Incontinence Symptoms: mild and intermittent.   Breast cancer:  - Last Mammogram: 12/28/20 - BRCA gene screening: history of breast cancer   Osteopenia : Discussed high calcium and vitamin D supplementation, weight bearing exercises  Cervical cancer screening: Ordered today  Skin cancer: Discussed monitoring for atypical lesions  Colorectal cancer: 04/10/20  repeat in 10 years  Lung cancer: Low Dose CT Chest recommended if Age 72-80 years, 20 pack-year currently smoking OR have quit w/in 15years. Patient does not qualify.   ECG: 09/30/18  Advanced Care Planning: A voluntary discussion about advance care planning including the explanation and discussion of advance directives.  Discussed health care proxy and Living will, and the patient was able to identify a health care proxy as husband .  Patient does not have a living will at present time. If patient does have living will, I have requested they bring this to the clinic to be scanned  in to their chart.  Lipids: Lab Results  Component Value Date   CHOL 229 (H) 10/28/2021   CHOL 248 (H) 10/22/2020   CHOL 252 (H) 09/26/2019   Lab Results  Component Value Date   HDL 58 10/28/2021   HDL 59 10/22/2020   HDL 56 09/26/2019   Lab Results  Component Value Date   LDLCALC 145 (H) 10/28/2021   LDLCALC 165 (H) 10/22/2020   LDLCALC 164 (H) 09/26/2019   Lab Results  Component Value Date   TRIG 135 10/28/2021   TRIG 118 10/22/2020   TRIG 167 (H) 09/26/2019   Lab Results  Component Value Date   CHOLHDL 3.9 10/28/2021   CHOLHDL 4.2 10/22/2020   CHOLHDL 4.5 09/26/2019   No results found for: LDLDIRECT  Glucose: Glucose  Date Value Ref Range Status   02/20/2015 113 (H) mg/dL Final    Comment:    65-99 NOTE: New Reference Range  01/30/15   08/22/2014 94 65 - 99 mg/dL Final  02/14/2014 117 (H) 65 - 99 mg/dL Final   Glucose, Bld  Date Value Ref Range Status  10/28/2021 101 (H) 65 - 99 mg/dL Final    Comment:    .            Fasting reference interval . For someone without known diabetes, a glucose value between 100 and 125 mg/dL is consistent with prediabetes and should be confirmed with a follow-up test. .   10/22/2020 107 (H) 65 - 99 mg/dL Final    Comment:    .            Fasting reference interval . For someone without known diabetes, a glucose value between 100 and 125 mg/dL is consistent with prediabetes and should be confirmed with a follow-up test. .   09/26/2019 113 (H) 65 - 99 mg/dL Final    Comment:    .            Fasting reference interval . For someone without known diabetes, a glucose value between 100 and 125 mg/dL is consistent with prediabetes and should be confirmed with a follow-up test. .    Glucose-Capillary  Date Value Ref Range Status  08/16/2015 124 (H) 65 - 99 mg/dL Final    Patient Active Problem List   Diagnosis Date Noted   Gastroesophageal reflux disease    B12 deficiency 03/29/2018   Cognitive decline 01/02/2017   Sleep apnea 07/11/2016   Smell or taste sensation disturbance 05/12/2016   Personal history of colonic polyps    Benign neoplasm of transverse colon    Rectal polyp    Tenosynovitis of thumb 03/12/2016   Bilateral knee pain 03/12/2016   Reflux esophagitis    Stricture and stenosis of esophagus    Thickening of esophagus 08/03/2015   Benign essential HTN 05/01/2015   CFIDS (chronic fatigue and immune dysfunction syndrome) ([place]) 05/01/2015   Major depression, recurrent, chronic ([place]) 05/01/2015   Dyslipidemia 05/01/2015   Fibromyalgia syndrome 05/01/2015   Blood glucose elevated 05/01/2015   Eczema intertrigo 05/01/2015   Migraine without aura and  without status migrainosus, not intractable 05/01/2015   Excessive urination at night 03/47/4259   Dysmetabolic syndrome 56/38/7564   History of colonoscopy with polypectomy 05/01/2015   Central sleep apnea 05/01/2015   History of back surgery 05/01/2015   Thickened nails 05/01/2015    Past Surgical History:  Procedure Laterality Date   BACK SURGERY     BREAST BIOPSY Left 10/2012   +  BREAST EXCISIONAL BIOPSY Left 11/2012   + 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   COLONOSCOPY  2013   Dr. Suzette Battiest    COLONOSCOPY WITH PROPOFOL N/A 04/28/2016   Procedure: COLONOSCOPY WITH PROPOFOL;  Surgeon: Lucilla Lame, MD;  Location: [facility];  Service: Endoscopy;  Laterality: N/A;  CPAP   COLONOSCOPY WITH PROPOFOL N/A 04/10/2020   Procedure: COLONOSCOPY WITH PROPOFOL;  Surgeon: Lucilla Lame, MD;  Location: [facility] ENDOSCOPY;  Service: Endoscopy;  Laterality: N/A;   ESOPHAGOGASTRODUODENOSCOPY (EGD) WITH PROPOFOL N/A 10/23/2015   Procedure: ESOPHAGOGASTRODUODENOSCOPY (EGD) WITH PROPOFOL;  Surgeon: Lucilla Lame, MD;  Location: [facility] ENDOSCOPY;  Service: Endoscopy;  Laterality: N/A;   ESOPHAGOGASTRODUODENOSCOPY (EGD) WITH PROPOFOL N/A 04/10/2020   Procedure: ESOPHAGOGASTRODUODENOSCOPY (EGD) WITH PROPOFOL;  Surgeon: Lucilla Lame, MD;  Location[facility] ENDOSCOPY;  Service: Endoscopy;  Laterality: N/A;   POLYPECTOMY  04/28/2016   Procedure: POLYPECTOMY;  Surgeon: Lucilla Lame, MD;  Location: [facility];  Service: Endoscopy;;   SHOULDER SURGERY Left 2009   TONSILLECTOMY  1965   TUBAL LIGATION  1982   UPPER GI ENDOSCOPY     Dr. Suzette Battiest    Family History  Problem Relation Age of Onset   Breast cancer Mother 42   Diabetes Mother    Gout Mother    Kidney failure Mother    Hypertension Mother    Congestive Heart Failure Mother    Stomach cancer Maternal Aunt    Brain cancer Paternal Uncle        x 2 mat uncles    Heart attack Maternal Grandmother    Hypertension Maternal Grandmother    Stroke Maternal Grandmother    Epilepsy Maternal Aunt     Social History   Socioeconomic History   Marital status: Married    Spouse name: Christia Reading   Number of children: 1   Years of education: Not on file   Highest education level: GED or equivalent  Occupational History   Occupation: Unemployed  Tobacco Use   Smoking status: Former    Packs/day: 0.50    Years: 4.00    Pack years: 2.00    Types: Cigarettes    Start date: 11/24/1976    Quit date: 11/24/1980    Years since quitting: 41.0   Smokeless tobacco: Never  Vaping Use   Vaping Use: Never used  Substance and Sexual Activity   Alcohol use: Yes    Alcohol/week: 0.0 standard drinks    Comment: rarely, 1-2x/mo glass of wine   Drug use: No   Sexual activity: Yes    Partners: Male    Birth control/protection: Post-menopausal  Other Topics Concern   Not on file  Social History Narrative   Not on file   Social Determinants of Health   Financial Resource Strain: Low Risk    Difficulty of Paying Living Expenses: Not hard at all  Food Insecurity: No Food Insecurity   Worried About Charity fundraiser in the Last Year: Never true   [place] in the Last Year: Never true  Transportation Needs: No Transportation Needs   Lack of Transportation (Medical): No   Lack of Transportation (Non-Medical): No  Physical Activity: Sufficiently Active   Days of Exercise per Week: 6 days   Minutes of Exercise per Session: 40 min  Stress: Stress Concern Present   Feeling of Stress : Rather much  Social Connections: Socially Integrated   Frequency  of Communication with Friends and Family: More than three times a week   Frequency of Social Gatherings with Friends and Family: Twice a week   Attends Religious Services: 1 to 4 times per year   Active Member of [employer] or Organizations: Yes   Attends Music therapist: More than 4 times per year    Marital Status: Married  Human resources officer Violence: Not At Risk   Fear of Current or Ex-Partner: No   Emotionally Abused: No   Physically Abused: No   Sexually Abused: No     Current Outpatient Medications:    amLODipine (NORVASC) 2.5 MG tablet, TAKE 1 TABLET BY MOUTH EVERY DAY, Disp: 30 tablet, Rfl: 0   Cyanocobalamin (VITAMIN B-12) 500 MCG SUBL, Place 1 tablet (500 mcg total) under the tongue daily at 12 noon., Disp: 100 tablet, Rfl: 1   ibuprofen (ADVIL) 400 MG tablet, Take 400 mg by mouth every 6 (six) hours as needed., Disp: , Rfl:    Nerve Stimulator (STANDARD TENS) DEVI, 1 Units by Does not apply route daily., Disp: 1 Device, Rfl: 0   [employer] B-12 500 MCG tablet, 500 mcg daily. (Patient not taking: Reported on 12/03/2021), Disp: , Rfl:   Allergies  Allergen Reactions   Ace Inhibitors Swelling   Benicar [Olmesartan] Other (See Comments)    Chest Pressure and Allergic Reaction   Pregabalin     weight gain and blurred vision   Spironolactone     Chest pain    Venlafaxine     foggy minded     ROS  Constitutional: Negative for fever or weight change.  Respiratory: Negative for cough and shortness of breath.   Cardiovascular: Negative for chest pain or palpitations.  Gastrointestinal: Negative for abdominal pain, no bowel changes.  Musculoskeletal: Negative for gait problem or joint swelling.  Skin: Negative for rash.  Neurological: Negative for dizziness or headache.  No other specific complaints in a complete review of systems (except as listed in HPI above).   Objective  Vitals:   12/03/21 1409  BP: 132/84  Pulse: 71  Resp: 16  Temp: 98.3 F (36.8 C)  TempSrc: Oral  SpO2: 97%  Weight: 165 lb (74.8 kg)  Height: 5' (1.524 m)    Body mass index is 32.22 kg/m.  Physical Exam  Constitutional: Patient appears well-developed and well-nourished. No distress.  HENT: Head: Normocephalic and atraumatic. Ears: B TMs ok, no erythema or effusion; Nose: Not done.  Mouth/Throat: not done  Eyes: Conjunctivae and EOM are normal. Pupils are equal, round, and reactive to light. No scleral icterus.  Neck: Normal range of motion. Neck supple. No JVD present. No thyromegaly present.  Cardiovascular: Normal rate, regular rhythm and normal heart sounds.  No murmur heard. No BLE edema. Pulmonary/Chest: Effort normal and breath sounds normal. No respiratory distress. Abdominal: Soft. Bowel sounds are normal, no distension. There is no tenderness. no masses Breast: no lumps or masses, no nipple discharge or rashes FEMALE GENITALIA:  External genitalia normal External urethra normal Vaginal vault normal without discharge or lesions Cervix normal without discharge or lesions Bimanual exam normal without masses RECTAL: not done  Musculoskeletal: Normal range of motion, no joint effusions. No gross deformities Neurological: he is alert and oriented to person, place, and time. No cranial nerve deficit. Coordination, balance, strength, speech and gait are normal.  Skin: Skin is warm and dry. No rash noted. No erythema.  Psychiatric: Patient has a normal mood and affect. behavior is normal. Judgment and  thought content normal.   Recent Results (from the past 2160 hour(s))  CBC with Differential/Platelet     Status: None   Collection Time: 10/28/21  3:12 PM  Result Value Ref Range   WBC 6.3 3.8 - 10.8 Thousand/uL   RBC 4.37 3.80 - 5.10 Million/uL   Hemoglobin 12.8 11.7 - 15.5 g/dL   HCT 38.6 35.0 - 45.0 %   MCV 88.3 80.0 - 100.0 fL   MCH 29.3 27.0 - 33.0 pg   MCHC 33.2 32.0 - 36.0 g/dL   RDW 12.7 11.0 - 15.0 %   Platelets 227 140 - 400 Thousand/uL   MPV 10.8 7.5 - 12.5 fL   Neutro Abs 3,969 1,500 - 7,800 cells/uL   Lymphs Abs 1,733 850 - 3,900 cells/uL   Absolute Monocytes 416 200 - 950 cells/uL   Eosinophils Absolute 164 15 - 500 cells/uL   Basophils Absolute 19 0 - 200 cells/uL   Neutrophils Relative % 63 %   Total Lymphocyte 27.5 %   Monocytes Relative  6.6 %   Eosinophils Relative 2.6 %   Basophils Relative 0.3 %  COMPLETE METABOLIC PANEL WITH GFR     Status: Abnormal   Collection Time: 10/28/21  3:12 PM  Result Value Ref Range   Glucose, Bld 101 (H) 65 - 99 mg/dL    Comment: .            Fasting reference interval . For someone without known diabetes, a glucose value between 100 and 125 mg/dL is consistent with prediabetes and should be confirmed with a follow-up test. .    BUN 16 7 - 25 mg/dL   Creat 0.95 0.50 - 1.05 mg/dL   eGFR 68 > OR = 60 mL/min/1.47m2    Comment: The eGFR is based on the CKD-EPI 2021 equation. To calculate  the new eGFR from a previous Creatinine or Cystatin C result, go to https://www.kidney.org/professionals/ kdoqi/gfr%5Fcalculator    BUN/Creatinine Ratio NOT APPLICABLE 6 - 22 (calc)   Sodium 140 135 - 146 mmol/L   Potassium 3.8 3.5 - 5.3 mmol/L   Chloride 103 98 - 110 mmol/L   CO2 28 20 - 32 mmol/L   Calcium 9.8 8.6 - 10.4 mg/dL   Total Protein 6.3 6.1 - 8.1 g/dL   Albumin 4.1 3.6 - 5.1 g/dL   Globulin 2.2 1.9 - 3.7 g/dL (calc)   AG Ratio 1.9 1.0 - 2.5 (calc)   Total Bilirubin 0.5 0.2 - 1.2 mg/dL   Alkaline phosphatase (APISO) 88 37 - 153 U/L   AST 19 10 - 35 U/L   ALT 12 6 - 29 U/L  Lipid panel     Status: Abnormal   Collection Time: 10/28/21  3:12 PM  Result Value Ref Range   Cholesterol 229 (H) <200 mg/dL   HDL 58 > OR = 50 mg/dL   Triglycerides 135 <150 mg/dL   LDL Cholesterol (Calc) 145 (H) mg/dL (calc)    Comment: Reference range: <100 . Desirable range <100 mg/dL for primary prevention;   <70 mg/dL for patients with CHD or diabetic patients  with > or = 2 CHD risk factors. Marland Kitchen LDL-C is now calculated using the Martin-Hopkins  calculation, which is a validated novel method providing  better accuracy than the Friedewald equation in the  estimation of LDL-C.  Cresenciano Genre et al. Annamaria Helling. 8786;767(20): 2061-2068  (http://education.[facility].com/faq/FAQ164)    Total CHOL/HDL  Ratio 3.9 <5.0 (calc)   Non-HDL Cholesterol (Calc) 171 (H) <130 mg/dL (calc)  Comment: For patients with diabetes plus 1 major ASCVD risk  factor, treating to a non-HDL-C goal of <100 mg/dL  (LDL-C of <70 mg/dL) is considered a therapeutic  option.   Hemoglobin A1c     Status: None   Collection Time: 10/28/21  3:12 PM  Result Value Ref Range   Hgb A1c MFr Bld 5.4 <5.7 % of total Hgb    Comment: For the purpose of screening for the presence of diabetes: . <5.7%       Consistent with the absence of diabetes 5.7-6.4%    Consistent with increased risk for diabetes             (prediabetes) > or =6.5%  Consistent with diabetes . This assay result is consistent with a decreased risk of diabetes. . Currently, no consensus exists regarding use of hemoglobin A1c for diagnosis of diabetes in children. . According to American Diabetes Association (ADA) guidelines, hemoglobin A1c <7.0% represents optimal control in non-pregnant diabetic patients. Different metrics may apply to specific patient populations.  Standards of Medical Care in Diabetes(ADA). .    Mean Plasma Glucose 108 mg/dL   eAG (mmol/L) 6.0 mmol/L  Vitamin B12     Status: None   Collection Time: 10/28/21  3:12 PM  Result Value Ref Range   Vitamin B-12 416 200 - 1,100 pg/mL    Fall Risk: Fall Risk  12/03/2021 10/31/2021 05/13/2021 10/30/2020 01/13/2020  Falls in the past year? 0 0 0 0 0  Comment - - - - -  Number falls in past yr: - 0 0 0 0  Comment - - - - -  Injury with Fall? - 0 0 0 0  Comment - - - - -  Risk for fall due to : - No Fall Risks - - -  Risk for fall due to: Comment - - - - -  Follow up Falls prevention discussed Falls prevention discussed - - -     Functional Status Survey: Is the patient deaf or have difficulty hearing?: No Does the patient have difficulty seeing, even when wearing glasses/contacts?: No Does the patient have difficulty concentrating, remembering, or making decisions?: No Does  the patient have difficulty walking or climbing stairs?: No Does the patient have difficulty dressing or bathing?: No Does the patient have difficulty doing errands alone such as visiting a doctor's office or shopping?: No   Assessment & Plan  1. Well adult exam   2. Cervical cancer screening  - Cytology - PAP  3. Need for Tdap vaccination  - Tdap (ADACEL) 03-25-14.5 LF-MCG/0.5 injection; Inject 0.5 mLs into the muscle once for 1 dose.  Dispense: 0.5 mL; Refill: 0  4. Need for shingles vaccine  - Zoster Vaccine Adjuvanted [facility]) injection; Inject 0.5 mLs into the muscle once for 1 dose.  Dispense: 0.5 mL; Refill: 1  5. Osteopenia after menopause  - DG Bone Density; Future - TSH - VITAMIN D 25 Hydroxy (Vit-D Deficiency, Fractures)  6. Ovarian failure  - DG Bone Density; Future   -USPSTF grade A and B recommendations reviewed with patient; age-appropriate recommendations, preventive care, screening tests, etc discussed and encouraged; healthy living encouraged; see AVS for patient education given to patient -Discussed importance of 150 minutes of physical activity weekly, eat two servings of fish weekly, eat one serving of tree nuts ( cashews, pistachios, pecans, almonds.Marland Kitchen) every other day, eat 6 servings of fruit/vegetables daily and drink plenty

## 2021-12-03 ENCOUNTER — Ambulatory Visit (INDEPENDENT_AMBULATORY_CARE_PROVIDER_SITE_OTHER): Payer: 59 | Admitting: Family Medicine

## 2021-12-03 ENCOUNTER — Encounter: Payer: Self-pay | Admitting: Urgent Care

## 2021-12-03 ENCOUNTER — Other Ambulatory Visit (HOSPITAL_COMMUNITY)
Admission: RE | Admit: 2021-12-03 | Discharge: 2021-12-03 | Disposition: A | Discharge status: Home / Self Care | Payer: 59 | Source: Ambulatory Visit | Attending: Family Medicine | Admitting: Family Medicine

## 2021-12-03 ENCOUNTER — Other Ambulatory Visit: Payer: Self-pay

## 2021-12-03 ENCOUNTER — Other Ambulatory Visit: Payer: Self-pay | Admitting: Family Medicine

## 2021-12-03 ENCOUNTER — Encounter: Payer: Self-pay | Admitting: Family Medicine

## 2021-12-03 VITALS — BP 132/84 | HR 71 | Temp 98.3°F | Resp 16 | Ht 60.0 in | Wt 165.0 lb

## 2021-12-03 DIAGNOSIS — Z1231 Encounter for screening mammogram for malignant neoplasm of breast: Secondary | ICD-10-CM

## 2021-12-03 DIAGNOSIS — Z23 Encounter for immunization: Secondary | ICD-10-CM

## 2021-12-03 DIAGNOSIS — Z780 Asymptomatic menopausal state: Secondary | ICD-10-CM

## 2021-12-03 DIAGNOSIS — E2839 Other primary ovarian failure: Secondary | ICD-10-CM

## 2021-12-03 DIAGNOSIS — Z0000 Encounter for general adult medical examination without abnormal findings: Secondary | ICD-10-CM

## 2021-12-03 DIAGNOSIS — Z124 Encounter for screening for malignant neoplasm of cervix: Secondary | ICD-10-CM | POA: Insufficient documentation

## 2021-12-03 DIAGNOSIS — M8580 Other specified disorders of bone density and structure, unspecified site: Secondary | ICD-10-CM | POA: Diagnosis not present

## 2021-12-03 LAB — TSH: TSH: 4.43 mIU/L (ref 0.40–4.50)

## 2021-12-03 LAB — VITAMIN D 25 HYDROXY (VIT D DEFICIENCY, FRACTURES): Vit D, 25-Hydroxy: 26 ng/mL — ABNORMAL LOW (ref 30–100)

## 2021-12-03 MED ORDER — SHINGRIX 50 MCG/0.5ML IM SUSR: 0.5000 mL | Freq: Once | INTRAMUSCULAR | 1 refills | Status: AC

## 2021-12-03 MED ORDER — TETANUS-DIPHTH-ACELL PERTUSSIS 5-2-15.5 LF-MCG/0.5 IM SUSP: 0.5000 mL | Freq: Once | INTRAMUSCULAR | 0 refills | Status: AC

## 2021-12-03 NOTE — Progress Notes (Unsigned)
Name: Deanna Baker   MRN: [id number]    DOB: 02-27-1961   Date:12/03/2021       Progress Note  Subjective  Chief Complaint  No chief complaint on file.   HPI  Patient presents for annual CPE.  Diet: *** Exercise: ***   Flowsheet Row Office Visit from 10/31/2021 in [facility]  AUDIT-C Score 1      Depression: Phq 9 is  {Desc; negative/positive:13464} Depression screen [facility] 2/9 10/31/2021 05/13/2021 10/30/2020 01/13/2020 10/12/2019  Decreased Interest 0 1 0 0 0  Down, Depressed, Hopeless 0 0 0 1 0  PHQ - 2 Score 0 1 0 1 0  Altered sleeping 0 0 0 0 0  Tired, decreased energy 0 _0 Change in appetite 0 0 0 2 0  Feeling bad or failure about yourself  0 0 1 1 0  Trouble concentrating 0 0 1 0 1  Moving slowly or fidgety/restless 0 0 1 0 0  Suicidal thoughts 0 0 0 0 0  PHQ-9 Score 0 _1 Difficult doing work/chores Not difficult at all - - Somewhat difficult -  Some recent data might be hidden   Hypertension: BP Readings from Last 3 Encounters:  10/31/21 (!) 164/82  05/13/21 (!) 148/84  10/30/20 (!) 142/84   Obesity: Wt Readings from Last 3 Encounters:  10/31/21 165 lb 14.4 oz (75.3 kg)  05/13/21 159 lb (72.1 kg)  10/30/20 164 lb 3.2 oz (74.5 kg)   BMI Readings from Last 3 Encounters:  10/31/21 32.40 kg/m  05/13/21 31.05 kg/m  10/30/20 32.07 kg/m     Vaccines:  HPV: up to at age 62 , ask insurance if age between 31-45  Shingrix: 62-64 yo and ask insurance if covered when patient above 34 yo Pneumonia: *** educated and discussed with patient. Flu: *** educated and discussed with patient.  Hep C Screening: *** STD testing and prevention (HIV/chl/gon/syphilis): *** Intimate partner violence:*** Sexual History : Menstrual History/LMP/Abnormal Bleeding:  Incontinence Symptoms:   Breast cancer:  - Last Mammogram: *** - BRCA gene screening: ***  Osteoporosis: Discussed high calcium and vitamin D supplementation, weight bearing  exercises  Cervical cancer screening: ***  Skin cancer: Discussed monitoring for atypical lesions  Colorectal cancer: ***   Lung cancer:  *** Low Dose CT Chest recommended if Age 81-80 years, 20 pack-year currently smoking OR have quit w/in 15years. Patient {DOES NOT does:27190::"does not"} qualify.   ECG: ***  Advanced Care Planning: A voluntary discussion about advance care planning including the explanation and discussion of advance directives.  Discussed health care proxy and Living will, and the patient was able to identify a health care proxy as ***.  Patient {DOES_DOES ITG:54982} have a living will at present time. If patient does have living will, I have requested they bring this to the clinic to be scanned in to their chart.  Lipids: Lab Results  Component Value Date   CHOL 229 (H) 10/28/2021   CHOL 248 (H) 10/22/2020   CHOL 252 (H) 09/26/2019   Lab Results  Component Value Date   HDL 58 10/28/2021   HDL 59 10/22/2020   HDL 56 09/26/2019   Lab Results  Component Value Date   LDLCALC 145 (H) 10/28/2021   LDLCALC 165 (H) 10/22/2020   LDLCALC 164 (H) 09/26/2019   Lab Results  Component Value Date   TRIG 135 10/28/2021   TRIG 118 10/22/2020   TRIG 167 (H) 09/26/2019  Lab Results  Component Value Date   CHOLHDL 3.9 10/28/2021   CHOLHDL 4.2 10/22/2020   CHOLHDL 4.5 09/26/2019   No results found for: LDLDIRECT  Glucose: Glucose  Date Value Ref Range Status  02/20/2015 113 (H) mg/dL Final    Comment:    65-99 NOTE: New Reference Range  01/30/15   08/22/2014 94 65 - 99 mg/dL Final  02/14/2014 117 (H) 65 - 99 mg/dL Final   Glucose, Bld  Date Value Ref Range Status  10/28/2021 101 (H) 65 - 99 mg/dL Final    Comment:    .            Fasting reference interval . For someone without known diabetes, a glucose value between 100 and 125 mg/dL is consistent with prediabetes and should be confirmed with a follow-up test. .   10/22/2020 107 (H) 65 - 99 mg/dL  Final    Comment:    .            Fasting reference interval . For someone without known diabetes, a glucose value between 100 and 125 mg/dL is consistent with prediabetes and should be confirmed with a follow-up test. .   09/26/2019 113 (H) 65 - 99 mg/dL Final    Comment:    .            Fasting reference interval . For someone without known diabetes, a glucose value between 100 and 125 mg/dL is consistent with prediabetes and should be confirmed with a follow-up test. .    Glucose-Capillary  Date Value Ref Range Status  08/16/2015 124 (H) 65 - 99 mg/dL Final    Patient Active Problem List   Diagnosis Date Noted   Gastroesophageal reflux disease    B12 deficiency 03/29/2018   Cognitive decline 01/02/2017   Sleep apnea 07/11/2016   Smell or taste sensation disturbance 05/12/2016   Personal history of colonic polyps    Benign neoplasm of transverse colon    Rectal polyp    Tenosynovitis of thumb 03/12/2016   Bilateral knee pain 03/12/2016   Reflux esophagitis    Stricture and stenosis of esophagus    Thickening of esophagus 08/03/2015   Benign essential HTN 05/01/2015   CFIDS (chronic fatigue and immune dysfunction syndrome) ([place]) 05/01/2015   Major depression, recurrent, chronic ([place]) 05/01/2015   Dyslipidemia 05/01/2015   Fibromyalgia syndrome 05/01/2015   Blood glucose elevated 05/01/2015   Eczema intertrigo 05/01/2015   Migraine without aura and without status migrainosus, not intractable 05/01/2015   Excessive urination at night 53/74/8270   Dysmetabolic syndrome 78/67/5449   History of colonoscopy with polypectomy 05/01/2015   Central sleep apnea 05/01/2015   History of back surgery 05/01/2015   Thickened nails 05/01/2015    Past Surgical History:  Procedure Laterality Date   BACK SURGERY     BREAST BIOPSY Left 10/2012   +   BREAST EXCISIONAL BIOPSY Left 11/2012   + 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   COLONOSCOPY  2013   Dr. Suzette Battiest    COLONOSCOPY WITH PROPOFOL N/A 04/28/2016   Procedure: COLONOSCOPY WITH PROPOFOL;  Surgeon: Lucilla Lame, MD;  Location: [place];  Service: Endoscopy;  Laterality: N/A;  CPAP   COLONOSCOPY WITH PROPOFOL N/A 04/10/2020   Procedure: COLONOSCOPY WITH PROPOFOL;  Surgeon: Lucilla Lame, MD;  Location: [facility] ENDOSCOPY;  Service: Endoscopy;  Laterality: N/A;   ESOPHAGOGASTRODUODENOSCOPY (EGD) WITH  PROPOFOL N/A 10/23/2015   Procedure: ESOPHAGOGASTRODUODENOSCOPY (EGD) WITH PROPOFOL;  Surgeon: Lucilla Lame, MD;  Location: [facility] ENDOSCOPY;  Service: Endoscopy;  Laterality: N/A;   ESOPHAGOGASTRODUODENOSCOPY (EGD) WITH PROPOFOL N/A 04/10/2020   Procedure: ESOPHAGOGASTRODUODENOSCOPY (EGD) WITH PROPOFOL;  Surgeon: Lucilla Lame, MD;  [facility] ENDOSCOPY;  Service: Endoscopy;  Laterality: N/A;   POLYPECTOMY  04/28/2016   Procedure: POLYPECTOMY;  Surgeon: Lucilla Lame, MD;  Location: [place];  Service: Endoscopy;;   SHOULDER SURGERY Left 2009   TONSILLECTOMY  1965   TUBAL LIGATION  1982   UPPER GI ENDOSCOPY     Dr. Suzette Battiest    Family History  Problem Relation Age of Onset   Breast cancer Mother 104   Diabetes Mother    Gout Mother    Kidney failure Mother    Hypertension Mother    Congestive Heart Failure Mother    Stomach cancer Maternal Aunt    Brain cancer Paternal Uncle        x 2 mat uncles   Heart attack Maternal Grandmother    Hypertension Maternal Grandmother    Stroke Maternal Grandmother    Epilepsy Maternal Aunt     Social History   Socioeconomic History   Marital status: Married    Spouse name: Christia Reading   Number of children: 1   Years of education: Not on file   Highest education level: GED or equivalent  Occupational History   Occupation: Unemployed  Tobacco Use   Smoking status: Former    Packs/day: 0.50    Years: 4.00    Pack years: 2.00    Types: Cigarettes    Start date:  11/24/1976    Quit date: 11/24/1980    Years since quitting: 41.0   Smokeless tobacco: Never  Vaping Use   Vaping Use: Never used  Substance and Sexual Activity   Alcohol use: Yes    Alcohol/week: 0.0 standard drinks    Comment: rarely, 1-2x/mo glass of wine   Drug use: No   Sexual activity: Yes    Partners: Male    Birth control/protection: Post-menopausal  Other Topics Concern   Not on file  Social History Narrative   Not on file   Social Determinants of Health   Financial Resource Strain: Low Risk    Difficulty of Paying Living Expenses: Not hard at all  Food Insecurity: No Food Insecurity   Worried About Charity fundraiser in the Last Year: Never true   [place] in the Last Year: Never true  Transportation Needs: No Transportation Needs   Lack of Transportation (Medical): No   Lack of Transportation (Non-Medical): No  Physical Activity: Sufficiently Active   Days of Exercise per Week: 6 days   Minutes of Exercise per Session: 40 min  Stress: No Stress Concern Present   Feeling of Stress : Not at all  Social Connections: Socially Integrated   Frequency of Communication with Friends and Family: Twice a week   Frequency of Social Gatherings with Friends and Family: Twice a week   Attends Religious Services: 1 to 4 times per year   Active Member of [employer] or Organizations: Yes   Attends Music therapist: More than 4 times per year   Marital Status: Married  Human resources officer Violence: Not At Risk   Fear of Current or Ex-Partner: No   Emotionally Abused: No   Physically Abused: No   Sexually Abused: No     Current Outpatient Medications:    amLODipine (  NORVASC) 2.5 MG tablet, TAKE 1 TABLET BY MOUTH EVERY DAY, Disp: 30 tablet, Rfl: 0   [employer] B-12 500 MCG tablet, 500 mcg daily., Disp: , Rfl:    Cyanocobalamin (VITAMIN B-12) 500 MCG SUBL, Place 1 tablet (500 mcg total) under the tongue daily at 12 noon., Disp: 100 tablet, Rfl: 1   ibuprofen (ADVIL) 400 MG  tablet, Take 400 mg by mouth every 6 (six) hours as needed., Disp: , Rfl:    Nerve Stimulator (STANDARD TENS) DEVI, 1 Units by Does not apply route daily., Disp: 1 Device, Rfl: 0  Allergies  Allergen Reactions   Ace Inhibitors Swelling   Benicar [Olmesartan] Other (See Comments)    Chest Pressure and Allergic Reaction   Pregabalin     weight gain and blurred vision   Spironolactone     Chest pain    Venlafaxine     foggy minded     ROS  ***  Objective  There were no vitals filed for this visit.  There is no height or weight on file to calculate BMI.  Physical Exam ***  Recent Results (from the past 2160 hour(s))  CBC with Differential/Platelet     Status: None   Collection Time: 10/28/21  3:12 PM  Result Value Ref Range   WBC 6.3 3.8 - 10.8 Thousand/uL   RBC 4.37 3.80 - 5.10 Million/uL   Hemoglobin 12.8 11.7 - 15.5 g/dL   HCT 38.6 35.0 - 45.0 %   MCV 88.3 80.0 - 100.0 fL   MCH 29.3 27.0 - 33.0 pg   MCHC 33.2 32.0 - 36.0 g/dL   RDW 12.7 11.0 - 15.0 %   Platelets 227 140 - 400 Thousand/uL   MPV 10.8 7.5 - 12.5 fL   Neutro Abs 3,969 1,500 - 7,800 cells/uL   Lymphs Abs 1,733 850 - 3,900 cells/uL   Absolute Monocytes 416 200 - 950 cells/uL   Eosinophils Absolute 164 15 - 500 cells/uL   Basophils Absolute 19 0 - 200 cells/uL   Neutrophils Relative % 63 %   Total Lymphocyte 27.5 %   Monocytes Relative 6.6 %   Eosinophils Relative 2.6 %   Basophils Relative 0.3 %  COMPLETE METABOLIC PANEL WITH GFR     Status: Abnormal   Collection Time: 10/28/21  3:12 PM  Result Value Ref Range   Glucose, Bld 101 (H) 65 - 99 mg/dL    Comment: .            Fasting reference interval . For someone without known diabetes, a glucose value between 100 and 125 mg/dL is consistent with prediabetes and should be confirmed with a follow-up test. .    BUN 16 7 - 25 mg/dL   Creat 0.95 0.50 - 1.05 mg/dL   eGFR 68 > OR = 60 mL/min/1.65m    Comment: The eGFR is based on the CKD-EPI  2021 equation. To calculate  the new eGFR from a previous Creatinine or Cystatin C result, go to https://www.kidney.org/professionals/ kdoqi/gfr%5Fcalculator    BUN/Creatinine Ratio NOT APPLICABLE 6 - 22 (calc)   Sodium 140 135 - 146 mmol/L   Potassium 3.8 3.5 - 5.3 mmol/L   Chloride 103 98 - 110 mmol/L   CO2 28 20 - 32 mmol/L   Calcium 9.8 8.6 - 10.4 mg/dL   Total Protein 6.3 6.1 - 8.1 g/dL   Albumin 4.1 3.6 - 5.1 g/dL   Globulin 2.2 1.9 - 3.7 g/dL (calc)   AG Ratio 1.9 1.0 - 2.5 (  calc)   Total Bilirubin 0.5 0.2 - 1.2 mg/dL   Alkaline phosphatase (APISO) 88 37 - 153 U/L   AST 19 10 - 35 U/L   ALT 12 6 - 29 U/L  Lipid panel     Status: Abnormal   Collection Time: 10/28/21  3:12 PM  Result Value Ref Range   Cholesterol 229 (H) <200 mg/dL   HDL 58 > OR = 50 mg/dL   Triglycerides 135 <150 mg/dL   LDL Cholesterol (Calc) 145 (H) mg/dL (calc)    Comment: Reference range: <100 . Desirable range <100 mg/dL for primary prevention;   <70 mg/dL for patients with CHD or diabetic patients  with > or = 2 CHD risk factors. Marland Kitchen LDL-C is now calculated using the Martin-Hopkins  calculation, which is a validated novel method providing  better accuracy than the Friedewald equation in the  estimation of LDL-C.  Cresenciano Genre et al. Annamaria Helling. 1610;960(45): 2061-2068  (http://education.[facility].com/faq/FAQ164)    Total CHOL/HDL Ratio 3.9 <5.0 (calc)   Non-HDL Cholesterol (Calc) 171 (H) <130 mg/dL (calc)    Comment: For patients with diabetes plus 1 major ASCVD risk  factor, treating to a non-HDL-C goal of <100 mg/dL  (LDL-C of <70 mg/dL) is considered a therapeutic  option.   Hemoglobin A1c     Status: None   Collection Time: 10/28/21  3:12 PM  Result Value Ref Range   Hgb A1c MFr Bld 5.4 <5.7 % of total Hgb    Comment: For the purpose of screening for the presence of diabetes: . <5.7%       Consistent with the absence of diabetes 5.7-6.4%    Consistent with increased risk for  diabetes             (prediabetes) > or =6.5%  Consistent with diabetes . This assay result is consistent with a decreased risk of diabetes. . Currently, no consensus exists regarding use of hemoglobin A1c for diagnosis of diabetes in children. . According to American Diabetes Association (ADA) guidelines, hemoglobin A1c <7.0% represents optimal control in non-pregnant diabetic patients. Different metrics may apply to specific patient populations.  Standards of Medical Care in Diabetes(ADA). .    Mean Plasma Glucose 108 mg/dL   eAG (mmol/L) 6.0 mmol/L  Vitamin B12     Status: None   Collection Time: 10/28/21  3:12 PM  Result Value Ref Range   Vitamin B-12 416 200 - 1,100 pg/mL    Diabetic Foot Exam: Diabetic Foot Exam - Simple   No data filed    ***  Fall Risk: Fall Risk  10/31/2021 05/13/2021 10/30/2020 01/13/2020 10/12/2019  Falls in the past year? 0 0 0 0 1  Comment - - - - -  Number falls in past yr: 0 0 0 0 0  Comment - - - - Walking her dog after it rained  Injury with Fall? 0 0 0 0 1  Comment - - - - Contusion on her left knee  Risk for fall due to : No Fall Risks - - - -  Risk for fall due to: Comment - - - - -  Follow up Falls prevention discussed - - - -   ***  Functional Status Survey:   ***  Assessment & Plan  There are no diagnoses linked to this encounter.  -USPSTF grade A and B recommendations reviewed with patient; age-appropriate recommendations, preventive care, screening tests, etc discussed and encouraged; healthy living encouraged; see AVS for patient education given to patient -  Discussed importance of 150 minutes of physical activity weekly, eat two servings of fish weekly, eat one serving of tree nuts ( cashews, pistachios, pecans, almonds.Marland Kitchen) every other day, eat 6 servings of fruit/vegetables daily and drink plenty of water and avoid sweet beverages.   -Reviewed Health Maintenance: ***

## 2021-12-05 LAB — CYTOLOGY - PAP
Comment: NEGATIVE
Diagnosis: NEGATIVE
High risk HPV: NEGATIVE

## 2021-12-06 ENCOUNTER — Encounter: Payer: Self-pay | Admitting: Urgent Care

## 2021-12-20 ENCOUNTER — Other Ambulatory Visit: Payer: Self-pay | Admitting: Internal Medicine

## 2021-12-20 DIAGNOSIS — I10 Essential (primary) hypertension: Secondary | ICD-10-CM

## 2021-12-20 NOTE — Telephone Encounter (Signed)
Requested Prescriptions  Pending Prescriptions Disp Refills   amLODipine (NORVASC) 2.5 MG tablet [Pharmacy Med Name: AMLODIPINE BESYLATE 2.5 MG TAB] 30 tablet 0    Sig: TAKE 1 TABLET BY MOUTH EVERY DAY     Cardiovascular:  Calcium Channel Blockers Passed - 12/20/2021 12:31 PM      Passed - Last BP in normal range    BP Readings from Last 1 Encounters:  12/03/21 132/84         Passed - Valid encounter within last 6 months    Recent Outpatient Visits          2 weeks ago Well adult exam   [facility] Steele Sizer, MD   1 month ago Dyslipidemia   [facility] Steele Sizer, MD   7 months ago Benign essential HTN   [facility] Steele Sizer, MD   1 year ago Benign essential HTN   [facility] Steele Sizer, MD   1 year ago Seasonal affective disorder [facility])   [facility] Steele Sizer, MD      Future Appointments            In 1 month Ancil Boozer, Drue Stager, MD [facility] [place]   In 11 months Steele Sizer, MD [facility]

## 2022-01-06 ENCOUNTER — Other Ambulatory Visit: Payer: Self-pay

## 2022-01-06 ENCOUNTER — Ambulatory Visit
Admission: RE | Admit: 2022-01-06 | Discharge: 2022-01-06 | Disposition: A | Discharge status: Home / Self Care | Payer: 59 | Source: Ambulatory Visit | Attending: Family Medicine | Admitting: Family Medicine

## 2022-01-06 DIAGNOSIS — Z1231 Encounter for screening mammogram for malignant neoplasm of breast: Secondary | ICD-10-CM | POA: Insufficient documentation

## 2022-01-28 NOTE — Progress Notes (Signed)
Name: Deanna Baker   MRN: [id number]    DOB: 10-12-1961   Date:01/29/2022       Progress Note  Subjective  Chief Complaint  Follow Up  HPI  GERD: she had daily symptoms, she stopped PPI on her own because of low B12  she had EGD and colonoscopy 2021 and showed mild hiatal hernia. She is doing well at this time , but states cannot wear CPAP because it causes burning inside anterior chest/esophagus.    HTN: bp was elevated last visit and we switched from HCTZ 12.5 mg to Aldactizide to take half daily, she tried it for 3 days but stopped because it caused chest pain. Prior to last visit bp at home was very high but did not match our readings when she brought her machine. We started her on Norvasc 2.5 mg and bp today is at goal  Seasonal Affective disorder: phq 9 is  negative today  She has seasonal affective disorder but did not want to take medications during winter months, she is feeling more energized now that the days are longer.. She is walking with her dog for at least 30 minutes daily      FMS: she is only taking Gabapentin and ibuprofen prn now, less than once a week on average. . She states she has pain all over pain at this time is 3/10, but still has constant fatigue and mental fogginess. She has chronic fatigue syndrome she states sometimes gets cervical lymphadenopathy .    Vasomotor rhinitis she went  to Dr. Tami Ribas for recurrent clear rhinorrhea and vomiting when showering, had a CT sinus that per patient was negative ( unable to see it) she was given nasal spray but not using it. She still gags when she brushes her teeth and occasionally vomits when she takes a hot shower and causes sinus drainage. She was evaluated by GI and ENT, symptoms are stable. She also told me today that when she gags in the shower she gets a cough and it shoots and electric sensation all over her body ( explained I don't know the reason for that) she states those symptoms also stable and just wanted to let me  know about it    Dyslipidemia: low risk of heart attack and strokes in the next 10 years and not interested in statin therapy . Discussed risk below, still elevated even with better bp control.   The 10-year ASCVD risk score (Arnett DK, et al., 2019) is: 11.2%   Values used to calculate the score:     Age: 61 years     Sex: Female     Is Non-Hispanic African American: No     Diabetic: No     Tobacco smoker: Yes     Systolic Blood Pressure: 903 mmHg     Is BP treated: Yes     HDL Cholesterol: 58 mg/dL     Total Cholesterol: 229 mg/dL   B12 deficiency: advised to resume supplementation at 500 mcg daily SL and denies side effects   Vitamin D deficiency: she is now taking 1000 mcg daily .    Patient Active Problem List   Diagnosis Date Noted   Osteopenia after menopause 12/03/2021   Gastroesophageal reflux disease    B12 deficiency 03/29/2018   Cognitive decline 01/02/2017   Sleep apnea 07/11/2016   Smell or taste sensation disturbance 05/12/2016   Personal history of colonic polyps    Benign neoplasm of transverse colon    Rectal polyp  Tenosynovitis of thumb 03/12/2016   Bilateral knee pain 03/12/2016   Reflux esophagitis    Stricture and stenosis of esophagus    Thickening of esophagus 08/03/2015   Benign essential HTN 05/01/2015   CFIDS (chronic fatigue and immune dysfunction syndrome) ([place]) 05/01/2015   Major depression, recurrent, chronic (HCC) 05/01/2015   Dyslipidemia 05/01/2015   Fibromyalgia syndrome 05/01/2015   Blood glucose elevated 05/01/2015   Eczema intertrigo 05/01/2015   Migraine without aura and without status migrainosus, not intractable 05/01/2015   Excessive urination at night 05/19/9484   Dysmetabolic syndrome 46/27/0350   History of colonoscopy with polypectomy 05/01/2015   Central sleep apnea 05/01/2015   History of back surgery 05/01/2015   Thickened nails 05/01/2015    Past Surgical History:  Procedure Laterality Date   BACK SURGERY      BREAST BIOPSY Left 10/2012   +   BREAST EXCISIONAL BIOPSY Left 11/2012   + 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   COLONOSCOPY  2013   Dr. Suzette Battiest    COLONOSCOPY WITH PROPOFOL N/A 04/28/2016   Procedure: COLONOSCOPY WITH PROPOFOL;  Surgeon: Lucilla Lame, MD;  Location: [place];  Service: Endoscopy;  Laterality: N/A;  CPAP   COLONOSCOPY WITH PROPOFOL N/A 04/10/2020   Procedure: COLONOSCOPY WITH PROPOFOL;  Surgeon: Lucilla Lame, MD;  Location: [facility] ENDOSCOPY;  Service: Endoscopy;  Laterality: N/A;   ESOPHAGOGASTRODUODENOSCOPY (EGD) WITH PROPOFOL N/A 10/23/2015   Procedure: ESOPHAGOGASTRODUODENOSCOPY (EGD) WITH PROPOFOL;  Surgeon: Lucilla Lame, MD;  Location: [facility] ENDOSCOPY;  Service: Endoscopy;  Laterality: N/A;   ESOPHAGOGASTRODUODENOSCOPY (EGD) WITH PROPOFOL N/A 04/10/2020   Procedure: ESOPHAGOGASTRODUODENOSCOPY (EGD) WITH PROPOFOL;  Surgeon: Lucilla Lame, MD;  Location[facility] ENDOSCOPY;  Service: Endoscopy;  Laterality: N/A;   POLYPECTOMY  04/28/2016   Procedure: POLYPECTOMY;  Surgeon: Lucilla Lame, MD;  Location: [place];  Service: Endoscopy;;   SHOULDER SURGERY Left 2009   TONSILLECTOMY  1965   TUBAL LIGATION  1982   UPPER GI ENDOSCOPY     Dr. Suzette Battiest    Family History  Problem Relation Age of Onset   Breast cancer Mother 3   Diabetes Mother    Gout Mother    Kidney failure Mother    Hypertension Mother    Congestive Heart Failure Mother    Stomach cancer Maternal Aunt    Brain cancer Paternal Uncle        x 2 mat uncles   Heart attack Maternal Grandmother    Hypertension Maternal Grandmother    Stroke Maternal Grandmother    Epilepsy Maternal Aunt     Social History   Tobacco Use   Smoking status: Former    Packs/day: 0.50    Years: 4.00    Pack years: 2.00    Types: Cigarettes    Start date: 11/24/1976    Quit date: 11/24/1980    Years since quitting: 41.2   Smokeless  tobacco: Never  Substance Use Topics   Alcohol use: Yes    Alcohol/week: 0.0 standard drinks    Comment: rarely, 1-2x/mo glass of wine     Current Outpatient Medications:    amLODipine (NORVASC) 2.5 MG tablet, TAKE 1 TABLET BY MOUTH EVERY DAY, Disp: 30 tablet, Rfl: 2   [employer] B-12 500 MCG tablet, 500 mcg daily., Disp: , Rfl:    Cyanocobalamin (VITAMIN B-12) 500 MCG SUBL, Place 1 tablet (500 mcg total) under the tongue daily at 12  noon., Disp: 100 tablet, Rfl: 1   ibuprofen (ADVIL) 400 MG tablet, Take 400 mg by mouth every 6 (six) hours as needed., Disp: , Rfl:    Nerve Stimulator (STANDARD TENS) DEVI, 1 Units by Does not apply route daily., Disp: 1 Device, Rfl: 0  Allergies  Allergen Reactions   Ace Inhibitors Swelling   Benicar [Olmesartan] Other (See Comments)    Chest Pressure and Allergic Reaction   Pregabalin     weight gain and blurred vision   Spironolactone     Chest pain    Venlafaxine     foggy minded    I personally reviewed active problem list, medication list, allergies, family history, social history, health maintenance with the patient/caregiver today.   ROS  Constitutional: Negative for fever or weight change.  Respiratory: Negative for cough and shortness of breath.   Cardiovascular: Negative for chest pain or palpitations.  Gastrointestinal: Negative for abdominal pain, no bowel changes.  Musculoskeletal: Negative for gait problem or joint swelling.  Skin: Negative for rash.  Neurological: Negative for dizziness or headache.  No other specific complaints in a complete review of systems (except as listed in HPI above).   Objective  Vitals:   01/29/22 1307  BP: 132/82  Pulse: 87  Resp: 16  Temp: 98.6 F (37 C)  SpO2: 98%  Weight: 165 lb 6.4 oz (75 kg)  Height: 5' (1.524 m)    Body mass index is 32.3 kg/m.  Physical Exam  Constitutional: Patient appears well-developed and well-nourished. Obese  No distress.  HEENT: head atraumatic,  normocephalic, pupils equal and reactive to light, neck supple, Cardiovascular: Normal rate, regular rhythm and normal heart sounds.  No murmur heard. No BLE edema. Pulmonary/Chest: Effort normal and breath sounds normal. No respiratory distress. Abdominal: Soft.  There is no tenderness. Psychiatric: Patient has a normal mood and affect. behavior is normal. Judgment and thought content normal.   Recent Results (from the past 2160 hour(s))  Cytology - PAP     Status: None   Collection Time: 12/03/21  2:34 PM  Result Value Ref Range   High risk HPV Negative    Adequacy      Satisfactory for evaluation; transformation zone component PRESENT.   Diagnosis      - Negative for intraepithelial lesion or malignancy (NILM)   Comment Normal Reference Range HPV - Negative   TSH     Status: None   Collection Time: 12/03/21  3:01 PM  Result Value Ref Range   TSH 4.43 0.40 - 4.50 mIU/L  VITAMIN D 25 Hydroxy (Vit-D Deficiency, Fractures)     Status: Abnormal   Collection Time: 12/03/21  3:01 PM  Result Value Ref Range   Vit D, 25-Hydroxy 26 (L) 30 - 100 ng/mL    Comment: Vitamin D Status         25-OH Vitamin D: . Deficiency:                    <20 ng/mL Insufficiency:             20 - 29 ng/mL Optimal:                 > or = 30 ng/mL . For 25-OH Vitamin D testing on patients on  D2-supplementation and patients for whom quantitation  of D2 and D3 fractions is required, the [employer]ure[employer](TM) 25-OH VIT D, (D2,D3), LC/MS/MS is recommended: order  code 740 009 4039 (patients >70yr). See Note 1 . Note 1 .  For additional information, please refer to  http://education.Quest[facility].com/faq/FAQ199  (This link is being provided for informational/ educational purposes only.)     PHQ2/9: Depression screen [facility] 2/9 01/29/2022 12/03/2021 10/31/2021 05/13/2021 10/30/2020  Decreased Interest 0 2 0 1 0  Down, Depressed, Hopeless 0 2 0 0 0  PHQ - 2 Score 0 4 0 1 0  Altered sleeping 0 0 0 0 0  Tired, decreased  energy 0 2 0 3 2  Change in appetite 0 0 0 0 0  Feeling bad or failure about yourself  0 1 0 0 1  Trouble concentrating 0 1 0 0 1  Moving slowly or fidgety/restless 0 1 0 0 1  Suicidal thoughts 0 0 0 0 0  PHQ-9 Score 0 9 0 4 5  Difficult doing work/chores Not difficult at all Somewhat difficult Not difficult at all - -  Some recent data might be hidden    phq 9 is negative   Fall Risk: Fall Risk  01/29/2022 12/03/2021 10/31/2021 05/13/2021 10/30/2020  Falls in the past year? 0 0 0 0 0  Comment - - - - -  Number falls in past yr: 0 - 0 0 0  Comment - - - - -  Injury with Fall? 0 - 0 0 0  Comment - - - - -  Risk for fall due to : - - No Fall Risks - -  Risk for fall due to: Comment - - - - -  Follow up - Falls prevention discussed Falls prevention discussed - -      Functional Status Survey: Is the patient deaf or have difficulty hearing?: No Does the patient have difficulty seeing, even when wearing glasses/contacts?: No Does the patient have difficulty concentrating, remembering, or making decisions?: Yes Does the patient have difficulty walking or climbing stairs?: No Does the patient have difficulty dressing or bathing?: No Does the patient have difficulty doing errands alone such as visiting a doctor's office or shopping?: No    Assessment & Plan  1. Benign essential HTN  - amLODipine (NORVASC) 2.5 MG tablet; Take 1 tablet (2.5 mg total) by mouth daily.  Dispense: 90 tablet; Refill: 1  2. Seasonal affective disorder ([place])  Doing better now   3. CFIDS (chronic fatigue and immune dysfunction syndrome) (HCC)   4. Dyslipidemia  Refuses statin therapy   5. Fibromyalgia syndrome   6. Obstructive sleep apnea syndrome  Not wearing CPAP  7. B12 deficiency  Continue supplementation   8. Gastroesophageal reflux disease without esophagitis   Stable at this time

## 2022-01-29 ENCOUNTER — Ambulatory Visit (INDEPENDENT_AMBULATORY_CARE_PROVIDER_SITE_OTHER): Payer: 59 | Admitting: Family Medicine

## 2022-01-29 ENCOUNTER — Encounter: Payer: Self-pay | Admitting: Family Medicine

## 2022-01-29 ENCOUNTER — Other Ambulatory Visit: Payer: Self-pay

## 2022-01-29 VITALS — BP 132/82 | HR 87 | Temp 98.6°F | Resp 16 | Ht 60.0 in | Wt 165.4 lb

## 2022-01-29 DIAGNOSIS — D8989 Other specified disorders involving the immune mechanism, not elsewhere classified: Secondary | ICD-10-CM

## 2022-01-29 DIAGNOSIS — G9332 Myalgic encephalomyelitis/chronic fatigue syndrome: Secondary | ICD-10-CM

## 2022-01-29 DIAGNOSIS — E785 Hyperlipidemia, unspecified: Secondary | ICD-10-CM

## 2022-01-29 DIAGNOSIS — E538 Deficiency of other specified B group vitamins: Secondary | ICD-10-CM

## 2022-01-29 DIAGNOSIS — I10 Essential (primary) hypertension: Secondary | ICD-10-CM

## 2022-01-29 DIAGNOSIS — F338 Other recurrent depressive disorders: Secondary | ICD-10-CM | POA: Diagnosis not present

## 2022-01-29 DIAGNOSIS — M797 Fibromyalgia: Secondary | ICD-10-CM

## 2022-01-29 DIAGNOSIS — G4733 Obstructive sleep apnea (adult) (pediatric): Secondary | ICD-10-CM

## 2022-01-29 DIAGNOSIS — K219 Gastro-esophageal reflux disease without esophagitis: Secondary | ICD-10-CM

## 2022-01-29 MED ORDER — AMLODIPINE BESYLATE 2.5 MG PO TABS: 2.5000 mg | ORAL_TABLET | Freq: Every day | ORAL | 1 refills | Status: DC

## 2022-01-30 ENCOUNTER — Telehealth (INDEPENDENT_AMBULATORY_CARE_PROVIDER_SITE_OTHER): Payer: 59 | Admitting: Internal Medicine

## 2022-01-30 ENCOUNTER — Ambulatory Visit: Payer: Self-pay

## 2022-01-30 ENCOUNTER — Encounter: Payer: Self-pay | Admitting: Internal Medicine

## 2022-01-30 VITALS — Temp 101.4°F | Ht 60.0 in

## 2022-01-30 DIAGNOSIS — R112 Nausea with vomiting, unspecified: Secondary | ICD-10-CM

## 2022-01-30 DIAGNOSIS — B349 Viral infection, unspecified: Secondary | ICD-10-CM

## 2022-01-30 MED ORDER — ONDANSETRON 8 MG PO TBDP: 8.0000 mg | ORAL_TABLET | Freq: Three times a day (TID) | ORAL | 0 refills | Status: DC | PRN

## 2022-01-30 NOTE — Telephone Encounter (Signed)
? ? ?  Chief Complaint: Vomiting, fever - 101.4. ?Symptoms: Chills ?Frequency: Started last night ?Pertinent Negatives: Patient denies  ?Disposition: '[]'$ ED /'[]'$ Urgent Care (no appt availability in office) / '[]'$ Appointment(In office/virtual)/ '[]'$  [place] [facility]/ '[]'$ Home Care/ '[]'$ Refused Recommended Disposition /'[]'$ [place] [facility]/ '[]'$  Follow-up with PCP ?Additional Notes: Pt. Asking for something for nausea and vomiting. Taking Tylenol for her fever - wants to know if that is ok. Please advise pt. ?Answer Assessment - Initial Assessment Questions ?1. VOMITING SEVERITY: "How many times have you vomited in the past 24 hours?"  ?   - MILD:  1 - 2 times/day ?   - MODERATE: 3 - 5 times/day, decreased oral intake without significant weight loss or symptoms of dehydration ?   - SEVERE: 6 or more times/day, vomits everything or nearly everything, with significant weight loss, symptoms of dehydration  ?    6 ?2. ONSET: "When did the vomiting begin?"  ?    Last night ?3. FLUIDS: "What fluids or food have you vomited up today?" "Have you been able to keep any fluids down?" ?    No ?4. ABDOMINAL PAIN: "Are your having any abdominal pain?" If yes : "How bad is it and what does it feel like?" (e.g., crampy, dull, intermittent, constant)  ?    3/10 ?5. DIARRHEA: "Is there any diarrhea?" If Yes, ask: "How many times today?"  ?    No ?6. CONTACTS: "Is there anyone else in the family with the same symptoms?"  ?    Husband ?7. CAUSE: "What do you think is causing your vomiting?" ?    Unsure ?8. HYDRATION STATUS: "Any signs of dehydration?" (e.g., dry mouth [not only dry lips], too weak to stand) "When did you last urinate?" ?    No ?9. OTHER SYMPTOMS: "Do you have any other symptoms?" (e.g., fever, headache, vertigo, vomiting blood or coffee grounds, recent head injury) ?    Fever ?10. PREGNANCY: "Is there any chance you are pregnant?" "When was your last menstrual period?" ?      No ? ?Protocols used: Vomiting-A-AH ? ?

## 2022-01-30 NOTE — Progress Notes (Signed)
Virtual Visit via Video Note ? ?I connected with Deanna Baker on 01/30/22 at  1:40 PM EST by a video enabled telemedicine application and verified that I am speaking with the correct person using two identifiers. ? ?Location: ?Patient: Home ?Provider: [facility] ?  ?I discussed the limitations of evaluation and management by telemedicine and the availability of in person appointments. The patient expressed understanding and agreed to proceed. ? ?History of Present Illness: ? ?Deanna Baker is a 61 year old female presenting via telemedicine for fever and nausea. Symptoms began last night with a temperature of 101.4 with chills as well. Started vomiting at 7:30 pm last night, and has been on and off multiple times, about 6-7 times. No blood, now dry heaving. Hasn't been able to keep any water down. Tried Tylenol and sprite but couldn't keep it down. Sleeping off and on. This morning temperature 102.3, now 101.4. Mild abdominal cramping pain with vomiting, no diarrhea. Husband having diarrhea and sick as well.  ?  ?Observations/Objective: ? ?General: appears sick, laying in bed ?ENT: conjunctiva normal appearing bilaterally  ?Skin: no rashes, cyanosis or abnormal bruising noted ?Neuro: answers all questions appropriately  ? ?Assessment and Plan: ? ?1. Viral illness/Nausea and vomiting, unspecified vomiting type: Most likely due to GI virus, husband with GI symptoms as well. Sublingual Zofran sent to help with nausea/vomiting, can alternate anti-inflammatories and Tylenol for fevers. Bowel rest for now, then try water, clear liquids as tolerated. If symptoms worsen will call tomorrow to try IM Zofran.  ? ?- ondansetron (ZOFRAN-ODT) 8 MG disintegrating tablet; Take 1 tablet (8 mg total) by mouth every 8 (eight) hours as needed for nausea or vomiting.  Dispense: 20 tablet; Refill: 0 ? ? ?Follow Up Instructions: PRN ? ?  ?I discussed the assessment and treatment plan with the patient. The patient was provided an opportunity to  ask questions and all were answered. The patient agreed with the plan and demonstrated an understanding of the instructions. ?  ?The patient was advised to call back or seek an in-person evaluation if the symptoms worsen or if the condition fails to improve as anticipated. ? ?I provided 10 minutes of non-face-to-face time during this encounter. ? ? ?Teodora Medici, DO ? ?

## 2022-01-30 NOTE — Telephone Encounter (Signed)
Pt has an virtual appt with Dr Rosana Berger for this afternoon ?

## 2022-07-31 NOTE — Progress Notes (Signed)
Name: Deanna Baker   MRN: [id number]    DOB: [birth year]   Date:08/01/2022       Progress Note  Subjective  Chief Complaint  Follow Up  HPI  GERD: she had daily symptoms, she stopped PPI on her own because of low B12 she had EGD and colonoscopy 2021 and showed mild hiatal hernia. She is doing well at this time only triggered by fried food and spicy food    HTN: BP is at goal on norvasc 2.5 mg, no side effects, she denies chest pain, palpitation or SOB.  Seasonal Affective disorder: phq 9 is  negative today  She has seasonal affective disorder but did not want to take medications during winter months. She told me today that for many years she has noticed a sensation of doom when outdoors, sometimes when driving in a intersection also when she goes grocery shopping, explained likely panic attacks.     FMS: she is only taking Gabapentin and ibuprofen prn now, less than once a week on average. . She states she has pain all over,  but still has constant fatigue and mental fogginess. She has chronic fatigue syndrome she states sometimes gets cervical lymphadenopathy . She is having a flare over the past few days    Vasomotor rhinitis she went  to Dr. Tami Ribas for recurrent clear rhinorrhea and vomiting when showering, had a CT sinus that per patient was negative ( unable to see it) she was given nasal spray but not using it. She still gags when she brushes her teeth and occasionally vomits when she takes a hot shower and causes sinus drainage. She was evaluated by GI and ENT, symptoms are stable.    Dyslipidemia: low risk of heart attack and strokes in the next 10 years and not interested in statin therapy .   The 10-year ASCVD risk score (Arnett DK, et al., 2019) is: 11.5%   Values used to calculate the score:     Age: 14 years     Sex: Female     Is Non-Hispanic African American: No     Diabetic: No     Tobacco smoker: Yes     Systolic Blood Pressure: 440 mmHg     Is BP treated: Yes     HDL  Cholesterol: 58 mg/dL     Total Cholesterol: 229 mg/dL   B12 deficiency: last level was at goal , continue medication   Vitamin D deficiency:advised to take 2000 units daily   OSA: unable to tolerate CPAP    Patient Active Problem List   Diagnosis Date Noted   Osteopenia after menopause 12/03/2021   Gastroesophageal reflux disease    B12 deficiency 03/29/2018   Cognitive decline 01/02/2017   Sleep apnea 07/11/2016   Smell or taste sensation disturbance 05/12/2016   Personal history of colonic polyps    Benign neoplasm of transverse colon    Rectal polyp    Tenosynovitis of thumb 03/12/2016   Bilateral knee pain 03/12/2016   Reflux esophagitis    Stricture and stenosis of esophagus    Thickening of esophagus 08/03/2015   Benign essential HTN 05/01/2015   CFIDS (chronic fatigue and immune dysfunction syndrome) ([place]) 05/01/2015   Major depression, recurrent, chronic (HCC) 05/01/2015   Dyslipidemia 05/01/2015   Fibromyalgia syndrome 05/01/2015   Blood glucose elevated 05/01/2015   Eczema intertrigo 05/01/2015   Migraine without aura and without status migrainosus, not intractable 05/01/2015   Excessive urination at night 34/74/2595   Dysmetabolic syndrome  05/01/2015   History of colonoscopy with polypectomy 05/01/2015   Central sleep apnea 05/01/2015   History of back surgery 05/01/2015   Thickened nails 05/01/2015    Past Surgical History:  Procedure Laterality Date   BACK SURGERY     BREAST BIOPSY Left 10/2012   +   BREAST EXCISIONAL BIOPSY Left 11/2012   + 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   COLONOSCOPY  2013   Dr. Suzette Battiest    COLONOSCOPY WITH PROPOFOL N/A 04/28/2016   Procedure: COLONOSCOPY WITH PROPOFOL;  Surgeon: Lucilla Lame, MD;  Location: [facility];  Service: Endoscopy;  Laterality: N/A;  CPAP   COLONOSCOPY WITH PROPOFOL N/A 04/10/2020   Procedure: COLONOSCOPY WITH PROPOFOL;   Surgeon: Lucilla Lame, MD;  Location: [facility] ENDOSCOPY;  Service: Endoscopy;  Laterality: N/A;   ESOPHAGOGASTRODUODENOSCOPY (EGD) WITH PROPOFOL N/A 10/23/2015   Procedure: ESOPHAGOGASTRODUODENOSCOPY (EGD) WITH PROPOFOL;  Surgeon: Lucilla Lame, MD;  Location: [facility] ENDOSCOPY;  Service: Endoscopy;  Laterality: N/A;   ESOPHAGOGASTRODUODENOSCOPY (EGD) WITH PROPOFOL N/A 04/10/2020   Procedure: ESOPHAGOGASTRODUODENOSCOPY (EGD) WITH PROPOFOL;  Surgeon: Lucilla Lame, MD;  Location: [facility] ENDOSCOPY;  Service: Endoscopy;  Laterality: N/A;   POLYPECTOMY  04/28/2016   Procedure: POLYPECTOMY;  Surgeon: Lucilla Lame, MD;  Location: [place];  Service: Endoscopy;;   SHOULDER SURGERY Left 2009   TONSILLECTOMY  1965   TUBAL LIGATION  1982   UPPER GI ENDOSCOPY     Dr. Suzette Battiest    Family History  Problem Relation Age of Onset   Breast cancer Mother 65   Diabetes Mother    Gout Mother    Kidney failure Mother    Hypertension Mother    Congestive Heart Failure Mother    Stomach cancer Maternal Aunt    Brain cancer Paternal Uncle        x 2 mat uncles   Heart attack Maternal Grandmother    Hypertension Maternal Grandmother    Stroke Maternal Grandmother    Epilepsy Maternal Aunt     Social History   Tobacco Use   Smoking status: Former    Packs/day: 0.50    Years: 4.00    Total pack years: 2.00    Types: Cigarettes    Start date: 11/24/1976    Quit date: 11/24/1980    Years since quitting: 41.7   Smokeless tobacco: Never  Substance Use Topics   Alcohol use: Yes    Alcohol/week: 0.0 standard drinks of alcohol    Comment: rarely, 1-2x/mo glass of wine     Current Outpatient Medications:    amLODipine (NORVASC) 2.5 MG tablet, Take 1 tablet (2.5 mg total) by mouth daily., Disp: 90 tablet, Rfl: 1   gabapentin (NEURONTIN) 400 MG capsule, Take 400 mg by mouth as needed (daily PRN)., Disp: , Rfl:    ibuprofen (ADVIL) 400 MG tablet, Take 400 mg by mouth every 6 (six) hours as needed., Disp: ,  Rfl:    Nerve Stimulator (STANDARD TENS) DEVI, 1 Units by Does not apply route daily., Disp: 1 Device, Rfl: 0   ondansetron (ZOFRAN-ODT) 8 MG disintegrating tablet, Take 1 tablet (8 mg total) by mouth every 8 (eight) hours as needed for nausea or vomiting., Disp: 20 tablet, Rfl: 0  Allergies  Allergen Reactions   Ace Inhibitors Swelling   Benicar [Olmesartan] Other (See Comments)    Chest Pressure and Allergic Reaction   Pregabalin     weight  gain and blurred vision   Spironolactone     Chest pain    Venlafaxine     foggy minded    I personally reviewed active problem list, medication list, allergies, family history, social history, health maintenance with the patient/caregiver today.   ROS  Ten systems reviewed and is negative except as mentioned in HPI   Objective  Vitals:   08/01/22 1011  BP: 134/70  Pulse: 81  Resp: 16  SpO2: 98%  Weight: 164 lb (74.4 kg)  Height: 5' (1.524 m)    Body mass index is 32.03 kg/m.  Physical Exam  Constitutional: Patient appears well-developed and well-nourished. Obese  No distress.  HEENT: head atraumatic, normocephalic, pupils equal and reactive to light, neck supple Cardiovascular: Normal rate, regular rhythm and normal heart sounds.  No murmur heard. No BLE edema. Pulmonary/Chest: Effort normal and breath sounds normal. No respiratory distress. Abdominal: Soft.  There is no tenderness. Muscular skeletal: trigger point positive  Psychiatric: Patient has a normal mood and affect. behavior is normal. Judgment and thought content normal.   PHQ2/9:    08/01/2022   10:11 AM 01/30/2022   11:47 AM 01/29/2022    1:08 PM 12/03/2021    2:17 PM 10/31/2021    9:27 AM  Depression screen PHQ 2/9  Decreased Interest 0 0 0 2 0  Down, Depressed, Hopeless 0 0 0 2 0  PHQ - 2 Score 0 0 0 4 0  Altered sleeping 0 0 0 0 0  Tired, decreased energy 3 0 0 2 0  Change in appetite 0 0 0 0 0  Feeling bad or failure about yourself  0 0 0 1 0  Trouble  concentrating 1 0 0 1 0  Moving slowly or fidgety/restless 0 0 0 1 0  Suicidal thoughts 0 0 0 0 0  PHQ-9 Score 4 0 0 9 0  Difficult doing work/chores  Not difficult at all Not difficult at all Somewhat difficult Not difficult at all    phq 9 is negative   Fall Risk:    08/01/2022   10:10 AM 01/30/2022   11:47 AM 01/29/2022    1:08 PM 12/03/2021    2:08 PM 10/31/2021    9:27 AM  Fall Risk   Falls in the past year? 1 0 0 0 0  Number falls in past yr: 0 0 0  0  Injury with Fall? 1 0 0  0  Risk for fall due to : No Fall Risks    No Fall Risks  Follow up Falls prevention discussed   Falls prevention discussed Falls prevention discussed      Functional Status Survey: Is the patient deaf or have difficulty hearing?: No Does the patient have difficulty seeing, even when wearing glasses/contacts?: No Does the patient have difficulty concentrating, remembering, or making decisions?: No Does the patient have difficulty walking or climbing stairs?: No Does the patient have difficulty dressing or bathing?: No Does the patient have difficulty doing errands alone such as visiting a doctor's office or shopping?: No    Assessment & Plan  1. Benign essential HTN  - amLODipine (NORVASC) 2.5 MG tablet; Take 1 tablet (2.5 mg total) by mouth daily.  Dispense: 90 tablet; Refill: 1  2. Panic attack  Likely the reason of sensation of doom triggered by open spaces, driving..  3. Seasonal affective disorder ([place])  She cannot take SSRI, side effects in the past  4. Gastroesophageal reflux disease without esophagitis   5.  Fibromyalgia syndrome  - gabapentin (NEURONTIN) 400 MG capsule; Take 1 capsule (400 mg total) by mouth at bedtime.  Dispense: 90 capsule; Refill: 0  6. CFIDS (chronic fatigue and immune dysfunction syndrome) (HCC)   7. B12 deficiency  Discussed supplementation  8. Dyslipidemia  .

## 2022-08-01 ENCOUNTER — Ambulatory Visit (INDEPENDENT_AMBULATORY_CARE_PROVIDER_SITE_OTHER): Payer: 59 | Admitting: Family Medicine

## 2022-08-01 ENCOUNTER — Encounter: Payer: Self-pay | Admitting: Urgent Care

## 2022-08-01 ENCOUNTER — Encounter: Payer: Self-pay | Admitting: Family Medicine

## 2022-08-01 VITALS — BP 134/70 | HR 81 | Resp 16 | Ht 60.0 in | Wt 164.0 lb

## 2022-08-01 DIAGNOSIS — M797 Fibromyalgia: Secondary | ICD-10-CM

## 2022-08-01 DIAGNOSIS — E785 Hyperlipidemia, unspecified: Secondary | ICD-10-CM

## 2022-08-01 DIAGNOSIS — F338 Other recurrent depressive disorders: Secondary | ICD-10-CM

## 2022-08-01 DIAGNOSIS — F410 Panic disorder [episodic paroxysmal anxiety] without agoraphobia: Secondary | ICD-10-CM | POA: Diagnosis not present

## 2022-08-01 DIAGNOSIS — I10 Essential (primary) hypertension: Secondary | ICD-10-CM

## 2022-08-01 DIAGNOSIS — G9332 Myalgic encephalomyelitis/chronic fatigue syndrome: Secondary | ICD-10-CM

## 2022-08-01 DIAGNOSIS — D8989 Other specified disorders involving the immune mechanism, not elsewhere classified: Secondary | ICD-10-CM

## 2022-08-01 DIAGNOSIS — K219 Gastro-esophageal reflux disease without esophagitis: Secondary | ICD-10-CM

## 2022-08-01 DIAGNOSIS — E538 Deficiency of other specified B group vitamins: Secondary | ICD-10-CM

## 2022-08-01 MED ORDER — GABAPENTIN 400 MG PO CAPS: 400.0000 mg | ORAL_CAPSULE | Freq: Every day | ORAL | 0 refills | Status: DC

## 2022-08-01 MED ORDER — AMLODIPINE BESYLATE 2.5 MG PO TABS: 2.5000 mg | ORAL_TABLET | Freq: Every day | ORAL | 1 refills | Status: DC

## 2022-08-01 NOTE — Patient Instructions (Signed)
Cognitive Behavioral therapy

## 2022-09-22 ENCOUNTER — Encounter: Payer: Self-pay | Admitting: Urgent Care

## 2022-09-23 ENCOUNTER — Encounter: Payer: Self-pay | Admitting: Urgent Care

## 2022-11-25 ENCOUNTER — Encounter: Payer: Self-pay | Admitting: Family Medicine

## 2022-11-27 ENCOUNTER — Other Ambulatory Visit: Payer: Self-pay | Admitting: Family Medicine

## 2022-11-27 DIAGNOSIS — E785 Hyperlipidemia, unspecified: Secondary | ICD-10-CM

## 2022-11-27 DIAGNOSIS — M8580 Other specified disorders of bone density and structure, unspecified site: Secondary | ICD-10-CM

## 2022-11-27 DIAGNOSIS — E538 Deficiency of other specified B group vitamins: Secondary | ICD-10-CM

## 2022-11-27 DIAGNOSIS — I10 Essential (primary) hypertension: Secondary | ICD-10-CM

## 2022-11-27 DIAGNOSIS — R739 Hyperglycemia, unspecified: Secondary | ICD-10-CM

## 2022-11-29 LAB — CBC WITH DIFFERENTIAL/PLATELET
Absolute Monocytes: 281 cells/uL (ref 200–950)
Basophils Absolute: 9 cells/uL (ref 0–200)
Basophils Relative: 0.2 %
Eosinophils Absolute: 143 cells/uL (ref 15–500)
Eosinophils Relative: 3.1 %
HCT: 39.7 % (ref 35.0–45.0)
Hemoglobin: 13.3 g/dL (ref 11.7–15.5)
Lymphs Abs: 1260 cells/uL (ref 850–3900)
MCH: 29.3 pg (ref 27.0–33.0)
MCHC: 33.5 g/dL (ref 32.0–36.0)
MCV: 87.4 fL (ref 80.0–100.0)
MPV: 11.1 fL (ref 7.5–12.5)
Monocytes Relative: 6.1 %
Neutro Abs: 2907 cells/uL (ref 1500–7800)
Neutrophils Relative %: 63.2 %
Platelets: 216 10*3/uL (ref 140–400)
RBC: 4.54 10*6/uL (ref 3.80–5.10)
RDW: 13 % (ref 11.0–15.0)
Total Lymphocyte: 27.4 %
WBC: 4.6 10*3/uL (ref 3.8–10.8)

## 2022-11-29 LAB — HEMOGLOBIN A1C
Hgb A1c MFr Bld: 5.6 % of total Hgb (ref <5.7)
Mean Plasma Glucose: 114 mg/dL
eAG (mmol/L): 6.3 mmol/L

## 2022-11-29 LAB — COMPLETE METABOLIC PANEL WITH GFR
AG Ratio: 1.8 (calc) (ref 1.0–2.5)
ALT: 9 U/L (ref 6–29)
AST: 13 U/L (ref 10–35)
Albumin: 4.4 g/dL (ref 3.6–5.1)
Alkaline phosphatase (APISO): 95 U/L (ref 37–153)
BUN: 13 mg/dL (ref 7–25)
CO2: 27 mmol/L (ref 20–32)
Calcium: 9.6 mg/dL (ref 8.6–10.4)
Chloride: 106 mmol/L (ref 98–110)
Creat: 0.85 mg/dL (ref 0.50–1.05)
Globulin: 2.5 g/dL (calc) (ref 1.9–3.7)
Glucose, Bld: 101 mg/dL — ABNORMAL HIGH (ref 65–99)
Potassium: 4.1 mmol/L (ref 3.5–5.3)
Sodium: 143 mmol/L (ref 135–146)
Total Bilirubin: 0.5 mg/dL (ref 0.2–1.2)
Total Protein: 6.9 g/dL (ref 6.1–8.1)
eGFR: 77 mL/min/{1.73_m2} (ref >=60)

## 2022-11-29 LAB — LIPID PANEL
Cholesterol: 253 mg/dL — ABNORMAL HIGH (ref <200)
HDL: 58 mg/dL (ref >=50)
LDL Cholesterol (Calc): 162 mg/dL (calc) — ABNORMAL HIGH
Non-HDL Cholesterol (Calc): 195 mg/dL (calc) — ABNORMAL HIGH (ref <130)
Total CHOL/HDL Ratio: 4.4 (calc) (ref <5.0)
Triglycerides: 180 mg/dL — ABNORMAL HIGH (ref <150)

## 2022-11-29 LAB — VITAMIN D 25 HYDROXY (VIT D DEFICIENCY, FRACTURES): Vit D, 25-Hydroxy: 30 ng/mL (ref 30–100)

## 2022-11-29 LAB — VITAMIN B12: Vitamin B-12: 982 pg/mL (ref 200–1100)

## 2022-12-04 NOTE — Patient Instructions (Addendum)
You are due for Tdap, Shingrix and RSV  Preventive Care 28-63 Years Old, Female Preventive care refers to lifestyle choices and visits with your health care provider that can promote health and wellness. Preventive care visits are also called wellness exams. What can I expect for my preventive care visit? Counseling Your health care provider may ask you questions about your: Medical history, including: Past medical problems. Family medical history. Pregnancy history. Current health, including: Menstrual cycle. Method of birth control. Emotional well-being. Home life and relationship well-being. Sexual activity and sexual health. Lifestyle, including: Alcohol, nicotine or tobacco, and drug use. Access to firearms. Diet, exercise, and sleep habits. Work and work Statistician. Sunscreen use. Safety issues such as seatbelt and bike helmet use. Physical exam Your health care provider will check your: Height and weight. These may be used to calculate your BMI (body mass index). BMI is a measurement that tells if you are at a healthy weight. Waist circumference. This measures the distance around your waistline. This measurement also tells if you are at a healthy weight and may help predict your risk of certain diseases, such as type 2 diabetes and high blood pressure. Heart rate and blood pressure. Body temperature. Skin for abnormal spots. What immunizations do I need?  Vaccines are usually given at various ages, according to a schedule. Your health care provider will recommend vaccines for you based on your age, medical history, and lifestyle or other factors, such as travel or where you work. What tests do I need? Screening Your health care provider may recommend screening tests for certain conditions. This may include: Lipid and cholesterol levels. Diabetes screening. This is done by checking your blood sugar (glucose) after you have not eaten for a while (fasting). Pelvic exam and  Pap test. Hepatitis B test. Hepatitis C test. HIV (human immunodeficiency virus) test. STI (sexually transmitted infection) testing, if you are at risk. Lung cancer screening. Colorectal cancer screening. Mammogram. Talk with your health care provider about when you should start having regular mammograms. This may depend on whether you have a family history of breast cancer. BRCA-related cancer screening. This may be done if you have a family history of breast, ovarian, tubal, or peritoneal cancers. Bone density scan. This is done to screen for osteoporosis. Talk with your health care provider about your test results, treatment options, and if necessary, the need for more tests. Follow these instructions at home: Eating and drinking  Eat a diet that includes fresh fruits and vegetables, whole grains, lean protein, and low-fat dairy products. Take vitamin and mineral supplements as recommended by your health care provider. Do not drink alcohol if: Your health care provider tells you not to drink. You are pregnant, may be pregnant, or are planning to become pregnant. If you drink alcohol: Limit how much you have to 0-1 drink a day. Know how much alcohol is in your drink. In the [place] one drink equals one 12 oz bottle of beer (355 mL), one 5 oz glass of wine (148 mL), or one 1 oz glass of hard liquor (44 mL). Lifestyle Brush your teeth every morning and night with fluoride toothpaste. Floss one time each day. Exercise for at least 30 minutes 5 or more days each week. Do not use any products that contain nicotine or tobacco. These products include cigarettes, chewing tobacco, and vaping devices, such as e-cigarettes. If you need help quitting, ask your health care provider. Do not use drugs. If you are sexually active, practice safe sex.  Use a condom or other form of protection to prevent STIs. If you do not wish to become pregnant, use a form of birth control. If you plan to become  pregnant, see your health care provider for a prepregnancy visit. Take aspirin only as told by your health care provider. Make sure that you understand how much to take and what form to take. Work with your health care provider to find out whether it is safe and beneficial for you to take aspirin daily. Find healthy ways to manage stress, such as: Meditation, yoga, or listening to music. Journaling. Talking to a trusted person. Spending time with friends and family. Minimize exposure to UV radiation to reduce your risk of skin cancer. Safety Always wear your seat belt while driving or riding in a vehicle. Do not drive: If you have been drinking alcohol. Do not ride with someone who has been drinking. When you are tired or distracted. While texting. If you have been using any mind-altering substances or drugs. Wear a helmet and other protective equipment during sports activities. If you have firearms in your house, make sure you follow all gun safety procedures. Seek help if you have been physically or sexually abused. What's next? Visit your health care provider once a year for an annual wellness visit. Ask your health care provider how often you should have your eyes and teeth checked. Stay up to date on all vaccines. This information is not intended to replace advice given to you by your health care provider. Make sure you discuss any questions you have with your health care provider. Document Revised: 05/08/2021 Document Reviewed: 05/08/2021 Elsevier Patient Education  [place].

## 2022-12-04 NOTE — Progress Notes (Signed)
Name: Deanna Baker   MRN: [id number]    DOB: 1961-06-25   Date:12/05/2022       Progress Note  Subjective  Chief Complaint  Annual Exam  HPI  Patient presents for annual CPE.  Diet: she cooks at home, balanced diet  Exercise: she walk 5-7 days a week with her dog   Last Eye Exam: she is due for an eye exam  Last Dental Exam: up to date   [employer] Visit from 12/05/2022 in [facility]  AUDIT-C Score 1      Depression: Phq 9 is  positive     12/05/2022    9:56 AM 08/01/2022   10:11 AM 01/30/2022   11:47 AM 01/29/2022    1:08 PM 12/03/2021    2:17 PM  Depression screen PHQ 2/9  Decreased Interest 2 0 0 0 2  Down, Depressed, Hopeless 1 0 0 0 2  PHQ - 2 Score 3 0 0 0 4  Altered sleeping 0 0 0 0 0  Tired, decreased energy 3 3 0 0 2  Change in appetite 0 0 0 0 0  Feeling bad or failure about yourself  1 0 0 0 1  Trouble concentrating 0 1 0 0 1  Moving slowly or fidgety/restless 1 0 0 0 1  Suicidal thoughts 0 0 0 0 0  PHQ-9 Score 8 4 0 0 9  Difficult doing work/chores Somewhat difficult  Not difficult at all Not difficult at all Somewhat difficult   Hypertension: BP Readings from Last 3 Encounters:  12/05/22 132/82  08/01/22 134/70  01/29/22 132/82   Obesity: Wt Readings from Last 3 Encounters:  12/05/22 158 lb 9.6 oz (71.9 kg)  08/01/22 164 lb (74.4 kg)  01/29/22 165 lb 6.4 oz (75 kg)   BMI Readings from Last 3 Encounters:  12/05/22 30.97 kg/m  08/01/22 32.03 kg/m  01/30/22 32.30 kg/m     Vaccines:   RSV: discussed  Tdap: 2012, due and she will check with insurance  Shingrix: discussed with patient  Pneumonia: up to date  Flu: 2020, declined COVID-19: refused    Hep C Screening: 10/16/14 STD testing and prevention (HIV/chl/gon/syphilis): 04/24/89 Intimate partner violence: negative screen  Sexual History : husband has lack of libido, they have not been sexually active because of that  Menstrual History/LMP/Abnormal Bleeding:  post-menopausal  Discussed importance of follow up if any post-menopausal bleeding: yes  Incontinence Symptoms: negative for symptoms   Breast cancer:  - Last Mammogram: 01/06/22 - BRCA gene screening: N/A  Osteoporosis Prevention : Discussed high calcium and vitamin D supplementation, weight bearing exercises Bone density: 02/12/15, we will recheck it now   Cervical cancer screening: 12/03/21  Skin cancer: Discussed monitoring for atypical lesions  Colorectal cancer: 04/10/20   Lung cancer:  Low Dose CT Chest recommended if Age 93-80 years, 20 pack-year currently smoking OR have quit w/in 15years. Patient does not qualify for screen   ECG: 09/30/18  Advanced Care Planning: A voluntary discussion about advance care planning including the explanation and discussion of advance directives.  Discussed health care proxy and Living will, and the patient was able to identify a health care proxy as husband .  Patient has a living will and power of attorney of health care   Lipids: Lab Results  Component Value Date   CHOL 253 (H) 11/28/2022   CHOL 229 (H) 10/28/2021   CHOL 248 (H) 10/22/2020   Lab Results  Component Value Date  HDL 58 11/28/2022   HDL 58 10/28/2021   HDL 59 10/22/2020   Lab Results  Component Value Date   LDLCALC 162 (H) 11/28/2022   LDLCALC 145 (H) 10/28/2021   LDLCALC 165 (H) 10/22/2020   Lab Results  Component Value Date   TRIG 180 (H) 11/28/2022   TRIG 135 10/28/2021   TRIG 118 10/22/2020   Lab Results  Component Value Date   CHOLHDL 4.4 11/28/2022   CHOLHDL 3.9 10/28/2021   CHOLHDL 4.2 10/22/2020   No results found for: "LDLDIRECT"  Glucose: Glucose  Date Value Ref Range Status  02/20/2015 113 (H) mg/dL Final    Comment:    65-99 NOTE: New Reference Range  01/30/15   08/22/2014 94 65 - 99 mg/dL Final  02/14/2014 117 (H) 65 - 99 mg/dL Final   Glucose, Bld  Date Value Ref Range Status  11/28/2022 101 (H) 65 - 99 mg/dL Final    Comment:     .            Fasting reference interval . For someone without known diabetes, a glucose value between 100 and 125 mg/dL is consistent with prediabetes and should be confirmed with a follow-up test. .   10/28/2021 101 (H) 65 - 99 mg/dL Final    Comment:    .            Fasting reference interval . For someone without known diabetes, a glucose value between 100 and 125 mg/dL is consistent with prediabetes and should be confirmed with a follow-up test. .   10/22/2020 107 (H) 65 - 99 mg/dL Final    Comment:    .            Fasting reference interval . For someone without known diabetes, a glucose value between 100 and 125 mg/dL is consistent with prediabetes and should be confirmed with a follow-up test. .    Glucose-Capillary  Date Value Ref Range Status  08/16/2015 124 (H) 65 - 99 mg/dL Final    Patient Active Problem List   Diagnosis Date Noted   Osteopenia after menopause 12/03/2021   Gastroesophageal reflux disease    B12 deficiency 03/29/2018   Cognitive decline 01/02/2017   Sleep apnea 07/11/2016   Smell or taste sensation disturbance 05/12/2016   Personal history of colonic polyps    Benign neoplasm of transverse colon    Rectal polyp    Tenosynovitis of thumb 03/12/2016   Bilateral knee pain 03/12/2016   Reflux esophagitis    Stricture and stenosis of esophagus    Thickening of esophagus 08/03/2015   Benign essential HTN 05/01/2015   CFIDS (chronic fatigue and immune dysfunction syndrome) ([place]) 05/01/2015   Major depression, recurrent, chronic ([place]) 05/01/2015   Dyslipidemia 05/01/2015   Fibromyalgia syndrome 05/01/2015   Blood glucose elevated 05/01/2015   Eczema intertrigo 05/01/2015   Migraine without aura and without status migrainosus, not intractable 05/01/2015   Excessive urination at night 60/08/9322   Dysmetabolic syndrome 55/73/2202   History of colonoscopy with polypectomy 05/01/2015   Central sleep apnea 05/01/2015   History of back  surgery 05/01/2015   Thickened nails 05/01/2015    Past Surgical History:  Procedure Laterality Date   BACK SURGERY     BREAST BIOPSY Left 10/2012   +   BREAST EXCISIONAL BIOPSY Left 11/2012   + 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   COLONOSCOPY  2013   Dr. Suzette Battiest    COLONOSCOPY WITH PROPOFOL N/A 04/28/2016   Procedure: COLONOSCOPY WITH PROPOFOL;  Surgeon: Lucilla Lame, MD;  Location: [place];  Service: Endoscopy;  Laterality: N/A;  CPAP   COLONOSCOPY WITH PROPOFOL N/A 04/10/2020   Procedure: COLONOSCOPY WITH PROPOFOL;  Surgeon: Lucilla Lame, MD;  [facility] ENDOSCOPY;  Service: Endoscopy;  Laterality: N/A;   ESOPHAGOGASTRODUODENOSCOPY (EGD) WITH PROPOFOL N/A 10/23/2015   Procedure: ESOPHAGOGASTRODUODENOSCOPY (EGD) WITH PROPOFOL;  Surgeon: Lucilla Lame, MD;  Location: [facility] ENDOSCOPY;  Service: Endoscopy;  Laterality: N/A;   ESOPHAGOGASTRODUODENOSCOPY (EGD) WITH PROPOFOL N/A 04/10/2020   Procedure: ESOPHAGOGASTRODUODENOSCOPY (EGD) WITH PROPOFOL;  Surgeon: Lucilla Lame, MD;  Location: [facility] ENDOSCOPY;  Service: Endoscopy;  Laterality: N/A;   POLYPECTOMY  04/28/2016   Procedure: POLYPECTOMY;  Surgeon: Lucilla Lame, MD;  Location: [place];  Service: Endoscopy;;   SHOULDER SURGERY Left 2009   TONSILLECTOMY  1965   TUBAL LIGATION  1982   UPPER GI ENDOSCOPY     Dr. Suzette Battiest    Family History  Problem Relation Age of Onset   Breast cancer Mother 50   Diabetes Mother    Gout Mother    Kidney failure Mother    Hypertension Mother    Congestive Heart Failure Mother    Stomach cancer Maternal Aunt    Brain cancer Paternal Uncle        x 2 mat uncles   Heart attack Maternal Grandmother    Hypertension Maternal Grandmother    Stroke Maternal Grandmother    Epilepsy Maternal Aunt     Social History   Socioeconomic History   Marital status: Married    Spouse name: Christia Reading   Number of  children: 1   Years of education: Not on file   Highest education level: GED or equivalent  Occupational History   Occupation: Unemployed  Tobacco Use   Smoking status: Former    Packs/day: 0.50    Years: 4.00    Total pack years: 2.00    Types: Cigarettes    Start date: 11/24/1976    Quit date: 11/24/1980    Years since quitting: 42.0   Smokeless tobacco: Never  Vaping Use   Vaping Use: Never used  Substance and Sexual Activity   Alcohol use: Yes    Alcohol/week: 0.0 standard drinks of alcohol    Comment: rarely, 1-2x/mo glass of wine   Drug use: No   Sexual activity: Yes    Partners: Male    Birth control/protection: Post-menopausal  Other Topics Concern   Not on file  Social History Narrative   Not on file   Social Determinants of Health   Financial Resource Strain: Low Risk  (12/05/2022)   Overall Financial Resource Strain (CARDIA)    Difficulty of Paying Living Expenses: Not hard at all  Food Insecurity: No Food Insecurity (12/05/2022)   Hunger Vital Sign    Worried About Running Out of Food in the Last Year: Never true    Ran Out of Food in the Last Year: Never true  Transportation Needs: No Transportation Needs (12/05/2022)   PRAPARE - Hydrologist (Medical): No    Lack of Transportation (Non-Medical): No  Physical Activity: Sufficiently Active (12/05/2022)   Exercise Vital Sign    Days of Exercise per Week: 6 days    Minutes of Exercise per Session: 40 min  Stress: No Stress Concern Present (12/05/2022)   [place]  Feeling of Stress : Only a little  Social Connections: Socially Integrated (12/05/2022)   Social Connection and Isolation Panel [NHANES]    Frequency of Communication with Friends and Family: Twice a week    Frequency of Social Gatherings with Friends and Family: Once a week    Attends Religious Services: More than 4 times per year    Active Member of [employer]  or Organizations: Yes    Attends Music therapist: More than 4 times per year    Marital Status: Married  Human resources officer Violence: Not At Risk (12/05/2022)   Humiliation, Afraid, Rape, and Kick questionnaire    Fear of Current or Ex-Partner: No    Emotionally Abused: No    Physically Abused: No    Sexually Abused: No     Current Outpatient Medications:    amLODipine (NORVASC) 2.5 MG tablet, Take 1 tablet (2.5 mg total) by mouth daily., Disp: 90 tablet, Rfl: 1   gabapentin (NEURONTIN) 400 MG capsule, Take 1 capsule (400 mg total) by mouth at bedtime., Disp: 90 capsule, Rfl: 0   ibuprofen (ADVIL) 400 MG tablet, Take 400 mg by mouth every 6 (six) hours as needed., Disp: , Rfl:    Nerve Stimulator (STANDARD TENS) DEVI, 1 Units by Does not apply route daily., Disp: 1 Device, Rfl: 0   ondansetron (ZOFRAN-ODT) 8 MG disintegrating tablet, Take 1 tablet (8 mg total) by mouth every 8 (eight) hours as needed for nausea or vomiting., Disp: 20 tablet, Rfl: 0  Allergies  Allergen Reactions   Ace Inhibitors Swelling   Benicar [Olmesartan] Other (See Comments)    Chest Pressure and Allergic Reaction   Pregabalin     weight gain and blurred vision   Spironolactone     Chest pain    Venlafaxine     foggy minded     ROS  Constitutional: Negative for fever , positive for weight change - lost weight .  Respiratory: Negative for cough and shortness of breath.   Cardiovascular: Negative for chest pain or palpitations.  Gastrointestinal: Negative for abdominal pain, no bowel changes.  Musculoskeletal: Negative for gait problem or joint swelling. Body aches/pain  Skin: Negative for rash.  Neurological: Negative for dizziness or headache.  No other specific complaints in a complete review of systems (except as listed in HPI above).   Objective  Vitals:   12/05/22 0948  BP: 132/82  Pulse: 80  Resp: 18  Temp: 98.1 F (36.7 C)  TempSrc: Oral  SpO2: 99%  Weight: 158 lb 9.6 oz  (71.9 kg)  Height: 5' (1.524 m)    Body mass index is 30.97 kg/m.  Physical Exam  Constitutional: Patient appears well-developed and well-nourished.Obesity  No distress.  HENT: Head: Normocephalic and atraumatic. Ears: B TMs ok, no erythema or effusion; Nose: Nose normal. Mouth/Throat: Oropharynx is clear and moist. No oropharyngeal exudate.  Eyes: Conjunctivae and EOM are normal. Pupils are equal, round, and reactive to light. No scleral icterus.  Neck: Normal range of motion. Neck supple. No JVD present. No thyromegaly present.  Cardiovascular: Normal rate, regular rhythm and normal heart sounds.  No murmur heard. No BLE edema. Pulmonary/Chest: Effort normal and breath sounds normal. No respiratory distress. Abdominal: Soft. Bowel sounds are normal, no distension. There is no tenderness. no masses Breast: no lumps or masses, no nipple discharge or rashes FEMALE GENITALIA:  Not done  RECTAL: not done Musculoskeletal: Normal range of motion, no joint effusions. No gross deformities Neurological: he is alert and  oriented to person, place, and time. No cranial nerve deficit. Coordination, balance, strength, speech and gait are normal.  Skin: Skin is warm and dry. No rash noted. No erythema.  Psychiatric: Patient has a normal mood and affect. behavior is normal. Judgment and thought content normal.   Recent Results (from the past 2160 hour(s))  Lipid panel     Status: Abnormal   Collection Time: 11/28/22  9:44 AM  Result Value Ref Range   Cholesterol 253 (H) <200 mg/dL   HDL 58 > OR = 50 mg/dL   Triglycerides 180 (H) <150 mg/dL   LDL Cholesterol (Calc) 162 (H) mg/dL (calc)    Comment: Reference range: <100 . Desirable range <100 mg/dL for primary prevention;   <70 mg/dL for patients with CHD or diabetic patients  with > or = 2 CHD risk factors. Marland Kitchen LDL-C is now calculated using the Martin-Hopkins  calculation, which is a validated novel method providing  better accuracy than the  Friedewald equation in the  estimation of LDL-C.  Cresenciano Genre et al. Annamaria Helling. 3500;938(18): 2061-2068  (http://education.[facility].com/faq/FAQ164)    Total CHOL/HDL Ratio 4.4 <5.0 (calc)   Non-HDL Cholesterol (Calc) 195 (H) <130 mg/dL (calc)    Comment: For patients with diabetes plus 1 major ASCVD risk  factor, treating to a non-HDL-C goal of <100 mg/dL  (LDL-C of <70 mg/dL) is considered a therapeutic  option.   COMPLETE METABOLIC PANEL WITH GFR     Status: Abnormal   Collection Time: 11/28/22  9:44 AM  Result Value Ref Range   Glucose, Bld 101 (H) 65 - 99 mg/dL    Comment: .            Fasting reference interval . For someone without known diabetes, a glucose value between 100 and 125 mg/dL is consistent with prediabetes and should be confirmed with a follow-up test. .    BUN 13 7 - 25 mg/dL   Creat 0.85 0.50 - 1.05 mg/dL   eGFR 77 > OR = 60 mL/min/1.92m   BUN/Creatinine Ratio SEE NOTE: 6 - 22 (calc)    Comment:    Not Reported: BUN and Creatinine are within    reference range. .    Sodium 143 135 - 146 mmol/L   Potassium 4.1 3.5 - 5.3 mmol/L   Chloride 106 98 - 110 mmol/L   CO2 27 20 - 32 mmol/L   Calcium 9.6 8.6 - 10.4 mg/dL   Total Protein 6.9 6.1 - 8.1 g/dL   Albumin 4.4 3.6 - 5.1 g/dL   Globulin 2.5 1.9 - 3.7 g/dL (calc)   AG Ratio 1.8 1.0 - 2.5 (calc)   Total Bilirubin 0.5 0.2 - 1.2 mg/dL   Alkaline phosphatase (APISO) 95 37 - 153 U/L   AST 13 10 - 35 U/L   ALT 9 6 - 29 U/L  CBC with Differential/Platelet     Status: None   Collection Time: 11/28/22  9:44 AM  Result Value Ref Range   WBC 4.6 3.8 - 10.8 Thousand/uL   RBC 4.54 3.80 - 5.10 Million/uL   Hemoglobin 13.3 11.7 - 15.5 g/dL   HCT 39.7 35.0 - 45.0 %   MCV 87.4 80.0 - 100.0 fL   MCH 29.3 27.0 - 33.0 pg   MCHC 33.5 32.0 - 36.0 g/dL   RDW 13.0 11.0 - 15.0 %   Platelets 216 140 - 400 Thousand/uL   MPV 11.1 7.5 - 12.5 fL   Neutro Abs 2,907 1,500 - 7,800 cells/uL  Lymphs Abs 1,260 850 - 3,900  cells/uL   Absolute Monocytes 281 200 - 950 cells/uL   Eosinophils Absolute 143 15 - 500 cells/uL   Basophils Absolute 9 0 - 200 cells/uL   Neutrophils Relative % 63.2 %   Total Lymphocyte 27.4 %   Monocytes Relative 6.1 %   Eosinophils Relative 3.1 %   Basophils Relative 0.2 %  Vitamin B12     Status: None   Collection Time: 11/28/22  9:44 AM  Result Value Ref Range   Vitamin B-12 982 200 - 1,100 pg/mL  VITAMIN D 25 Hydroxy (Vit-D Deficiency, Fractures)     Status: None   Collection Time: 11/28/22  9:44 AM  Result Value Ref Range   Vit D, 25-Hydroxy 30 30 - 100 ng/mL    Comment: Vitamin D Status         25-OH Vitamin D: . Deficiency:                    <20 ng/mL Insufficiency:             20 - 29 ng/mL Optimal:                 > or = 30 ng/mL . For 25-OH Vitamin D testing on patients on  D2-supplementation and patients for whom quantitation  of D2 and D3 fractions is required, the [facility](TM) 25-OH VIT D, (D2,D3), LC/MS/MS is recommended: order  code [id number] 2531 (patients >21yr). . See Note 1 . Note 1 . For additional information, please refer to  http://education.[facility].com/faq/FAQ199  (This link is being provided for informational/ educational purposes only.)   Hemoglobin A1c     Status: None   Collection Time: 11/28/22  9:44 AM  Result Value Ref Range   Hgb A1c MFr Bld 5.6 <5.7 % of total Hgb    Comment: For the purpose of screening for the presence of diabetes: . <5.7%       Consistent with the absence of diabetes 5.7-6.4%    Consistent with increased risk for diabetes             (prediabetes) > or =6.5%  Consistent with diabetes . This assay result is consistent with a decreased risk of diabetes. . Currently, no consensus exists regarding use of hemoglobin A1c for diagnosis of diabetes in children. . According to American Diabetes Association (ADA) guidelines, hemoglobin A1c <7.0% represents optimal control in non-pregnant diabetic patients.  Different metrics may apply to specific patient populations.  Standards of Medical Care in Diabetes(ADA). .    Mean Plasma Glucose 114 mg/dL   eAG (mmol/L) 6.3 mmol/L     Fall Risk:    12/05/2022    9:51 AM 08/01/2022   10:10 AM 01/30/2022   11:47 AM 01/29/2022    1:08 PM 12/03/2021    2:08 PM  Fall Risk   Falls in the past year? 1 1 0 0 0  Number falls in past yr: 0 0 0 0   Injury with Fall? 0 1 0 0   Risk for fall due to : No Fall Risks;History of fall(s) No Fall Risks     Follow up Falls prevention discussed;Education provided;Falls evaluation completed Falls prevention discussed   Falls prevention discussed     Functional Status Survey: Is the patient deaf or have difficulty hearing?: No Does the patient have difficulty seeing, even when wearing glasses/contacts?: No Does the patient have difficulty concentrating, remembering, or making decisions?: Yes Does the patient have difficulty  walking or climbing stairs?: No Does the patient have difficulty dressing or bathing?: No Does the patient have difficulty doing errands alone such as visiting a doctor's office or shopping?: No   Assessment & Plan  1. Well adult exam  Reviewed labs   2. Breast cancer screening by mammogram  - MM 3D SCREEN BREAST BILATERAL; Future  3. Ovarian failure  - DG Bone Density; Future  4. Osteoporosis screening  - DG Bone Density; Future    -USPSTF grade A and B recommendations reviewed with patient; age-appropriate recommendations, preventive care, screening tests, etc discussed and encouraged; healthy living encouraged; see AVS for patient education given to patient -Discussed importance of 150 minutes of physical activity weekly, eat two servings of fish weekly, eat one serving of tree nuts ( cashews, pistachios, pecans, almonds.Marland Kitchen) every other day, eat 6 servings of fruit/vegetables daily and drink plenty of water and avoid sweet beverages.   -Reviewed Health Maintenance: Yes.

## 2022-12-05 ENCOUNTER — Encounter: Payer: Self-pay | Admitting: Family Medicine

## 2022-12-05 ENCOUNTER — Ambulatory Visit (INDEPENDENT_AMBULATORY_CARE_PROVIDER_SITE_OTHER): Payer: 59 | Admitting: Family Medicine

## 2022-12-05 VITALS — BP 132/82 | HR 80 | Temp 98.1°F | Resp 18 | Ht 60.0 in | Wt 158.6 lb

## 2022-12-05 DIAGNOSIS — Z13820 Encounter for screening for osteoporosis: Secondary | ICD-10-CM

## 2022-12-05 DIAGNOSIS — Z1231 Encounter for screening mammogram for malignant neoplasm of breast: Secondary | ICD-10-CM | POA: Diagnosis not present

## 2022-12-05 DIAGNOSIS — E2839 Other primary ovarian failure: Secondary | ICD-10-CM | POA: Diagnosis not present

## 2022-12-05 DIAGNOSIS — Z0000 Encounter for general adult medical examination without abnormal findings: Secondary | ICD-10-CM | POA: Diagnosis not present

## 2022-12-16 ENCOUNTER — Other Ambulatory Visit: Payer: Self-pay

## 2023-01-01 NOTE — Progress Notes (Signed)
Name: Deanna Baker   MRN: [id number]    DOB: 1961/10/07   Date:01/02/2023       Progress Note  Subjective  Chief Complaint  Follow Up  HPI  GERD: she had daily symptoms, she stopped PPI on her own because of low B12 she had EGD and colonoscopy 2021 and showed mild hiatal hernia. She is doing well at this time only triggered by fried food and spicy food . She had one episode    HTN: BP is at goal on norvasc 2.5 mg, no side effects, she denies chest pain, palpitation or SOB.She will get a new bp monitor and if bp is high we will increase dose to up to BID if needed  Seasonal Affective disorder: phq 9 is  negative today  She has seasonal affective disorder but did not want to take medications during winter months. She states during the winter she spends a lot of time indoors. She is making sure she goes to the store, going to church and walks her dog, she states nature helps her emotionally     FMS/Chronic Fatigue syndrome: she takes Gabapentin 400 mg qhs prn, and other times ibuprofen.  She states she has pain all over,  but still has constant fatigue and mental fogginess. She has chronic fatigue syndrome she states sometimes gets cervical lymphadenopathy, on Tuesday after she vacuumed her house she had difficulty swallowing but it resolved with tongue movement . She states she feels like she is not in control of her body, sometimes her body feels like her body is moving when she stopped in a traffic light. She states it is affecting her emotionally. Discussed referral to therapist for CBT    Vasomotor rhinitis she went  to Dr. Tami Ribas for recurrent clear rhinorrhea and vomiting when showering, had a CT sinus that per patient was negative ( unable to see it) she was given nasal spray but not using it. She still gags when she brushes her teeth and occasionally vomits when she takes a hot shower and causes sinus drainage. She was evaluated by GI and ENT, unchanged    Dyslipidemia: .she does not want  to try statin therapy due to [place], she is wiling to try Zetia  The 10-year ASCVD risk score (Arnett DK, et al., 2019) is: 13.2%   Values used to calculate the score:     Age: 62 years     Sex: Female     Is Non-Hispanic African American: No     Diabetic: No     Tobacco smoker: Yes     Systolic Blood Pressure: [id number] mmHg     Is BP treated: Yes     HDL Cholesterol: 58 mg/dL     Total Cholesterol: 253 mg/dL   B12 deficiency: last level was at goal , continue medication   Vitamin D deficiency:advised to take 2000 units daily   OSA: unable to tolerate CPAP , she still snores and wakes up during the night, she wakes up feeling tired    Patient Active Problem List   Diagnosis Date Noted   Osteopenia after menopause 12/03/2021   Gastroesophageal reflux disease    B12 deficiency 03/29/2018   Cognitive decline 01/02/2017   Sleep apnea 07/11/2016   Smell or taste sensation disturbance 05/12/2016   Personal history of colonic polyps    Benign neoplasm of transverse colon    Rectal polyp    Tenosynovitis of thumb 03/12/2016   Bilateral knee pain 03/12/2016   Reflux esophagitis  Stricture and stenosis of esophagus    Thickening of esophagus 08/03/2015   Benign essential HTN 05/01/2015   CFIDS (chronic fatigue and immune dysfunction syndrome) ([place]) 05/01/2015   Major depression, recurrent, chronic (HCC) 05/01/2015   Dyslipidemia 05/01/2015   Fibromyalgia syndrome 05/01/2015   Blood glucose elevated 05/01/2015   Eczema intertrigo 05/01/2015   Migraine without aura and without status migrainosus, not intractable 05/01/2015   Excessive urination at night [id number]   Dysmetabolic syndrome [id number]   History of colonoscopy with polypectomy 05/01/2015   Central sleep apnea 05/01/2015   History of back surgery 05/01/2015   Thickened nails 05/01/2015    Past Surgical History:  Procedure Laterality Date   BACK SURGERY     BREAST BIOPSY Left 10/2012   +   BREAST EXCISIONAL BIOPSY  Left 11/2012   + 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   COLONOSCOPY  2013   Dr. Suzette Battiest    COLONOSCOPY WITH PROPOFOL N/A 04/28/2016   Procedure: COLONOSCOPY WITH PROPOFOL;  Surgeon: Lucilla Lame, MD;  Location: [place];  Service: Endoscopy;  Laterality: N/A;  CPAP   COLONOSCOPY WITH PROPOFOL N/A 04/10/2020   Procedure: COLONOSCOPY WITH PROPOFOL;  Surgeon: Lucilla Lame, MD;  [facility] ENDOSCOPY;  Service: Endoscopy;  Laterality: N/A;   ESOPHAGOGASTRODUODENOSCOPY (EGD) WITH PROPOFOL N/A 10/23/2015   Procedure: ESOPHAGOGASTRODUODENOSCOPY (EGD) WITH PROPOFOL;  Surgeon: Lucilla Lame, MD;  Location: [facility] ENDOSCOPY;  Service: Endoscopy;  Laterality: N/A;   ESOPHAGOGASTRODUODENOSCOPY (EGD) WITH PROPOFOL N/A 04/10/2020   Procedure: ESOPHAGOGASTRODUODENOSCOPY (EGD) WITH PROPOFOL;  Surgeon: Lucilla Lame, MD;  Location: [facility] ENDOSCOPY;  Service: Endoscopy;  Laterality: N/A;   POLYPECTOMY  04/28/2016   Procedure: POLYPECTOMY;  Surgeon: Lucilla Lame, MD;  Location: [place];  Service: Endoscopy;;   SHOULDER SURGERY Left 2009   TONSILLECTOMY  1965   TUBAL LIGATION  1982   UPPER GI ENDOSCOPY     Dr. Suzette Battiest    Family History  Problem Relation Age of Onset   Breast cancer Mother 70   Diabetes Mother    Gout Mother    Kidney failure Mother    Hypertension Mother    Congestive Heart Failure Mother    Stomach cancer Maternal Aunt    Brain cancer Paternal Uncle        x 2 mat uncles   Heart attack Maternal Grandmother    Hypertension Maternal Grandmother    Stroke Maternal Grandmother    Epilepsy Maternal Aunt     Social History   Tobacco Use   Smoking status: Former    Packs/day: 0.50    Years: 4.00    Total pack years: 2.00    Types: Cigarettes    Start date: 11/24/1976    Quit date: 11/24/1980    Years since quitting: 42.1   Smokeless tobacco: Never  Substance Use Topics   Alcohol use: Yes     Alcohol/week: 0.0 standard drinks of alcohol    Comment: rarely, 1-2x/mo glass of wine     Current Outpatient Medications:    ezetimibe (ZETIA) 10 MG tablet, Take 1 tablet (10 mg total) by mouth daily., Disp: 90 tablet, Rfl: 3   ibuprofen (ADVIL) 400 MG tablet, Take 400 mg by mouth every 6 (six) hours as needed., Disp: , Rfl:    Nerve Stimulator (STANDARD TENS) DEVI, 1 Units by Does not apply route daily., Disp: 1 Device, Rfl: 0   amLODipine (NORVASC)  2.5 MG tablet, Take 1 tablet (2.5 mg total) by mouth daily., Disp: 90 tablet, Rfl: 1   gabapentin (NEURONTIN) 400 MG capsule, Take 1 capsule (400 mg total) by mouth at bedtime., Disp: 90 capsule, Rfl: 1  Allergies  Allergen Reactions   Ace Inhibitors Swelling   Benicar [Olmesartan] Other (See Comments)    Chest Pressure and Allergic Reaction   Pregabalin     weight gain and blurred vision   Spironolactone     Chest pain    Venlafaxine     foggy minded    I personally reviewed active problem list, medication list, allergies, family history, social history, health maintenance with the patient/caregiver today.   ROS  Ten systems reviewed and is negative except as mentioned in HPI   Objective  Vitals:   01/02/23 0927  BP: 134/80  Pulse: 75  Resp: 14  Temp: 98.4 F (36.9 C)  TempSrc: Oral  SpO2: 100%  Weight: 158 lb 1.6 oz (71.7 kg)  Height: 5' (1.524 m)    Body mass index is 30.88 kg/m.  Physical Exam  Constitutional: Patient appears well-developed and well-nourished. Obese  No distress.  HEENT: head atraumatic, normocephalic, pupils equal and reactive to light, neck supple Cardiovascular: Normal rate, regular rhythm and normal heart sounds.  No murmur heard. No BLE edema. Pulmonary/Chest: Effort normal and breath sounds normal. No respiratory distress. Abdominal: Soft.  There is no tenderness. Psychiatric: Patient has a normal mood and affect. behavior is normal. Judgment and thought content normal.     PHQ2/9:    01/02/2023    9:28 AM 12/05/2022    9:56 AM 08/01/2022   10:11 AM 01/30/2022   11:47 AM 01/29/2022    1:08 PM  Depression screen PHQ 2/9  Decreased Interest 1 2 0 0 0  Down, Depressed, Hopeless 1 1 0 0 0  PHQ - 2 Score 2 3 0 0 0  Altered sleeping 0 0 0 0 0  Tired, decreased energy 3 3 3 $ 0 0  Change in appetite 0 0 0 0 0  Feeling bad or failure about yourself  1 1 0 0 0  Trouble concentrating 0 0 1 0 0  Moving slowly or fidgety/restless 1 1 0 0 0  Suicidal thoughts 0 0 0 0 0  PHQ-9 Score 7 8 4 $ 0 0  Difficult doing work/chores Somewhat difficult Somewhat difficult  Not difficult at all Not difficult at all    phq 9 is positive   Fall Risk:    01/02/2023    9:28 AM 01/02/2023    9:14 AM 12/05/2022    9:51 AM 08/01/2022   10:10 AM 01/30/2022   11:47 AM  Fall Risk   Falls in the past year? 1 1 1 1 $ 0  Number falls in past yr: 0 0 0 0 0  Injury with Fall? 1 0 0 1 0  Risk for fall due to : History of fall(s) No Fall Risks No Fall Risks;History of fall(s) No Fall Risks   Follow up Falls prevention discussed;Education provided;Falls evaluation completed Falls prevention discussed Falls prevention discussed;Education provided;Falls evaluation completed Falls prevention discussed       Assessment & Plan  1. Benign essential HTN  - amLODipine (NORVASC) 2.5 MG tablet; Take 1 tablet (2.5 mg total) by mouth daily.  Dispense: 90 tablet; Refill: 1  2. Seasonal affective disorder ([place])  Stable   3. CFIDS (chronic fatigue and immune dysfunction syndrome) (HCC)  Discussed CBT   4. Fibromyalgia  syndrome  - gabapentin (NEURONTIN) 400 MG capsule; Take 1 capsule (400 mg total) by mouth at bedtime.  Dispense: 90 capsule; Refill: 1  5. Obstructive sleep apnea syndrome  Unable to tolerate CPAP  6. Gastroesophageal reflux disease without esophagitis  Doing well   7. Dyslipidemia  - ezetimibe (ZETIA) 10 MG tablet; Take 1 tablet (10 mg total) by mouth daily.  Dispense: 90  tablet; Refill: 3  8. Osteopenia after menopause   Discussed high calcium diet   9. Intermittent dysphagia  She is very worried about missing something that could be happening to her. Discussed referral to neurologist, explained they may be able to do a MRI brain to see if electric shock sensation, dysphagia are related to that   She also has a remote history of migraine and had scans in the past. She is worried about cost of visit and MRI, discussed again to at least try CBT

## 2023-01-02 ENCOUNTER — Encounter: Payer: Self-pay | Admitting: Family Medicine

## 2023-01-02 ENCOUNTER — Ambulatory Visit: Payer: 59 | Admitting: Family Medicine

## 2023-01-02 VITALS — BP 134/80 | HR 75 | Temp 98.4°F | Resp 14 | Ht 60.0 in | Wt 158.1 lb

## 2023-01-02 DIAGNOSIS — I10 Essential (primary) hypertension: Secondary | ICD-10-CM

## 2023-01-02 DIAGNOSIS — M797 Fibromyalgia: Secondary | ICD-10-CM

## 2023-01-02 DIAGNOSIS — R1319 Other dysphagia: Secondary | ICD-10-CM

## 2023-01-02 DIAGNOSIS — F338 Other recurrent depressive disorders: Secondary | ICD-10-CM

## 2023-01-02 DIAGNOSIS — G9332 Myalgic encephalomyelitis/chronic fatigue syndrome: Secondary | ICD-10-CM | POA: Diagnosis not present

## 2023-01-02 DIAGNOSIS — M8580 Other specified disorders of bone density and structure, unspecified site: Secondary | ICD-10-CM

## 2023-01-02 DIAGNOSIS — K219 Gastro-esophageal reflux disease without esophagitis: Secondary | ICD-10-CM

## 2023-01-02 DIAGNOSIS — G4733 Obstructive sleep apnea (adult) (pediatric): Secondary | ICD-10-CM

## 2023-01-02 DIAGNOSIS — Z780 Asymptomatic menopausal state: Secondary | ICD-10-CM

## 2023-01-02 DIAGNOSIS — E785 Hyperlipidemia, unspecified: Secondary | ICD-10-CM

## 2023-01-02 DIAGNOSIS — D8989 Other specified disorders involving the immune mechanism, not elsewhere classified: Secondary | ICD-10-CM

## 2023-01-02 MED ORDER — GABAPENTIN 400 MG PO CAPS: 400.0000 mg | ORAL_CAPSULE | Freq: Every day | ORAL | 1 refills | Status: DC

## 2023-01-02 MED ORDER — EZETIMIBE 10 MG PO TABS: 10.0000 mg | ORAL_TABLET | Freq: Every day | ORAL | 3 refills | Status: DC

## 2023-01-02 MED ORDER — AMLODIPINE BESYLATE 2.5 MG PO TABS: 2.5000 mg | ORAL_TABLET | Freq: Every day | ORAL | 1 refills | Status: DC

## 2023-01-02 NOTE — Patient Instructions (Addendum)
Cognitive Behavioral therapy - CBT

## 2023-01-21 ENCOUNTER — Ambulatory Visit
Admission: RE | Admit: 2023-01-21 | Discharge: 2023-01-21 | Disposition: A | Discharge status: Home / Self Care | Payer: 59 | Source: Ambulatory Visit | Attending: Family Medicine | Admitting: Family Medicine

## 2023-01-21 ENCOUNTER — Encounter: Payer: Self-pay | Admitting: Urgent Care

## 2023-01-21 DIAGNOSIS — Z1231 Encounter for screening mammogram for malignant neoplasm of breast: Secondary | ICD-10-CM | POA: Insufficient documentation

## 2023-07-03 ENCOUNTER — Ambulatory Visit: Payer: 59 | Admitting: Family Medicine

## 2023-07-10 NOTE — Progress Notes (Unsigned)
Name: Deanna Baker   MRN: [id number]    DOB: 1961/11/06   Date:07/13/2023       Progress Note  Subjective  Chief Complaint  Follow Up  HPI  GERD: she had daily symptoms, she stopped PPI on her own because of low B12 she had EGD and colonoscopy 2021 and showed mild hiatal hernia. She denies any dysphagia    HTN: BP is elevated, she stopped taking medications on her own, explained importance of daily  medication use.   Seasonal Affective disorder: She has seasonal affective disorder but did not want to take medications during winter months. She is trying to spend time outdoors this time of the year     FMS/Chronic Fatigue syndrome: she takes Gabapentin 400 mg qhs prn, she takes Tylenol and Ibuprofen prn before she takes Gabapentin.  She states she has pain all over,  but still has constant fatigue and mental fogginess. She has chronic fatigue syndrome she states sometimes gets cervical lymphadenopathy.  She states she feels like she is not in control of her body, sometimes her body feels like her body is moving when she stopped in a traffic light. She states it is affecting her emotionally. Discussed referral to therapist for CBT but not ready yet.    Family history of lupus: her half sister also has similar problems and was diagnosed with lupus, we will check labs.  Vasomotor rhinitis she went  to Dr. Jenne Campus for recurrent clear rhinorrhea and vomiting when showering, had a CT sinus that per patient was negative ( unable to see it) she was given nasal spray but not using it. She still gags when she brushes her teeth and occasionally vomits when she takes a hot shower and causes sinus drainage. She was evaluated by GI and ENT  Unchanged    Dyslipidemia: .she does not want to try statin therapy due to FMS, we gave her Zetia but she has not started taking it yet   The 10-year ASCVD risk score (Arnett DK, et al., 2019) is: 17.9%   Values used to calculate the score:     Age: 33 years     Sex:  Female     Is Non-Hispanic African American: No     Diabetic: No     Tobacco smoker: Yes     Systolic Blood Pressure: 158 mmHg     Is BP treated: Yes     HDL Cholesterol: 58 mg/dL     Total Cholesterol: 253 mg/dL   F62 deficiency: last level was at goal , continue medication, last levels at goal    Vitamin D deficiency: advised to take 2000 units daily   OSA: unable to tolerate CPAP , she still snores and wakes up during the night but she states she sleeps fine.    Patient Active Problem List   Diagnosis Date Noted   Osteopenia after menopause 12/03/2021   Gastroesophageal reflux disease    B12 deficiency 03/29/2018   Cognitive decline 01/02/2017   Smell or taste sensation disturbance 05/12/2016   Personal history of colonic polyps    Benign neoplasm of transverse colon    Rectal polyp    Tenosynovitis of thumb 03/12/2016   Bilateral knee pain 03/12/2016   Stricture and stenosis of esophagus    Thickening of esophagus 08/03/2015   Benign essential HTN 05/01/2015   CFIDS (chronic fatigue and immune dysfunction syndrome) (HCC) 05/01/2015   Major depression, recurrent, chronic (HCC) 05/01/2015   Dyslipidemia 05/01/2015   Fibromyalgia  syndrome 05/01/2015   Eczema intertrigo 05/01/2015   Migraine without aura and without status migrainosus, not intractable 05/01/2015   Excessive urination at night 05/01/2015   Dysmetabolic syndrome 05/01/2015   History of colonoscopy with polypectomy 05/01/2015   Central sleep apnea 05/01/2015   History of back surgery 05/01/2015   Thickened nails 05/01/2015    Past Surgical History:  Procedure Laterality Date   BACK SURGERY     BREAST BIOPSY Left 10/2012   +   BREAST EXCISIONAL BIOPSY Left 11/2012   + 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   COLONOSCOPY  2013   Dr. Tami Lin    COLONOSCOPY WITH PROPOFOL N/A 04/28/2016   Procedure: COLONOSCOPY WITH PROPOFOL;   Surgeon: Midge Minium, MD;  Location: [facility];  Service: Endoscopy;  Laterality: N/A;  CPAP   COLONOSCOPY WITH PROPOFOL N/A 04/10/2020   Procedure: COLONOSCOPY WITH PROPOFOL;  Surgeon: Midge Minium, MD;  Location: [facility] ENDOSCOPY;  Service: Endoscopy;  Laterality: N/A;   ESOPHAGOGASTRODUODENOSCOPY (EGD) WITH PROPOFOL N/A 10/23/2015   Procedure: ESOPHAGOGASTRODUODENOSCOPY (EGD) WITH PROPOFOL;  Surgeon: Midge Minium, MD;  Location: [facility] ENDOSCOPY;  Service: Endoscopy;  Laterality: N/A;   ESOPHAGOGASTRODUODENOSCOPY (EGD) WITH PROPOFOL N/A 04/10/2020   Procedure: ESOPHAGOGASTRODUODENOSCOPY (EGD) WITH PROPOFOL;  Surgeon: Midge Minium, MD;  Location: [facility] ENDOSCOPY;  Service: Endoscopy;  Laterality: N/A;   POLYPECTOMY  04/28/2016   Procedure: POLYPECTOMY;  Surgeon: Midge Minium, MD;  Location: [facility];  Service: Endoscopy;;   SHOULDER SURGERY Left 2009   TONSILLECTOMY  1965   TUBAL LIGATION  1982   UPPER GI ENDOSCOPY     Dr. Tami Lin    Family History  Problem Relation Age of Onset   Breast cancer Mother 72   Diabetes Mother    Gout Mother    Kidney failure Mother    Hypertension Mother    Congestive Heart Failure Mother    Stomach cancer Maternal Aunt    Brain cancer Paternal Uncle        x 2 mat uncles   Heart attack Maternal Grandmother    Hypertension Maternal Grandmother    Stroke Maternal Grandmother    Epilepsy Maternal Aunt     Social History   Tobacco Use   Smoking status: Former    Current packs/day: 0.00    Average packs/day: 0.5 packs/day for 4.0 years (2.0 ttl pk-yrs)    Types: Cigarettes    Start date: 11/24/1976    Quit date: 11/24/1980    Years since quitting: 42.6   Smokeless tobacco: Never  Substance Use Topics   Alcohol use: Yes    Alcohol/week: 0.0 standard drinks of alcohol    Comment: rarely, 1-2x/mo glass of wine     Current Outpatient Medications:    gabapentin (NEURONTIN) 100 MG capsule, Take 1 capsule (100 mg total) by mouth every  morning., Disp: 90 capsule, Rfl: 1   gabapentin (NEURONTIN) 400 MG capsule, Take 1 capsule (400 mg total) by mouth at bedtime., Disp: 90 capsule, Rfl: 1   ibuprofen (ADVIL) 400 MG tablet, Take 400 mg by mouth every 6 (six) hours as needed., Disp: , Rfl:    Nerve Stimulator (STANDARD TENS) DEVI, 1 Units by Does not apply route daily., Disp: 1 Device, Rfl: 0   amLODipine (NORVASC) 2.5 MG tablet, Take 1 tablet (2.5 mg total) by mouth daily. (Patient not taking: Reported on 07/13/2023), Disp: 90 tablet, Rfl: 1  ezetimibe (ZETIA) 10 MG tablet, Take 1 tablet (10 mg total) by mouth daily., Disp: 90 tablet, Rfl: 3  Allergies  Allergen Reactions   Ace Inhibitors Swelling   Benicar [Olmesartan] Other (See Comments)    Chest Pressure and Allergic Reaction   Pregabalin     weight gain and blurred vision   Spironolactone     Chest pain    Venlafaxine     foggy minded    I personally reviewed active problem list, medication list, allergies, family history, social history, health maintenance with the patient/caregiver today.   ROS  Ten systems reviewed and is negative except as mentioned in HPI    Objective  Vitals:   07/13/23 1027 07/13/23 1043  BP: (!) 152/84 (!) 158/80  Pulse: 84   Temp: 98.1 F (36.7 C)   SpO2: 99%   Weight: 159 lb 14.4 oz (72.5 kg)   Height: 5' (1.524 m)     Body mass index is 31.23 kg/m.  Physical Exam  Constitutional: Patient appears well-developed and well-nourished. Obese  No distress.  HEENT: head atraumatic, normocephalic, pupils equal and reactive to light, neck supple Cardiovascular: Normal rate, regular rhythm and normal heart sounds.  No murmur heard. No BLE edema. Pulmonary/Chest: Effort normal and breath sounds normal. No respiratory distress. Abdominal: Soft.  There is no tenderness. Muscular skeletal: trigger point positive  Psychiatric: Patient has a normal mood and affect. behavior is normal. Judgment and thought content normal.    PHQ2/9:    07/13/2023   10:32 AM 01/02/2023    9:28 AM 12/05/2022    9:56 AM 08/01/2022   10:11 AM 01/30/2022   11:47 AM  Depression screen PHQ 2/9  Decreased Interest 0 1 2 0 0  Down, Depressed, Hopeless 0 1 1 0 0  PHQ - 2 Score 0 2 3 0 0  Altered sleeping 0 0 0 0 0  Tired, decreased energy 0 3 3 3  0  Change in appetite 0 0 0 0 0  Feeling bad or failure about yourself  0 1 1 0 0  Trouble concentrating 0 0 0 1 0  Moving slowly or fidgety/restless 0 1 1 0 0  Suicidal thoughts 0 0 0 0 0  PHQ-9 Score 0 7 8 4  0  Difficult doing work/chores Not difficult at all Somewhat difficult Somewhat difficult  Not difficult at all    phq 9 is positive   Fall Risk:    07/13/2023   10:32 AM 01/02/2023    9:28 AM 01/02/2023    9:14 AM 12/05/2022    9:51 AM 08/01/2022   10:10 AM  Fall Risk   Falls in the past year? 0 1 1 1 1   Number falls in past yr: 0 0 0 0 0  Injury with Fall? 0 1 0 0 1  Risk for fall due to :  History of fall(s) No Fall Risks No Fall Risks;History of fall(s) No Fall Risks  Follow up  Falls prevention discussed;Education provided;Falls evaluation completed Falls prevention discussed Falls prevention discussed;Education provided;Falls evaluation completed Falls prevention discussed    Assessment & Plan  1. Fibromyalgia syndrome  - gabapentin (NEURONTIN) 100 MG capsule; Take 1 capsule (100 mg total) by mouth every morning.  Dispense: 90 capsule; Refill: 1  2. Benign essential HTN  Needs to resume medication   3. CFIDS (chronic fatigue and immune dysfunction syndrome) (HCC)  Unchanged   4. Obstructive sleep apnea syndrome  Unable to tolerate CPAP  5. Dyslipidemia  -  ezetimibe (ZETIA) 10 MG tablet; Take 1 tablet (10 mg total) by mouth daily.  Dispense: 90 tablet; Refill: 3  6. B12 deficiency  Continue supplementation   7. Gastroesophageal reflux disease without esophagitis   Stable  8. Family history of lupus erythematosus  - ANA,IFA RA Diag Pnl w/rflx  Tit/Patn - C-reactive protein - Sedimentation rate

## 2023-07-13 ENCOUNTER — Encounter: Payer: Self-pay | Admitting: Family Medicine

## 2023-07-13 ENCOUNTER — Ambulatory Visit: Payer: 59 | Admitting: Family Medicine

## 2023-07-13 VITALS — BP 158/80 | HR 84 | Temp 98.1°F | Ht 60.0 in | Wt 159.9 lb

## 2023-07-13 DIAGNOSIS — I10 Essential (primary) hypertension: Secondary | ICD-10-CM

## 2023-07-13 DIAGNOSIS — E538 Deficiency of other specified B group vitamins: Secondary | ICD-10-CM

## 2023-07-13 DIAGNOSIS — E785 Hyperlipidemia, unspecified: Secondary | ICD-10-CM

## 2023-07-13 DIAGNOSIS — G9332 Myalgic encephalomyelitis/chronic fatigue syndrome: Secondary | ICD-10-CM

## 2023-07-13 DIAGNOSIS — G4733 Obstructive sleep apnea (adult) (pediatric): Secondary | ICD-10-CM

## 2023-07-13 DIAGNOSIS — M797 Fibromyalgia: Secondary | ICD-10-CM

## 2023-07-13 DIAGNOSIS — Z840 Family history of diseases of the skin and subcutaneous tissue: Secondary | ICD-10-CM

## 2023-07-13 DIAGNOSIS — K219 Gastro-esophageal reflux disease without esophagitis: Secondary | ICD-10-CM

## 2023-07-13 DIAGNOSIS — D8989 Other specified disorders involving the immune mechanism, not elsewhere classified: Secondary | ICD-10-CM

## 2023-07-13 MED ORDER — GABAPENTIN 100 MG PO CAPS: 100.0000 mg | ORAL_CAPSULE | ORAL | 1 refills | Status: DC

## 2023-07-13 MED ORDER — EZETIMIBE 10 MG PO TABS: 10.0000 mg | ORAL_TABLET | Freq: Every day | ORAL | 3 refills | Status: DC

## 2023-07-13 NOTE — Patient Instructions (Addendum)
Get Zetia/Ezetimide - it is not a statin drug but will improve your cholesterol levels   Cognitive behavioral therapy

## 2023-07-14 LAB — ANA,IFA RA DIAG PNL W/RFLX TIT/PATN
Anti Nuclear Antibody (ANA): NEGATIVE
Cyclic Citrullin Peptide Ab: <16 U
Rheumatoid fact SerPl-aCnc: <10 [IU]/mL (ref <14)

## 2023-07-14 LAB — C-REACTIVE PROTEIN: CRP: <3 mg/L (ref <8.0)

## 2023-07-14 LAB — SEDIMENTATION RATE: Sed Rate: 17 mm/h (ref 0–30)

## 2023-07-20 ENCOUNTER — Ambulatory Visit: Payer: 59

## 2023-07-20 VITALS — BP 160/90

## 2023-07-20 DIAGNOSIS — I10 Essential (primary) hypertension: Secondary | ICD-10-CM

## 2023-07-20 NOTE — Progress Notes (Signed)
Patient presented for blood pressure check. Reading obtained in right arm/large cuff: 160/90 Physician notified.   Discussed with patient, we will adjust norvasc to 5 mg daily, she will take one pill in am and one at night for now Reminded her of possible edema as side effects  She will send me a log of her readings and we may need to add ARB if bp remains elevated

## 2023-10-26 ENCOUNTER — Other Ambulatory Visit: Payer: Self-pay | Admitting: Family Medicine

## 2023-10-26 DIAGNOSIS — M797 Fibromyalgia: Secondary | ICD-10-CM

## 2023-10-26 NOTE — Telephone Encounter (Signed)
Pt has been taking the 100 mg 1-2 pills(once in a while, depending on pain) a day and the 400 mg at bedtime.

## 2023-11-02 NOTE — Progress Notes (Signed)
Name: Deanna Baker   MRN: [id number]    DOB: 08-Feb-1961   Date:11/16/2023       Progress Note  Subjective  Chief Complaint  Chief Complaint  Patient presents with   Medical Management of Chronic Issues    HPI  GERD: she had daily symptoms, she stopped PPI on her own because of low B12 she had EGD and colonoscopy 2021 and showed mild hiatal hernia. She denies any dysphagia . Doing well    HTN: BP is elevated, she stopped taking medications on her own prior to last visit , she is now taking Norvasc 2.5 mg we will adjust dose to 5 mg daily. She denies chest pain or palpitation   Seasonal Affective disorder: She has seasonal affective disorder but did not want to take medications during winter months. Discussed UV light therapy. She does not want to add medication     FMS/Chronic Fatigue syndrome: she takes Gabapentin 400 mg qhs prn, she takes Tylenol and Ibuprofen prn before she takes Gabapentin.  She states she has pain all over,  but still has constant fatigue and mental fogginess. She has chronic fatigue syndrome she states sometimes gets cervical lymphadenopathy, it happened yesterday afternoon but feeling better today.  She states it is affecting her emotionally. She has not seen a therapist   Vasomotor rhinitis she went  to Dr. Jenne Campus for recurrent clear rhinorrhea and vomiting when showering, had a CT sinus that per patient was negative ( unable to see it) she was given nasal spray but not using it. She still gags when she brushes her teeth and occasionally vomits when she takes a hot shower and causes sinus drainage. She was evaluated by GI and ENT  Unchanged    Dyslipidemia: .she does not want to try statin therapy due to FMS, we gave her Zetia and she is now taking it daily and will return for labs    The 10-year ASCVD risk score (Arnett DK, et al., 2019) is: 10%   Values used to calculate the score:     Age: 62 years     Sex: Female     Is Non-Hispanic African American: No      Diabetic: No     Tobacco smoker: No     Systolic Blood Pressure: 156 mmHg     Is BP treated: Yes     HDL Cholesterol: 58 mg/dL     Total Cholesterol: 253 mg/dL    W09 deficiency: last level was at goal , continue medication, we will recheck labs    Vitamin D deficiency: advised to take 2000 units daily , we will recheck labs    OSA: unable to tolerate CPAP , she still snores and wakes up during the night but she states she sleeps fine. We will recheck CBC  Patient Active Problem List   Diagnosis Date Noted   Osteopenia after menopause 12/03/2021   Gastroesophageal reflux disease    B12 deficiency 03/29/2018   Cognitive decline 01/02/2017   Smell or taste sensation disturbance 05/12/2016   History of colonic polyps    Benign neoplasm of transverse colon    Rectal polyp    Tenosynovitis of thumb 03/12/2016   Bilateral knee pain 03/12/2016   Stricture and stenosis of esophagus    Thickening of esophagus 08/03/2015   Benign essential HTN 05/01/2015   CFIDS (chronic fatigue and immune dysfunction syndrome) (HCC) 05/01/2015   Major depression, recurrent, chronic (HCC) 05/01/2015   Dyslipidemia 05/01/2015   Fibromyalgia  syndrome 05/01/2015   Eczema intertrigo 05/01/2015   Migraine without aura and without status migrainosus, not intractable 05/01/2015   Excessive urination at night 05/01/2015   Dysmetabolic syndrome 05/01/2015   History of colonoscopy with polypectomy 05/01/2015   Central sleep apnea 05/01/2015   History of back surgery 05/01/2015   Thickened nails 05/01/2015    Past Surgical History:  Procedure Laterality Date   BACK SURGERY     BREAST BIOPSY Left 10/2012   +   BREAST EXCISIONAL BIOPSY Left 11/2012   + 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   COLONOSCOPY  2013   Dr. Tami Lin    COLONOSCOPY WITH PROPOFOL N/A 04/28/2016   Procedure: COLONOSCOPY WITH PROPOFOL;  Surgeon: Midge Minium,  MD;  Location: [facility];  Service: Endoscopy;  Laterality: N/A;  CPAP   COLONOSCOPY WITH PROPOFOL N/A 04/10/2020   Procedure: COLONOSCOPY WITH PROPOFOL;  Surgeon: Midge Minium, MD;  Location: [facility] ENDOSCOPY;  Service: Endoscopy;  Laterality: N/A;   ESOPHAGOGASTRODUODENOSCOPY (EGD) WITH PROPOFOL N/A 10/23/2015   Procedure: ESOPHAGOGASTRODUODENOSCOPY (EGD) WITH PROPOFOL;  Surgeon: Midge Minium, MD;  Location: [facility] ENDOSCOPY;  Service: Endoscopy;  Laterality: N/A;   ESOPHAGOGASTRODUODENOSCOPY (EGD) WITH PROPOFOL N/A 04/10/2020   Procedure: ESOPHAGOGASTRODUODENOSCOPY (EGD) WITH PROPOFOL;  Surgeon: Midge Minium, MD;  Location: [facility] ENDOSCOPY;  Service: Endoscopy;  Laterality: N/A;   POLYPECTOMY  04/28/2016   Procedure: POLYPECTOMY;  Surgeon: Midge Minium, MD;  Location: [facility];  Service: Endoscopy;;   SHOULDER SURGERY Left 2009   TONSILLECTOMY  1965   TUBAL LIGATION  1982   UPPER GI ENDOSCOPY     Dr. Tami Lin    Family History  Problem Relation Age of Onset   Breast cancer Mother 82   Diabetes Mother    Gout Mother    Kidney failure Mother    Hypertension Mother    Congestive Heart Failure Mother    Stomach cancer Maternal Aunt    Brain cancer Paternal Uncle        x 2 mat uncles   Heart attack Maternal Grandmother    Hypertension Maternal Grandmother    Stroke Maternal Grandmother    Epilepsy Maternal Aunt     Social History   Tobacco Use   Smoking status: Former    Current packs/day: 0.00    Average packs/day: 0.5 packs/day for 4.0 years (2.0 ttl pk-yrs)    Types: Cigarettes    Start date: 11/24/1976    Quit date: 11/24/1980    Years since quitting: 43.0   Smokeless tobacco: Never  Substance Use Topics   Alcohol use: Yes    Alcohol/week: 0.0 standard drinks of alcohol    Comment: rarely, 1-2x/mo glass of wine     Current Outpatient Medications:    ezetimibe (ZETIA) 10 MG tablet, Take 1 tablet (10 mg total) by mouth daily., Disp: 90 tablet, Rfl: 3    gabapentin (NEURONTIN) 400 MG capsule, TAKE 1 CAPSULE BY MOUTH AT BEDTIME., Disp: 90 capsule, Rfl: 1   ibuprofen (ADVIL) 400 MG tablet, Take 400 mg by mouth every 6 (six) hours as needed., Disp: , Rfl:    Nerve Stimulator (STANDARD TENS) DEVI, 1 Units by Does not apply route daily., Disp: 1 Device, Rfl: 0   amLODipine (NORVASC) 5 MG tablet, Take 1 tablet (5 mg total) by mouth daily., Disp: 90 tablet, Rfl: 1   gabapentin (NEURONTIN) 100 MG capsule, Take 1 capsule (100 mg  total) by mouth 2 (two) times daily., Disp: 180 capsule, Rfl: 1  Allergies  Allergen Reactions   Ace Inhibitors Swelling   Benicar [Olmesartan] Other (See Comments)    Chest Pressure and Allergic Reaction   Pregabalin     weight gain and blurred vision   Spironolactone     Chest pain    Venlafaxine     foggy minded    I personally reviewed active problem list, medication list, allergies, family history with the patient/caregiver today.   ROS  Ten systems reviewed and is negative except as mentioned in HPI    Objective  Vitals:   11/16/23 0931 11/16/23 0957  BP: (!) 166/94 (!) 156/84  Pulse: 72   Resp: 16   Temp: 97.9 F (36.6 C)   TempSrc: Oral   SpO2: 99%   Weight: 163 lb 8 oz (74.2 kg)   Height: 5' (1.524 m)     Body mass index is 31.93 kg/m.  Physical Exam  Constitutional: Patient appears well-developed and well-nourished. Obese  No distress.  HEENT: head atraumatic, normocephalic, pupils equal and reactive to light, neck supple, throat within normal limits Cardiovascular: Normal rate, regular rhythm and normal heart sounds.  No murmur heard. No BLE edema. Pulmonary/Chest: Effort normal and breath sounds normal. No respiratory distress. Abdominal: Soft.  There is no tenderness. Psychiatric: Patient has a normal mood and affect. behavior is normal. Judgment and thought content normal.    PHQ2/9:    11/16/2023    9:30 AM 07/13/2023   10:32 AM 01/02/2023    9:28 AM 12/05/2022    9:56 AM 08/01/2022    10:11 AM  Depression screen PHQ 2/9  Decreased Interest 0 0 1 2 0  Down, Depressed, Hopeless 0 0 1 1 0  PHQ - 2 Score 0 0 2 3 0  Altered sleeping 0 0 0 0 0  Tired, decreased energy 0 0 3 3 3   Change in appetite 0 0 0 0 0  Feeling bad or failure about yourself  0 0 1 1 0  Trouble concentrating 0 0 0 0 1  Moving slowly or fidgety/restless 0 0 1 1 0  Suicidal thoughts 0 0 0 0 0  PHQ-9 Score 0 0 7 8 4   Difficult doing work/chores Not difficult at all Not difficult at all Somewhat difficult Somewhat difficult     phq 9 is negative   Fall Risk:    11/16/2023    9:26 AM 07/13/2023   10:32 AM 01/02/2023    9:28 AM 01/02/2023    9:14 AM 12/05/2022    9:51 AM  Fall Risk   Falls in the past year? 0 0 1 1 1   Number falls in past yr: 0 0 0 0 0  Injury with Fall? 0 0 1 0 0  Risk for fall due to : No Fall Risks  History of fall(s) No Fall Risks No Fall Risks;History of fall(s)  Follow up Falls prevention discussed;Education provided;Falls evaluation completed  Falls prevention discussed;Education provided;Falls evaluation completed Falls prevention discussed Falls prevention discussed;Education provided;Falls evaluation completed      Assessment & Plan  1. CFIDS (chronic fatigue and immune dysfunction syndrome) (HCC) (Primary)  Recent flare this weekend   2. Seasonal affective disorder (HCC)  Discussed UV light   3. Obstructive sleep apnea syndrome  Cannot tolerate CPAP  4. Benign essential HTN  - amLODipine (NORVASC) 5 MG tablet; Take 1 tablet (5 mg total) by mouth daily.  Dispense: 90 tablet;  Refill: 1 - CBC with Differential/Platelet - COMPLETE METABOLIC PANEL WITH GFR  5. Dyslipidemia  - Lipid panel  6. Fibromyalgia syndrome  - gabapentin (NEURONTIN) 100 MG capsule; Take 1 capsule (100 mg total) by mouth 2 (two) times daily.  Dispense: 180 capsule; Refill: 1  7. Gastroesophageal reflux disease without esophagitis  Doing well   8. B12 deficiency  - CBC with  Differential/Platelet - B12 and Folate Panel  9. Osteopenia after menopause  - COMPLETE METABOLIC PANEL WITH GFR - VITAMIN D 25 Hydroxy (Vit-D Deficiency, Fractures)  10. Intermittent dysphagia  resolved  11. Hyperglycemia  - Hemoglobin A1c

## 2023-11-16 ENCOUNTER — Ambulatory Visit: Payer: 59 | Admitting: Family Medicine

## 2023-11-16 ENCOUNTER — Encounter: Payer: Self-pay | Admitting: Family Medicine

## 2023-11-16 VITALS — BP 156/84 | HR 72 | Temp 97.9°F | Resp 16 | Ht 60.0 in | Wt 163.5 lb

## 2023-11-16 DIAGNOSIS — G4733 Obstructive sleep apnea (adult) (pediatric): Secondary | ICD-10-CM | POA: Diagnosis not present

## 2023-11-16 DIAGNOSIS — E785 Hyperlipidemia, unspecified: Secondary | ICD-10-CM

## 2023-11-16 DIAGNOSIS — G9332 Myalgic encephalomyelitis/chronic fatigue syndrome: Secondary | ICD-10-CM | POA: Diagnosis not present

## 2023-11-16 DIAGNOSIS — Z780 Asymptomatic menopausal state: Secondary | ICD-10-CM

## 2023-11-16 DIAGNOSIS — F338 Other recurrent depressive disorders: Secondary | ICD-10-CM | POA: Diagnosis not present

## 2023-11-16 DIAGNOSIS — M8580 Other specified disorders of bone density and structure, unspecified site: Secondary | ICD-10-CM

## 2023-11-16 DIAGNOSIS — E538 Deficiency of other specified B group vitamins: Secondary | ICD-10-CM

## 2023-11-16 DIAGNOSIS — K219 Gastro-esophageal reflux disease without esophagitis: Secondary | ICD-10-CM

## 2023-11-16 DIAGNOSIS — D8989 Other specified disorders involving the immune mechanism, not elsewhere classified: Secondary | ICD-10-CM

## 2023-11-16 DIAGNOSIS — R739 Hyperglycemia, unspecified: Secondary | ICD-10-CM

## 2023-11-16 DIAGNOSIS — M797 Fibromyalgia: Secondary | ICD-10-CM

## 2023-11-16 DIAGNOSIS — I10 Essential (primary) hypertension: Secondary | ICD-10-CM

## 2023-11-16 DIAGNOSIS — R1319 Other dysphagia: Secondary | ICD-10-CM

## 2023-11-16 MED ORDER — AMLODIPINE BESYLATE 5 MG PO TABS: 5.0000 mg | ORAL_TABLET | Freq: Every day | ORAL | 1 refills | Status: DC

## 2023-11-16 MED ORDER — GABAPENTIN 100 MG PO CAPS: 100.0000 mg | ORAL_CAPSULE | Freq: Two times a day (BID) | ORAL | 1 refills | Status: DC

## 2023-11-16 MED ORDER — GABAPENTIN 100 MG PO CAPS: 100.0000 mg | ORAL_CAPSULE | ORAL | 1 refills | Status: DC

## 2023-11-24 ENCOUNTER — Ambulatory Visit: Payer: 59

## 2023-11-24 NOTE — Progress Notes (Signed)
 Patient is in office today for a nurse visit for Blood Pressure Check. Patient blood pressure was 138/84, Patient No chest pain, No shortness of breath, No dyspnea on exertion, No orthopnea, No paroxysmal nocturnal dyspnea, No edema, No palpitations, No syncope.  Per Dr. Glenard last visit:HTN: BP is elevated, she stopped taking medications on her own prior to last visit , she is now taking Norvasc  2.5 mg we will adjust dose to 5 mg daily. She denies chest pain or palpitation

## 2023-11-29 ENCOUNTER — Encounter: Payer: Self-pay | Admitting: Family Medicine

## 2023-12-08 ENCOUNTER — Encounter: Payer: 59 | Admitting: Family Medicine

## 2024-01-12 ENCOUNTER — Encounter: Payer: Self-pay | Admitting: Family Medicine

## 2024-01-12 LAB — COMPLETE METABOLIC PANEL WITH GFR
AG Ratio: 1.9 (calc) (ref 1.0–2.5)
ALT: 14 U/L (ref 6–29)
AST: 16 U/L (ref 10–35)
Albumin: 4.4 g/dL (ref 3.6–5.1)
Alkaline phosphatase (APISO): 113 U/L (ref 37–153)
BUN: 11 mg/dL (ref 7–25)
CO2: 30 mmol/L (ref 20–32)
Calcium: 9.6 mg/dL (ref 8.6–10.4)
Chloride: 105 mmol/L (ref 98–110)
Creat: 0.79 mg/dL (ref 0.50–1.05)
Globulin: 2.3 g/dL (ref 1.9–3.7)
Glucose, Bld: 104 mg/dL — ABNORMAL HIGH (ref 65–99)
Potassium: 4.6 mmol/L (ref 3.5–5.3)
Sodium: 143 mmol/L (ref 135–146)
Total Bilirubin: 0.6 mg/dL (ref 0.2–1.2)
Total Protein: 6.7 g/dL (ref 6.1–8.1)
eGFR: 84 mL/min/{1.73_m2} (ref >=60)

## 2024-01-12 LAB — B12 AND FOLATE PANEL
Folate: 10.6 ng/mL
Vitamin B-12: 425 pg/mL (ref 200–1100)

## 2024-01-12 LAB — CBC WITH DIFFERENTIAL/PLATELET
Absolute Lymphocytes: 1087 {cells}/uL (ref 850–3900)
Absolute Monocytes: 345 {cells}/uL (ref 200–950)
Basophils Absolute: 11 {cells}/uL (ref 0–200)
Basophils Relative: 0.2 %
Eosinophils Absolute: 101 {cells}/uL (ref 15–500)
Eosinophils Relative: 1.9 %
HCT: 40.4 % (ref 35.0–45.0)
Hemoglobin: 13.3 g/dL (ref 11.7–15.5)
MCH: 29.5 pg (ref 27.0–33.0)
MCHC: 32.9 g/dL (ref 32.0–36.0)
MCV: 89.6 fL (ref 80.0–100.0)
MPV: 10.8 fL (ref 7.5–12.5)
Monocytes Relative: 6.5 %
Neutro Abs: 3758 {cells}/uL (ref 1500–7800)
Neutrophils Relative %: 70.9 %
Platelets: 223 10*3/uL (ref 140–400)
RBC: 4.51 10*6/uL (ref 3.80–5.10)
RDW: 12.7 % (ref 11.0–15.0)
Total Lymphocyte: 20.5 %
WBC: 5.3 10*3/uL (ref 3.8–10.8)

## 2024-01-12 LAB — LIPID PANEL
Cholesterol: 225 mg/dL — ABNORMAL HIGH (ref <200)
HDL: 61 mg/dL (ref >=50)
LDL Cholesterol (Calc): 132 mg/dL — ABNORMAL HIGH
Non-HDL Cholesterol (Calc): 164 mg/dL — ABNORMAL HIGH (ref <130)
Total CHOL/HDL Ratio: 3.7 (calc) (ref <5.0)
Triglycerides: 180 mg/dL — ABNORMAL HIGH (ref <150)

## 2024-01-12 LAB — HEMOGLOBIN A1C
Hgb A1c MFr Bld: 5.6 %{Hb} (ref <5.7)
Mean Plasma Glucose: 114 mg/dL
eAG (mmol/L): 6.3 mmol/L

## 2024-01-12 LAB — VITAMIN D 25 HYDROXY (VIT D DEFICIENCY, FRACTURES): Vit D, 25-Hydroxy: 29 ng/mL — ABNORMAL LOW (ref 30–100)

## 2024-01-18 ENCOUNTER — Encounter: Payer: Self-pay | Admitting: Urgent Care

## 2024-01-25 ENCOUNTER — Ambulatory Visit (INDEPENDENT_AMBULATORY_CARE_PROVIDER_SITE_OTHER): Payer: 59 | Admitting: Family Medicine

## 2024-01-25 ENCOUNTER — Other Ambulatory Visit: Payer: Self-pay | Admitting: Family Medicine

## 2024-01-25 ENCOUNTER — Encounter: Payer: Self-pay | Admitting: Family Medicine

## 2024-01-25 VITALS — BP 156/80 | HR 70 | Temp 97.8°F | Resp 16 | Ht 60.0 in | Wt 161.7 lb

## 2024-01-25 DIAGNOSIS — I10 Essential (primary) hypertension: Secondary | ICD-10-CM | POA: Diagnosis not present

## 2024-01-25 DIAGNOSIS — Z0000 Encounter for general adult medical examination without abnormal findings: Secondary | ICD-10-CM

## 2024-01-25 DIAGNOSIS — Z1231 Encounter for screening mammogram for malignant neoplasm of breast: Secondary | ICD-10-CM

## 2024-01-25 DIAGNOSIS — E785 Hyperlipidemia, unspecified: Secondary | ICD-10-CM

## 2024-01-25 DIAGNOSIS — D229 Melanocytic nevi, unspecified: Secondary | ICD-10-CM

## 2024-01-25 MED ORDER — HYDROCHLOROTHIAZIDE 12.5 MG PO TABS: 12.5000 mg | ORAL_TABLET | Freq: Every day | ORAL | 0 refills | Status: DC

## 2024-01-25 MED ORDER — NEXLIZET 180-10 MG PO TABS: 1.0000 | ORAL_TABLET | Freq: Every day | ORAL | 1 refills | Status: DC

## 2024-01-25 NOTE — Telephone Encounter (Signed)
 Will work on [employer]

## 2024-01-25 NOTE — Progress Notes (Signed)
 Name: Deanna Baker   MRN: [id number]    DOB: Feb 14, 1961   Date:01/25/2024       Progress Note  Subjective  Chief Complaint  Chief Complaint  Patient presents with   Annual Exam    HPI  Patient presents for annual CPE and discuss HTN  HTN: she is taking norvasc 5 mg daily but bp has still been mostly in the 150/high 80's, sometimes spikes to 170's, the lowest around 130's. Discussed adding hydrochlorothiazide 12.5 mg in am, norvasc at night. Denies chest pain or palpitation.   Hyperlipidemia: taking Zetia and LDL is not at goal , discussed Nexlizet and she is willing to try it. Cannot take statin due to muscle aches   Diet: balanced diet , cooks at home Exercise:  she has chronic fatigue syndrome and also FMS and she exercises when able to go  Last Eye Exam: she will scheduled it  Last Dental Exam: completed  Flowsheet Row Office Visit from 01/25/2024 in [facility]  AUDIT-C Score 1      Depression: Phq 9 is  negative    01/25/2024   10:03 AM 11/16/2023    9:30 AM 07/13/2023   10:32 AM 01/02/2023    9:28 AM 12/05/2022    9:56 AM  Depression screen PHQ 2/9  Decreased Interest 0 0 0 1 2  Down, Depressed, Hopeless 0 0 0 1 1  PHQ - 2 Score 0 0 0 2 3  Altered sleeping 0 0 0 0 0  Tired, decreased energy 0 0 0 3 3  Change in appetite 0 0 0 0 0  Feeling bad or failure about yourself  0 0 0 1 1  Trouble concentrating 0 0 0 0 0  Moving slowly or fidgety/restless 0 0 0 1 1  Suicidal thoughts 0 0 0 0 0  PHQ-9 Score 0 0 0 7 8  Difficult doing work/chores Not difficult at all Not difficult at all Not difficult at all Somewhat difficult Somewhat difficult   Hypertension: BP Readings from Last 3 Encounters:  01/25/24 (!) 156/80  11/16/23 (!) 156/84  07/20/23 (!) 160/90   Obesity: Wt Readings from Last 3 Encounters:  01/25/24 161 lb 11.2 oz (73.3 kg)  11/16/23 163 lb 8 oz (74.2 kg)  07/13/23 159 lb 14.4 oz (72.5 kg)   BMI Readings from Last 3  Encounters:  01/25/24 31.58 kg/m  11/16/23 31.93 kg/m  07/13/23 31.23 kg/m     Vaccines: reviewed with the patient. Discussed shingles, PCV, flu - she refuses   Hep C Screening: completed STD testing and prevention (HIV/chl/gon/syphilis): N/A Intimate partner violence: negative screen  Sexual History : not sexually active, husband has medical problems Menstrual History/LMP/Abnormal Bleeding: post-menopausal Discussed importance of follow up if any post-menopausal bleeding: yes  Incontinence Symptoms: she has stress and urge incontinence symptoms but not daily , sporadic   Breast cancer:  - Last Mammogram: she will schedule it  - BRCA gene screening:patient had breast cancer 2014 - not sure if genetically tested - states daughter that is 76 refuses to get mammograms  Osteoporosis Prevention : Discussed high calcium and vitamin D supplementation, weight bearing exercises Bone density :she wants to wait until age 63 yo   Cervical cancer screening: up-to-date  Skin cancer: Discussed monitoring for atypical lesions  Colorectal cancer: up to date    Lung cancer:  Low Dose CT Chest recommended if Age 52-80 years, 20 pack-year currently smoking OR have quit w/in 15years. Patient  does not qualify for screen   ECG: 2019 we will recheck in one month   Advanced Care Planning: A voluntary discussion about advance care planning including the explanation and discussion of advance directives.  Discussed health care proxy and Living will, and the patient was able to identify a health care proxy as husband.  Patient does have a living will and power of attorney of health care   Patient Active Problem List   Diagnosis Date Noted   Osteopenia after menopause 12/03/2021   Gastroesophageal reflux disease    B12 deficiency 03/29/2018   Cognitive decline 01/02/2017   Smell or taste sensation disturbance 05/12/2016   History of colonic polyps    Benign neoplasm of transverse colon    Rectal polyp     Tenosynovitis of thumb 03/12/2016   Bilateral knee pain 03/12/2016   Stricture and stenosis of esophagus    Thickening of esophagus 08/03/2015   Benign essential HTN 05/01/2015   CFIDS (chronic fatigue and immune dysfunction syndrome) (HCC) 05/01/2015   Major depression, recurrent, chronic (HCC) 05/01/2015   Dyslipidemia 05/01/2015   Fibromyalgia syndrome 05/01/2015   Eczema intertrigo 05/01/2015   Migraine without aura and without status migrainosus, not intractable 05/01/2015   Excessive urination at night 05/01/2015   Dysmetabolic syndrome 05/01/2015   History of colonoscopy with polypectomy 05/01/2015   Central sleep apnea 05/01/2015   History of back surgery 05/01/2015   Thickened nails 05/01/2015    Past Surgical History:  Procedure Laterality Date   BACK SURGERY     BREAST BIOPSY Left 10/2012   +   BREAST EXCISIONAL BIOPSY Left 11/2012   + 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   COLONOSCOPY  2013   Dr. Tami Lin    COLONOSCOPY WITH PROPOFOL N/A 04/28/2016   Procedure: COLONOSCOPY WITH PROPOFOL;  Surgeon: Midge Minium, MD;  Location: [facility];  Service: Endoscopy;  Laterality: N/A;  CPAP   COLONOSCOPY WITH PROPOFOL N/A 04/10/2020   Procedure: COLONOSCOPY WITH PROPOFOL;  Surgeon: Midge Minium, MD;  Location: [facility] ENDOSCOPY;  Service: Endoscopy;  Laterality: N/A;   ESOPHAGOGASTRODUODENOSCOPY (EGD) WITH PROPOFOL N/A 10/23/2015   Procedure: ESOPHAGOGASTRODUODENOSCOPY (EGD) WITH PROPOFOL;  Surgeon: Midge Minium, MD;  Location: [facility] ENDOSCOPY;  Service: Endoscopy;  Laterality: N/A;   ESOPHAGOGASTRODUODENOSCOPY (EGD) WITH PROPOFOL N/A 04/10/2020   Procedure: ESOPHAGOGASTRODUODENOSCOPY (EGD) WITH PROPOFOL;  Surgeon: Midge Minium, MD;  Location: [facility] ENDOSCOPY;  Service: Endoscopy;  Laterality: N/A;   POLYPECTOMY  04/28/2016   Procedure: POLYPECTOMY;  Surgeon: Midge Minium, MD;  Location: [facility];  Service: Endoscopy;;   SHOULDER SURGERY Left 2009   TONSILLECTOMY  1965   TUBAL LIGATION  1982   UPPER GI ENDOSCOPY     Dr. Tami Lin    Family History  Problem Relation Age of Onset   Breast cancer Mother 56   Diabetes Mother    Gout Mother    Kidney failure Mother    Hypertension Mother    Congestive Heart Failure Mother    Stomach cancer Maternal Aunt    Brain cancer Paternal Uncle        x 2 mat uncles   Heart attack Maternal Grandmother    Hypertension Maternal Grandmother    Stroke Maternal Grandmother    Epilepsy Maternal Aunt     Social History   Socioeconomic History   Marital status: Married    Spouse name: Marcial Pacas   Number  of children: 1   Years of education: Not on file   Highest education level: GED or equivalent  Occupational History   Occupation: Unemployed  Tobacco Use   Smoking status: Former    Current packs/day: 0.00    Average packs/day: 0.5 packs/day for 4.0 years (2.0 ttl pk-yrs)    Types: Cigarettes    Start date: 11/24/1976    Quit date: 11/24/1980    Years since quitting: 43.1   Smokeless tobacco: Never  Vaping Use   Vaping status: Never Used  Substance and Sexual Activity   Alcohol use: Yes    Alcohol/week: 0.0 standard drinks of alcohol    Comment: rarely, 1-2x/mo glass of wine   Drug use: No   Sexual activity: Not Currently    Partners: Male    Birth control/protection: Post-menopausal  Other Topics Concern   Not on file  Social History Narrative   Not on file   Social Drivers of Health   Financial Resource Strain: Low Risk  (01/25/2024)   Overall Financial Resource Strain (CARDIA)    Difficulty of Paying Living Expenses: Not hard at all  Food Insecurity: No Food Insecurity (01/25/2024)   Hunger Vital Sign    Worried About Running Out of Food in the Last Year: Never true    Ran Out of Food in the Last Year: Never true  Transportation Needs: Unmet Transportation Needs (01/25/2024)   PRAPARE - Transportation    Lack of  Transportation (Medical): Yes    Lack of Transportation (Non-Medical): Yes  Physical Activity: Sufficiently Active (01/25/2024)   Exercise Vital Sign    Days of Exercise per Week: 5 days    Minutes of Exercise per Session: 30 min  Stress: No Stress Concern Present (01/25/2024)   [employer] of Occupational Health - Occupational Stress Questionnaire    Feeling of Stress : Only a little  Social Connections: Socially Integrated (01/25/2024)   Social Connection and Isolation Panel [NHANES]    Frequency of Communication with Friends and Family: More than three times a week    Frequency of Social Gatherings with Friends and Family: More than three times a week    Attends Religious Services: More than 4 times per year    Active Member of [employer] or Organizations: Yes    Attends Engineer, structural: More than 4 times per year    Marital Status: Married  Catering manager Violence: Not At Risk (01/25/2024)   Humiliation, Afraid, Rape, and Kick questionnaire    Fear of Current or Ex-Partner: No    Emotionally Abused: No    Physically Abused: No    Sexually Abused: No     Current Outpatient Medications:    amLODipine (NORVASC) 5 MG tablet, Take 1 tablet (5 mg total) by mouth daily., Disp: 90 tablet, Rfl: 1   ezetimibe (ZETIA) 10 MG tablet, Take 1 tablet (10 mg total) by mouth daily., Disp: 90 tablet, Rfl: 3   gabapentin (NEURONTIN) 100 MG capsule, Take 1 capsule (100 mg total) by mouth 2 (two) times daily., Disp: 180 capsule, Rfl: 1   gabapentin (NEURONTIN) 400 MG capsule, TAKE 1 CAPSULE BY MOUTH AT BEDTIME., Disp: 90 capsule, Rfl: 1   ibuprofen (ADVIL) 400 MG tablet, Take 400 mg by mouth every 6 (six) hours as needed., Disp: , Rfl:    Nerve Stimulator (STANDARD TENS) DEVI, 1 Units by Does not apply route daily., Disp: 1 Device, Rfl: 0  Allergies  Allergen Reactions   Ace Inhibitors Swelling  Benicar [Olmesartan] Other (See Comments)    Chest Pressure and Allergic Reaction    Pregabalin     weight gain and blurred vision   Spironolactone     Chest pain    Venlafaxine     foggy minded     ROS  Constitutional: Negative for fever or weight change.  Respiratory: Negative for cough and shortness of breath.   Cardiovascular: Negative for chest pain or palpitations.  Gastrointestinal: Negative for abdominal pain, no bowel changes.  Musculoskeletal: Negative for gait problem or joint swelling.  Skin: Negative for rash.  Neurological: Negative for dizziness or headache.  No other specific complaints in a complete review of systems (except as listed in HPI above).   Objective  Vitals:   01/25/24 1004  BP: (!) 156/80  Pulse: 70  Resp: 16  Temp: 97.8 F (36.6 C)  TempSrc: Oral  SpO2: 99%  Weight: 161 lb 11.2 oz (73.3 kg)  Height: 5' (1.524 m)    Body mass index is 31.58 kg/m.  Physical Exam  Constitutional: Patient appears well-developed and well-nourished.Obese  No distress.  HENT: Head: Normocephalic and atraumatic. Ears: B TMs ok, no erythema or effusion; Nose: Nose normal. Mouth/Throat: Oropharynx is clear and moist. No oropharyngeal exudate.  Eyes: Conjunctivae and EOM are normal. Pupils are equal, round, and reactive to light. No scleral icterus.  Neck: Normal range of motion. Neck supple. No JVD present. No thyromegaly present.  Cardiovascular: Normal rate, regular rhythm and normal heart sounds.  No murmur heard. No BLE edema. Pulmonary/Chest: Effort normal and breath sounds normal. No respiratory distress. Abdominal: Soft. Bowel sounds are normal, no distension. There is no tenderness. no masses Breast: no lumps or masses, no nipple discharge or rashes FEMALE GENITALIA:  Not done  RECTAL: not done  Musculoskeletal: Normal range of motion, no joint effusions. No gross deformities Neurological: he is alert and oriented to person, place, and time. No cranial nerve deficit. Coordination, balance, strength, speech and gait are normal.  Skin:  Skin is warm and dry. No rash noted. No erythema. Atypical mole on left upper back, picture attached Psychiatric: Patient has a normal mood and affect. behavior is normal. Judgment and thought content normal.     Assessment & Plan  1. Well adult exam (Primary)   2. Encounter for screening mammogram for malignant neoplasm of breast  - MM 3D SCREENING MAMMOGRAM BILATERAL BREAST; Future  3. Benign essential HTN  - hydrochlorothiazide (HYDRODIURIL) 12.5 MG tablet; Take 1 tablet (12.5 mg total) by mouth daily.  Dispense: 30 tablet; Refill: 0 - continue Amlodipine  5 mg at night   4. Dyslipidemia  - Bempedoic Acid-Ezetimibe (NEXLIZET) 180-10 MG TABS; Take 1 tablet by mouth daily at 12 noon.  Dispense: 90 tablet; Refill: 1   5. Atypical mole   She will return for a shave biopsy   -USPSTF grade A and B recommendations reviewed with patient; age-appropriate recommendations, preventive care, screening tests, etc discussed and encouraged; healthy living encouraged; see AVS for patient education given to patient -Discussed importance of 150 minutes of physical activity weekly, eat two servings of fish weekly, eat one serving of tree nuts ( cashews, pistachios, pecans, almonds.Marland Kitchen) every other day, eat 6 servings of fruit/vegetables daily and drink plenty of water and avoid sweet beverages.   -Reviewed Health Maintenance: Yes.

## 2024-01-26 ENCOUNTER — Other Ambulatory Visit: Payer: Self-pay | Admitting: Family Medicine

## 2024-01-26 DIAGNOSIS — E785 Hyperlipidemia, unspecified: Secondary | ICD-10-CM

## 2024-01-26 NOTE — Telephone Encounter (Signed)
 Have done PA waiting on insurance to determine

## 2024-01-28 ENCOUNTER — Encounter: Payer: Self-pay | Admitting: Urgent Care

## 2024-02-01 ENCOUNTER — Encounter: Payer: Self-pay | Admitting: Urgent Care

## 2024-02-18 ENCOUNTER — Encounter (INDEPENDENT_AMBULATORY_CARE_PROVIDER_SITE_OTHER): Payer: Self-pay

## 2024-02-18 ENCOUNTER — Ambulatory Visit
Admission: RE | Admit: 2024-02-18 | Discharge: 2024-02-18 | Disposition: A | Discharge status: Home / Self Care | Source: Ambulatory Visit | Attending: Family Medicine | Admitting: Family Medicine

## 2024-02-18 DIAGNOSIS — Z1231 Encounter for screening mammogram for malignant neoplasm of breast: Secondary | ICD-10-CM | POA: Diagnosis present

## 2024-02-22 ENCOUNTER — Other Ambulatory Visit (HOSPITAL_COMMUNITY)
Admission: RE | Admit: 2024-02-22 | Discharge: 2024-02-22 | Disposition: A | Discharge status: Home / Self Care | Source: Ambulatory Visit | Attending: Family Medicine | Admitting: Family Medicine

## 2024-02-22 ENCOUNTER — Ambulatory Visit: Payer: Self-pay | Admitting: Family Medicine

## 2024-02-22 ENCOUNTER — Encounter: Payer: Self-pay | Admitting: Family Medicine

## 2024-02-22 VITALS — BP 134/80 | HR 69 | Resp 16 | Ht 60.0 in | Wt 160.7 lb

## 2024-02-22 DIAGNOSIS — D229 Melanocytic nevi, unspecified: Secondary | ICD-10-CM | POA: Diagnosis present

## 2024-02-22 DIAGNOSIS — D485 Neoplasm of uncertain behavior of skin: Secondary | ICD-10-CM | POA: Diagnosis not present

## 2024-02-22 DIAGNOSIS — I10 Essential (primary) hypertension: Secondary | ICD-10-CM | POA: Diagnosis not present

## 2024-02-22 MED ORDER — HYDROCHLOROTHIAZIDE 12.5 MG PO TABS: 12.5000 mg | ORAL_TABLET | Freq: Every day | ORAL | 0 refills | Status: DC

## 2024-02-22 NOTE — Progress Notes (Signed)
 Name: Deanna Baker   MRN: [id number]    DOB: [birth year]   Date:02/22/2024       Progress Note  Subjective  Chief Complaint  Chief Complaint  Patient presents with   Medical Management of Chronic Issues   shave biopsy   HPI   HTN: she is now taking Norvasc 5 mg and hydrochlorothiazide 12.5 mg, tolerating medication well and bp is at goal. She brought her bp log and BP also controlled at home over the past week. No chest pain or palpitation  Skin lesion: atypical mole on left shoulder / posteriorly , present for a long time but irregular borders and she returned today for a shave biopsy  Patient Active Problem List   Diagnosis Date Noted   Osteopenia after menopause 12/03/2021   Gastroesophageal reflux disease    B12 deficiency 03/29/2018   Cognitive decline 01/02/2017   Smell or taste sensation disturbance 05/12/2016   History of colonic polyps    Benign neoplasm of transverse colon    Rectal polyp    Tenosynovitis of thumb 03/12/2016   Bilateral knee pain 03/12/2016   Stricture and stenosis of esophagus    Thickening of esophagus 08/03/2015   Benign essential HTN 05/01/2015   CFIDS (chronic fatigue and immune dysfunction syndrome) (HCC) 05/01/2015   Major depression, recurrent, chronic (HCC) 05/01/2015   Dyslipidemia 05/01/2015   Fibromyalgia syndrome 05/01/2015   Eczema intertrigo 05/01/2015   Migraine without aura and without status migrainosus, not intractable 05/01/2015   Excessive urination at night 05/01/2015   Dysmetabolic syndrome 05/01/2015   History of colonoscopy with polypectomy 05/01/2015   Central sleep apnea 05/01/2015   History of back surgery 05/01/2015   Thickened nails 05/01/2015    Past Surgical History:  Procedure Laterality Date   BACK SURGERY     BREAST BIOPSY Left 10/2012   +   BREAST EXCISIONAL BIOPSY Left 11/2012   + 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    COLONOSCOPY  2013   Dr. Tami Lin    COLONOSCOPY WITH PROPOFOL N/A 04/28/2016   Procedure: COLONOSCOPY WITH PROPOFOL;  Surgeon: Midge Minium, MD;  Location: [facility];  Service: Endoscopy;  Laterality: N/A;  CPAP   COLONOSCOPY WITH PROPOFOL N/A 04/10/2020   Procedure: COLONOSCOPY WITH PROPOFOL;  Surgeon: Midge Minium, MD;  Location: [facility];  Service: Endoscopy;  Laterality: N/A;   ESOPHAGOGASTRODUODENOSCOPY (EGD) WITH PROPOFOL N/A 10/23/2015   Procedure: ESOPHAGOGASTRODUODENOSCOPY (EGD) WITH PROPOFOL;  Surgeon: Midge Minium, MD;  Location: [facility] ENDOSCOPY;  Service: Endoscopy;  Laterality: N/A;   ESOPHAGOGASTRODUODENOSCOPY (EGD) WITH PROPOFOL N/A 04/10/2020   Procedure: ESOPHAGOGASTRODUODENOSCOPY (EGD) WITH PROPOFOL;  Surgeon: Midge Minium, MD;  Location: [facility] ENDOSCOPY;  Service: Endoscopy;  Laterality: N/A;   POLYPECTOMY  04/28/2016   Procedure: POLYPECTOMY;  Surgeon: Midge Minium, MD;  Location: [facility];  Service: Endoscopy;;   SHOULDER SURGERY Left 2009   TONSILLECTOMY  1965   TUBAL LIGATION  1982   UPPER GI ENDOSCOPY     Dr. Tami Lin    Family History  Problem Relation Age of Onset   Breast cancer Mother 68   Diabetes Mother    Gout Mother    Kidney failure Mother    Hypertension Mother    Congestive Heart Failure Mother    Stomach cancer Maternal Aunt    Brain cancer Paternal Uncle        x 2 mat uncles  Heart attack Maternal Grandmother    Hypertension Maternal Grandmother    Stroke Maternal Grandmother    Epilepsy Maternal Aunt     Social History   Tobacco Use   Smoking status: Former    Current packs/day: 0.00    Average packs/day: 0.5 packs/day for 4.0 years (2.0 ttl pk-yrs)    Types: Cigarettes    Start date: 11/24/1976    Quit date: 11/24/1980    Years since quitting: 43.2   Smokeless tobacco: Never  Substance Use Topics   Alcohol use: Yes    Alcohol/week: 0.0 standard drinks of alcohol    Comment: rarely, 1-2x/mo glass of wine      Current Outpatient Medications:    amLODipine (NORVASC) 5 MG tablet, Take 1 tablet (5 mg total) by mouth daily., Disp: 90 tablet, Rfl: 1   Bempedoic Acid-Ezetimibe (NEXLIZET) 180-10 MG TABS, Take 1 tablet by mouth daily at 12 noon., Disp: 90 tablet, Rfl: 1   gabapentin (NEURONTIN) 100 MG capsule, Take 1 capsule (100 mg total) by mouth 2 (two) times daily., Disp: 180 capsule, Rfl: 1   gabapentin (NEURONTIN) 400 MG capsule, TAKE 1 CAPSULE BY MOUTH AT BEDTIME., Disp: 90 capsule, Rfl: 1   ibuprofen (ADVIL) 400 MG tablet, Take 400 mg by mouth every 6 (six) hours as needed., Disp: , Rfl:    Nerve Stimulator (STANDARD TENS) DEVI, 1 Units by Does not apply route daily., Disp: 1 Device, Rfl: 0   hydrochlorothiazide (HYDRODIURIL) 12.5 MG tablet, Take 1 tablet (12.5 mg total) by mouth daily., Disp: 90 tablet, Rfl: 0  Allergies  Allergen Reactions   Ace Inhibitors Swelling   Benicar [Olmesartan] Other (See Comments)    Chest Pressure and Allergic Reaction   Pregabalin     weight gain and blurred vision   Spironolactone     Chest pain    Venlafaxine     foggy minded    I personally reviewed active problem list, medication list, allergies with the patient/caregiver today.   ROS  Ten systems reviewed and is negative except as mentioned in HPI    Objective Physical Exam   Vitals:   02/22/24 1114  BP: 134/80  Pulse: 69  Resp: 16  SpO2: 100%  Weight: 160 lb 11.2 oz (72.9 kg)  Height: 5' (1.524 m)    Body mass index is 31.38 kg/m.  Recent Results (from the past 2160 hours)  Lipid panel     Status: Abnormal   Collection Time: 01/11/24  9:48 AM  Result Value Ref Range   Cholesterol 225 (H) <200 mg/dL   HDL 61 > OR = 50 mg/dL   Triglycerides 914 (H) <150 mg/dL   LDL Cholesterol (Calc) 132 (H) mg/dL (calc)    Comment: Reference range: <100 . Desirable range <100 mg/dL for primary prevention;   <70 mg/dL for patients with CHD or diabetic patients  with > or = 2 CHD risk  factors. Marland Kitchen LDL-C is now calculated using the Martin-Hopkins  calculation, which is a validated novel method providing  better accuracy than the Friedewald equation in the  estimation of LDL-C.  Horald Pollen et al. Lenox Ahr. 7829;562(13): 2061-2068  (http://education.[facility].com/faq/FAQ164)    Total CHOL/HDL Ratio 3.7 <5.0 (calc)   Non-HDL Cholesterol (Calc) 164 (H) <130 mg/dL (calc)    Comment: For patients with diabetes plus 1 major ASCVD risk  factor, treating to a non-HDL-C goal of <100 mg/dL  (LDL-C of <08 mg/dL) is considered a therapeutic  option.   CBC with Differential/Platelet  Status: None   Collection Time: 01/11/24  9:48 AM  Result Value Ref Range   WBC 5.3 3.8 - 10.8 Thousand/uL   RBC 4.51 3.80 - 5.10 Million/uL   Hemoglobin 13.3 11.7 - 15.5 g/dL   HCT 16.1 09.6 - 04.5 %   MCV 89.6 80.0 - 100.0 fL   MCH 29.5 27.0 - 33.0 pg   MCHC 32.9 32.0 - 36.0 g/dL    Comment: For adults, a slight decrease in the calculated MCHC value (in the range of 30 to 32 g/dL) is most likely not clinically significant; however, it should be interpreted with caution in correlation with other red cell parameters and the patient's clinical condition.    RDW 12.7 11.0 - 15.0 %   Platelets 223 140 - 400 Thousand/uL   MPV 10.8 7.5 - 12.5 fL   Neutro Abs 3,758 1,500 - 7,800 cells/uL   Absolute Lymphocytes 1,087 850 - 3,900 cells/uL   Absolute Monocytes 345 200 - 950 cells/uL   Eosinophils Absolute 101 15 - 500 cells/uL   Basophils Absolute 11 0 - 200 cells/uL   Neutrophils Relative % 70.9 %   Total Lymphocyte 20.5 %   Monocytes Relative 6.5 %   Eosinophils Relative 1.9 %   Basophils Relative 0.2 %  COMPLETE METABOLIC PANEL WITH GFR     Status: Abnormal   Collection Time: 01/11/24  9:48 AM  Result Value Ref Range   Glucose, Bld 104 (H) 65 - 99 mg/dL    Comment: .            Fasting reference interval . For someone without known diabetes, a glucose value between 100 and 125  mg/dL is consistent with prediabetes and should be confirmed with a follow-up test. .    BUN 11 7 - 25 mg/dL   Creat 4.09 8.11 - 9.14 mg/dL   eGFR 84 > OR = 60 NW/GNF/6.21H0   BUN/Creatinine Ratio SEE NOTE: 6 - 22 (calc)    Comment:    Not Reported: BUN and Creatinine are within    reference range. .    Sodium 143 135 - 146 mmol/L   Potassium 4.6 3.5 - 5.3 mmol/L   Chloride 105 98 - 110 mmol/L   CO2 30 20 - 32 mmol/L   Calcium 9.6 8.6 - 10.4 mg/dL   Total Protein 6.7 6.1 - 8.1 g/dL   Albumin 4.4 3.6 - 5.1 g/dL   Globulin 2.3 1.9 - 3.7 g/dL (calc)   AG Ratio 1.9 1.0 - 2.5 (calc)   Total Bilirubin 0.6 0.2 - 1.2 mg/dL   Alkaline phosphatase (APISO) 113 37 - 153 U/L   AST 16 10 - 35 U/L   ALT 14 6 - 29 U/L  B12 and Folate Panel     Status: None   Collection Time: 01/11/24  9:48 AM  Result Value Ref Range   Vitamin B-12 425 200 - 1,100 pg/mL   Folate 10.6 ng/mL    Comment:                            Reference Range                            Low:           <3.4  Borderline:    3.4-5.4                            Normal:        >5.4 .   VITAMIN D 25 Hydroxy (Vit-D Deficiency, Fractures)     Status: Abnormal   Collection Time: 01/11/24  9:48 AM  Result Value Ref Range   Vit D, 25-Hydroxy 29 (L) 30 - 100 ng/mL    Comment: Vitamin D Status         25-OH Vitamin D: . Deficiency:                    <20 ng/mL Insufficiency:             20 - 29 ng/mL Optimal:                 > or = 30 ng/mL . For 25-OH Vitamin D testing on patients on  D2-supplementation and patients for whom quantitation  of D2 and D3 fractions is required, the [facility](TM) 25-OH VIT D, (D2,D3), LC/MS/MS is recommended: order  code 16109 (patients >9yrs). . See Note 1 . Note 1 . For additional information, please refer to  http://education.[facility].com/faq/FAQ199  (This link is being provided for informational/ educational purposes only.)   Hemoglobin A1c      Status: None   Collection Time: 01/11/24  9:48 AM  Result Value Ref Range   Hgb A1c MFr Bld 5.6 <5.7 % of total Hgb    Comment: For the purpose of screening for the presence of diabetes: . <5.7%       Consistent with the absence of diabetes 5.7-6.4%    Consistent with increased risk for diabetes             (prediabetes) > or =6.5%  Consistent with diabetes . This assay result is consistent with a decreased risk of diabetes. . Currently, no consensus exists regarding use of hemoglobin A1c for diagnosis of diabetes in children. . According to American Diabetes Association (ADA) guidelines, hemoglobin A1c <7.0% represents optimal control in non-pregnant diabetic patients. Different metrics may apply to specific patient populations.  Standards of Medical Care in Diabetes(ADA). .    Mean Plasma Glucose 114 mg/dL   eAG (mmol/L) 6.3 mmol/L    Diabetic Foot Exam:     PHQ2/9:    02/22/2024   11:13 AM 01/25/2024   10:03 AM 11/16/2023    9:30 AM 07/13/2023   10:32 AM 01/02/2023    9:28 AM  Depression screen PHQ 2/9  Decreased Interest 0 0 0 0 1  Down, Depressed, Hopeless 0 0 0 0 1  PHQ - 2 Score 0 0 0 0 2  Altered sleeping 0 0 0 0 0  Tired, decreased energy 0 0 0 0 3  Change in appetite 0 0 0 0 0  Feeling bad or failure about yourself  0 0 0 0 1  Trouble concentrating 0 0 0 0 0  Moving slowly or fidgety/restless 0 0 0 0 1  Suicidal thoughts 0 0 0 0 0  PHQ-9 Score 0 0 0 0 7  Difficult doing work/chores Not difficult at all Not difficult at all Not difficult at all Not difficult at all Somewhat difficult    phq 9 is negative  Fall Risk:    11/16/2023    9:26 AM 07/13/2023   10:32 AM 01/02/2023    9:28 AM 01/02/2023    9:14  AM 12/05/2022    9:51 AM  Fall Risk   Falls in the past year? 0 0 1 1 1   Number falls in past yr: 0 0 0 0 0  Injury with Fall? 0 0 1 0 0  Risk for fall due to : No Fall Risks  History of fall(s) No Fall Risks No Fall Risks;History of fall(s)  Follow up  Falls prevention discussed;Education provided;Falls evaluation completed  Falls prevention discussed;Education provided;Falls evaluation completed Falls prevention discussed Falls prevention discussed;Education provided;Falls evaluation completed     Assessment & Plan  1. Benign essential HTN (Primary)  - EKG 12-Lead - normal sinus rhythm  - hydrochlorothiazide (HYDRODIURIL) 12.5 MG tablet; Take 1 tablet (12.5 mg total) by mouth daily.  Dispense: 90 tablet; Refill: 0  2. Atypical mole  Consent signed: YES  Procedure: Skin Mass Removal Location: left posterior shoulder  Equipment used: derma-blade, high temperature cautery, sterile scalpel, tweezers Anesthesia: 1% Lidocaine w/o Epinephrine  Cleaned and prepped: Betadine  After consent signed, are of skin prepped with betadine. Lidocaine w/o epinephrine injected into skin underneath skin mass. After properly numbed sterile equipment used to remove tag.  Specimen sent for pathology analysis. Instructed on proper care to allow for proper healing. F/U for nursing visit if needed.  - Surgical pathology

## 2024-02-26 LAB — SURGICAL PATHOLOGY

## 2024-02-29 ENCOUNTER — Encounter: Payer: Self-pay | Admitting: Family Medicine

## 2024-04-08 ENCOUNTER — Other Ambulatory Visit: Payer: Self-pay | Admitting: Family Medicine

## 2024-04-08 DIAGNOSIS — I10 Essential (primary) hypertension: Secondary | ICD-10-CM

## 2024-04-29 ENCOUNTER — Ambulatory Visit: Payer: Self-pay | Admitting: Family Medicine

## 2024-04-29 ENCOUNTER — Encounter: Payer: Self-pay | Admitting: Family Medicine

## 2024-04-29 VITALS — BP 136/84 | HR 82 | Resp 16 | Ht 60.0 in | Wt 158.9 lb

## 2024-04-29 DIAGNOSIS — M797 Fibromyalgia: Secondary | ICD-10-CM

## 2024-04-29 DIAGNOSIS — Z780 Asymptomatic menopausal state: Secondary | ICD-10-CM

## 2024-04-29 DIAGNOSIS — F338 Other recurrent depressive disorders: Secondary | ICD-10-CM | POA: Insufficient documentation

## 2024-04-29 DIAGNOSIS — K219 Gastro-esophageal reflux disease without esophagitis: Secondary | ICD-10-CM

## 2024-04-29 DIAGNOSIS — G4733 Obstructive sleep apnea (adult) (pediatric): Secondary | ICD-10-CM | POA: Diagnosis not present

## 2024-04-29 DIAGNOSIS — G9332 Myalgic encephalomyelitis/chronic fatigue syndrome: Secondary | ICD-10-CM

## 2024-04-29 DIAGNOSIS — M8580 Other specified disorders of bone density and structure, unspecified site: Secondary | ICD-10-CM

## 2024-04-29 DIAGNOSIS — I10 Essential (primary) hypertension: Secondary | ICD-10-CM | POA: Diagnosis not present

## 2024-04-29 DIAGNOSIS — E785 Hyperlipidemia, unspecified: Secondary | ICD-10-CM

## 2024-04-29 DIAGNOSIS — D8989 Other specified disorders involving the immune mechanism, not elsewhere classified: Secondary | ICD-10-CM

## 2024-04-29 DIAGNOSIS — E538 Deficiency of other specified B group vitamins: Secondary | ICD-10-CM

## 2024-04-29 MED ORDER — EZETIMIBE 10 MG PO TABS: 10.0000 mg | ORAL_TABLET | Freq: Every day | ORAL | 1 refills | Status: DC

## 2024-04-29 MED ORDER — AMLODIPINE BESYLATE 5 MG PO TABS: 5.0000 mg | ORAL_TABLET | Freq: Every day | ORAL | 1 refills | Status: DC

## 2024-04-29 MED ORDER — GABAPENTIN 100 MG PO CAPS: 100.0000 mg | ORAL_CAPSULE | Freq: Two times a day (BID) | ORAL | 1 refills | Status: AC

## 2024-04-29 MED ORDER — HYDROCHLOROTHIAZIDE 12.5 MG PO TABS: 12.5000 mg | ORAL_TABLET | Freq: Every day | ORAL | 1 refills | Status: DC

## 2024-04-29 NOTE — Progress Notes (Signed)
 Name: Deanna Baker   MRN: [id number]    DOB: 1961-02-16   Date:04/29/2024       Progress Note  Subjective  Chief Complaint  Chief Complaint  Patient presents with   Medical Management of Chronic Issues   Discussed the use of AI scribe software for clinical note transcription with the patient, who gave verbal consent to proceed.  History of Present Illness Deanna Baker is a 63 year old female with hypertension and fibromyalgia who presents for a six-month follow-up.  She manages her hypertension with amlodipine  5 mg and hydrochlorothiazide  12.5 mg. Her blood pressure is generally well-controlled, with readings typically between 117-120 mmHg, though she occasionally experiences spikes to 140 mmHg in the afternoons. She monitors her blood pressure less frequently due to consistently good readings.  Her fibromyalgia is currently manageable, with a pain level of 'two'. She experiences fluctuations in pain and chronic fatigue, with some days being more challenging. She takes gabapentin  100 mg twice daily, increasing to 400 mg during flare-ups, which helps manage her symptoms.  She has a history of GERD with a mild hiatal hernia, which she manages through dietary modifications. She has lost nearly three pounds since her last visit and engages in regular walking with her dog. No issues with swallowing, heartburn, or indigestion are reported.  Her past medical history includes obstructive sleep apnea, for which she previously used a CPAP machine but discontinued due to esophagitis. She reports sleeping well without the CPAP and does not experience nocturnal awakenings or gasping for air.  She has vasomotor rhinitis, which has improved, particularly with activities like brushing her teeth. She also experiences seasonal affective disorder, which was less severe this past January.  Her current medications include amlodipine , hydrochlorothiazide , gabapentin , and Zetia  (ezetimibe ) for cholesterol  management. She takes vitamin D  supplements, 1000 IU twice daily, to address a slight deficiency.  Her social history includes regular physical activity through walking and participation in a book club. She has a granddaughter working in [place], which she does not find concerning.    Patient Active Problem List   Diagnosis Date Noted   Osteopenia after menopause 12/03/2021   Gastroesophageal reflux disease    B12 deficiency 03/29/2018   Cognitive decline 01/02/2017   Smell or taste sensation disturbance 05/12/2016   History of colonic polyps    Benign neoplasm of transverse colon    Rectal polyp    Tenosynovitis of thumb 03/12/2016   Bilateral knee pain 03/12/2016   Stricture and stenosis of esophagus    Thickening of esophagus 08/03/2015   Benign essential HTN 05/01/2015   CFIDS (chronic fatigue and immune dysfunction syndrome) (HCC) 05/01/2015   Major depression, recurrent, chronic (HCC) 05/01/2015   Dyslipidemia 05/01/2015   Fibromyalgia syndrome 05/01/2015   Eczema intertrigo 05/01/2015   Migraine without aura and without status migrainosus, not intractable 05/01/2015   Excessive urination at night 05/01/2015   Dysmetabolic syndrome 05/01/2015   History of colonoscopy with polypectomy 05/01/2015   Central sleep apnea 05/01/2015   History of back surgery 05/01/2015   Thickened nails 05/01/2015    Past Surgical History:  Procedure Laterality Date   BACK SURGERY     BREAST BIOPSY Left 10/2012   +   BREAST EXCISIONAL BIOPSY Left 11/2012   + 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   COLONOSCOPY  2013   Dr. Hilarie Lovely    COLONOSCOPY WITH PROPOFOL  N/A  04/28/2016   Procedure: COLONOSCOPY WITH PROPOFOL ;  Surgeon: Marnee Sink, MD;  Location: [facility];  Service: Endoscopy;  Laterality: N/A;  CPAP   COLONOSCOPY WITH PROPOFOL  N/A 04/10/2020   Procedure: COLONOSCOPY WITH PROPOFOL ;  Surgeon: Marnee Sink, MD;  Location: [facility];  Service: Endoscopy;  Laterality: N/A;   ESOPHAGOGASTRODUODENOSCOPY (EGD) WITH PROPOFOL  N/A 10/23/2015   Procedure: ESOPHAGOGASTRODUODENOSCOPY (EGD) WITH PROPOFOL ;  Surgeon: Marnee Sink, MD;  Location: [facility];  Service: Endoscopy;  Laterality: N/A;   ESOPHAGOGASTRODUODENOSCOPY (EGD) WITH PROPOFOL  N/A 04/10/2020   Procedure: ESOPHAGOGASTRODUODENOSCOPY (EGD) WITH PROPOFOL ;  Surgeon: Marnee Sink, MD;  Location: [facility];  Service: Endoscopy;  Laterality: N/A;   POLYPECTOMY  04/28/2016   Procedure: POLYPECTOMY;  Surgeon: Marnee Sink, MD;  Location: [facility];  Service: Endoscopy;;   SHOULDER SURGERY Left 2009   TONSILLECTOMY  1965   TUBAL LIGATION  1982   UPPER GI ENDOSCOPY     Dr. Hilarie Lovely    Family History  Problem Relation Age of Onset   Breast cancer Mother 70   Diabetes Mother    Gout Mother    Kidney failure Mother    Hypertension Mother    Congestive Heart Failure Mother    Stomach cancer Maternal Aunt    Brain cancer Paternal Uncle        x 2 mat uncles   Heart attack Maternal Grandmother    Hypertension Maternal Grandmother    Stroke Maternal Grandmother    Epilepsy Maternal Aunt     Social History   Tobacco Use   Smoking status: Former    Current packs/day: 0.00    Average packs/day: 0.5 packs/day for 4.0 years (2.0 ttl pk-yrs)    Types: Cigarettes    Start date: 11/24/1976    Quit date: 11/24/1980    Years since quitting: 43.4   Smokeless tobacco: Never  Substance Use Topics   Alcohol use: Yes    Alcohol/week: 0.0 standard drinks of alcohol    Comment: rarely, 1-2x/mo glass of wine     Current Outpatient Medications:    amLODipine  (NORVASC ) 5 MG tablet, Take 1 tablet (5 mg total) by mouth daily., Disp: 90 tablet, Rfl: 1   ezetimibe  (ZETIA ) 10 MG tablet, Take 10 mg by mouth daily., Disp: , Rfl:    gabapentin  (NEURONTIN ) 100 MG capsule, Take 1 capsule (100 mg total) by mouth 2 (two) times daily., Disp:  180 capsule, Rfl: 1   gabapentin  (NEURONTIN ) 400 MG capsule, TAKE 1 CAPSULE BY MOUTH AT BEDTIME., Disp: 90 capsule, Rfl: 1   hydrochlorothiazide  (HYDRODIURIL ) 12.5 MG tablet, Take 1 tablet (12.5 mg total) by mouth daily., Disp: 90 tablet, Rfl: 0   ibuprofen (ADVIL) 400 MG tablet, Take 400 mg by mouth every 6 (six) hours as needed., Disp: , Rfl:    Nerve Stimulator (STANDARD TENS) DEVI, 1 Units by Does not apply route daily., Disp: 1 Device, Rfl: 0   Bempedoic Acid-Ezetimibe  (NEXLIZET ) 180-10 MG TABS, Take 1 tablet by mouth daily at 12 noon. (Patient not taking: Reported on 04/29/2024), Disp: 90 tablet, Rfl: 1  Allergies  Allergen Reactions   Ace Inhibitors Swelling   Benicar  [Olmesartan ] Other (See Comments)    Chest Pressure and Allergic Reaction   Pregabalin     weight gain and blurred vision   Spironolactone      Chest pain    Venlafaxine     foggy minded    I personally reviewed active problem list, medication list, allergies with the patient/caregiver today.  ROS  Ten systems reviewed and is negative except as mentioned in HPI   Objective Physical Exam Constitutional: Patient appears well-developed and well-nourished. Obese  No distress.  HEENT: head atraumatic, normocephalic, pupils equal and reactive to light, neck supple Cardiovascular: Normal rate, regular rhythm and normal heart sounds.  No murmur heard. No BLE edema. Pulmonary/Chest: Effort normal and breath sounds normal. No respiratory distress. Abdominal: Soft.  There is no tenderness. Psychiatric: Patient has a normal mood and affect. behavior is normal. Judgment and thought content normal.    Vitals:   04/29/24 0943  BP: 136/84  Pulse: 82  Resp: 16  SpO2: 99%  Weight: 158 lb 14.4 oz (72.1 kg)  Height: 5' (1.524 m)    Body mass index is 31.03 kg/m.  Recent Results (from the past 2160 hours)  Surgical pathology     Status: None   Collection Time: 02/22/24 11:53 AM  Result Value Ref Range   SURGICAL  PATHOLOGY      SURGICAL PATHOLOGY CASE: [id number] PATIENT: Loletta Ripple Surgical Pathology Report     Clinical History: atypical mole, going on for years on back (cm)     FINAL MICROSCOPIC DIAGNOSIS:  A.   SKIN, BACK, BIOPSY: DYSPLASTIC COMPOUND NEVUS WITH MODERATE ATYPIA, MARGINS FREE  COMMENT: Sections show moderately atypical nested melanocytes at the junction and in the superficial papillary dermis. Bridging is seen between rete ridges. One population of dermal melanocytes exhibits hyperpigmentation, consistent with a small combined nevus component. The margins are free in this shave biopsy.    GROSS DESCRIPTION:  A.  "Back", received in formalin is a 0.9 x 0.8 cm tan-gray skin shave with a 0.4 x 0.4 cm well-circumscribed, flat, and brown-gray lesion on the epidermis. The specimen is trisected and submitted entirely in block A1 (margin inked blue).  SMB 02/23/24  Final Diagnosis performed by Vida Gravely, MD.   Electronically signed 02/26/2024 Technica l component performed at [facility]. [facility], [address], [place], [place] [address].  Professional component performed at [facility]. [address], [address], [place], [place] [address].  Immunohistochemistry Technical component (if applicable) was performed at [facility]. [address], [address], [place], [place] [address].   IMMUNOHISTOCHEMISTRY DISCLAIMER (if applicable): Some of these immunohistochemical stains may have been developed and the performance characteristics determine by [facility]. Some may not have been cleared or approved by the U.S. Food and Drug Administration. The FDA has determined that such clearance or approval is not necessary. This test is used for clinical purposes. It should not be regarded as investigational or for research. This laboratory is certified under the Clinical Laboratory Improvement Amendments of  1988 (CLIA-88) as qualified to perform high complexity clinical lab oratory testing.  The controls stained appropriately.   IHC stains are performed on formalin fixed, paraffin embedded tissue using a 3,3"diaminobenzidine (DAB) chromogen and Leica Bond Autostainer System. The staining intensity of the nucleus is score manually and is reported as the percentage of tumor cell nuclei demonstrating specific nuclear staining. The specimens are fixed in 10% Neutral Formalin for at least 6 hours and up to 72hrs. These tests are validated on decalcified tissue. Results should be interpreted with caution given the possibility of false negative results on decalcified specimens. Antibody Clones are as follows ER-clone 32F, PR-clone 16, Ki67- clone MM1. Some of these immunohistochemical stains may have been developed and the performance characteristics determined by [facility].     PHQ2/9:  04/29/2024    9:39 AM 02/22/2024   11:13 AM 01/25/2024   10:03 AM 11/16/2023    9:30 AM 07/13/2023   10:32 AM  Depression screen PHQ 2/9  Decreased Interest 0 0 0 0 0  Down, Depressed, Hopeless 0 0 0 0 0  PHQ - 2 Score 0 0 0 0 0  Altered sleeping  0 0 0 0  Tired, decreased energy  0 0 0 0  Change in appetite  0 0 0 0  Feeling bad or failure about yourself   0 0 0 0  Trouble concentrating  0 0 0 0  Moving slowly or fidgety/restless  0 0 0 0  Suicidal thoughts  0 0 0 0  PHQ-9 Score  0 0 0 0  Difficult doing work/chores  Not difficult at all Not difficult at all Not difficult at all Not difficult at all    phq 9 is negative  Fall Risk:    04/29/2024    9:39 AM 11/16/2023    9:26 AM 07/13/2023   10:32 AM 01/02/2023    9:28 AM 01/02/2023    9:14 AM  Fall Risk   Falls in the past year? 0 0 0 1 1  Number falls in past yr: 0 0 0 0 0  Injury with Fall? 0 0 0 1 0  Risk for fall due to : No Fall Risks No Fall Risks  History of fall(s) No Fall Risks  Follow up Falls prevention discussed;Education  provided;Falls evaluation completed Falls prevention discussed;Education provided;Falls evaluation completed  Falls prevention discussed;Education provided;Falls evaluation completed Falls prevention discussed     Assessment & Plan Hypertension Hypertension is well-controlled with amlodipine  and hydrochlorothiazide . Occasional afternoon elevations without symptoms. - Prescribe amlodipine  5 mg daily for 90 days with one refill. - Prescribe hydrochlorothiazide  12.5 mg daily for 90 days with one refill. - Schedule follow-up in 6 months.  Fibromyalgia Fibromyalgia symptoms are mild with chronic fatigue and occasional flare-ups. Gabapentin  used as needed for flares. - Continue gabapentin  100 mg twice daily. - Use gabapentin  400 mg as needed for flare-ups. - Call if additional gabapentin  is needed.  Hyperlipidemia Hyperlipidemia managed with ezetimibe . LDL improved but not at target. Triglycerides borderline due to sugar intake. - Prescribe ezetimibe  10 mg daily for 90 days with one refill. - Advise reducing sugar intake, particularly in beverages.  Gastroesophageal Reflux Disease (GERD) with Hiatal Hernia GERD well-managed with lifestyle modifications. No current symptoms. - Continue lifestyle modifications for GERD management.  Obstructive Sleep Apnea No current symptoms of sleep apnea. Previously could not tolerate CPAP due to esophagitis.  Vasomotor Rhinitis Symptoms have improved. Previously experienced rhinorrhea and gagging, now better.  Seasonal Affective Disorder Not currently problematic. January was better than previous years.  General Health Maintenance Taking vitamin D  supplements for slightly low levels. Engages in regular physical activity. - Continue vitamin D  supplementation, 1000 IU twice daily. - Encourage continued physical activity.  Follow-up Routine follow-up scheduled to monitor conditions and medication efficacy. - Schedule follow-up appointment in  December.

## 2024-10-28 ENCOUNTER — Ambulatory Visit: Admitting: Family Medicine

## 2024-11-17 ENCOUNTER — Other Ambulatory Visit: Payer: Self-pay | Admitting: Family Medicine

## 2024-11-17 DIAGNOSIS — I10 Essential (primary) hypertension: Secondary | ICD-10-CM

## 2024-11-18 NOTE — Telephone Encounter (Signed)
 Requested Prescriptions  Pending Prescriptions Disp Refills   hydrochlorothiazide  (HYDRODIURIL ) 12.5 MG tablet [Pharmacy Med Name: HYDROCHLOROTHIAZIDE  12.5 MG TB] 90 tablet 1    Sig: TAKE 1 TABLET BY MOUTH EVERY DAY     Cardiovascular: Diuretics - Thiazide Failed - 11/18/2024  3:11 PM      Failed - Cr in normal range and within 180 days    Creat  Date Value Ref Range Status  01/11/2024 0.79 0.50 - 1.05 mg/dL Final         Failed - K in normal range and within 180 days    Potassium  Date Value Ref Range Status  01/11/2024 4.6 3.5 - 5.3 mmol/L Final  02/20/2015 3.9 mmol/L Final    Comment:    3.5-5.1 NOTE: New Reference Range  01/30/15          Failed - Na in normal range and within 180 days    Sodium  Date Value Ref Range Status  01/11/2024 143 135 - 146 mmol/L Final  07/09/2016 144 134 - 144 mmol/L Final  02/20/2015 136 mmol/L Final    Comment:    135-145 NOTE: New Reference Range  01/30/15          Failed - Valid encounter within last 6 months    Recent Outpatient Visits           6 months ago Benign essential HTN    [facility] Glenard Mire, MD   9 months ago Benign essential HTN   [facility] Glenard Mire, MD   9 months ago Well adult exam   [facility] Glenard Mire, MD       Future Appointments             In 2 months Sowles, Krichna, MD [facility] [place]            Passed - Last BP in normal range    BP Readings from Last 1 Encounters:  04/29/24 136/84

## 2024-11-24 ENCOUNTER — Other Ambulatory Visit: Payer: Self-pay | Admitting: Family Medicine

## 2024-11-24 DIAGNOSIS — I10 Essential (primary) hypertension: Secondary | ICD-10-CM

## 2024-11-25 NOTE — Telephone Encounter (Signed)
 Requested Prescriptions  Pending Prescriptions Disp Refills   amLODipine  (NORVASC ) 5 MG tablet [Pharmacy Med Name: AMLODIPINE  BESYLATE 5 MG TAB] 90 tablet 0    Sig: TAKE 1 TABLET (5 MG TOTAL) BY MOUTH DAILY.     Cardiovascular: Calcium Channel Blockers 2 Failed - 11/25/2024 11:25 AM      Failed - Valid encounter within last 6 months    Recent Outpatient Visits           7 months ago Benign essential HTN   [facility] Glenard Mire, MD   9 months ago Benign essential HTN   [facility] Glenard Mire, MD   10 months ago Well adult exam   [facility] Sowles, Krichna, MD       Future Appointments             In 2 months Sowles, Krichna, MD [facility] [place]            Passed - Last BP in normal range    BP Readings from Last 1 Encounters:  04/29/24 136/84         Passed - Last Heart Rate in normal range    Pulse Readings from Last 1 Encounters:  04/29/24 82

## 2024-12-09 ENCOUNTER — Other Ambulatory Visit: Payer: Self-pay | Admitting: Family Medicine

## 2024-12-09 NOTE — Telephone Encounter (Signed)
 Requested Prescriptions  Pending Prescriptions Disp Refills   ezetimibe  (ZETIA ) 10 MG tablet [Pharmacy Med Name: EZETIMIBE  10 MG TABLET] 90 tablet 0    Sig: TAKE 1 TABLET BY MOUTH EVERY DAY     Cardiovascular:  Antilipid - Sterol Transport Inhibitors Failed - 12/09/2024  1:02 PM      Failed - Lipid Panel in normal range within the last 12 months    Cholesterol, Total  Date Value Ref Range Status  07/09/2016 212 (H) 100 - 199 mg/dL Final   Cholesterol  Date Value Ref Range Status  01/11/2024 225 (H) <200 mg/dL Final   LDL Cholesterol (Calc)  Date Value Ref Range Status  01/11/2024 132 (H) mg/dL (calc) Final    Comment:    Reference range: <100 . Desirable range <100 mg/dL for primary prevention;   <70 mg/dL for patients with CHD or diabetic patients  with > or = 2 CHD risk factors. SABRA LDL-C is now calculated using the Martin-Hopkins  calculation, which is a validated novel method providing  better accuracy than the Friedewald equation in the  estimation of LDL-C.  Gladis APPLETHWAITE et al. SANDREA. 7986;689(80): 2061-2068  (http://education.[facility].com/faq/FAQ164)    HDL  Date Value Ref Range Status  01/11/2024 61 > OR = 50 mg/dL Final  91/83/7982 59 >60 mg/dL Final   Triglycerides  Date Value Ref Range Status  01/11/2024 180 (H) <150 mg/dL Final         Passed - AST in normal range and within 360 days    AST  Date Value Ref Range Status  01/11/2024 16 10 - 35 U/L Final   SGOT(AST)  Date Value Ref Range Status  02/20/2015 18 U/L Final    Comment:    15-41 NOTE: New Reference Range  01/30/15          Passed - ALT in normal range and within 360 days    ALT  Date Value Ref Range Status  01/11/2024 14 6 - 29 U/L Final   SGPT (ALT)  Date Value Ref Range Status  02/20/2015 14 U/L Final    Comment:    14-54 NOTE: New Reference Range  01/30/15          Passed - Patient is not pregnant      Passed - Valid encounter within last 12 months    Recent  Outpatient Visits           7 months ago Benign essential HTN    [facility] Glenard Mire, MD   9 months ago Benign essential HTN   [facility] Glenard Mire, MD   10 months ago Well adult exam   [facility] Glenard Mire, MD       Future Appointments             In 1 month Glenard, Krichna, MD [facility] [place]

## 2025-01-26 ENCOUNTER — Encounter: Payer: Self-pay | Admitting: Family Medicine
